# Patient Record
Sex: Female | Born: 1965 | Race: Black or African American | Hispanic: No | Smoking: Never smoker
Health system: Southern US, Community
[De-identification: ages and names within clinical notes are randomized; demographics above are authoritative.]

## PROBLEM LIST (undated history)

## (undated) DIAGNOSIS — R51 Headache: Secondary | ICD-10-CM

## (undated) DIAGNOSIS — I509 Heart failure, unspecified: Secondary | ICD-10-CM

## (undated) DIAGNOSIS — M1990 Unspecified osteoarthritis, unspecified site: Secondary | ICD-10-CM

## (undated) DIAGNOSIS — I10 Essential (primary) hypertension: Secondary | ICD-10-CM

## (undated) DIAGNOSIS — Z8619 Personal history of other infectious and parasitic diseases: Secondary | ICD-10-CM

## (undated) DIAGNOSIS — I82409 Acute embolism and thrombosis of unspecified deep veins of unspecified lower extremity: Secondary | ICD-10-CM

## (undated) HISTORY — DX: Headache: R51

## (undated) HISTORY — DX: Heart failure, unspecified: I50.9

## (undated) HISTORY — DX: Essential (primary) hypertension: I10

## (undated) HISTORY — DX: Acute embolism and thrombosis of unspecified deep veins of unspecified lower extremity: I82.409

## (undated) HISTORY — DX: Personal history of other infectious and parasitic diseases: Z86.19

## (undated) HISTORY — DX: Morbid (severe) obesity due to excess calories: E66.01

## (undated) HISTORY — DX: Unspecified osteoarthritis, unspecified site: M19.90

---
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
## 2007-05-09 ENCOUNTER — Emergency Department (HOSPITAL_COMMUNITY): Admission: EM | Admit: 2007-05-09 | Discharge: 2007-05-09 | Payer: Self-pay | Admitting: Emergency Medicine

## 2008-04-25 ENCOUNTER — Emergency Department (HOSPITAL_COMMUNITY): Admission: EM | Admit: 2008-04-25 | Discharge: 2008-04-26 | Payer: Self-pay | Admitting: Emergency Medicine

## 2008-10-11 ENCOUNTER — Inpatient Hospital Stay (HOSPITAL_COMMUNITY): Admission: EM | Admit: 2008-10-11 | Discharge: 2008-10-14 | Payer: Self-pay | Admitting: Emergency Medicine

## 2008-10-12 ENCOUNTER — Encounter (INDEPENDENT_AMBULATORY_CARE_PROVIDER_SITE_OTHER): Payer: Self-pay | Admitting: Internal Medicine

## 2008-10-12 ENCOUNTER — Ambulatory Visit: Payer: Self-pay | Admitting: *Deleted

## 2008-10-13 ENCOUNTER — Encounter (INDEPENDENT_AMBULATORY_CARE_PROVIDER_SITE_OTHER): Payer: Self-pay | Admitting: Internal Medicine

## 2009-10-29 ENCOUNTER — Encounter: Admission: RE | Admit: 2009-10-29 | Discharge: 2009-10-29 | Payer: Self-pay | Admitting: Internal Medicine

## 2010-09-25 LAB — HM PAP SMEAR

## 2010-10-15 ENCOUNTER — Encounter: Payer: Self-pay | Admitting: Neurosurgery

## 2010-10-16 ENCOUNTER — Encounter: Payer: Self-pay | Admitting: Physical Medicine and Rehabilitation

## 2011-01-09 LAB — LIPID PANEL
LDL Cholesterol: 104 mg/dL — ABNORMAL HIGH (ref 0–99)
Total CHOL/HDL Ratio: 4 RATIO
Triglycerides: 68 mg/dL (ref <150)
VLDL: 14 mg/dL (ref 0–40)

## 2011-01-09 LAB — DIFFERENTIAL
Basophils Absolute: 0.1 10*3/uL (ref 0.0–0.1)
Basophils Relative: 1 % (ref 0–1)
Eosinophils Absolute: 0 10*3/uL (ref 0.0–0.7)
Lymphs Abs: 1.8 10*3/uL (ref 0.7–4.0)
Monocytes Relative: 10 % (ref 3–12)
Monocytes Relative: 13 % — ABNORMAL HIGH (ref 3–12)
Neutro Abs: 2.7 10*3/uL (ref 1.7–7.7)
Neutro Abs: 3.7 10*3/uL (ref 1.7–7.7)
Neutrophils Relative %: 51 % (ref 43–77)
Neutrophils Relative %: 55 % (ref 43–77)

## 2011-01-09 LAB — CK: Total CK: 297 U/L — ABNORMAL HIGH (ref 7–177)

## 2011-01-09 LAB — CBC
MCHC: 33.5 g/dL (ref 30.0–36.0)
MCV: 85.5 fL (ref 78.0–100.0)
Platelets: 336 10*3/uL (ref 150–400)
RBC: 3.88 MIL/uL (ref 3.87–5.11)
RDW: 15 % (ref 11.5–15.5)
WBC: 5.3 10*3/uL (ref 4.0–10.5)

## 2011-01-09 LAB — BASIC METABOLIC PANEL
BUN: 6 mg/dL (ref 6–23)
CO2: 25 mEq/L (ref 19–32)
Chloride: 104 mEq/L (ref 96–112)
Chloride: 105 mEq/L (ref 96–112)
Creatinine, Ser: 0.76 mg/dL (ref 0.4–1.2)
GFR calc Af Amer: >60 mL/min (ref >60)
Glucose, Bld: 96 mg/dL (ref 70–99)
Potassium: 3.6 mEq/L (ref 3.5–5.1)
Sodium: 139 mEq/L (ref 135–145)

## 2011-01-09 LAB — POCT CARDIAC MARKERS
CKMB, poc: 3.4 ng/mL (ref 1.0–8.0)
CKMB, poc: 4.6 ng/mL (ref 1.0–8.0)
Myoglobin, poc: 137 ng/mL (ref 12–200)
Troponin i, poc: <0.05 ng/mL (ref 0.00–0.09)

## 2011-01-09 LAB — CARDIAC PANEL(CRET KIN+CKTOT+MB+TROPI)
CK, MB: 3.9 ng/mL (ref 0.3–4.0)
Total CK: 477 U/L — ABNORMAL HIGH (ref 7–177)

## 2011-01-09 LAB — HOMOCYSTEINE: Homocysteine: 10 umol/L (ref 4.0–15.4)

## 2011-01-09 LAB — CK TOTAL AND CKMB (NOT AT ARMC)
CK, MB: 7.2 ng/mL — ABNORMAL HIGH (ref 0.3–4.0)
Total CK: 461 U/L — ABNORMAL HIGH (ref 7–177)

## 2011-01-09 LAB — POCT I-STAT, CHEM 8
Calcium, Ion: 1.16 mmol/L (ref 1.12–1.32)
Creatinine, Ser: 1 mg/dL (ref 0.4–1.2)
Glucose, Bld: 95 mg/dL (ref 70–99)
Hemoglobin: 12.2 g/dL (ref 12.0–15.0)
Potassium: 3.1 mEq/L — ABNORMAL LOW (ref 3.5–5.1)

## 2011-01-09 LAB — SEDIMENTATION RATE: Sed Rate: 60 mm/hr — ABNORMAL HIGH (ref 0–22)

## 2011-01-09 LAB — PROTIME-INR: Prothrombin Time: 13.7 seconds (ref 11.6–15.2)

## 2011-02-07 NOTE — Discharge Summary (Signed)
[name], [name]                 ACCOUNT NO.:  [id number]   MEDICAL RECORD NO.:  [id number]          PATIENT TYPE:  INP   LOCATION:  3016                         FACILITY:  [facility]   PHYSICIAN:  Beckey Rutter, MD  DATE OF BIRTH:  03-Oct-1965   DATE OF ADMISSION:  10/11/2008  DATE OF DISCHARGE:  10/14/2008                               DISCHARGE SUMMARY   PRIMARY CARE PHYSICIAN:  Unassigned to [facility].   CHIEF COMPLAINT:  Headache and inability to control urine.   HOSPITAL COURSE AND WORKING DIAGNOSIS:  Transient ischemic attack.   HOSPITAL PROCEDURES:  Homocysteine is 10.0.  CK is 297.  The last CMET  was showing sodium 138, potassium 3.6, chloride 104, bicarb 29, glucose  96, BUN is 6, and creatinine is 0.77.  Her TSH as of October 11, 2008 is  1.2.  Lipid profile showing total cholesterol is 157, triglyceride is  68, HDL 39, and LDL is 104.   Carotid duplex is showing no ICA stenosis.  Vertebral artery showing  antegrade activity.  Chest x-ray on October 11, 2008 was showing mild  cardiomegaly, no active lung disease.   CT head on October 11, 2008, was showing no acute intracranial  abnormalities.   A 2-D echo read on October 13, 2008, by Dr. Lucas Mallow, impression is overall  left ventricular systolic function was vigorous.  Left ventricular  ejection fraction was estimated, it ranged between 65%-75%.  Although,  no diagnostic left ventricular regional wall motion abnormality was  identified, this possibility cannot be completely excluded on the basis  of the study.  Left ventricular wall thickness was mildly to moderately  increased.  The left atrium was mildly dilated.   HOSPITAL COURSE STAY:  1. TIA and history of loss of urine was investigated as above by CT      head, the carotid duplex, lipid profile, and the 2-D echo which all      are negative.  The patient would benefit from MRI but secondary to      her morbid obesity, the MRI could not be done while she was in  the      hospital, the MRI would need to be done with open MRI.  I had a      lengthy discussion about the need for the MRI and the need to      follow up with her primary physician.  2. Hypertension.  The patient has blood pressure control with Norvasc      5 mg, hydrochlorothiazide, and clonidine p.r.n.  I will discharge      her with Norvasc and hydrochlorothiazide with the possibility of      adding ACE inhibitor as needed to further control blood pressure if      it continued to be uncontrolled like the way it was prior to her      presentation.  Prescription was given.  3. Morbid obesity.  The patient was counseled.  She was encouraged to      lose weight and she might be referred to a registered dietitian for  further counseling or Obesity Clinic as needed.   DISCHARGE DIAGNOSES:  1. Rule out transient ischemic attack.  2. Hypertension.  3. Morbid obesity.   DISCHARGE MEDICATIONS:  1. Aspirin 325 mg p.o. daily.  2. Norvasc 10 mg p.o. daily.  3. Hydrochlorothiazide 25 mg p.o. daily.  4. Niacin 100 mg p.o. b.i.d.   Prescription was given for the above medication for 4-month supply.   DISCHARGE PLAN:  The patient is discharged to follow up with her primary  physician as discussed with her.  The patient would need an MRI which  she agreed to do it as an outpatient since she is feeling very good  currently, and she is clinically stable.  The patient has a history of  urinary incontinence together with obesity.  I would be concerned about  normal pressure hydrocephalus with her and hence the need for the MRI.  Also, we would need the MRI to complete the workup for TIA.  Again, the  patient preferred to do the MRI as an outpatient, and she is aware and  agreeable to this discharge plan.      Beckey Rutter, MD  Electronically Signed     EME/MEDQ  D:  10/14/2008  T:  10/15/2008  Job:  [id number]

## 2011-02-07 NOTE — H&P (Signed)
NAMEMIKENA, MASONER                 ACCOUNT NO.:  [id number]   MEDICAL RECORD NO.:  [id number]          PATIENT TYPE:  INP   LOCATION:  3016                         FACILITY:  [facility]   PHYSICIAN:  Della Goo, M.D. DATE OF BIRTH:  1966/05/06   DATE OF ADMISSION:  10/11/2008  DATE OF DISCHARGE:                              HISTORY & PHYSICAL   PRIMARY CARE PHYSICIAN:  None.   CHIEF COMPLAINT:  Headache, loss of urine.   HISTORY OF PRESENT ILLNESS:  This is a 45 year old female who presents  to the emergency department with complaints of severe headaches for the  past 3-4 days along with loss of urine which occurred while she was at  work.  The patient reports having no control.  She denies having any  loss of consciousness or tremor associated with this.  She does report  having some lightheadedness along with headache that she has had for the  past few days.  She denies having any fevers, chills, chest pain,  shortness of breath, nausea, vomiting, diarrhea or constipation.   In the emergency department, the patient was evaluated and she was found  to have a blood pressure of 176/102.  A CT scan of the head was  performed which was found to be negative for any acute intracranial  abnormality.   PAST MEDICAL HISTORY:  1. Hypertension.  2. Chronic back pain.  3. Degenerative disk disease.   MEDICATIONS:  Hydrochlorothiazide and amitriptyline.   ALLERGIES:  NO KNOWN DRUG ALLERGIES.   SOCIAL HISTORY:  The patient is a nonsmoker, nondrinker.   FAMILY HISTORY:  Positive for hypertensive disease.   REVIEW OF SYSTEMS:  Pertinents are mentioned above.   PHYSICAL EXAMINATION:  GENERAL:  This is a morbidly obese 45 year old  female in mild discomfort but no acute distress.  VITAL SIGNS:  Temperature 98.1, blood pressure 176/102, heart rate 102,  respirations 22.  O2 saturations 100% on 2 liters nasal cannula oxygen.  HEENT:  Normocephalic, atraumatic.  There is no scleral  icterus or  conjunctival erythema.  Pupils are equally round and reactive to light.  Extraocular movements are intact.  Nares are patent bilaterally.  Oropharynx is clear.  No exudates or erythema.  NECK:  Supple.  Full range of motion.  No thyromegaly, adenopathy or  jugulovenous distention.  CARDIOVASCULAR:  Regular rate and rhythm.  No  murmurs, gallops or rubs.  LUNGS:  Clear to auscultation bilaterally.  ABDOMEN:  Positive bowel sounds.  Soft, nontender, nondistended.  EXTREMITIES:  Without cyanosis, clubbing or edema.  NEUROLOGIC:  Alert and oriented x3.  Cranial nerves are intact.  Motor  and sensory function are also intact.  Cerebellar function intact and  deep tendon reflexes 2/4 throughout.   LABORATORY STUDIES:  White blood cell count 6.7, hemoglobin 11.4,  hematocrit 34.0, MCV 85.5, platelets 336, neutrophils 55%, lymphocytes  33%.  Chemistry with a sodium of 143, potassium 3.1, chloride 107,  bicarb 25, BUN 11, creatinine 1.0, glucose 95.  Protime 13.7, INR 1.0  and PTT 27.  Alcohol level less than 5.0.  Cardiac markers with a  myoglobin of 161, CK-MB 4.6 and troponin less than 0.05.  Erythrocyte  sedimentation rate 60.  Chest x-ray:  Mild cardiomegaly, but no active  lung disease changes present.  CT scan results negative for any acute  intracranial abnormality.   ASSESSMENT:  A 45 year old female being admitted with;  1. Transient ischemic attack.  2. Uncontrolled hypertension.  3. Hypokalemia secondary to medications.  4. Morbid obesity.   PLAN:  The patient will be admitted to a telemetry area and cardiac  enzymes will be performed.  A TIA workup will be started.  A urinalysis  will be requested.  The patient's potassium will be repleted and her  blood pressure medication will be further adjusted.  The patient will be  placed on DVT and GI prophylaxis.  Further workup will ensue pending  results of her studies and her clinical course.      Della Goo,  M.D.  Electronically Signed     HJ/MEDQ  D:  10/12/2008  T:  10/12/2008  Job:  [id number]

## 2011-07-10 LAB — URINE MICROSCOPIC-ADD ON

## 2011-07-10 LAB — URINALYSIS, ROUTINE W REFLEX MICROSCOPIC
Bilirubin Urine: NEGATIVE
Nitrite: POSITIVE — AB
Protein, ur: 100 — AB
Specific Gravity, Urine: 1.015
Urobilinogen, UA: 0.2

## 2012-07-22 ENCOUNTER — Ambulatory Visit (INDEPENDENT_AMBULATORY_CARE_PROVIDER_SITE_OTHER): Payer: 59 | Admitting: Internal Medicine

## 2012-07-22 ENCOUNTER — Encounter: Payer: Self-pay | Admitting: Internal Medicine

## 2012-07-22 VITALS — BP 204/112 | HR 92 | Temp 98.2°F | Ht 69.0 in | Wt 322.0 lb

## 2012-07-22 DIAGNOSIS — M25569 Pain in unspecified knee: Secondary | ICD-10-CM

## 2012-07-22 DIAGNOSIS — M545 Other chronic pain: Secondary | ICD-10-CM | POA: Insufficient documentation

## 2012-07-22 DIAGNOSIS — M170 Bilateral primary osteoarthritis of knee: Secondary | ICD-10-CM | POA: Insufficient documentation

## 2012-07-22 DIAGNOSIS — M25561 Pain in right knee: Secondary | ICD-10-CM | POA: Insufficient documentation

## 2012-07-22 DIAGNOSIS — M25579 Pain in unspecified ankle and joints of unspecified foot: Secondary | ICD-10-CM

## 2012-07-22 DIAGNOSIS — M1990 Unspecified osteoarthritis, unspecified site: Secondary | ICD-10-CM

## 2012-07-22 DIAGNOSIS — I10 Essential (primary) hypertension: Secondary | ICD-10-CM

## 2012-07-22 DIAGNOSIS — M25572 Pain in left ankle and joints of left foot: Secondary | ICD-10-CM | POA: Insufficient documentation

## 2012-07-22 DIAGNOSIS — R3589 Other polyuria: Secondary | ICD-10-CM | POA: Insufficient documentation

## 2012-07-22 DIAGNOSIS — R358 Other polyuria: Secondary | ICD-10-CM

## 2012-07-22 DIAGNOSIS — G8929 Other chronic pain: Secondary | ICD-10-CM

## 2012-07-22 LAB — CBC WITH DIFFERENTIAL/PLATELET
Basophils Relative: 0.5 % (ref 0.0–3.0)
Eosinophils Absolute: 0 10*3/uL (ref 0.0–0.7)
Eosinophils Relative: 0.6 % (ref 0.0–5.0)
Hemoglobin: 12.6 g/dL (ref 12.0–15.0)
Lymphocytes Relative: 48.5 % — ABNORMAL HIGH (ref 12.0–46.0)
MCHC: 32.7 g/dL (ref 30.0–36.0)
Monocytes Relative: 11 % (ref 3.0–12.0)
Neutro Abs: 1.9 10*3/uL (ref 1.4–7.7)
RBC: 4.28 Mil/uL (ref 3.87–5.11)

## 2012-07-22 LAB — BASIC METABOLIC PANEL
CO2: 28 mEq/L (ref 19–32)
Calcium: 8.9 mg/dL (ref 8.4–10.5)
Sodium: 140 mEq/L (ref 135–145)

## 2012-07-22 LAB — TSH: TSH: 1.6 u[IU]/mL (ref 0.35–5.50)

## 2012-07-22 LAB — HEPATIC FUNCTION PANEL
ALT: 10 U/L (ref 0–35)
AST: 17 U/L (ref 0–37)
Alkaline Phosphatase: 81 U/L (ref 39–117)
Bilirubin, Direct: 0.1 mg/dL (ref 0.0–0.3)
Total Protein: 7.5 g/dL (ref 6.0–8.3)

## 2012-07-22 LAB — T4, FREE: Free T4: 0.92 ng/dL (ref 0.60–1.60)

## 2012-07-22 LAB — LIPID PANEL: Total CHOL/HDL Ratio: 3

## 2012-07-22 MED ORDER — LABETALOL HCL 300 MG PO TABS: 300.0000 mg | ORAL_TABLET | Freq: Two times a day (BID) | ORAL | Status: DC

## 2012-07-22 MED ORDER — HYDRALAZINE HCL 25 MG PO TABS: 25.0000 mg | ORAL_TABLET | Freq: Three times a day (TID) | ORAL | Status: DC

## 2012-07-22 MED ORDER — AMLODIPINE-OLMESARTAN 5-20 MG PO TABS: 1.0000 | ORAL_TABLET | Freq: Every day | ORAL | Status: DC

## 2012-07-22 MED ORDER — POTASSIUM CHLORIDE CRYS ER 20 MEQ PO TBCR: 20.0000 meq | EXTENDED_RELEASE_TABLET | Freq: Every day | ORAL | Status: DC

## 2012-07-22 MED ORDER — FUROSEMIDE 40 MG PO TABS: 40.0000 mg | ORAL_TABLET | Freq: Every day | ORAL | Status: DC

## 2012-07-22 MED ORDER — MORPHINE SULFATE ER 30 MG PO TBCR: 30.0000 mg | EXTENDED_RELEASE_TABLET | Freq: Two times a day (BID) | ORAL | Status: DC

## 2012-07-22 NOTE — Assessment & Plan Note (Signed)
Rule out diabetes.  Check A1c.

## 2012-07-22 NOTE — Assessment & Plan Note (Signed)
Poor control despite multiple medications. Decrease hydralazine to 25 mg 3 times daily. This is the likely agent causing symptoms of significant fatigue. Increase labetalol to 300 mg twice daily. Continue furosemide. Add Azor 20/5 mg once daily.  Rule out secondary hypertension.  Obtain renal artery doppler.  BP: 204/112 mmHg

## 2012-07-22 NOTE — Assessment & Plan Note (Signed)
Patient has chronic low back pain secondary to degenerative disc disease. This was managed by her primary care physician with oxycodone. Refer to pain management specialist. I would like patient to be weaned off her narcotics.  Switch to oramorph sr 30 mg twice daily for now.

## 2012-07-22 NOTE — Patient Instructions (Signed)
Do not start Azor until our office contacts you re: blood test results.

## 2012-07-22 NOTE — Assessment & Plan Note (Signed)
Patient tried to pursue weight loss surgery in the past surgeries not covered by insurance company. Discussed weight loss strategies. Goal weight loss - 10 % of body weight over next 6 months.

## 2012-07-22 NOTE — Progress Notes (Signed)
Subjective:    Patient ID: [name], female    DOB: August 14, 1966, 46 y.o.   MRN: [id number]  HPI  46 year old white female with history of uncontrolled hypertension, morbid obesity and chronic low back pain to establish. She was previously followed by a primary care physician in [facility]. Patient reports difficulty controlling her blood pressures. She is currently on multiple medications including hydralazine, labetalol and furosemide.  She denies history of diabetes or coronary artery disease. Patient does report she may have had a mild stroke in 2010.  Patient also complains of chronic degenerative joint disease of her right knee and left ankle. She was diagnosed with significant arthritis in her left ankle one year ago at a walk-in clinic. She has chronic history of left foot swelling. She has pain in her medial aspect of left ankle.  She also has history of chronic low back pain. Previous MRI showed degenerative disc disease at L4 and L5. She also is noted to have mild spinal stenosis. Her primary care physician has been managing her pain with oxycodone 30 mg every 4-6 hours as needed. Patient reports she has developed a tolerance to her pain medications.  She has history of morbid obesity. She try to pursue weight loss surgery but was not covered by her current insurance.    Review of Systems  Constitutional: Negative for activity change, appetite change and unexpected weight change.  Eyes: Negative for visual disturbance.  Respiratory: Negative for cough.  Occasional dyspnea with exertion Cardiovascular: Negative for chest pain.  Genitourinary: Negative for difficulty urinating.  Neurological: Negative for headaches.  Gastrointestinal: Negative for abdominal pain, heartburn melena or hematochezia Psych: Negative for depression or anxiety Endo:  Positive for polyuria  Past Medical History  Diagnosis Date  . History of chicken pox   . Headache   . Hypertension   .  Osteoarthritis   . Morbid obesity     History   Social History  . Marital Status: Divorced    Spouse Name: N/A    Number of Children: N/A  . 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 Smoking status: Never Smoker   . Smokeless tobacco: Never Used  . Alcohol Use: No  . Drug Use: No  . Sexually Active: Not on file   Other Topics Concern  . Not on file   Social History Narrative   Works at Naval architect for [employer]    Past Surgical History  Procedure Date  . Tubal ligation 1985    Family History  Problem Relation Age of Onset  . Arthritis Mother   . Prostate cancer Maternal Grandfather     breast, prostate  . Stroke    . Hypertension Mother     No Known Allergies  Current Outpatient Prescriptions on File Prior to Visit  Medication Sig Dispense Refill  . furosemide (LASIX) 40 MG tablet Take 1 tablet (40 mg total) by mouth daily.  90 tablet  0  . hydrALAZINE (APRESOLINE) 25 MG tablet Take 1 tablet (25 mg total) by mouth 3 (three) times daily.  90 tablet  1  . labetalol (NORMODYNE) 300 MG tablet Take 1 tablet (300 mg total) by mouth 2 (two) times daily.  60 tablet  1  . potassium chloride SA (K-DUR,KLOR-CON) 20 MEQ tablet Take 1 tablet (20 mEq total) by mouth daily.  90 tablet  0  . amLODipine-olmesartan (AZOR) 5-20 MG per  tablet Take 1 tablet by mouth daily.  30 tablet  0    BP 204/112  Pulse 92  Temp 98.2 F (36.8 C) (Oral)  Ht 5\' 9"  (1.753 m)  Wt 322 lb (146.058 kg)  BMI 47.55 kg/m2  LMP 05/28/2012        Objective:   Physical Exam  Constitutional: She is oriented to person, place, and time.       Pleasant, morbidly obese 46 year old African American female  HENT:  Head: Normocephalic and atraumatic.  Right Ear: External ear normal.  Left Ear: External ear normal.  Mouth/Throat: Oropharynx is clear and moist.       Hearing is grossly normal  Eyes: EOM are normal. Pupils  are equal, round, and reactive to light.  Neck: Neck supple. No thyromegaly present.       No carotid bruit  Cardiovascular: Normal rate, regular rhythm, normal heart sounds and intact distal pulses.   No murmur heard. Pulmonary/Chest: Effort normal and breath sounds normal. She has no wheezes.  Abdominal: Soft. Bowel sounds are normal. She exhibits no mass.       Abdominal obesity  Musculoskeletal:       Discomfort with flexion and extension of right knee, left ankle swelling, pain with plantar flexion and dorsiflexion of the left ankle. Tenderness of left ankle near medial malleolus  Lymphadenopathy:    She has no cervical adenopathy.  Neurological: She is alert and oriented to person, place, and time. No cranial nerve deficit.  Skin: Skin is warm and dry.  Psychiatric: She has a normal mood and affect. Her behavior is normal.          Assessment & Plan:

## 2012-07-22 NOTE — Assessment & Plan Note (Signed)
Patient has history of degenerative joint disease of her right knee and left ankle. Her symptoms are progressively worsening. She has chronic swelling of her left ankle. Refer to orthopedics for further evaluation and treatment. I discussed the importance of weight loss.

## 2012-07-29 ENCOUNTER — Encounter: Payer: Self-pay | Admitting: Physical Medicine and Rehabilitation

## 2012-08-06 ENCOUNTER — Encounter: Payer: Self-pay | Admitting: Internal Medicine

## 2012-08-06 ENCOUNTER — Ambulatory Visit (INDEPENDENT_AMBULATORY_CARE_PROVIDER_SITE_OTHER): Payer: 59 | Admitting: Internal Medicine

## 2012-08-06 VITALS — BP 162/90 | HR 84 | Temp 99.7°F | Wt 317.0 lb

## 2012-08-06 DIAGNOSIS — M545 Low back pain, unspecified: Secondary | ICD-10-CM

## 2012-08-06 DIAGNOSIS — G8929 Other chronic pain: Secondary | ICD-10-CM

## 2012-08-06 DIAGNOSIS — I10 Essential (primary) hypertension: Secondary | ICD-10-CM

## 2012-08-06 LAB — BASIC METABOLIC PANEL
Calcium: 9.1 mg/dL (ref 8.4–10.5)
Creatinine, Ser: 0.8 mg/dL (ref 0.4–1.2)
GFR: 97.4 mL/min (ref >60.00)
Sodium: 137 mEq/L (ref 135–145)

## 2012-08-06 MED ORDER — OXYCODONE HCL 30 MG PO TABS: 30.0000 mg | ORAL_TABLET | Freq: Three times a day (TID) | ORAL | Status: DC | PRN

## 2012-08-06 MED ORDER — AMLODIPINE-OLMESARTAN 10-40 MG PO TABS: 1.0000 | ORAL_TABLET | Freq: Every day | ORAL | Status: DC

## 2012-08-06 NOTE — Progress Notes (Signed)
Subjective:    Patient ID: [name], female    DOB: 1966/05/04, 46 y.o.   MRN: [id number]  HPI  46 year old African American female with severe hypertension and chronic low back pain for follow up. Patient reports since starting morphine sulfate she has had significant nausea. She is also complains of abdominal cramps and diarrhea.  She is tolerating Azor without difficulty. Her blood pressure is somewhat lower. Her previous blood work reviewed.  Lab Results  Component Value Date   NA 140 07/22/2012   K 3.5 07/22/2012   CL 106 07/22/2012   CO2 28 07/22/2012   Lab Results  Component Value Date   CREATININE 0.8 07/22/2012     Review of Systems Nausea, abdominal cramps, and back pain  Past Medical History  Diagnosis Date  . History of chicken pox   . Headache   . Hypertension   . Osteoarthritis   . Morbid obesity     History   Social History  . Marital Status: Divorced    Spouse Name: N/A    Number of Children: N/A  . 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 Smoking status: Never Smoker   . Smokeless tobacco: Never Used  . Alcohol Use: No  . Drug Use: No  . Sexually Active: Not on file   Other Topics Concern  . Not on file   Social History Narrative   Works at Naval architect for [employer]    Past Surgical History  Procedure Date  . Tubal ligation 1985    Family History  Problem Relation Age of Onset  . Arthritis Mother   . Prostate cancer Maternal Grandfather     breast, prostate  . Stroke    . Hypertension Mother     No Known Allergies  Current Outpatient Prescriptions on File Prior to Visit  Medication Sig Dispense Refill  . ALPRAZolam (XANAX XR) 0.5 MG 24 hr tablet Take 0.5 mg by mouth 2 (two) times daily as needed.      . furosemide (LASIX) 40 MG tablet Take 1 tablet (40 mg total) by mouth daily.  90 tablet  0  . hydrALAZINE (APRESOLINE) 25 MG tablet Take 1  tablet (25 mg total) by mouth 3 (three) times daily.  90 tablet  1  . labetalol (NORMODYNE) 300 MG tablet Take 1 tablet (300 mg total) by mouth 2 (two) times daily.  60 tablet  1  . nystatin cream (MYCOSTATIN) Apply 1 application topically as needed.      . potassium chloride SA (K-DUR,KLOR-CON) 20 MEQ tablet Take 1 tablet (20 mEq total) by mouth daily.  90 tablet  0  . triamcinolone (KENALOG) 0.025 % cream Apply 1 application topically 2 (two) times daily.        BP 162/90  Pulse 84  Temp 99.7 F (37.6 C) (Oral)  Wt 317 lb (143.79 kg)  LMP 05/28/2012       Objective:   Physical Exam  Constitutional: She is oriented to person, place, and time. She appears well-developed and well-nourished. No distress.  Cardiovascular: Normal rate, regular rhythm and normal heart sounds.   Pulmonary/Chest: Effort normal and breath sounds normal. She has no wheezes.  Musculoskeletal: She exhibits no edema.  Neurological: She is alert and oriented to person, place, and time. No cranial nerve deficit.  Skin: Skin is warm and dry.  Psychiatric: She has a normal mood and affect.  Her behavior is normal.          Assessment & Plan:

## 2012-08-06 NOTE — Assessment & Plan Note (Signed)
Patient having nausea and abdominal cramps from morphine.  DC morphine.  Resume oxycodone 30 mg tid prn until she is seen by pain mgt specialist.

## 2012-08-06 NOTE — Assessment & Plan Note (Signed)
Improved. Monitor renal function. Awaiting renal artery Doppler. Increased Azor dose to 10/40.  Reassess in 2 weeks. BP: 162/90 mmHg

## 2012-08-14 ENCOUNTER — Encounter: Payer: 59 | Admitting: Physical Medicine and Rehabilitation

## 2012-08-20 ENCOUNTER — Ambulatory Visit: Payer: 59 | Admitting: Internal Medicine

## 2012-08-26 ENCOUNTER — Telehealth: Payer: Self-pay | Admitting: Family Medicine

## 2012-08-26 NOTE — Telephone Encounter (Signed)
Azor is an excluded product under this patient's insurance plan - they will not cover. Please advise.

## 2012-08-26 NOTE — Telephone Encounter (Signed)
Change to benicar 40 mg and amlodipine 10 mg.

## 2012-08-27 MED ORDER — AMLODIPINE BESYLATE 10 MG PO TABS: 10.0000 mg | ORAL_TABLET | Freq: Every day | ORAL | Status: DC

## 2012-08-27 MED ORDER — OLMESARTAN MEDOXOMIL 40 MG PO TABS: 40.0000 mg | ORAL_TABLET | Freq: Every day | ORAL | Status: DC

## 2012-08-27 NOTE — Telephone Encounter (Signed)
rx sent in electronically 

## 2012-08-29 ENCOUNTER — Ambulatory Visit (INDEPENDENT_AMBULATORY_CARE_PROVIDER_SITE_OTHER): Payer: 59 | Admitting: Internal Medicine

## 2012-08-29 ENCOUNTER — Encounter: Payer: Self-pay | Admitting: Internal Medicine

## 2012-08-29 VITALS — BP 160/98 | HR 96 | Temp 98.3°F | Wt 315.0 lb

## 2012-08-29 DIAGNOSIS — I10 Essential (primary) hypertension: Secondary | ICD-10-CM

## 2012-08-29 LAB — BASIC METABOLIC PANEL
Chloride: 104 mEq/L (ref 96–112)
Creatinine, Ser: 0.8 mg/dL (ref 0.4–1.2)
GFR: 93.38 mL/min (ref >60.00)
Potassium: 3.8 mEq/L (ref 3.5–5.1)

## 2012-08-29 MED ORDER — NEBIVOLOL HCL 10 MG PO TABS: 10.0000 mg | ORAL_TABLET | Freq: Every day | ORAL | Status: DC

## 2012-08-29 NOTE — Progress Notes (Signed)
Subjective:    Patient ID: [name], female    DOB: [birth year], 46 y.o.   MRN: [id number]  HPI  46 year old African American female with severe hypertension and chronic low back pain for followup. At previous visit patient was started on Benicar 40 mg in addition to her other antihypertensives. Patient denies any adverse effect. Her blood pressure is improved but still suboptimal.  Patient often forgets to take her second dose of labetalol.  Her nausea or resolved after stopping morphine. She has followup appointment with pain management specialist next week.  Review of Systems Negative for chest pain or shortness of breath  Past Medical History  Diagnosis Date  . History of chicken pox   . Headache   . Hypertension   . Osteoarthritis   . Morbid obesity     History   Social History  . Marital Status: Divorced    Spouse Name: N/A    Number of Children: N/A  . 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 Smoking status: Never Smoker   . Smokeless tobacco: Never Used  . Alcohol Use: No  . Drug Use: No  . Sexually Active: Not on file   Other Topics Concern  . Not on file   Social History Narrative   Works at Naval architect for [employer]    Past Surgical History  Procedure Date  . Tubal ligation 1985    Family History  Problem Relation Age of Onset  . Arthritis Mother   . Prostate cancer Maternal Grandfather     breast, prostate  . Stroke    . Hypertension Mother     No Known Allergies  Current Outpatient Prescriptions on File Prior to Visit  Medication Sig Dispense Refill  . ALPRAZolam (XANAX XR) 0.5 MG 24 hr tablet Take 0.5 mg by mouth 2 (two) times daily as needed.      Marland Kitchen amLODipine (NORVASC) 10 MG tablet Take 1 tablet (10 mg total) by mouth daily.  90 tablet  1  . furosemide (LASIX) 40 MG tablet Take 1 tablet (40 mg total) by mouth daily.  90 tablet  0  . hydrALAZINE  (APRESOLINE) 25 MG tablet Take 1 tablet (25 mg total) by mouth 3 (three) times daily.  90 tablet  1  . nystatin cream (MYCOSTATIN) Apply 1 application topically as needed.      Marland Kitchen olmesartan (BENICAR) 40 MG tablet Take 1 tablet (40 mg total) by mouth daily.  90 tablet  1  . oxycodone (ROXICODONE) 30 MG immediate release tablet Take 1 tablet (30 mg total) by mouth every 8 (eight) hours as needed for pain.  90 tablet  0  . potassium chloride SA (K-DUR,KLOR-CON) 20 MEQ tablet Take 1 tablet (20 mEq total) by mouth daily.  90 tablet  0  . triamcinolone (KENALOG) 0.025 % cream Apply 1 application topically 2 (two) times daily.      . nebivolol (BYSTOLIC) 10 MG tablet Take 1 tablet (10 mg total) by mouth daily.  30 tablet  2    BP 160/98  Pulse 96  Temp 98.3 F (36.8 C) (Oral)  Wt 315 lb (142.883 kg)       Objective:   Physical Exam  Constitutional: She appears well-developed and well-nourished.  Cardiovascular: Normal rate, regular rhythm and normal heart sounds.   Pulmonary/Chest: Effort normal and breath sounds normal. She has no wheezes.  Assessment & Plan:

## 2012-08-29 NOTE — Assessment & Plan Note (Signed)
Blood pressure control is still suboptimal. She often forgets to take her second dose of labetalol. Discontinue labetalol. Switch to Bystolic 10 mg once daily. BP: 160/98 mmHg  Monitor electrolytes and kidney function.  I stressed importance of adherence to low salt diet.

## 2012-09-02 ENCOUNTER — Encounter: Payer: Self-pay | Admitting: *Deleted

## 2012-09-04 ENCOUNTER — Encounter: Payer: 59 | Attending: Physical Medicine and Rehabilitation | Admitting: Physical Medicine and Rehabilitation

## 2012-09-04 ENCOUNTER — Telehealth: Payer: Self-pay | Admitting: Internal Medicine

## 2012-09-04 ENCOUNTER — Encounter: Payer: Self-pay | Admitting: Physical Medicine and Rehabilitation

## 2012-09-04 VITALS — BP 194/98 | HR 107 | Resp 16 | Ht 69.0 in | Wt 315.0 lb

## 2012-09-04 DIAGNOSIS — M1710 Unilateral primary osteoarthritis, unspecified knee: Secondary | ICD-10-CM

## 2012-09-04 DIAGNOSIS — M549 Dorsalgia, unspecified: Secondary | ICD-10-CM

## 2012-09-04 DIAGNOSIS — G8929 Other chronic pain: Secondary | ICD-10-CM | POA: Insufficient documentation

## 2012-09-04 DIAGNOSIS — M51379 Other intervertebral disc degeneration, lumbosacral region without mention of lumbar back pain or lower extremity pain: Secondary | ICD-10-CM | POA: Insufficient documentation

## 2012-09-04 DIAGNOSIS — Z5181 Encounter for therapeutic drug level monitoring: Secondary | ICD-10-CM

## 2012-09-04 DIAGNOSIS — M1711 Unilateral primary osteoarthritis, right knee: Secondary | ICD-10-CM

## 2012-09-04 DIAGNOSIS — M5137 Other intervertebral disc degeneration, lumbosacral region: Secondary | ICD-10-CM | POA: Insufficient documentation

## 2012-09-04 DIAGNOSIS — M25561 Pain in right knee: Secondary | ICD-10-CM

## 2012-09-04 DIAGNOSIS — M545 Low back pain, unspecified: Secondary | ICD-10-CM

## 2012-09-04 DIAGNOSIS — M25569 Pain in unspecified knee: Secondary | ICD-10-CM | POA: Insufficient documentation

## 2012-09-04 MED ORDER — OXYCODONE HCL 30 MG PO TABS: 30.0000 mg | ORAL_TABLET | Freq: Three times a day (TID) | ORAL | Status: DC | PRN

## 2012-09-04 NOTE — Telephone Encounter (Signed)
Done and pt called

## 2012-09-04 NOTE — Telephone Encounter (Signed)
Please print rx for signature.  Pt will need to pick up rx

## 2012-09-04 NOTE — Patient Instructions (Addendum)
We have discussed your back pain today. I understand you'll be seeing orthopedics for your right knee.  I am ordering x-rays of your back.  I am ordering physical therapy for you. I would like the emphasis to be on education on proper body mechanics and posture as well as pacing. I would like them to help you get an exercise program to assist you in strengthening your core muscles.   We also have discussed some non-narcotic treatment options to help you with your back pain.  I have given you a brochure on low back pain and on medial branch neurotomy  Back Pain, Adult Low back pain is very common. About 1 in 5 people have back pain.The cause of low back pain is rarely dangerous. The pain often gets better over time.About half of people with a sudden onset of back pain feel better in just 2 weeks. About 8 in 10 people feel better by 6 weeks.  CAUSES Some common causes of back pain include:  Strain of the muscles or ligaments supporting the spine.  Wear and tear (degeneration) of the spinal discs.  Arthritis.  Direct injury to the back. DIAGNOSIS Most of the time, the direct cause of low back pain is not known.However, back pain can be treated effectively even when the exact cause of the pain is unknown.Answering your caregiver's questions about your overall health and symptoms is one of the most accurate ways to make sure the cause of your pain is not dangerous. If your caregiver needs more information, he or she may order lab work or imaging tests (X-rays or MRIs).However, even if imaging tests show changes in your back, this usually does not require surgery. HOME CARE INSTRUCTIONS For many people, back pain returns.Since low back pain is rarely dangerous, it is often a condition that people can learn to [facility] their own.   Remain active. It is stressful on the back to sit or stand in one place. Do not sit, drive, or stand in one place for more than 30 minutes at a time. Take short  walks on level surfaces as soon as pain allows.Try to increase the length of time you walk each day.  Do not stay in bed.Resting more than 1 or 2 days can delay your recovery.  Do not avoid exercise or work.Your body is made to move.It is not dangerous to be active, even though your back may hurt.Your back will likely heal faster if you return to being active before your pain is gone.  Pay attention to your body when you bend and lift. Many people have less discomfortwhen lifting if they bend their knees, keep the load close to their bodies,and avoid twisting. Often, the most comfortable positions are those that put less stress on your recovering back.  Find a comfortable position to sleep. Use a firm mattress and lie on your side with your knees slightly bent. If you lie on your back, put a pillow under your knees.  Only take over-the-counter or prescription medicines as directed by your caregiver. Over-the-counter medicines to reduce pain and inflammation are often the most helpful.Your caregiver may prescribe muscle relaxant drugs.These medicines help dull your pain so you can more quickly return to your normal activities and healthy exercise.  Put ice on the injured area.  Put ice in a plastic bag.  Place a towel between your skin and the bag.  Leave the ice on for 15 to 20 minutes, 3 to 4 times a day for the  first 2 to 3 days. After that, ice and heat may be alternated to reduce pain and spasms.  Ask your caregiver about trying back exercises and gentle massage. This may be of some benefit.  Avoid feeling anxious or stressed.Stress increases muscle tension and can worsen back pain.It is important to recognize when you are anxious or stressed and learn ways to manage it.Exercise is a great option. SEEK MEDICAL CARE IF:  You have pain that is not relieved with rest or medicine.  You have pain that does not improve in 1 week.  You have new symptoms.  You are generally  not feeling well. SEEK IMMEDIATE MEDICAL CARE IF:   You have pain that radiates from your back into your legs.  You develop new bowel or bladder control problems.  You have unusual weakness or numbness in your arms or legs.  You develop nausea or vomiting.  You develop abdominal pain.  You feel faint. Document Released: 09/11/2005 Document Revised: 03/12/2012 Document Reviewed: 01/30/2011 [facility] Patient Information 2013 [place], [place].       Back Injury Prevention Back injuries can be extremely painful and difficult to heal. After having one back injury, you are much more likely to experience another later on. It is important to learn how to avoid injuring or re-injuring your back. The following tips can help you to prevent a back injury. PHYSICAL FITNESS  Exercise regularly and try to develop good tone in your abdominal muscles. Your abdominal muscles provide a lot of the support needed by your back.  Do aerobic exercises (walking, jogging, biking, swimming) regularly.  Do exercises that increase balance and strength (tai chi, yoga) regularly. This can decrease your risk of falling and injuring your back.  Stretch before and after exercising.  Maintain a healthy weight. The more you weigh, the more stress is placed on your back. For every pound of weight, 10 times that amount of pressure is placed on the back. DIET  Talk to your caregiver about how much calcium and vitamin D you need per day. These nutrients help to prevent weakening of the bones (osteoporosis). Osteoporosis can cause broken (fractured) bones that lead to back pain.  Include good sources of calcium in your diet, such as dairy products, green, leafy vegetables, and products with calcium added (fortified).  Include good sources of vitamin D in your diet, such as milk and foods that are fortified with vitamin D.  Consider taking a nutritional supplement or a multivitamin if needed.  Stop smoking if you  smoke. POSTURE  Sit and stand up straight. Avoid leaning forward when you sit or hunching over when you stand.  Choose chairs with good low back (lumbar) support.  If you work at a desk, sit close to your work so you do not need to lean over. Keep your chin tucked in. Keep your neck drawn back and elbows bent at a right angle. Your arms should look like the letter "L."  Sit high and close to the steering wheel when you drive. Add a lumbar support to your car seat if needed.  Avoid sitting or standing in one position for too long. Take breaks to get up, stretch, and walk around at least once every hour. Take breaks if you are driving for long periods of time.  Sleep on your side with your knees slightly bent, or sleep on your back with a pillow under your knees. Do not sleep on your stomach. LIFTING, TWISTING, AND REACHING  Avoid heavy lifting,  especially repetitive lifting. If you must do heavy lifting:  Stretch before lifting.  Work slowly.  Rest between lifts.  Use carts and dollies to move objects when possible.  Make several small trips instead of carrying 1 heavy load.  Ask for help when you need it.  Ask for help when moving big, awkward objects.  Follow these steps when lifting:  Stand with your feet shoulder-width apart.  Get as close to the object as you can. Do not try to pick up heavy objects that are far from your body.  Use handles or lifting straps if they are available.  Bend at your knees. Squat down, but keep your heels off the floor.  Keep your shoulders pulled back, your chin tucked in, and your back straight.  Lift the object slowly, tightening the muscles in your legs, abdomen, and buttocks. Keep the object as close to the center of your body as possible.  When you put a load down, use these same guidelines in reverse.  Do not:  Lift the object above your waist.  Twist at the waist while lifting or carrying a load. Move your feet if you need to  turn, not your waist.  Bend over without bending at your knees.  Avoid reaching over your head, across a table, or for an object on a high surface. OTHER TIPS  Avoid wet floors and keep sidewalks clear of ice to prevent falls.  Do not sleep on a mattress that is too soft or too hard.  Keep items that are used frequently within easy reach.  Put heavier objects on shelves at waist level and lighter objects on lower or higher shelves.  Find ways to decrease your stress, such as exercise, massage, or relaxation techniques. Stress can build up in your muscles. Tense muscles are more vulnerable to injury.  Seek treatment for depression or anxiety if needed. These conditions can increase your risk of developing back pain. SEEK MEDICAL CARE IF:  You injure your back.  You have questions about diet, exercise, or other ways to prevent back injuries. MAKE SURE YOU:  Understand these instructions.  Will watch your condition.  Will get help right away if you are not doing well or get worse. Document Released: 10/19/2004 Document Revised: 12/04/2011 Document Reviewed: 10/23/2011 [facility] Patient Information 2013 [place], [place].    Follow up in 3-4 weeks

## 2012-09-04 NOTE — Telephone Encounter (Signed)
Patient called stating that she need a refill of her oxycodone 30mg  1po every 8hrs. Please assist.

## 2012-09-04 NOTE — Telephone Encounter (Signed)
Patient called back to check status

## 2012-09-04 NOTE — Progress Notes (Signed)
Subjective:    Patient ID: [name], female    DOB: 06/05/1966, 46 y.o.   MRN: [id number]  HPI  The patient is a 46 year old woman who is kindly referred by Dr. Cleatis Polka. The patient has 2 main pain complaints which include bilateral knee pain right greater than left which had a gradual onset 3-4 years ago, she tells me her recent x-rays at [facility] orthopedics showed bone on bone arthritis and she tells me she has been referred to Dr. Charlann Boxer.   She would like me to focus on her low back pain today. Her back pain came on gradually in 2007. It is described as being located in the center of the low back. It is described as sharp burning dull stabbing and aching in nature. Pain is worse in the a.m. and toward the end of the day and is worse with standing and walking improves with rest and medication.  She does not have leg numbness or tingling. She denies problems with bowel or bladder.  She tells me she has been seen at [facility] where she had an MRI years ago (2009). She saw Dr. Newell Coral who apparently has reviewed the old MRI and wanted to order a new MRI but this Colson felt it was too expensive and did not want to have it done. She was not interested in a surgical option at that time. She did undergo lumbar injections several times which she states did not help much.  She has been using oxycodone at least since 2012. She has been on oxycodone 30 mg-180 pills per month and was recently reduced to oxycodone 30 mg 3 times a day. She had some withdrawal but states that she would like to get off of narcotics.  She has not had any physical therapy, education on body mechanics.  She states that tramadol and Naprosyn and Mobic do not work.  She is working as a Lobbyist.  She admits to recent marijuana use.( in last 2 weeks)   Pain Inventory Average Pain 10 Pain Right Now 8 My pain is sharp, burning, dull, stabbing and aching  In the last 24 hours, has pain interfered with the  following? General activity 10 Relation with others 10 Enjoyment of life 10 What TIME of day is your pain at its worst? morning and night Sleep (in general) Fair  Pain is worse with: walking, inactivity and standing Pain improves with: rest and medication Relief from Meds: 7  Mobility walk without assistance ability to climb steps?  yes do you drive?  yes transfers alone Do you have any goals in this area?  yes  Function employed # of hrs/week 40 Do you have any goals in this area?  yes  Neuro/Psych tingling anxiety  Prior Studies x-rays CT/MRI  Physicians involved in your care Any changes since last visit?  no   Family History  Problem Relation Age of Onset  . Arthritis Mother   . Prostate cancer Maternal Grandfather     breast, prostate  . Stroke    . Hypertension Mother    History   Social History  . Marital Status: Divorced    Spouse Name: N/A    Number of Children: N/A  . 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 Smoking status: Never Smoker   . Smokeless tobacco: Never Used  . Alcohol Use: No  . Drug Use: No  . Sexually  Active: None   Other Topics Concern  . None   Social History Narrative   Works at Naval architect for [employer]   Past Surgical History  Procedure Date  . Tubal ligation 1985   Past Medical History  Diagnosis Date  . History of chicken pox   . Headache   . Hypertension   . Osteoarthritis   . Morbid obesity    BP 194/98  Pulse 107  Resp 16  Ht 5\' 9"  (1.753 m)  Wt 315 lb (142.883 kg)  BMI 46.52 kg/m2  SpO2 99%  LMP 08/23/2012    Review of Systems  Musculoskeletal: Positive for back pain.  Neurological:       Tingling  Psychiatric/Behavioral: The patient is nervous/anxious.   All other systems reviewed and are negative.       Objective:   Physical Exam The patient is an obese [place] American female who does not appear in any  distress  She is oriented x3 her speech is clear her affect is bright she's alert cooperative and pleasant she follows commands without difficulty and answers my questions appropriately  Cranial nerves and coordination are grossly intact  Focused exam:  Lower extremity reflexes are 1+ at patellar and Achilles tendons  Sensation is intact to light touch and pinprick in both lower extremities  Motor strength is good 5/5 at hip flexors knee extensors dorsiflexors plantar flexors EHL and everters knee flexors  She transitions from sitting to standing without difficulty. Gait is notable for significant sidebending in an attempt to unweight her right knee.  Balance is good Romberg's test performed adequately  Limitations are noted in lumbar motion with forward flexion as well as extension. Complaints of pain especially with extension.  Mild tenderness in lumbar paraspinal musculature noted  Right knee briefly evaluated excess tissue makes it difficult to assess effusion. No obvious deformity noted  Tenderness along the medial joint line on the right knee is noted relatively well preserved range of motion noted.       Assessment & Plan:  Chronic low back pain may be combination of degenerative disc as well as facet mediated component. We'll consider some non-narcotic options to include physical therapy with an emphasis on pain management, education on proper body mechanics and posture, core strengthening. Right knee pain is altering her gait significantly in may be affecting her low back pain as well. She will be following up with Dr. Charlann Boxer for this. Recommend weight loss. May consider TENS unit.  I also discussed consideration of medial branch blocks to target facet mediated pain. She seems to be open to this. Would like to obtain x-rays of her low back prior.  UDS pending but Pt admits to use of illegal substance, non narcotic management per [facility].  Lumbar xrays ordered. Physical  therapy ordered.  Patient info on back pain and back care included.  Brochure on back pain and medial branch blocks given to patient I also spent time going over spine model and answered her questions.  Will f/u in 3-4 weeks.

## 2012-10-03 ENCOUNTER — Other Ambulatory Visit: Payer: Self-pay | Admitting: Internal Medicine

## 2012-10-03 MED ORDER — OXYCODONE HCL 30 MG PO TABS: 30.0000 mg | ORAL_TABLET | Freq: Three times a day (TID) | ORAL | Status: DC | PRN

## 2012-10-03 NOTE — Telephone Encounter (Signed)
Ok to refill x 1 month

## 2012-10-03 NOTE — Telephone Encounter (Signed)
Pt does not go back to see her till the end of the month.  She's going to be out on Sunday.  Pt has an appt with you next Friday

## 2012-10-03 NOTE — Telephone Encounter (Signed)
Is Dr. Pamelia Hoit going to take over prescribing pain meds?

## 2012-10-03 NOTE — Telephone Encounter (Signed)
rx up front ready for p/u, Dr Pamelia Hoit will need to write rx from here on out.  Left a note in envelope for pt stating that

## 2012-10-03 NOTE — Telephone Encounter (Signed)
Pt needs new rx oxycodone 30 mg. Pt is due on sunday

## 2012-10-04 ENCOUNTER — Other Ambulatory Visit (HOSPITAL_COMMUNITY): Payer: 59

## 2012-10-11 ENCOUNTER — Ambulatory Visit: Payer: 59 | Admitting: Internal Medicine

## 2012-10-16 ENCOUNTER — Encounter: Payer: 59 | Attending: Physical Medicine and Rehabilitation | Admitting: Physical Medicine and Rehabilitation

## 2012-10-16 DIAGNOSIS — M51379 Other intervertebral disc degeneration, lumbosacral region without mention of lumbar back pain or lower extremity pain: Secondary | ICD-10-CM | POA: Insufficient documentation

## 2012-10-16 DIAGNOSIS — M545 Low back pain, unspecified: Secondary | ICD-10-CM | POA: Insufficient documentation

## 2012-10-16 DIAGNOSIS — M5137 Other intervertebral disc degeneration, lumbosacral region: Secondary | ICD-10-CM | POA: Insufficient documentation

## 2012-10-16 DIAGNOSIS — M25569 Pain in unspecified knee: Secondary | ICD-10-CM | POA: Insufficient documentation

## 2012-10-16 DIAGNOSIS — G8929 Other chronic pain: Secondary | ICD-10-CM | POA: Insufficient documentation

## 2012-10-25 ENCOUNTER — Ambulatory Visit (INDEPENDENT_AMBULATORY_CARE_PROVIDER_SITE_OTHER): Payer: 59 | Admitting: Internal Medicine

## 2012-10-25 ENCOUNTER — Encounter: Payer: Self-pay | Admitting: Internal Medicine

## 2012-10-25 VITALS — BP 160/90 | HR 88 | Temp 98.8°F | Wt 319.0 lb

## 2012-10-25 DIAGNOSIS — G8929 Other chronic pain: Secondary | ICD-10-CM

## 2012-10-25 DIAGNOSIS — M545 Low back pain, unspecified: Secondary | ICD-10-CM

## 2012-10-25 DIAGNOSIS — M25561 Pain in right knee: Secondary | ICD-10-CM

## 2012-10-25 DIAGNOSIS — I10 Essential (primary) hypertension: Secondary | ICD-10-CM

## 2012-10-25 DIAGNOSIS — M25569 Pain in unspecified knee: Secondary | ICD-10-CM

## 2012-10-25 MED ORDER — OXYCODONE HCL 30 MG PO TABS: 30.0000 mg | ORAL_TABLET | Freq: Three times a day (TID) | ORAL | Status: DC | PRN

## 2012-10-25 MED ORDER — NEBIVOLOL HCL 20 MG PO TABS: 20.0000 mg | ORAL_TABLET | Freq: Every day | ORAL | Status: DC

## 2012-10-25 NOTE — Assessment & Plan Note (Signed)
Blood pressure still suboptimally controlled. Increase Bystolic 20 mg once daily.  Continue other anti hypertensive at same dose. BP: 160/90 mmHg

## 2012-10-25 NOTE — Assessment & Plan Note (Signed)
Patient has had difficulty scheduling followup appointment with pain management specialist. Prescription for oxycodone 30 mg to take tid provided.

## 2012-10-25 NOTE — Progress Notes (Signed)
Subjective:    Patient ID: [name], female    DOB: May 25, 1966, 47 y.o.   MRN: [id number]  HPI  47 year old African American female for followup regarding hypertension and chronic low back pain. Patient seen by pain management specialist. Unfortunately, she has not been able to schedule followup appointment yet. Pain management specialist has agreed to treat her chronic low back pain. She also has severe osteoarthritis of right knee and is planning on having right knee replacement.  Her low back pain is currently maintained on oxycodone 30 mg 3 times a day.  Hypertension-patient reports good medication compliance. She took all her medications today. Her home blood pressure readings are usually in the 160s systolic and 90s diastolic  Review of Systems Negative for chest pain or shortness of breath  Past Medical History  Diagnosis Date  . History of chicken pox   . Headache   . Hypertension   . Osteoarthritis   . Morbid obesity     History   Social History  . Marital Status: Divorced    Spouse Name: N/A    Number of Children: N/A  . 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 Smoking status: Never Smoker   . Smokeless tobacco: Never Used  . Alcohol Use: No  . Drug Use: No  . Sexually Active: Not on file   Other Topics Concern  . Not on file   Social History Narrative   Works at Naval architect for [employer]    Past Surgical History  Procedure Date  . Tubal ligation 1985    Family History  Problem Relation Age of Onset  . Arthritis Mother   . Prostate cancer Maternal Grandfather     breast, prostate  . Stroke    . Hypertension Mother     No Known Allergies  Current Outpatient Prescriptions on File Prior to Visit  Medication Sig Dispense Refill  . ALPRAZolam (XANAX XR) 0.5 MG 24 hr tablet Take 0.5 mg by mouth 2 (two) times daily as needed.      Marland Kitchen amLODipine (NORVASC) 10 MG tablet  Take 1 tablet (10 mg total) by mouth daily.  90 tablet  1  . furosemide (LASIX) 40 MG tablet Take 1 tablet (40 mg total) by mouth daily.  90 tablet  0  . hydrALAZINE (APRESOLINE) 25 MG tablet Take 1 tablet (25 mg total) by mouth 3 (three) times daily.  90 tablet  1  . Nebivolol HCl (BYSTOLIC) 20 MG TABS Take 1 tablet (20 mg total) by mouth daily.  90 tablet  1  . nystatin cream (MYCOSTATIN) Apply 1 application topically as needed.      Marland Kitchen olmesartan (BENICAR) 40 MG tablet Take 1 tablet (40 mg total) by mouth daily.  90 tablet  1  . potassium chloride SA (K-DUR,KLOR-CON) 20 MEQ tablet Take 1 tablet (20 mEq total) by mouth daily.  90 tablet  0  . triamcinolone (KENALOG) 0.025 % cream Apply 1 application topically 2 (two) times daily.        BP 160/90  Pulse 88  Temp 98.8 F (37.1 C) (Oral)  Wt 319 lb (144.697 kg)       Objective:   Physical Exam  Constitutional: She is oriented to person, place, and time. She appears well-developed and well-nourished.  HENT:  Head: Normocephalic and atraumatic.  Cardiovascular: Normal rate, regular rhythm and normal heart sounds.   Pulmonary/Chest:  Effort normal and breath sounds normal. She has no wheezes.  Musculoskeletal: She exhibits no edema.  Neurological: She is alert and oriented to person, place, and time.  Psychiatric: She has a normal mood and affect. Her behavior is normal.          Assessment & Plan:

## 2012-10-25 NOTE — Patient Instructions (Addendum)
Please complete the following lab tests before your next follow up appointment: BMET - 401.9 

## 2012-10-25 NOTE — Assessment & Plan Note (Signed)
Patient reports seeing orthopedic specialist. Patient reportedly has end-stage degenerative joint disease of right knee. She plans to followup with orthopedic specialist to consider total knee replacement.

## 2012-11-08 ENCOUNTER — Encounter: Payer: 59 | Admitting: Physical Medicine and Rehabilitation

## 2012-11-28 ENCOUNTER — Telehealth: Payer: Self-pay | Admitting: Internal Medicine

## 2012-11-28 NOTE — Telephone Encounter (Signed)
Patient called stating that she need a refill of her oxycodone 30mg  1po q 8  Hrs prn for pain. Please assist.

## 2012-12-02 MED ORDER — OXYCODONE HCL 30 MG PO TABS: 30.0000 mg | ORAL_TABLET | Freq: Three times a day (TID) | ORAL | Status: DC | PRN

## 2012-12-02 NOTE — Telephone Encounter (Signed)
rx up front ready for p/u, pt aware she needs to make an OV

## 2012-12-02 NOTE — Telephone Encounter (Signed)
Ok to refill x 1.  Needs OV for additional refills

## 2012-12-02 NOTE — Telephone Encounter (Signed)
Patient called stating that she is now completely out of her meds please assist with previous request.

## 2012-12-06 ENCOUNTER — Ambulatory Visit (INDEPENDENT_AMBULATORY_CARE_PROVIDER_SITE_OTHER): Payer: 59 | Admitting: Internal Medicine

## 2012-12-06 ENCOUNTER — Encounter: Payer: Self-pay | Admitting: Internal Medicine

## 2012-12-06 VITALS — BP 178/104 | HR 68 | Temp 98.0°F | Wt 326.0 lb

## 2012-12-06 DIAGNOSIS — G8929 Other chronic pain: Secondary | ICD-10-CM

## 2012-12-06 DIAGNOSIS — M545 Low back pain, unspecified: Secondary | ICD-10-CM

## 2012-12-06 DIAGNOSIS — I10 Essential (primary) hypertension: Secondary | ICD-10-CM

## 2012-12-06 MED ORDER — OXYCODONE HCL 30 MG PO TABS: 30.0000 mg | ORAL_TABLET | Freq: Three times a day (TID) | ORAL | Status: DC | PRN

## 2012-12-06 MED ORDER — LABETALOL HCL 200 MG PO TABS: 200.0000 mg | ORAL_TABLET | Freq: Two times a day (BID) | ORAL | Status: DC

## 2012-12-06 MED ORDER — OLMESARTAN-AMLODIPINE-HCTZ 40-10-25 MG PO TABS: 1.0000 | ORAL_TABLET | Freq: Every day | ORAL | Status: DC

## 2012-12-06 NOTE — Progress Notes (Signed)
Subjective:    Patient ID: [name], female    DOB: 07-21-1966, 47 y.o.   MRN: [id number]  HPI  47 year old African American female for followup regarding severe hypertension and chronic low back pain. Patient reports taking all her medications on a regular basis. However blood pressure is still poorly controlled.   Patient not sure she'll be able to go back to see pain management specialist due to financial reasons. Patient reports her current pain level is the same on oxycodone 30 mg 3 times a day. He medication also his helps her chronic right knee pain. She is unable to have right knee replacement due to financial reasons.  Review of Systems Weight gain, intermittent headaches  Past Medical History  Diagnosis Date  . History of chicken pox   . Headache   . Hypertension   . Osteoarthritis   . Morbid obesity     History   Social History  . Marital Status: Divorced    Spouse Name: N/A    Number of Children: N/A  . 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 Smoking status: Never Smoker   . Smokeless tobacco: Never Used  . Alcohol Use: No  . Drug Use: No  . Sexually Active: Not on file   Other Topics Concern  . Not on file   Social History Narrative   Works at Naval architect for [employer]    Past Surgical History  Procedure Laterality Date  . Tubal ligation  1985    Family History  Problem Relation Age of Onset  . Arthritis Mother   . Prostate cancer Maternal Grandfather     breast, prostate  . Stroke    . Hypertension Mother     No Known Allergies  Current Outpatient Prescriptions on File Prior to Visit  Medication Sig Dispense Refill  . ALPRAZolam (XANAX XR) 0.5 MG 24 hr tablet Take 0.5 mg by mouth 2 (two) times daily as needed.      Marland Kitchen amLODipine (NORVASC) 10 MG tablet Take 1 tablet (10 mg total) by mouth daily.  90 tablet  1  . furosemide (LASIX) 40 MG tablet Take 1 tablet  (40 mg total) by mouth daily.  90 tablet  0  . hydrALAZINE (APRESOLINE) 25 MG tablet Take 1 tablet (25 mg total) by mouth 3 (three) times daily.  90 tablet  1  . Nebivolol HCl (BYSTOLIC) 20 MG TABS Take 1 tablet (20 mg total) by mouth daily.  90 tablet  1  . nystatin cream (MYCOSTATIN) Apply 1 application topically as needed.      Marland Kitchen olmesartan (BENICAR) 40 MG tablet Take 1 tablet (40 mg total) by mouth daily.  90 tablet  1  . potassium chloride SA (K-DUR,KLOR-CON) 20 MEQ tablet Take 1 tablet (20 mEq total) by mouth daily.  90 tablet  0  . triamcinolone (KENALOG) 0.025 % cream Apply 1 application topically 2 (two) times daily.       No current facility-administered medications on file prior to visit.    BP 178/104  Pulse 68  Temp(Src) 98 F (36.7 C)  Wt 326 lb (147.873 kg)  BMI 48.12 kg/m2       Objective:   Physical Exam  Constitutional: She is oriented to person, place, and time. She appears well-developed and well-nourished.  HENT:  Head: Normocephalic and atraumatic.  Cardiovascular: Normal rate, regular rhythm and normal heart sounds.  Pulmonary/Chest: Effort normal and breath sounds normal. She has no wheezes.  Musculoskeletal: She exhibits no edema.  Neurological: She is alert and oriented to person, place, and time. No cranial nerve deficit.  Skin: Skin is warm and dry.  Psychiatric: She has a normal mood and affect. Her behavior is normal.          Assessment & Plan:

## 2012-12-06 NOTE — Assessment & Plan Note (Signed)
Patient unable to followup with pain management specialist due to financial reasons. Continue oxycodone 30 mg 3 times a day. Refer to physical therapy for TENS unit. We discussed possibly decreasing oxycodone dose to 30 mg twice a day depending on her response to TENs unit.

## 2012-12-06 NOTE — Assessment & Plan Note (Signed)
Patient's blood pressure still poorly controlled despite high doses of multiple antihypertensives. Simplify blood pressure medication regimen.  Discontinue Benicar, amlodipine, and Lasix. Switch to try Tribenzor 40/10/25 mg once daily. Discontinue bystolic. Switch labetalol 200 mg twice daily.  If BP still uncontrolled, proceed with work up for secondary hypertension. BMET before next office visit. BP: 178/104 mmHg (large cuff)

## 2012-12-17 ENCOUNTER — Telehealth: Payer: Self-pay | Admitting: Internal Medicine

## 2012-12-17 NOTE — Telephone Encounter (Signed)
Tried to call patient but unable to leave a message because voice mail is full.  Will try again at a later time

## 2012-12-17 NOTE — Telephone Encounter (Signed)
Please see if her insurance will cover Exforge HCT

## 2012-12-17 NOTE — Telephone Encounter (Signed)
PT insurance will not cover Tribenzor. This medication is excluded under her plan.

## 2012-12-20 NOTE — Telephone Encounter (Signed)
Mailbox full

## 2012-12-23 NOTE — Telephone Encounter (Signed)
Mailbox full

## 2012-12-24 MED ORDER — AMLODIPINE-VALSARTAN-HCTZ 10-320-25 MG PO TABS: 1.0000 | ORAL_TABLET | Freq: Every day | ORAL | Status: DC

## 2012-12-24 NOTE — Telephone Encounter (Signed)
rx sent in electronically 

## 2012-12-24 NOTE — Addendum Note (Signed)
Addended by: Alfred Levins D on: 12/24/2012 01:45 PM   Modules accepted: Orders, Medications

## 2012-12-24 NOTE — Telephone Encounter (Signed)
Call in Exforge HCT 320/10/25 mg #90 one po qd. RF x 1

## 2012-12-25 ENCOUNTER — Encounter: Payer: 59 | Attending: Physical Medicine and Rehabilitation | Admitting: Physical Medicine and Rehabilitation

## 2012-12-25 DIAGNOSIS — M51379 Other intervertebral disc degeneration, lumbosacral region without mention of lumbar back pain or lower extremity pain: Secondary | ICD-10-CM | POA: Insufficient documentation

## 2012-12-25 DIAGNOSIS — M545 Low back pain, unspecified: Secondary | ICD-10-CM | POA: Insufficient documentation

## 2012-12-25 DIAGNOSIS — M25569 Pain in unspecified knee: Secondary | ICD-10-CM | POA: Insufficient documentation

## 2012-12-25 DIAGNOSIS — G8929 Other chronic pain: Secondary | ICD-10-CM | POA: Insufficient documentation

## 2012-12-25 DIAGNOSIS — M5137 Other intervertebral disc degeneration, lumbosacral region: Secondary | ICD-10-CM | POA: Insufficient documentation

## 2012-12-26 ENCOUNTER — Encounter: Payer: Self-pay | Admitting: Internal Medicine

## 2012-12-27 ENCOUNTER — Other Ambulatory Visit (INDEPENDENT_AMBULATORY_CARE_PROVIDER_SITE_OTHER): Payer: 59

## 2012-12-27 DIAGNOSIS — I10 Essential (primary) hypertension: Secondary | ICD-10-CM

## 2012-12-27 LAB — BASIC METABOLIC PANEL
BUN: 9 mg/dL (ref 6–23)
Creatinine, Ser: 0.8 mg/dL (ref 0.4–1.2)
GFR: 104.66 mL/min (ref >60.00)

## 2013-01-03 ENCOUNTER — Ambulatory Visit (INDEPENDENT_AMBULATORY_CARE_PROVIDER_SITE_OTHER): Payer: 59 | Admitting: Internal Medicine

## 2013-01-03 ENCOUNTER — Ambulatory Visit (INDEPENDENT_AMBULATORY_CARE_PROVIDER_SITE_OTHER)
Admission: RE | Admit: 2013-01-03 | Discharge: 2013-01-03 | Disposition: A | Discharge status: Home / Self Care | Payer: 59 | Source: Ambulatory Visit | Attending: Internal Medicine | Admitting: Internal Medicine

## 2013-01-03 ENCOUNTER — Encounter: Payer: Self-pay | Admitting: Internal Medicine

## 2013-01-03 ENCOUNTER — Telehealth: Payer: Self-pay | Admitting: Internal Medicine

## 2013-01-03 VITALS — BP 142/94 | HR 92 | Temp 98.7°F | Wt 316.0 lb

## 2013-01-03 DIAGNOSIS — R0602 Shortness of breath: Secondary | ICD-10-CM

## 2013-01-03 DIAGNOSIS — Z1231 Encounter for screening mammogram for malignant neoplasm of breast: Secondary | ICD-10-CM

## 2013-01-03 DIAGNOSIS — I10 Essential (primary) hypertension: Secondary | ICD-10-CM

## 2013-01-03 DIAGNOSIS — G8929 Other chronic pain: Secondary | ICD-10-CM

## 2013-01-03 DIAGNOSIS — M545 Low back pain, unspecified: Secondary | ICD-10-CM

## 2013-01-03 DIAGNOSIS — Z0000 Encounter for general adult medical examination without abnormal findings: Secondary | ICD-10-CM

## 2013-01-03 DIAGNOSIS — R0789 Other chest pain: Secondary | ICD-10-CM | POA: Insufficient documentation

## 2013-01-03 DIAGNOSIS — R079 Chest pain, unspecified: Secondary | ICD-10-CM

## 2013-01-03 LAB — CBC WITH DIFFERENTIAL/PLATELET
Eosinophils Relative: 0.4 % (ref 0.0–5.0)
Lymphocytes Relative: 46.5 % — ABNORMAL HIGH (ref 12.0–46.0)
Monocytes Relative: 7.7 % (ref 3.0–12.0)
Neutrophils Relative %: 45 % (ref 43.0–77.0)
Platelets: 253 10*3/uL (ref 150.0–400.0)
WBC: 6.7 10*3/uL (ref 4.5–10.5)

## 2013-01-03 LAB — D-DIMER, QUANTITATIVE: D-Dimer, Quant: 0.48 ug/mL-FEU (ref 0.00–0.48)

## 2013-01-03 MED ORDER — OXYCODONE HCL 30 MG PO TABS: 30.0000 mg | ORAL_TABLET | Freq: Three times a day (TID) | ORAL | Status: DC | PRN

## 2013-01-03 NOTE — Telephone Encounter (Signed)
While pt was checking out, she stated that she generally always comes in every 6 weeks for a fu. Her records do reflect that, but Dr. Artist Pais didn't put those instructions on her note today. If I need to call and bring her back in 6 weeks, please let me know.

## 2013-01-03 NOTE — Progress Notes (Signed)
Subjective:    Patient ID: [name], female    DOB: 01/07/1966, 47 y.o.   MRN: [id number]  HPI  47 year old African American female with history of difficult to control hypertension and chronic low back pain complains of intermittent right-sided chest pain over last 1 week. Her symptoms started last Monday. She denies any specific injury but has been doing more housework than usual. Sometimes her pain in her right-sided chest radiates to her back. It seems to be worse with movement. Her symptoms are nonexertional.  Patient also complains of intermittent shortness of breath.   Review of Systems Negative for reflux symptoms, she complains of lower ext edema Left > Right  Past Medical History  Diagnosis Date  . History of chicken pox   . Headache   . Hypertension   . Osteoarthritis   . Morbid obesity     History   Social History  . Marital Status: Divorced    Spouse Name: N/A    Number of Children: N/A  . 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 Smoking status: Never Smoker   . Smokeless tobacco: Never Used  . Alcohol Use: No  . Drug Use: No  . Sexually Active: Not on file   Other Topics Concern  . Not on file   Social History Narrative   Works at Naval architect for [employer]    Past Surgical History  Procedure Laterality Date  . Tubal ligation  1985    Family History  Problem Relation Age of Onset  . Arthritis Mother   . Prostate cancer Maternal Grandfather     breast, prostate  . Stroke    . Hypertension Mother     No Known Allergies  Current Outpatient Prescriptions on File Prior to Visit  Medication Sig Dispense Refill  . ALPRAZolam (XANAX XR) 0.5 MG 24 hr tablet Take 0.5 mg by mouth 2 (two) times daily as needed.      . Amlodipine-Valsartan-HCTZ 10-320-25 MG TABS Take 1 tablet by mouth daily.  90 tablet  1  . hydrALAZINE (APRESOLINE) 25 MG tablet Take 1 tablet (25 mg  total) by mouth 3 (three) times daily.  90 tablet  1  . labetalol (NORMODYNE) 200 MG tablet Take 1 tablet (200 mg total) by mouth 2 (two) times daily.  180 tablet  1  . nystatin cream (MYCOSTATIN) Apply 1 application topically as needed.      . potassium chloride SA (K-DUR,KLOR-CON) 20 MEQ tablet Take 1 tablet (20 mEq total) by mouth daily.  90 tablet  0  . triamcinolone (KENALOG) 0.025 % cream Apply 1 application topically 2 (two) times daily.       No current facility-administered medications on file prior to visit.    BP 142/94  Pulse 92  Temp(Src) 98.7 F (37.1 C) (Oral)  Wt 316 lb (143.337 kg)  BMI 46.64 kg/m2  LMP 12/17/2012  EKG shows normal sinus rhythm at 94 BPM. No ST changes     Objective:   Physical Exam  Constitutional: She is oriented to person, place, and time. She appears well-developed and well-nourished.  HENT:  Head: Normocephalic and atraumatic.  Neck: Neck supple.  Cardiovascular: Normal rate, regular rhythm and normal heart sounds.   Pulmonary/Chest: Effort normal and breath sounds normal. She has no wheezes.  Mid / right chest wall tenderness  Abdominal: Soft. Bowel sounds are normal. She exhibits no mass. There  is no tenderness.  Musculoskeletal: She exhibits edema.  Lymphadenopathy:    She has no cervical adenopathy.  Neurological: She is alert and oriented to person, place, and time. No cranial nerve deficit.  Psychiatric: She has a normal mood and affect. Her behavior is normal.          Assessment & Plan:

## 2013-01-03 NOTE — Assessment & Plan Note (Signed)
47 year old African American female complains of nonexertional right-sided chest pain that radiates to her back. She has intermittent shortness of breath. Her EKG is unremarkable. I suspect musculoskeletal etiology. Check CBCD, D-dimer, and chest x ray.  Patient understands to seek emergency medical care should her symptoms worsen. Reassess in 2 weeks.

## 2013-01-03 NOTE — Patient Instructions (Addendum)
Please contact our office if your symptoms do not improve or gets worse. If your chest pain or shortness of breath gets worse, proceed to emergency room for further evaluation

## 2013-01-03 NOTE — Assessment & Plan Note (Signed)
Good response to ExForge Hct.  BP: 142/94 mmHg

## 2013-01-03 NOTE — Telephone Encounter (Signed)
Yes for pain management and pt needs to sign a pain management contract.

## 2013-01-04 LAB — DRUG SCREEN, URINE
Amphetamine Screen, Ur: NEGATIVE
Barbiturate Quant, Ur: NEGATIVE
Cocaine Metabolites: NEGATIVE
Opiates: POSITIVE — AB

## 2013-01-06 LAB — BRAIN NATRIURETIC PEPTIDE: Pro B Natriuretic peptide (BNP): 27 pg/mL (ref 0.0–100.0)

## 2013-01-07 ENCOUNTER — Encounter: Payer: Self-pay | Admitting: *Deleted

## 2013-01-07 NOTE — Telephone Encounter (Signed)
Letter mailed

## 2013-01-07 NOTE — Telephone Encounter (Signed)
[name] - Exforge denied - her insurance does not cover that medication. The patient needs to call her insurance: [employer], and find our what drugs in the same class they DO cover. I called her to let her know, but her mailbox is full. She will, however, received a copy of the denial, so she may call in.

## 2013-01-07 NOTE — Telephone Encounter (Signed)
LMOVM 4/15

## 2013-01-17 ENCOUNTER — Ambulatory Visit
Admission: RE | Admit: 2013-01-17 | Discharge: 2013-01-17 | Disposition: A | Discharge status: Home / Self Care | Payer: 59 | Source: Ambulatory Visit | Attending: Internal Medicine | Admitting: Internal Medicine

## 2013-01-17 ENCOUNTER — Ambulatory Visit: Payer: 59

## 2013-01-17 DIAGNOSIS — Z1231 Encounter for screening mammogram for malignant neoplasm of breast: Secondary | ICD-10-CM

## 2013-03-18 ENCOUNTER — Telehealth: Payer: Self-pay | Admitting: Internal Medicine

## 2013-03-18 MED ORDER — OXYCODONE HCL 30 MG PO TABS: 30.0000 mg | ORAL_TABLET | Freq: Three times a day (TID) | ORAL | Status: DC | PRN

## 2013-03-18 NOTE — Telephone Encounter (Signed)
Dr Clent Ridges signed the rx,

## 2013-03-18 NOTE — Telephone Encounter (Signed)
Pt needs refill of oxycodone (ROXICODONE) 30 MG immediate release tablet.

## 2013-03-18 NOTE — Telephone Encounter (Signed)
Mailbox full

## 2013-03-18 NOTE — Telephone Encounter (Signed)
Refill x 1 only.  Please see if one of the other providers willing to sign rx.  She well need visit before additional refills.  Don't inform pt but she will need to complete UDS at next OV

## 2013-03-19 NOTE — Telephone Encounter (Signed)
Pt aware. Appt set 7/18

## 2013-04-11 ENCOUNTER — Ambulatory Visit: Payer: 59 | Admitting: Internal Medicine

## 2013-04-11 ENCOUNTER — Ambulatory Visit (INDEPENDENT_AMBULATORY_CARE_PROVIDER_SITE_OTHER): Payer: 59 | Admitting: Internal Medicine

## 2013-04-11 ENCOUNTER — Encounter: Payer: Self-pay | Admitting: Internal Medicine

## 2013-04-11 VITALS — BP 154/90 | Temp 99.6°F | Wt 322.0 lb

## 2013-04-11 DIAGNOSIS — M1990 Unspecified osteoarthritis, unspecified site: Secondary | ICD-10-CM

## 2013-04-11 DIAGNOSIS — I10 Essential (primary) hypertension: Secondary | ICD-10-CM

## 2013-04-11 MED ORDER — HYDROCHLOROTHIAZIDE 25 MG PO TABS: 25.0000 mg | ORAL_TABLET | Freq: Every day | ORAL | Status: DC

## 2013-04-11 MED ORDER — OXYCODONE HCL 30 MG PO TABS: 30.0000 mg | ORAL_TABLET | Freq: Three times a day (TID) | ORAL | Status: DC | PRN

## 2013-04-11 MED ORDER — HYDRALAZINE HCL 25 MG PO TABS: 25.0000 mg | ORAL_TABLET | Freq: Three times a day (TID) | ORAL | Status: DC

## 2013-04-11 MED ORDER — LABETALOL HCL 200 MG PO TABS: 200.0000 mg | ORAL_TABLET | Freq: Two times a day (BID) | ORAL | Status: DC

## 2013-04-11 MED ORDER — ALPRAZOLAM ER 0.5 MG PO TB24: 0.5000 mg | ORAL_TABLET | Freq: Two times a day (BID) | ORAL | Status: DC | PRN

## 2013-04-11 MED ORDER — POTASSIUM CHLORIDE CRYS ER 20 MEQ PO TBCR: 20.0000 meq | EXTENDED_RELEASE_TABLET | Freq: Every day | ORAL | Status: DC

## 2013-04-11 MED ORDER — AMLODIPINE-VALSARTAN-HCTZ 10-320-25 MG PO TABS: 1.0000 | ORAL_TABLET | Freq: Every day | ORAL | Status: DC

## 2013-04-11 NOTE — Progress Notes (Signed)
Subjective:    Patient ID: [name], female    DOB: 28-Jul-1966, 47 y.o.   MRN: [id number]  HPI  47 year old patient who is seen today for followup. She has a history of multi-drug-resistant hypertension morbid obesity osteoarthritis with chronic knee and back pain. She has been compliant with her medications. No new concerns or complaints.  Past Medical History  Diagnosis Date  . History of chicken pox   . Headache(784.0)   . Hypertension   . Osteoarthritis   . Morbid obesity     History   Social History  . Marital Status: Divorced    Spouse Name: N/A    Number of Children: N/A  . 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 Smoking status: Never Smoker   . Smokeless tobacco: Never Used  . Alcohol Use: No  . Drug Use: No  . Sexually Active: Not on file   Other Topics Concern  . Not on file   Social History Narrative   Works at Naval architect for [employer]    Past Surgical History  Procedure Laterality Date  . Tubal ligation  1985    Family History  Problem Relation Age of Onset  . Arthritis Mother   . Prostate cancer Maternal Grandfather     breast, prostate  . Stroke    . Hypertension Mother     No Known Allergies  Current Outpatient Prescriptions on File Prior to Visit  Medication Sig Dispense Refill  . ALPRAZolam (XANAX XR) 0.5 MG 24 hr tablet Take 0.5 mg by mouth 2 (two) times daily as needed.      . Amlodipine-Valsartan-HCTZ 10-320-25 MG TABS Take 1 tablet by mouth daily.  90 tablet  1  . hydrALAZINE (APRESOLINE) 25 MG tablet Take 1 tablet (25 mg total) by mouth 3 (three) times daily.  90 tablet  1  . labetalol (NORMODYNE) 200 MG tablet Take 1 tablet (200 mg total) by mouth 2 (two) times daily.  180 tablet  1  . oxycodone (ROXICODONE) 30 MG immediate release tablet Take 1 tablet (30 mg total) by mouth every 8 (eight) hours as needed for pain.  90 tablet  0  . potassium chloride  SA (K-DUR,KLOR-CON) 20 MEQ tablet Take 1 tablet (20 mEq total) by mouth daily.  90 tablet  0   No current facility-administered medications on file prior to visit.    BP 154/90  Temp(Src) 99.6 F (37.6 C) (Oral)  Wt 322 lb (146.058 kg)  BMI 47.53 kg/m2       Review of Systems  Constitutional: Negative.   HENT: Negative for hearing loss, congestion, sore throat, rhinorrhea, dental problem, sinus pressure and tinnitus.   Eyes: Negative for pain, discharge and visual disturbance.  Respiratory: Negative for cough and shortness of breath.   Cardiovascular: Negative for chest pain, palpitations and leg swelling.  Gastrointestinal: Negative for nausea, vomiting, abdominal pain, diarrhea, constipation, blood in stool and abdominal distention.  Genitourinary: Negative for dysuria, urgency, frequency, hematuria, flank pain, vaginal bleeding, vaginal discharge, difficulty urinating, vaginal pain and pelvic pain.  Musculoskeletal: Positive for back pain and arthralgias. Negative for joint swelling and gait problem.  Skin: Negative for rash.  Neurological: Negative for dizziness, syncope, speech difficulty, weakness, numbness and headaches.  Hematological: Negative for adenopathy.  Psychiatric/Behavioral: Negative for behavioral problems, dysphoric mood and agitation. The patient is not nervous/anxious.        Objective:  Physical Exam  Constitutional: She is oriented to person, place, and time. She appears well-developed and well-nourished.  Blood pressure 150/80 Weight 322  HENT:  Head: Normocephalic.  Right Ear: External ear normal.  Left Ear: External ear normal.  Mouth/Throat: Oropharynx is clear and moist.  Eyes: Conjunctivae and EOM are normal. Pupils are equal, round, and reactive to light.  Neck: Normal range of motion. Neck supple. No thyromegaly present.  Cardiovascular: Normal rate, regular rhythm, normal heart sounds and intact distal pulses.   Pulmonary/Chest: Effort  normal and breath sounds normal.  Abdominal: Soft. Bowel sounds are normal. She exhibits no mass. There is no tenderness.  Musculoskeletal: Normal range of motion.  Lymphadenopathy:    She has no cervical adenopathy.  Neurological: She is alert and oriented to person, place, and time.  Skin: Skin is warm and dry. No rash noted.  Psychiatric: She has a normal mood and affect. Her behavior is normal.          Assessment & Plan:   Hypertension. Reasonable control. We'll continue present multidrug regimen. Low-salt diet weight loss exercise all encouraged Morbid obesity Osteoarthritis  All medications refilled

## 2013-04-11 NOTE — Patient Instructions (Signed)
Limit your sodium (Salt) intake  Please check your blood pressure on a regular basis.  If it is consistently greater than 150/90, please make an office appointment.  You need to lose weight.  Consider a lower calorie diet and regular exercise.    It is important that you exercise regularly, at least 20 minutes 3 to 4 times per week.  If you develop chest pain or shortness of breath seek  medical attention. 

## 2013-05-06 ENCOUNTER — Telehealth: Payer: Self-pay | Admitting: Internal Medicine

## 2013-05-06 NOTE — Telephone Encounter (Signed)
PT is calling to request a refill of her oxycodone (ROXICODONE) 30 MG immediate release tablet. Please assist.

## 2013-05-07 ENCOUNTER — Telehealth: Payer: Self-pay | Admitting: Family Medicine

## 2013-05-07 ENCOUNTER — Other Ambulatory Visit: Payer: Self-pay | Admitting: Family Medicine

## 2013-05-07 DIAGNOSIS — R3589 Other polyuria: Secondary | ICD-10-CM

## 2013-05-07 DIAGNOSIS — R358 Other polyuria: Secondary | ICD-10-CM

## 2013-05-07 MED ORDER — OXYCODONE HCL 30 MG PO TABS: 30.0000 mg | ORAL_TABLET | Freq: Three times a day (TID) | ORAL | Status: DC | PRN

## 2013-05-07 NOTE — Telephone Encounter (Signed)
Ok for refill x 1.  See if another physician will sign if she wants to pick up today.  When she comes in, have her provide urine sample for UDS.

## 2013-05-07 NOTE — Telephone Encounter (Signed)
I tried to call pt and no answer, mail box was full and cannot leave a message.

## 2013-05-07 NOTE — Telephone Encounter (Signed)
OK 

## 2013-05-09 NOTE — Telephone Encounter (Signed)
[name] will let me know when we can close this chart, it is being reviewed.

## 2013-05-30 ENCOUNTER — Encounter: Payer: Self-pay | Admitting: Internal Medicine

## 2013-05-30 ENCOUNTER — Ambulatory Visit (INDEPENDENT_AMBULATORY_CARE_PROVIDER_SITE_OTHER): Payer: 59 | Admitting: Internal Medicine

## 2013-05-30 VITALS — BP 160/104 | HR 96 | Temp 98.9°F | Wt 316.0 lb

## 2013-05-30 DIAGNOSIS — M545 Low back pain, unspecified: Secondary | ICD-10-CM

## 2013-05-30 DIAGNOSIS — I10 Essential (primary) hypertension: Secondary | ICD-10-CM

## 2013-05-30 DIAGNOSIS — G8929 Other chronic pain: Secondary | ICD-10-CM

## 2013-05-30 LAB — BASIC METABOLIC PANEL
CO2: 28 mEq/L (ref 19–32)
Calcium: 9 mg/dL (ref 8.4–10.5)
Creatinine, Ser: 0.8 mg/dL (ref 0.4–1.2)
GFR: 101.39 mL/min (ref >60.00)

## 2013-05-30 MED ORDER — OXYCODONE HCL 30 MG PO TABS: 30.0000 mg | ORAL_TABLET | Freq: Three times a day (TID) | ORAL | Status: DC | PRN

## 2013-05-30 MED ORDER — LABETALOL HCL 300 MG PO TABS: 300.0000 mg | ORAL_TABLET | Freq: Two times a day (BID) | ORAL | Status: DC

## 2013-05-30 MED ORDER — PHENTERMINE-TOPIRAMATE ER 3.75-23 MG PO CP24: 1.0000 | ORAL_CAPSULE | Freq: Every day | ORAL | Status: DC

## 2013-05-30 NOTE — Patient Instructions (Signed)
Monitor your blood pressure at home.  Contact our office if your blood pressure rises or you experience chest pain.

## 2013-05-30 NOTE — Progress Notes (Signed)
  Subjective:    Patient ID: [name], female    DOB: 1965/12/21, 47 y.o.   MRN: [id number]  HPI  47 year old African American female with uncontrolled hypertension and chronic low back pain for followup. Patient reports good medication compliance however her blood pressure still elevated.  No improvement  in chronic low back pain, knee pain and upper back pain. She takes oxycodone 30 mg 3 times a day as needed.  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  Obesity-patient previously seen at bariatric clinic. She took phentermine short-term with limited results. Patient reports her insurance company will not cover bariatric surgery.  Review of Systems Occasional headaches, headaches worse during menstrual cycle, mild weight loss  Past Medical History  Diagnosis Date  . History of chicken pox   . Headache(784.0)   . Hypertension   . Osteoarthritis   . Morbid obesity     History   Social History  . Marital Status: Divorced    Spouse Name: N/A    Number of Children: N/A  . 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 Smoking status: Never Smoker   . Smokeless tobacco: Never Used  . Alcohol Use: No  . Drug Use: No  . Sexual Activity: Not on file   Other Topics Concern  . Not on file   Social History Narrative   Works at Naval architect for [employer]    Past Surgical History  Procedure Laterality Date  . Tubal ligation  1985    Family History  Problem Relation Age of Onset  . Arthritis Mother   . Prostate cancer Maternal Grandfather     breast, prostate  . Stroke    . Hypertension Mother     No Known Allergies  Current Outpatient Prescriptions on File Prior to Visit  Medication Sig Dispense Refill  . ALPRAZolam (XANAX XR) 0.5 MG 24 hr tablet Take 1 tablet (0.5 mg total) by mouth 2 (two) times daily as needed.  90 tablet  2  . Amlodipine-Valsartan-HCTZ 10-320-25 MG TABS Take  1 tablet by mouth daily.  90 tablet  1  . hydrALAZINE (APRESOLINE) 25 MG tablet Take 1 tablet (25 mg total) by mouth 3 (three) times daily.  90 tablet  1  . potassium chloride SA (K-DUR,KLOR-CON) 20 MEQ tablet Take 1 tablet (20 mEq total) by mouth daily.  90 tablet  4   No current facility-administered medications on file prior to visit.    BP 160/104  Pulse 96  Temp(Src) 98.9 F (37.2 C) (Oral)  Wt 316 lb (143.337 kg)  BMI 46.64 kg/m2       Objective:   Physical Exam  Constitutional: She is oriented to person, place, and time. She appears well-developed and well-nourished.  HENT:  Head: Normocephalic and atraumatic.  Neck: Neck supple.  No carotid bruit  Cardiovascular: Normal rate, regular rhythm and normal heart sounds.   No murmur heard. Pulmonary/Chest: Effort normal and breath sounds normal. She has no wheezes.  Musculoskeletal: She exhibits no edema.  Neurological: She is alert and oriented to person, place, and time.  Skin: Skin is warm and dry.  Psychiatric: She has a normal mood and affect. Her behavior is normal.          Assessment & Plan:

## 2013-05-30 NOTE — Assessment & Plan Note (Signed)
No change in low back pain. Patient not surgical candidate. Continue same dose of oxycodone 30 mg 3 times a day as needed. We discussed tapering dose to 15 mg. Check urine drug screen.

## 2013-05-30 NOTE — Assessment & Plan Note (Signed)
Blood pressure still suboptimally controlled. Continue ExForge Hct. Continue hydralazine 25 mg 3 times a day. Discontinue hydrochlorothiazide 25 mg once daily. Increase labetalol to 300 mg twice a day. BP: 160/104 mmHg  Monitor electrolytes and kidney function

## 2013-05-30 NOTE — Assessment & Plan Note (Signed)
Trial of Qsymia.  Monitor BP closely.  Reassess in 1 month.

## 2013-05-31 LAB — DRUG SCREEN, URINE
Creatinine,U: 409.14 mg/dL
Opiates: POSITIVE — AB
Phencyclidine (PCP): NEGATIVE
Propoxyphene: NEGATIVE

## 2013-06-05 ENCOUNTER — Ambulatory Visit: Payer: 59 | Admitting: Internal Medicine

## 2013-06-20 ENCOUNTER — Ambulatory Visit (INDEPENDENT_AMBULATORY_CARE_PROVIDER_SITE_OTHER): Payer: 59 | Admitting: Internal Medicine

## 2013-06-20 ENCOUNTER — Encounter: Payer: Self-pay | Admitting: Internal Medicine

## 2013-06-20 VITALS — BP 188/110 | Temp 98.6°F | Ht 69.0 in | Wt 318.0 lb

## 2013-06-20 DIAGNOSIS — M25579 Pain in unspecified ankle and joints of unspecified foot: Secondary | ICD-10-CM

## 2013-06-20 DIAGNOSIS — M25572 Pain in left ankle and joints of left foot: Secondary | ICD-10-CM

## 2013-06-20 LAB — CBC WITH DIFFERENTIAL/PLATELET
Basophils Absolute: 0 10*3/uL (ref 0.0–0.1)
Basophils Relative: 1 % (ref 0–1)
Eosinophils Absolute: 0 10*3/uL (ref 0.0–0.7)
Eosinophils Relative: 1 % (ref 0–5)
HCT: 35.9 % — ABNORMAL LOW (ref 36.0–46.0)
MCH: 30 pg (ref 26.0–34.0)
MCHC: 34.3 g/dL (ref 30.0–36.0)
Neutro Abs: 3.1 10*3/uL (ref 1.7–7.7)
Neutrophils Relative %: 46 % (ref 43–77)
RDW: 14.2 % (ref 11.5–15.5)

## 2013-06-20 LAB — BASIC METABOLIC PANEL
CO2: 27 mEq/L (ref 19–32)
Calcium: 9.4 mg/dL (ref 8.4–10.5)
Creat: 0.84 mg/dL (ref 0.50–1.10)
Glucose, Bld: 94 mg/dL (ref 70–99)

## 2013-06-20 LAB — SEDIMENTATION RATE: Sed Rate: 28 mm/hr — ABNORMAL HIGH (ref 0–22)

## 2013-06-20 LAB — URIC ACID: Uric Acid, Serum: 5.2 mg/dL (ref 2.4–7.0)

## 2013-06-20 MED ORDER — COLCHICINE 0.6 MG PO TABS: 0.6000 mg | ORAL_TABLET | Freq: Every day | ORAL | Status: DC

## 2013-06-20 NOTE — Progress Notes (Signed)
Subjective:    Patient ID: [name], female    DOB: 1966-04-04, 47 y.o.   MRN: [id number]  HPI  47 year old African American female with history of morbid obesity, difficult to control hypertension and chronic low back pain complains of acute left ankle swelling and pain for 1-1/2 weeks. She denies any specific injury or trauma. Patient reports she had similar issues in the past and x-ray of left ankle was obtained. It showed degenerative joint disease.  She tried soaking her ankle in Epsom salt which did not improve her symptoms. She reports pain with weightbearing.  She denies fever or chills.  She has had issues with dental caries left upper molar. She has been unable to have tooth extraction.  Review of Systems Negative for fever or chills.  No previous gout flares    Past Medical History  Diagnosis Date  . History of chicken pox   . Headache(784.0)   . Hypertension   . Osteoarthritis   . Morbid obesity     History   Social History  . Marital Status: Divorced    Spouse Name: N/A    Number of Children: N/A  . 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 Smoking status: Never Smoker   . Smokeless tobacco: Never Used  . Alcohol Use: No  . Drug Use: No  . Sexual Activity: Not on file   Other Topics Concern  . Not on file   Social History Narrative   Works at Naval architect for [employer]    Past Surgical History  Procedure Laterality Date  . Tubal ligation  1985    Family History  Problem Relation Age of Onset  . Arthritis Mother   . Prostate cancer Maternal Grandfather     breast, prostate  . Stroke    . Hypertension Mother     No Known Allergies  Current Outpatient Prescriptions on File Prior to Visit  Medication Sig Dispense Refill  . ALPRAZolam (XANAX XR) 0.5 MG 24 hr tablet Take 1 tablet (0.5 mg total) by mouth 2 (two) times daily as needed.  90 tablet  2  .  Amlodipine-Valsartan-HCTZ 10-320-25 MG TABS Take 1 tablet by mouth daily.  90 tablet  1  . hydrALAZINE (APRESOLINE) 25 MG tablet Take 1 tablet (25 mg total) by mouth 3 (three) times daily.  90 tablet  1  . labetalol (NORMODYNE) 300 MG tablet Take 1 tablet (300 mg total) by mouth 2 (two) times daily.  180 tablet  1  . oxycodone (ROXICODONE) 30 MG immediate release tablet Take 1 tablet (30 mg total) by mouth every 8 (eight) hours as needed for pain.  90 tablet  0  . Phentermine-Topiramate (QSYMIA) 3.75-23 MG CP24 Take 1 capsule by mouth daily.  30 capsule  0  . potassium chloride SA (K-DUR,KLOR-CON) 20 MEQ tablet Take 1 tablet (20 mEq total) by mouth daily.  90 tablet  4   No current facility-administered medications on file prior to visit.    BP 188/110  Temp(Src) 98.6 F (37 C) (Oral)  Ht 5\' 9"  (1.753 m)  Wt 318 lb (144.244 kg)  BMI 46.94 kg/m2    Objective:   Physical Exam  Constitutional: She is oriented to person, place, and time. She appears well-developed and well-nourished.  HENT:  Head: Normocephalic and atraumatic.  Right Ear: External ear normal.  Left Ear: External ear normal.  Poor dentition.  Dental caries / fractured tooth left upper molar  Eyes: Conjunctivae are normal. Pupils are equal, round, and reactive to light.  Cardiovascular: Normal rate and regular rhythm.   Pulmonary/Chest: Effort normal and breath sounds normal. She has no wheezes.  Musculoskeletal: She exhibits no edema.  Left ankle swelling, tenderness, no redness.  Pain with ROM  Neurological: She is alert and oriented to person, place, and time. No cranial nerve deficit.  Skin: Skin is warm and dry.          Assessment & Plan:

## 2013-06-20 NOTE — Assessment & Plan Note (Signed)
47 year old [place] American female with acute left ankle pain of unclear etiology. We discussed possibility of gout. However due to issues with dental caries/possible dental abscess, consider septic arthritis. Obtain blood cultures x2. Obtain CBCD, left ankle x ray and sedimentation rate. Also obtain uric acid level. Use colchicine for now.

## 2013-06-20 NOTE — Patient Instructions (Signed)
Please contact our office if your symptoms do not improve or gets worse. Take your labetalol twice daily.

## 2013-06-21 ENCOUNTER — Ambulatory Visit (HOSPITAL_COMMUNITY)
Admission: RE | Admit: 2013-06-21 | Discharge: 2013-06-21 | Disposition: A | Discharge status: Home / Self Care | Payer: 59 | Source: Ambulatory Visit | Attending: Internal Medicine | Admitting: Internal Medicine

## 2013-06-21 DIAGNOSIS — M25572 Pain in left ankle and joints of left foot: Secondary | ICD-10-CM

## 2013-06-21 DIAGNOSIS — M25473 Effusion, unspecified ankle: Secondary | ICD-10-CM | POA: Insufficient documentation

## 2013-06-21 DIAGNOSIS — M109 Gout, unspecified: Secondary | ICD-10-CM | POA: Insufficient documentation

## 2013-06-21 DIAGNOSIS — M25579 Pain in unspecified ankle and joints of unspecified foot: Secondary | ICD-10-CM | POA: Insufficient documentation

## 2013-06-21 DIAGNOSIS — M129 Arthropathy, unspecified: Secondary | ICD-10-CM | POA: Insufficient documentation

## 2013-06-21 DIAGNOSIS — M25476 Effusion, unspecified foot: Secondary | ICD-10-CM | POA: Insufficient documentation

## 2013-06-26 LAB — CULTURE, BLOOD (SINGLE): Organism ID, Bacteria: NO GROWTH

## 2013-06-27 ENCOUNTER — Ambulatory Visit (INDEPENDENT_AMBULATORY_CARE_PROVIDER_SITE_OTHER): Payer: 59 | Admitting: Internal Medicine

## 2013-06-27 ENCOUNTER — Encounter: Payer: Self-pay | Admitting: Internal Medicine

## 2013-06-27 VITALS — BP 172/102 | HR 76 | Temp 98.0°F | Ht 69.0 in | Wt 320.0 lb

## 2013-06-27 DIAGNOSIS — I10 Essential (primary) hypertension: Secondary | ICD-10-CM

## 2013-06-27 DIAGNOSIS — M545 Low back pain, unspecified: Secondary | ICD-10-CM

## 2013-06-27 DIAGNOSIS — M25579 Pain in unspecified ankle and joints of unspecified foot: Secondary | ICD-10-CM

## 2013-06-27 DIAGNOSIS — G8929 Other chronic pain: Secondary | ICD-10-CM

## 2013-06-27 DIAGNOSIS — M25572 Pain in left ankle and joints of left foot: Secondary | ICD-10-CM

## 2013-06-27 MED ORDER — OXYCODONE HCL 30 MG PO TABS: 30.0000 mg | ORAL_TABLET | Freq: Three times a day (TID) | ORAL | Status: DC | PRN

## 2013-06-27 MED ORDER — SPIRONOLACTONE 25 MG PO TABS: 25.0000 mg | ORAL_TABLET | Freq: Every day | ORAL | Status: DC

## 2013-06-27 NOTE — Assessment & Plan Note (Signed)
UDS indicates use of prescribed medication.  Her low back unchanged.  Work towards weight loss and slowly tapering oxycodone use.

## 2013-06-27 NOTE — Patient Instructions (Addendum)
Please complete the following lab tests within one week of starting aldactone: BMET - 401.9 Avoid dehydration.  Do not take any over the counter NSAIDs.  Avoid high potassium foods.

## 2013-06-27 NOTE — Assessment & Plan Note (Signed)
Patient's blood pressure is still suboptimally controlled. Discontinue potassium supplementation. Add Aldactone 25 mg once daily. Patient understands risk of hyperkalemia. Patient advised to avoid NSAIDs. Reassess in one month. Monitor electrolytes and kidney function in one week. BP: 172/102 mmHg

## 2013-06-27 NOTE — Assessment & Plan Note (Signed)
Improved.  Follow up with ortho (Dr. Victorino Dike) for possible ankle brace.

## 2013-06-27 NOTE — Progress Notes (Signed)
Subjective:    Patient ID: [name], female    DOB: Jul 04, 1966, 47 y.o.   MRN: [id number]  HPI  47 year old African American female for followup regarding ankle pain and hypertension. Patient reports ankle swelling has significantly improved. Her symptoms always worse with activity. 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 She plans to followup with orthopedic specialist Dr. Victorino Dike. I suggested patient consider getting ankle brace. X-ray of ankle positive for soft tissue edema, otherwise unremarkable. Her uric acid level was normal.  Hypertension-patient has been taking labetalol twice daily as directed. Her blood pressure is still suboptimally controlled.  Chronic low back pain - unchanged.  She is working towards decreasing use of oxycodone.  Review of Systems Negative for chest pain    Past Medical History  Diagnosis Date  . History of chicken pox   . Headache(784.0)   . Hypertension   . Osteoarthritis   . Morbid obesity     History   Social History  . Marital Status: Divorced    Spouse Name: N/A    Number of Children: N/A  . 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 Smoking status: Never Smoker   . Smokeless tobacco: Never Used  . Alcohol Use: No  . Drug Use: No  . Sexual Activity: Not on file   Other Topics Concern  . Not on file   Social History Narrative   Works at Naval architect for [employer]    Past Surgical History  Procedure Laterality Date  . Tubal ligation  1985    Family History  Problem Relation Age of Onset  . Arthritis Mother   . Prostate cancer Maternal Grandfather     breast, prostate  . Stroke    . Hypertension Mother     No Known Allergies  Current Outpatient Prescriptions on File Prior to Visit  Medication Sig Dispense Refill  . ALPRAZolam (XANAX XR) 0.5 MG 24 hr tablet Take 1 tablet (0.5 mg total) by mouth 2 (two) times daily as needed.   90 tablet  2  . Amlodipine-Valsartan-HCTZ 10-320-25 MG TABS Take 1 tablet by mouth daily.  90 tablet  1  . colchicine 0.6 MG tablet Take 1 tablet (0.6 mg total) by mouth daily.  30 tablet  1  . hydrALAZINE (APRESOLINE) 25 MG tablet Take 1 tablet (25 mg total) by mouth 3 (three) times daily.  90 tablet  1  . labetalol (NORMODYNE) 300 MG tablet Take 1 tablet (300 mg total) by mouth 2 (two) times daily.  180 tablet  1  . oxycodone (ROXICODONE) 30 MG immediate release tablet Take 1 tablet (30 mg total) by mouth every 8 (eight) hours as needed for pain.  90 tablet  0  . Phentermine-Topiramate (QSYMIA) 3.75-23 MG CP24 Take 1 capsule by mouth daily.  30 capsule  0  . potassium chloride SA (K-DUR,KLOR-CON) 20 MEQ tablet Take 1 tablet (20 mEq total) by mouth daily.  90 tablet  4   No current facility-administered medications on file prior to visit.    BP 172/102  Pulse 76  Temp(Src) 98 F (36.7 C) (Oral)  Ht 5\' 9"  (1.753 m)  Wt 320 lb (145.151 kg)  BMI 47.23 kg/m2  LMP 06/21/2013    Objective:   Physical Exam  Constitutional: She is oriented to person, place, and time. She appears well-developed and well-nourished.  Cardiovascular: Normal rate, regular rhythm  and normal heart sounds.   Pulmonary/Chest: Effort normal and breath sounds normal. She has no wheezes.  Musculoskeletal:  Trace lower ext edema bilaterally  Neurological: She is alert and oriented to person, place, and time. No cranial nerve deficit.  Psychiatric: She has a normal mood and affect. Her behavior is normal.          Assessment & Plan:

## 2013-07-04 ENCOUNTER — Other Ambulatory Visit (INDEPENDENT_AMBULATORY_CARE_PROVIDER_SITE_OTHER): Payer: 59

## 2013-07-04 DIAGNOSIS — I10 Essential (primary) hypertension: Secondary | ICD-10-CM

## 2013-07-04 LAB — BASIC METABOLIC PANEL
CO2: 28 mEq/L (ref 19–32)
GFR: 101.35 mL/min (ref >60.00)
Glucose, Bld: 101 mg/dL — ABNORMAL HIGH (ref 70–99)
Potassium: 4.4 mEq/L (ref 3.5–5.1)
Sodium: 141 mEq/L (ref 135–145)

## 2013-07-04 LAB — POCT URINALYSIS DIPSTICK
Bilirubin, UA: NEGATIVE
Blood, UA: NEGATIVE
Glucose, UA: NEGATIVE
Nitrite, UA: NEGATIVE

## 2013-07-25 ENCOUNTER — Encounter: Payer: Self-pay | Admitting: Internal Medicine

## 2013-07-25 ENCOUNTER — Ambulatory Visit (INDEPENDENT_AMBULATORY_CARE_PROVIDER_SITE_OTHER): Payer: 59 | Admitting: Internal Medicine

## 2013-07-25 VITALS — BP 194/96 | HR 72 | Temp 98.3°F | Ht 69.0 in | Wt 320.0 lb

## 2013-07-25 DIAGNOSIS — G8929 Other chronic pain: Secondary | ICD-10-CM

## 2013-07-25 DIAGNOSIS — M545 Low back pain, unspecified: Secondary | ICD-10-CM

## 2013-07-25 DIAGNOSIS — I10 Essential (primary) hypertension: Secondary | ICD-10-CM

## 2013-07-25 MED ORDER — NEBIVOLOL HCL 20 MG PO TABS: 20.0000 mg | ORAL_TABLET | Freq: Every day | ORAL | Status: DC

## 2013-07-25 MED ORDER — OXYCODONE HCL 30 MG PO TABS: 30.0000 mg | ORAL_TABLET | Freq: Three times a day (TID) | ORAL | Status: DC | PRN

## 2013-07-25 NOTE — Assessment & Plan Note (Signed)
Potassium no wall with addition of Aldactone 25 mg. Discontinue labetalol. Switch to Bystolic 20 mg once daily. BP: 194/96 mmHg  Lab Results  Component Value Date   NA 141 07/04/2013   K 4.4 07/04/2013   CL 105 07/04/2013   CO2 28 07/04/2013   Lab Results  Component Value Date   CREATININE 0.8 07/04/2013

## 2013-07-25 NOTE — Assessment & Plan Note (Signed)
Stable.  Continue same dose of oxycodone. Patient plans to schedule knee replacement as well as laser low back surgery in the future.

## 2013-07-25 NOTE — Assessment & Plan Note (Signed)
Qsymia discontinued.  Cost prohibitive.  Also BP uncontrolled.

## 2013-07-25 NOTE — Progress Notes (Signed)
  Subjective:    Patient ID: [name], female    DOB: [birth year], 47 y.o.   MRN: [id number]  HPI  47 year old African American female with uncontrolled hypertension and chronic low back pain for followup. Patient's blood pressure still uncontrolled despite adding aldactone 25 mg once daily. Her followup basic metabolic panel showed normal potassium level.  Patient having ongoing issues with chronic low back pain and bilateral knee pain. She is taking her oxycodone as directed.  She plans to schedule knee replacement surgery as well as back surgery in the future. Review of Systems Negative for chest pain.    Past Medical History  Diagnosis Date  . History of chicken pox   . Headache(784.0)   . Hypertension   . Osteoarthritis   . Morbid obesity     History   Social History  . Marital Status: Divorced    Spouse Name: N/A    Number of Children: N/A  . 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 Smoking status: Never Smoker   . Smokeless tobacco: Never Used  . Alcohol Use: No  . Drug Use: No  . Sexual Activity: Not on file   Other Topics Concern  . Not on file   Social History Narrative   Works at Naval architect for [employer]    Past Surgical History  Procedure Laterality Date  . Tubal ligation  1985    Family History  Problem Relation Age of Onset  . Arthritis Mother   . Prostate cancer Maternal Grandfather     breast, prostate  . Stroke    . Hypertension Mother     No Known Allergies  Current Outpatient Prescriptions on File Prior to Visit  Medication Sig Dispense Refill  . ALPRAZolam (XANAX XR) 0.5 MG 24 hr tablet Take 1 tablet (0.5 mg total) by mouth 2 (two) times daily as needed.  90 tablet  2  . Amlodipine-Valsartan-HCTZ 10-320-25 MG TABS Take 1 tablet by mouth daily.  90 tablet  1  . hydrALAZINE (APRESOLINE) 25 MG tablet Take 1 tablet (25 mg total) by mouth 3 (three) times  daily.  90 tablet  1  . spironolactone (ALDACTONE) 25 MG tablet Take 1 tablet (25 mg total) by mouth daily.  30 tablet  1   No current facility-administered medications on file prior to visit.    BP 194/96  Pulse 72  Temp(Src) 98.3 F (36.8 C) (Oral)  Ht 5\' 9"  (1.753 m)  Wt 320 lb (145.151 kg)  BMI 47.23 kg/m2    Objective:   Physical Exam  Constitutional: She is oriented to person, place, and time. She appears well-developed and well-nourished.  Cardiovascular: Normal rate, regular rhythm and normal heart sounds.   Pulmonary/Chest: Effort normal and breath sounds normal. She has no wheezes.  Musculoskeletal: She exhibits no edema.  Neurological: She is alert and oriented to person, place, and time. No cranial nerve deficit.          Assessment & Plan:

## 2013-08-19 ENCOUNTER — Encounter: Payer: Self-pay | Admitting: Internal Medicine

## 2013-08-20 ENCOUNTER — Ambulatory Visit (INDEPENDENT_AMBULATORY_CARE_PROVIDER_SITE_OTHER): Payer: 59 | Admitting: Internal Medicine

## 2013-08-20 ENCOUNTER — Encounter: Payer: Self-pay | Admitting: Internal Medicine

## 2013-08-20 VITALS — BP 152/100 | HR 80 | Temp 98.3°F | Ht 69.0 in | Wt 324.0 lb

## 2013-08-20 DIAGNOSIS — M545 Low back pain, unspecified: Secondary | ICD-10-CM

## 2013-08-20 DIAGNOSIS — R0600 Dyspnea, unspecified: Secondary | ICD-10-CM | POA: Insufficient documentation

## 2013-08-20 DIAGNOSIS — G8929 Other chronic pain: Secondary | ICD-10-CM

## 2013-08-20 DIAGNOSIS — R0989 Other specified symptoms and signs involving the circulatory and respiratory systems: Secondary | ICD-10-CM

## 2013-08-20 DIAGNOSIS — I10 Essential (primary) hypertension: Secondary | ICD-10-CM

## 2013-08-20 DIAGNOSIS — R0609 Other forms of dyspnea: Secondary | ICD-10-CM

## 2013-08-20 LAB — CBC WITH DIFFERENTIAL/PLATELET
Basophils Absolute: 0 10*3/uL (ref 0.0–0.1)
HCT: 36.1 % (ref 36.0–46.0)
Hemoglobin: 12.3 g/dL (ref 12.0–15.0)
Lymphocytes Relative: 45 % (ref 12–46)
Monocytes Absolute: 0.7 10*3/uL (ref 0.1–1.0)
Monocytes Relative: 11 % (ref 3–12)
Neutro Abs: 2.6 10*3/uL (ref 1.7–7.7)
Neutrophils Relative %: 43 % (ref 43–77)
RDW: 14 % (ref 11.5–15.5)
WBC: 6.3 10*3/uL (ref 4.0–10.5)

## 2013-08-20 LAB — BASIC METABOLIC PANEL
BUN: 12 mg/dL (ref 6–23)
CO2: 30 mEq/L (ref 19–32)
Calcium: 9.3 mg/dL (ref 8.4–10.5)
Glucose, Bld: 87 mg/dL (ref 70–99)
Sodium: 141 mEq/L (ref 135–145)

## 2013-08-20 LAB — D-DIMER, QUANTITATIVE: D-Dimer, Quant: 0.48 ug/mL-FEU (ref 0.00–0.48)

## 2013-08-20 MED ORDER — OXYCODONE HCL 30 MG PO TABS: 30.0000 mg | ORAL_TABLET | Freq: Three times a day (TID) | ORAL | Status: DC | PRN

## 2013-08-20 MED ORDER — NEBIVOLOL HCL 20 MG PO TABS: 40.0000 mg | ORAL_TABLET | Freq: Every day | ORAL | Status: DC

## 2013-08-20 NOTE — Progress Notes (Signed)
Pre visit review using our clinic review tool, if applicable. No additional management support is needed unless otherwise documented below in the visit note. 

## 2013-08-20 NOTE — Assessment & Plan Note (Signed)
No change.  Continue same dose of oxycodone. Check urine drug screen.

## 2013-08-20 NOTE — Progress Notes (Signed)
Subjective:    Patient ID: [name], female    DOB: Apr 03, 1966, 47 y.o.   MRN: [id number]  HPI  47 year old African American female with severe hypertension, morbid obesity and chronic low back pain for followup. Patient reports good compliance with her blood pressure medication. She had episode of unexplained dyspnea with exertion last week. It occurred at work. Her symptoms improved with rest. She denies orthopnea. No associated chest pain or diaphoresis.  Her back pain is unchanged. She's currently taking oxycodone 30 mg 3 times a day. No side effects noted.  Review of Systems    Negative for chest pain, Negative for calf swelling  Past Medical History  Diagnosis Date  . History of chicken pox   . Headache(784.0)   . Hypertension   . Osteoarthritis   . Morbid obesity     History   Social History  . Marital Status: Divorced    Spouse Name: N/A    Number of Children: N/A  . 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 Smoking status: Never Smoker   . Smokeless tobacco: Never Used  . Alcohol Use: No  . Drug Use: No  . Sexual Activity: Not on file   Other Topics Concern  . Not on file   Social History Narrative   Works at Naval architect for [employer]    Past Surgical History  Procedure Laterality Date  . Tubal ligation  1985    Family History  Problem Relation Age of Onset  . Arthritis Mother   . Prostate cancer Maternal Grandfather     breast, prostate  . Stroke    . Hypertension Mother     No Known Allergies  Current Outpatient Prescriptions on File Prior to Visit  Medication Sig Dispense Refill  . ALPRAZolam (XANAX XR) 0.5 MG 24 hr tablet Take 1 tablet (0.5 mg total) by mouth 2 (two) times daily as needed.  90 tablet  2  . Amlodipine-Valsartan-HCTZ 10-320-25 MG TABS Take 1 tablet by mouth daily.  90 tablet  1  . COLCRYS 0.6 MG tablet Take 1 tablet by mouth daily as needed.       . hydrALAZINE (APRESOLINE) 25 MG tablet Take 1 tablet (25 mg total) by mouth 3 (three) times daily.  90 tablet  1  . Nebivolol HCl (BYSTOLIC) 20 MG TABS Take 1 tablet (20 mg total) by mouth daily.  30 tablet  3  . oxycodone (ROXICODONE) 30 MG immediate release tablet Take 1 tablet (30 mg total) by mouth every 8 (eight) hours as needed for pain.  90 tablet  0  . spironolactone (ALDACTONE) 25 MG tablet Take 1 tablet (25 mg total) by mouth daily.  30 tablet  1   No current facility-administered medications on file prior to visit.    BP 152/100  Pulse 80  Temp(Src) 98.3 F (36.8 C) (Oral)  Ht 5\' 9"  (1.753 m)  Wt 324 lb (146.965 kg)  BMI 47.82 kg/m2    Objective:   Physical Exam  Constitutional: She appears well-developed and well-nourished.  HENT:  Head: Normocephalic and atraumatic.  Mouth/Throat: Oropharynx is clear and moist.  Eyes: Conjunctivae and EOM are normal. Pupils are equal, round, and reactive to light.  Neck: Neck supple.  Cardiovascular: Normal rate, regular rhythm, normal heart sounds and intact distal pulses.   No murmur heard. Pulmonary/Chest: Effort normal and breath sounds normal. She has no  wheezes.  Abdominal: Soft. Bowel sounds are normal. There is no tenderness.  Musculoskeletal: She exhibits no edema.  Puffy ankles. No calf tenderness  Lymphadenopathy:    She has no cervical adenopathy.  Skin: Skin is warm and dry.  Psychiatric: She has a normal mood and affect.          Assessment & Plan:

## 2013-08-20 NOTE — Assessment & Plan Note (Signed)
Blood pressure still suboptimally controlled. Increase Bystolic to 40 mg once daily. Continue same dose of other agents. BP: 152/100 mmHg

## 2013-08-20 NOTE — Patient Instructions (Signed)
Our office will contact you re: referral to cardiologist I suggest you seek emergency medical care in the event that your unexplained shortness of breath recurs.[name]

## 2013-08-20 NOTE — Assessment & Plan Note (Signed)
47 year old [place] American female with multiple risk factors presents with unexplained dyspnea. Refer to cardiology for further evaluation. Obtain CBC differential and d-dimer.

## 2013-08-26 ENCOUNTER — Encounter: Payer: Self-pay | Admitting: *Deleted

## 2013-09-04 ENCOUNTER — Ambulatory Visit: Payer: 59 | Admitting: Cardiology

## 2013-09-04 ENCOUNTER — Encounter: Payer: Self-pay | Admitting: *Deleted

## 2013-09-11 ENCOUNTER — Ambulatory Visit: Payer: 59 | Admitting: Cardiology

## 2013-09-12 ENCOUNTER — Encounter: Payer: Self-pay | Admitting: Internal Medicine

## 2013-09-23 ENCOUNTER — Encounter: Payer: Self-pay | Admitting: Cardiology

## 2013-10-01 ENCOUNTER — Encounter: Payer: Self-pay | Admitting: Cardiology

## 2013-10-17 ENCOUNTER — Ambulatory Visit (INDEPENDENT_AMBULATORY_CARE_PROVIDER_SITE_OTHER): Payer: 59 | Admitting: Internal Medicine

## 2013-10-17 ENCOUNTER — Ambulatory Visit (INDEPENDENT_AMBULATORY_CARE_PROVIDER_SITE_OTHER): Payer: 59 | Admitting: Cardiology

## 2013-10-17 ENCOUNTER — Encounter: Payer: Self-pay | Admitting: Internal Medicine

## 2013-10-17 ENCOUNTER — Encounter: Payer: Self-pay | Admitting: Cardiology

## 2013-10-17 VITALS — BP 178/110 | HR 80 | Temp 98.2°F | Ht 69.0 in | Wt 325.0 lb

## 2013-10-17 VITALS — BP 140/100 | HR 80 | Ht 69.0 in | Wt 324.0 lb

## 2013-10-17 DIAGNOSIS — R0609 Other forms of dyspnea: Secondary | ICD-10-CM

## 2013-10-17 DIAGNOSIS — R0600 Dyspnea, unspecified: Secondary | ICD-10-CM

## 2013-10-17 DIAGNOSIS — R0989 Other specified symptoms and signs involving the circulatory and respiratory systems: Secondary | ICD-10-CM

## 2013-10-17 DIAGNOSIS — G8929 Other chronic pain: Secondary | ICD-10-CM

## 2013-10-17 DIAGNOSIS — M545 Low back pain, unspecified: Secondary | ICD-10-CM

## 2013-10-17 DIAGNOSIS — I5032 Chronic diastolic (congestive) heart failure: Secondary | ICD-10-CM

## 2013-10-17 DIAGNOSIS — I10 Essential (primary) hypertension: Secondary | ICD-10-CM

## 2013-10-17 MED ORDER — SPIRONOLACTONE 25 MG PO TABS: 25.0000 mg | ORAL_TABLET | Freq: Every day | ORAL | Status: DC

## 2013-10-17 MED ORDER — OXYCODONE HCL 30 MG PO TABS: 30.0000 mg | ORAL_TABLET | Freq: Three times a day (TID) | ORAL | Status: DC | PRN

## 2013-10-17 MED ORDER — NEBIVOLOL HCL 20 MG PO TABS: 40.0000 mg | ORAL_TABLET | Freq: Every day | ORAL | Status: DC

## 2013-10-17 MED ORDER — AMLODIPINE-VALSARTAN-HCTZ 10-320-25 MG PO TABS
1.0000 | ORAL_TABLET | Freq: Every day | ORAL | Status: DC
Start: 2013-10-17 — End: 2014-03-11

## 2013-10-17 MED ORDER — ATORVASTATIN CALCIUM 10 MG PO TABS: 10.0000 mg | ORAL_TABLET | Freq: Every day | ORAL | Status: DC

## 2013-10-17 MED ORDER — ISOSORBIDE MONONITRATE ER 30 MG PO TB24: 30.0000 mg | ORAL_TABLET | Freq: Every day | ORAL | Status: DC

## 2013-10-17 MED ORDER — AMLODIPINE-VALSARTAN-HCTZ 10-320-25 MG PO TABS: 1.0000 | ORAL_TABLET | Freq: Every day | ORAL | Status: DC

## 2013-10-17 MED ORDER — FUROSEMIDE 20 MG PO TABS: 20.0000 mg | ORAL_TABLET | Freq: Every day | ORAL | Status: DC

## 2013-10-17 NOTE — Assessment & Plan Note (Addendum)
Patient has difficult to control hypertension. I doubt she has secondary hypertension. Check serum metanephrines. Discontinue hydralazine. Add Lasix 20 mg once daily. Patient advised to reduce her sodium intake.  BP: 178/110 mmHg   Lab Results  Component Value Date   CREATININE 0.93 08/20/2013

## 2013-10-17 NOTE — Progress Notes (Signed)
Patient ID: [name], female   DOB: 11/14/1965, 48 y.o.   MRN: [id number]    Patient Name: [name] Date of Encounter: 10/17/2013  Primary Care Provider:  Thomos Lemons, DO Primary Cardiologist:  Tobias Alexander, H  Problem List   Past Medical History  Diagnosis Date  . History of chicken pox   . Headache(784.0)   . Hypertension   . Osteoarthritis   . Morbid obesity    Past Surgical History  Procedure Laterality Date  . Tubal ligation  1985   Allergies  No Known Allergies  HPI  48 year old [place] American female with severe hypertension, morbid obesity is referred to [place] for management of hypertension. Patient reports good compliance with her blood pressure medication. She had episode of unexplained dyspnea with exertion last week. She states that she gets short of breath with minimal exertion like walking from her house to the car. The patient states that overall she has been getting more short of breath in the last year. She denies palpitations or chest pain associated with dyspnea. Those episodes resolve after about 5 minutes of being at rest. The patient also describes sharp left-sided chest pains appearing at rest that worsen with different position usually resolve within 2-3 minutes. It occurred at work. Her symptoms improved with rest. She denies orthopnea. No associated chest pain or diaphoresis.  Home Medications  Prior to Admission medications   Medication Sig Start Date End Date Taking? Authorizing Provider  ALPRAZolam (XANAX XR) 0.5 MG 24 hr tablet Take 1 tablet (0.5 mg total) by mouth 2 (two) times daily as needed. 04/11/13  Yes Gordy Savers, MD  Amlodipine-Valsartan-HCTZ [phone] MG TABS Take 1 tablet by mouth daily. 04/11/13  Yes Gordy Savers, MD  COLCRYS 0.6 MG tablet Take 1 tablet by mouth daily as needed. 06/20/13  Yes Historical Provider, MD  hydrALAZINE (APRESOLINE) 25 MG tablet Take 1 tablet (25 mg total) by mouth 3 (three) times daily.  04/11/13  Yes Gordy Savers, MD  Nebivolol HCl 20 MG TABS Take 2 tablets (40 mg total) by mouth daily. 08/20/13  Yes Doe-Hyun R Artist Pais, DO  oxycodone (ROXICODONE) 30 MG immediate release tablet Take 1 tablet (30 mg total) by mouth every 8 (eight) hours as needed for pain. 08/20/13  Yes Doe-Hyun R Artist Pais, DO  spironolactone (ALDACTONE) 25 MG tablet Take 1 tablet (25 mg total) by mouth daily. 06/27/13  Yes Doe-Hyun Sherran Needs, DO    Family History  Family History  Problem Relation Age of Onset  . Arthritis Mother   . Prostate cancer Maternal Grandfather   . Stroke    . Hypertension Mother   . Breast cancer Maternal Grandfather     Social History  History   Social History  . Marital Status: Divorced    Spouse Name: N/A    Number of Children: N/A  . 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 Smoking status: Never Smoker   . Smokeless tobacco: Never Used  . Alcohol Use: No  . Drug Use: No  . Sexual Activity: Not on file   Other Topics Concern  . Not on file   Social History Narrative   Works at Naval architect for [employer], as per HPI, otherwise negative General:  No chills, fever, night sweats or weight changes.  Cardiovascular:  No chest pain, dyspnea on exertion, edema,  orthopnea, palpitations, paroxysmal nocturnal dyspnea. Dermatological: No rash, lesions/masses Respiratory: No cough, dyspnea Urologic: No hematuria, dysuria Abdominal:   No nausea, vomiting, diarrhea, bright red blood per rectum, melena, or hematemesis Neurologic:  No visual changes, wkns, changes in mental status. All other systems reviewed and are otherwise negative except as noted above.  Physical Exam  Height 5\' 9"  (1.753 m), weight 324 lb (146.965 kg).  General: Pleasant, NAD Psych: Normal affect. Neuro: Alert and oriented X 3. Moves all extremities spontaneously. HEENT: Normal  Neck: Supple without  bruits or JVD. Lungs:  Resp regular and unlabored, CTA. Heart: RRR no s3, s4, or murmurs. Abdomen: Soft, non-tender, non-distended, BS + x 4.  Extremities: No clubbing, cyanosis or edema. DP/PT/Radials 2+ and equal bilaterally.  Labs:  No results found for this basename: CKTOTAL, CKMB, TROPONINI,  in the last 72 hours Lab Results  Component Value Date   WBC 6.3 08/20/2013   HGB 12.3 08/20/2013   HCT 36.1 08/20/2013   MCV 88.0 08/20/2013   PLT 314 08/20/2013   No results found for this basename: NA, K, CL, CO2, BUN, CREATININE, CALCIUM, LABALBU, PROT, BILITOT, ALKPHOS, ALT, AST, GLUCOSE,  in the last 168 hours Lab Results  Component Value Date   CHOL 160 07/22/2012   HDL 50.60 07/22/2012   LDLCALC 99 07/22/2012   TRIG 50.0 07/22/2012   Lab Results  Component Value Date   DDIMER 0.48 08/20/2013   No components found with this basename: POCBNP,   Accessory Clinical Findings  Echocardiogram 10/13/2008  LEFT VENTRICLE: - Left ventricular size was normal. - Overall left ventricular systolic function was vigorous. - Left ventricular ejection fraction was estimated , range being 65 % to 75 %. - Although no diagnostic left ventricular regional wall motion abnormality was identified, this possibility cannot be completely excluded on the basis of this study. - Left ventricular wall thickness was mildly to moderately increased.  AORTIC VALVE: - The aortic valve was grossly normal. - The aortic valve was not well visualized.  Doppler interpretation(s): - There was no significant aortic valvular regurgitation.  AORTA: - The aortic root was normal in size.  MITRAL VALVE: - The mitral valve was not well visualized.  Doppler interpretation(s): - There was no significant mitral valvular regurgitation.  LEFT ATRIUM: - The left atrium was mildly dilated.  PULMONARY VEINS: Doppler interpretation(s): - The Doppler was not recordable.  RIGHT VENTRICLE: - The right  ventricle was not well visualized. - Right ventricular size was normal. - Right ventricular systolic function was normal. - The estimated peak right ventricular systolic pressure was within normal range.  PULMONIC VALVE: - The pulmonic valve was not well visualized.  TRICUSPID VALVE: - The tricuspid valve was not well visualized.  Doppler interpretation(s): - There was mild tricuspid valvular regurgitation.  PULMONARY ARTERY: - Pulmonary artery size could not be determined.  RIGHT ATRIUM: - Right atrial size was normal.  PERICARDIUM: - There was no pericardial effusion.  ---------------------------------------------------------------  SUMMARY - Overall left ventricular systolic function was vigorous. Left ventricular ejection fraction was estimated , range being 65 % to 75 %. Although no diagnostic left ventricular regional wall motion abnormality was identified, this possibility cannot be completely excluded on the basis of this study. Left ventricular wall thickness was mildly to moderately increased. - The left atrium was mildly dilated.  IMPRESSIONS - Except for the abnormalities described, no likely, discrete, intracardiac source of emboli was apparent. The study would have quite limited sensitivity for such a source.  TEE should be more sensitive if clinically warranted.     ECG - sinus rhythm, normal EKG   Assessment & Plan  1.  Hypertension - poorly controlled, Echocardiogram in 2010 - good LVEF, mild to moderate lVH, ? diastolic function, ideally we would add BiDil for management of diastolic heart failure, however too expensive. She is already on hydralazine, we will add Imdur 30 mg po daily.  2. DOE - concerning considering her risk factors, we will order a lexiscan nuclear stress test, she will also need a preop evaluation prior to her knee replacement  3. Chronic diastolic heart failure - currently euvolemic, starting nitrates in addition to  hydralazine.   4. Hyperlipidemia - followed by PCP, the last time in 06/2012 at goal, not checked since then.   Follow up in 3 weeks  Lars MassonNELSON, Loella Hickle, H, MD, [facility] 10/17/2013, 10:35 AM

## 2013-10-17 NOTE — Assessment & Plan Note (Signed)
Mild worsening but not signs of radiculopathy.  Continue same dose of oxycodone.

## 2013-10-17 NOTE — Progress Notes (Signed)
Pre visit review using our clinic review tool, if applicable. No additional management support is needed unless otherwise documented below in the visit note. 

## 2013-10-17 NOTE — Patient Instructions (Signed)
Please follow low salt diet (3 grams per day) Please complete the following lab tests before your next follow up appointment: BMET - 401.9 Serum free metanephrines - 401.9

## 2013-10-17 NOTE — Patient Instructions (Signed)
**Note De-Identified Jamira Barfuss Obfuscation** Your physician has requested that you have a lexiscan myoview. For further information please visit [email or web address] Please follow instruction sheet, as given.  Your physician has recommended you make the following change in your medication: start taking Imdur 30 mg daily  Your physician recommends that you schedule a follow-up appointment [facility]

## 2013-10-17 NOTE — Progress Notes (Signed)
   Subjective:    Patient ID: Joan PereyraMary A [name], female    DOB: [birth year], 48 y.o.   MRN: [id number]  HPI  48 year old African American female with difficult to control hypertension, chronic low back pain morbid obesity for routine followup. At previous visit of Bystolic dose is increased to 40 mg. Patient reports her home blood pressure readings have improved where her systolic blood pressures in the 140s. We reviewed her diet. She is currently consuming a high sodium diet.  (Canned soups, TV dinners)  Patient reports her low back pain somewhat worse.  Interval medical history-patient seen by cardiology for chest pain. Nuclear stress test is planned.   Review of Systems Slightly worsening low back pain,  No radiation of low back pain    Past Medical History  Diagnosis Date  . History of chicken pox   . Headache(784.0)   . Hypertension   . Osteoarthritis   . Morbid obesity     History   Social History  . Marital Status: Divorced    Spouse Name: N/A    Number of Children: N/A  . 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 Smoking status: Never Smoker   . Smokeless tobacco: Never Used  . Alcohol Use: No  . Drug Use: No  . Sexual Activity: Not on file   Other Topics Concern  . Not on file   Social History Narrative   Works at [employer] for [employer]    Past Surgical History  Procedure Laterality Date  . Tubal ligation  1985    Family History  Problem Relation Age of Onset  . Arthritis Mother   . Prostate cancer Maternal Grandfather   . Stroke    . Hypertension Mother   . Breast cancer Maternal Grandfather     No Known Allergies  Current Outpatient Prescriptions on File Prior to Visit  Medication Sig Dispense Refill  . ALPRAZolam (XANAX XR) 0.5 MG 24 hr tablet Take 1 tablet (0.5 mg total) by mouth 2 (two) times daily as needed.  90 tablet  2  . COLCRYS 0.6 MG tablet Take 1 tablet by  mouth daily as needed.       No current facility-administered medications on file prior to visit.    BP 178/110  Pulse 80  Temp(Src) 98.2 F (36.8 C) (Oral)  Ht 5\' 9"  (1.753 m)  Wt 325 lb (147.419 kg)  BMI 47.97 kg/m2    Objective:   Physical Exam  Constitutional: She is oriented to person, place, and time. She appears well-developed and well-nourished.  HENT:  Head: Normocephalic and atraumatic.  Eyes: Pupils are equal, round, and reactive to light.  Cardiovascular: Normal rate, regular rhythm and normal heart sounds.   No murmur heard. Pulmonary/Chest: Effort normal and breath sounds normal. She has no wheezes.  Neurological: She is alert and oriented to person, place, and time. No cranial nerve deficit.  Skin: Skin is warm and dry.  Psychiatric: She has a normal mood and affect. Her behavior is normal.          Assessment & Plan:

## 2013-11-06 ENCOUNTER — Ambulatory Visit (HOSPITAL_COMMUNITY): Payer: 59 | Attending: Cardiology | Admitting: Radiology

## 2013-11-06 VITALS — BP 163/94 | Ht 69.0 in | Wt 317.0 lb

## 2013-11-06 DIAGNOSIS — Z01810 Encounter for preprocedural cardiovascular examination: Secondary | ICD-10-CM | POA: Insufficient documentation

## 2013-11-06 DIAGNOSIS — R079 Chest pain, unspecified: Secondary | ICD-10-CM

## 2013-11-06 DIAGNOSIS — R0602 Shortness of breath: Secondary | ICD-10-CM

## 2013-11-06 DIAGNOSIS — I10 Essential (primary) hypertension: Secondary | ICD-10-CM | POA: Insufficient documentation

## 2013-11-06 DIAGNOSIS — E785 Hyperlipidemia, unspecified: Secondary | ICD-10-CM | POA: Insufficient documentation

## 2013-11-06 DIAGNOSIS — R42 Dizziness and giddiness: Secondary | ICD-10-CM | POA: Insufficient documentation

## 2013-11-06 DIAGNOSIS — Z8249 Family history of ischemic heart disease and other diseases of the circulatory system: Secondary | ICD-10-CM | POA: Insufficient documentation

## 2013-11-06 DIAGNOSIS — R0989 Other specified symptoms and signs involving the circulatory and respiratory systems: Secondary | ICD-10-CM | POA: Insufficient documentation

## 2013-11-06 DIAGNOSIS — R0600 Dyspnea, unspecified: Secondary | ICD-10-CM

## 2013-11-06 DIAGNOSIS — R0609 Other forms of dyspnea: Secondary | ICD-10-CM | POA: Insufficient documentation

## 2013-11-06 MED ORDER — TECHNETIUM TC 99M SESTAMIBI GENERIC - CARDIOLITE
33.0000 | Freq: Once | INTRAVENOUS | Status: AC | PRN
  Administered 2013-11-06: 33 via INTRAVENOUS

## 2013-11-06 MED ORDER — REGADENOSON 0.4 MG/5ML IV SOLN
0.4000 mg | Freq: Once | INTRAVENOUS | Status: AC
  Administered 2013-11-06: 0.4 mg via INTRAVENOUS

## 2013-11-06 NOTE — Progress Notes (Signed)
  [facility] SITE 3 NUCLEAR MED [address] [place], [place] [address] [phone]    Cardiology Nuclear Med Study  Joan PereyraMary A [name] is a 48 y.o. female     MRN : [id number]     DOB: 1965-11-21  Procedure Date: 11/06/2013  Nuclear Med Background Indication for Stress Test:  Evaluation for Ischemia and Surgical Clearance:TKR with Dr. Charlann Boxerlin History:  2010 Echo EF 65-75% Cardiac Risk Factors: Family History - CAD, Hypertension and Lipids  Symptoms:  Chest Pain, Dizziness and DOE   Nuclear Pre-Procedure Caffeine/Decaff Intake:  None NPO After: 7:00pm   Lungs:  clear O2 Sat: 96% on room air. IV 0.9% NS with Angio Cath:  20g  IV Site: R Hand  IV Started by:  Cathlyn Parsonsynthia Hasspacher, RN  Chest Size (in):  44 Cup Size: D  Height: 5\' 9"  (1.753 m)  Weight:  317 lb (143.79 kg)  BMI:  Body mass index is 46.79 kg/(m^2). Tech Comments:  n/a    Nuclear Med Study 1 or 2 day study: 2 day  Stress Test Type:  Lexiscan  Reading MD: n/a  Order Authorizing Provider:  Wyvonna PlumKatarina Nelson,MD  Resting Radionuclide: Technetium 5321m Sestamibi  Resting Radionuclide Dose: 33.0 mCi ON 11/12/13   Stress Radionuclide:  Technetium 5321m Sestamibi  Stress Radionuclide Dose: 33.0 mCi ON 11/06/13           Stress Protocol Rest HR: 71 Stress HR: 94  Rest BP: 163/94 Stress BP: 168/101  Exercise Time (min): n/a METS: n/a   Predicted Max HR: 172 bpm % Max HR: 54.65 bpm Rate Pressure Product: [id number]   Dose of Adenosine (mg):  n/a Dose of Lexiscan: 0.4 mg  Dose of Atropine (mg): n/a Dose of Dobutamine: n/a mcg/kg/min (at max HR)  Stress Test Technologist: Bonnita LevanJackie Smith, RN  Nuclear Technologist:  Domenic PoliteStephen Carbone, CNMT     Rest Procedure:  Myocardial perfusion imaging was performed at rest 45 minutes following the intravenous administration of Technetium 3421m Sestamibi. Rest ECG: NSR - Normal EKG  Stress Procedure:  The patient received IV Lexiscan 0.4 mg over 15-seconds.  Technetium 1521m Sestamibi injected at  30-seconds.  Quantitative spect images were obtained after a 45 minute delay. Stress ECG: No significant change from baseline ECG  QPS Raw Data Images:  Mild breast attenuation.  Normal left ventricular size. Stress Images:  Normal homogeneous uptake in all areas of the myocardium. Rest Images:  Normal homogeneous uptake in all areas of the myocardium. Subtraction (SDS):  No evidence of ischemia. Transient Ischemic Dilatation (Normal <1.22):  1.09 Lung/Heart Ratio (Normal <0.45):  0.24  Quantitative Gated Spect Images QGS EDV:  151 ml QGS ESV:  75 ml  Impression Exercise Capacity:  Lexiscan with no exercise. BP Response:  Normal blood pressure response. Clinical Symptoms:  No significant symptoms noted. ECG Impression:  No significant ST segment change suggestive of ischemia. Comparison with Prior Nuclear Study: No previous nuclear study performed  Overall Impression:  Low risk stress nuclear study with no ischemia..  LV Ejection Fraction: 51%.  LV Wall Motion:  NL LV Function; NL Wall Motion  Donato SchultzSKAINS, MARK, MD

## 2013-11-12 ENCOUNTER — Ambulatory Visit (HOSPITAL_COMMUNITY): Payer: 59 | Attending: Cardiology

## 2013-11-12 ENCOUNTER — Encounter (HOSPITAL_COMMUNITY): Payer: 59

## 2013-11-12 DIAGNOSIS — R0989 Other specified symptoms and signs involving the circulatory and respiratory systems: Secondary | ICD-10-CM

## 2013-11-12 MED ORDER — TECHNETIUM TC 99M SESTAMIBI GENERIC - CARDIOLITE
33.0000 | Freq: Once | INTRAVENOUS | Status: AC | PRN
  Administered 2013-11-12: 33 via INTRAVENOUS

## 2013-11-13 ENCOUNTER — Other Ambulatory Visit: Payer: Self-pay | Admitting: Internal Medicine

## 2013-11-13 ENCOUNTER — Other Ambulatory Visit: Payer: 59

## 2013-11-14 ENCOUNTER — Ambulatory Visit: Payer: 59 | Admitting: Cardiology

## 2013-11-14 ENCOUNTER — Other Ambulatory Visit (INDEPENDENT_AMBULATORY_CARE_PROVIDER_SITE_OTHER): Payer: 59

## 2013-11-14 ENCOUNTER — Telehealth: Payer: Self-pay | Admitting: Internal Medicine

## 2013-11-14 DIAGNOSIS — I10 Essential (primary) hypertension: Secondary | ICD-10-CM

## 2013-11-14 LAB — BASIC METABOLIC PANEL
BUN: 12 mg/dL (ref 6–23)
CHLORIDE: 105 meq/L (ref 96–112)
CO2: 25 meq/L (ref 19–32)
Calcium: 9.2 mg/dL (ref 8.4–10.5)
Creatinine, Ser: 0.8 mg/dL (ref 0.4–1.2)
GFR: 102.71 mL/min (ref >60.00)
Glucose, Bld: 88 mg/dL (ref 70–99)
POTASSIUM: 4.4 meq/L (ref 3.5–5.1)
SODIUM: 136 meq/L (ref 135–145)

## 2013-11-14 NOTE — Telephone Encounter (Signed)
As per pain contract, we can not replace any lost prescriptions.

## 2013-11-14 NOTE — Telephone Encounter (Signed)
Pt lost rx for oxycodone

## 2013-11-17 ENCOUNTER — Telehealth: Payer: Self-pay | Admitting: Internal Medicine

## 2013-11-17 NOTE — Telephone Encounter (Signed)
Relevant patient education assigned to patient using Emmi. ° °

## 2013-11-17 NOTE — Telephone Encounter (Signed)
Attempted to call patient but "there is a voice mailbox that hasn't been set up yet".

## 2013-11-18 NOTE — Telephone Encounter (Signed)
Tried to contact pt, voice mailbox has not been set up yet unable to leave message.

## 2013-11-19 LAB — METANEPHRINES, PLASMA
Metanephrine, Free: 27 pg/mL (ref <=57)
NORMETANEPHRINE FREE: 45 pg/mL (ref <=148)
Total Metanephrines-Plasma: 72 pg/mL (ref <=205)

## 2013-11-21 ENCOUNTER — Ambulatory Visit: Payer: 59 | Admitting: Internal Medicine

## 2013-12-02 ENCOUNTER — Encounter: Payer: Self-pay | Admitting: *Deleted

## 2013-12-04 ENCOUNTER — Ambulatory Visit: Payer: 59 | Admitting: Cardiology

## 2013-12-05 ENCOUNTER — Ambulatory Visit (INDEPENDENT_AMBULATORY_CARE_PROVIDER_SITE_OTHER): Payer: 59 | Admitting: Internal Medicine

## 2013-12-05 ENCOUNTER — Encounter: Payer: Self-pay | Admitting: Internal Medicine

## 2013-12-05 VITALS — BP 170/110 | HR 92 | Temp 98.2°F | Ht 69.0 in | Wt 332.0 lb

## 2013-12-05 DIAGNOSIS — M545 Low back pain, unspecified: Secondary | ICD-10-CM

## 2013-12-05 DIAGNOSIS — G8929 Other chronic pain: Secondary | ICD-10-CM

## 2013-12-05 DIAGNOSIS — M25569 Pain in unspecified knee: Secondary | ICD-10-CM

## 2013-12-05 DIAGNOSIS — I10 Essential (primary) hypertension: Secondary | ICD-10-CM

## 2013-12-05 DIAGNOSIS — M25561 Pain in right knee: Secondary | ICD-10-CM

## 2013-12-05 MED ORDER — OXYCODONE HCL 30 MG PO TABS: 30.0000 mg | ORAL_TABLET | Freq: Three times a day (TID) | ORAL | Status: DC | PRN

## 2013-12-05 MED ORDER — GABAPENTIN 300 MG PO CAPS: 300.0000 mg | ORAL_CAPSULE | Freq: Every day | ORAL | Status: DC

## 2013-12-05 MED ORDER — FUROSEMIDE 40 MG PO TABS: 40.0000 mg | ORAL_TABLET | Freq: Every day | ORAL | Status: DC

## 2013-12-05 NOTE — Assessment & Plan Note (Signed)
Patient deferring right knee replacement.  Patient unable to take time off work.

## 2013-12-05 NOTE — Assessment & Plan Note (Signed)
Patient reports low back pain intermittently radiating to left leg. Consider repeating MRI of lumbar spine. Add gabapentin 300 mg at bedtime. Continue oxycodone 30 mg 3 times a day as needed.  Retry physical therapy.

## 2013-12-05 NOTE — Assessment & Plan Note (Signed)
BP poorly controlled.  She has pitting edema.  Increase lasix to 40 mg.  Monitor electrolytes and kidney function. BP: 170/110 mmHg  Lab Results  Component Value Date   CREATININE 0.8 11/14/2013

## 2013-12-05 NOTE — Progress Notes (Signed)
Pre visit review using our clinic review tool, if applicable. No additional management support is needed unless otherwise documented below in the visit note. 

## 2013-12-05 NOTE — Patient Instructions (Signed)
Please complete the following lab tests before your next follow up appointment: BMET - 401.9 

## 2013-12-05 NOTE — Progress Notes (Signed)
Subjective:    Patient ID: Joan PereyraMary A Lamora, female    DOB: September 10, 1966, 48 y.o.   MRN: [id number]  HPI 48 year old African American female with difficult to control hypertension, morbid obesity and chronic low back pain for followup.    Patient reports good medication compliance. However her blood pressure is worse.  She has gained weight since previous visit.  She is trying reduce her salt intake.  Patient pressure reached higher blood pressure readings due to ongoing low back pain.  Chronic low back pain-her symptoms worse with prolonged sitting.  Patient has tried physical therapy and TENS unit in the past.  Patient complains low back pain radiating to left leg.  She denies weakness.    Review of Systems No leg weakness,  No urinary or bladder incontinence.    Past Medical History  Diagnosis Date  . History of chicken pox   . Headache(784.0)   . Hypertension   . Osteoarthritis   . Morbid obesity     History   Social History  . Marital Status: Divorced    Spouse Name: N/A    Number of Children: N/A  . 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 Smoking status: Never Smoker   . Smokeless tobacco: Never Used  . Alcohol Use: No  . Drug Use: No  . Sexual Activity: Not on file   Other Topics Concern  . Not on file   Social History Narrative   Works at [employer] for [employer]    Past Surgical History  Procedure Laterality Date  . Tubal ligation  1985    Family History  Problem Relation Age of Onset  . Arthritis Mother   . Prostate cancer Maternal Grandfather   . Stroke    . Hypertension Mother   . Breast cancer Maternal Grandfather     No Known Allergies  Current Outpatient Prescriptions on File Prior to Visit  Medication Sig Dispense Refill  . ALPRAZolam (XANAX XR) 0.5 MG 24 hr tablet Take 1 tablet (0.5 mg total) by mouth 2 (two) times daily as needed.  90 tablet  2  .  Amlodipine-Valsartan-HCTZ 10-320-25 MG TABS Take 1 tablet by mouth daily.  90 tablet  1  . atorvastatin (LIPITOR) 10 MG tablet Take 1 tablet (10 mg total) by mouth daily.  90 tablet  1  . COLCRYS 0.6 MG tablet Take 1 tablet by mouth daily as needed.      . isosorbide mononitrate (IMDUR) 30 MG 24 hr tablet Take 1 tablet (30 mg total) by mouth daily.  30 tablet  3  . Nebivolol HCl 20 MG TABS Take 2 tablets (40 mg total) by mouth daily.  180 tablet  1  . spironolactone (ALDACTONE) 25 MG tablet Take 1 tablet (25 mg total) by mouth daily.  90 tablet  1   No current facility-administered medications on file prior to visit.    BP 170/110  Pulse 92  Temp(Src) 98.2 F (36.8 C) (Oral)  Ht 5\' 9"  (1.753 m)  Wt 332 lb (150.594 kg)  BMI 49.01 kg/m2    Objective:   Physical Exam  Constitutional:  Pleasant, morbidly obese 48 year old female  HENT:  Head: Normocephalic and atraumatic.  Cardiovascular: Normal rate, regular rhythm and normal heart sounds.   No murmur heard. Pulmonary/Chest: Effort normal and breath sounds normal. She has no wheezes.  Musculoskeletal: She exhibits edema.  Bilateral pitting edema  Discomfort with lumbar flexion, mild relief with lumbar extension Limited lumbar side bending  Neurological: She displays normal reflexes. No cranial nerve deficit. She exhibits normal muscle tone.  Skin: Skin is warm and dry.  Psychiatric: She has a normal mood and affect. Her behavior is normal.       Assessment & Plan:

## 2014-01-09 ENCOUNTER — Other Ambulatory Visit: Payer: 59

## 2014-01-09 ENCOUNTER — Encounter: Payer: Self-pay | Admitting: *Deleted

## 2014-01-14 ENCOUNTER — Encounter: Payer: Self-pay | Admitting: Internal Medicine

## 2014-01-14 ENCOUNTER — Ambulatory Visit (INDEPENDENT_AMBULATORY_CARE_PROVIDER_SITE_OTHER): Payer: 59 | Admitting: Internal Medicine

## 2014-01-14 VITALS — BP 146/80 | HR 99 | Temp 97.9°F | Ht 69.0 in | Wt 323.0 lb

## 2014-01-14 DIAGNOSIS — M545 Low back pain, unspecified: Secondary | ICD-10-CM

## 2014-01-14 DIAGNOSIS — F411 Generalized anxiety disorder: Secondary | ICD-10-CM

## 2014-01-14 DIAGNOSIS — I10 Essential (primary) hypertension: Secondary | ICD-10-CM

## 2014-01-14 DIAGNOSIS — G8929 Other chronic pain: Secondary | ICD-10-CM

## 2014-01-14 MED ORDER — OXYCODONE HCL 30 MG PO TABS: 30.0000 mg | ORAL_TABLET | Freq: Three times a day (TID) | ORAL | Status: DC | PRN

## 2014-01-14 MED ORDER — ALPRAZOLAM ER 0.5 MG PO TB24: 0.5000 mg | ORAL_TABLET | Freq: Two times a day (BID) | ORAL | Status: DC | PRN

## 2014-01-14 NOTE — Progress Notes (Signed)
Pre visit review using our clinic review tool, if applicable. No additional management support is needed unless otherwise documented below in the visit note. 

## 2014-01-14 NOTE — Assessment & Plan Note (Signed)
Patient advised to use alprazolam XR 0.5 mg sparingly. Patient understands not to take alprazolam within 2 hours of oxycodone or gabapentin.

## 2014-01-14 NOTE — Assessment & Plan Note (Addendum)
Chronic low back pain improved with adding gabapentin.  Patient understands to try to reduce oxycodone use over time. Patient given 2 prescriptions to last until 03/16/14.

## 2014-01-14 NOTE — Assessment & Plan Note (Signed)
BP control is improved. Continue same dose of furosemide.  Monitor BMET.  Low-salt diet encouraged. Low-salt handout provided. BP: 146/80 mmHg

## 2014-01-14 NOTE — Progress Notes (Signed)
Subjective:    Patient ID: [name], female    DOB: [birth year], 48 y.o.   MRN: [id number]  HPI  48 year old African American female with history of morbid obesity and difficult to control hypertension for followup. At previous visit Lasix 40 mg added. Patient reports increased urination and mild weight loss. Her blood pressure control also improved.  Chronic low back pain-improved with adding gabapentin 300 mg at bedtime.  Morbid obesity - her ins company will not cover bariatric surgery.  Anxiety d/o - she occasionally uses alprazolam during times of increased stress (usually work related).  Review of Systems Negative for lower extremity weakness, negative for chest pain    Past Medical History  Diagnosis Date  . History of chicken pox   . Headache(784.0)   . Hypertension   . Osteoarthritis   . Morbid obesity     History   Social History  . Marital Status: Divorced    Spouse Name: N/A    Number of Children: N/A  . 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 Smoking status: Never Smoker   . Smokeless tobacco: Never Used  . Alcohol Use: No  . Drug Use: No  . Sexual Activity: Not on file   Other Topics Concern  . Not on file   Social History Narrative   Works at [employer] for [employer]    Past Surgical History  Procedure Laterality Date  . Tubal ligation  1985    Family History  Problem Relation Age of Onset  . Arthritis Mother   . Prostate cancer Maternal Grandfather   . Stroke    . Hypertension Mother   . Breast cancer Maternal Grandfather     No Known Allergies  Current Outpatient Prescriptions on File Prior to Visit  Medication Sig Dispense Refill  . Amlodipine-Valsartan-HCTZ 10-320-25 MG TABS Take 1 tablet by mouth daily.  90 tablet  1  . atorvastatin (LIPITOR) 10 MG tablet Take 1 tablet (10 mg total) by mouth daily.  90 tablet  1  . COLCRYS 0.6 MG tablet Take 1  tablet by mouth daily as needed.      . furosemide (LASIX) 40 MG tablet Take 1 tablet (40 mg total) by mouth daily.  90 tablet  1  . gabapentin (NEURONTIN) 300 MG capsule Take 1 capsule (300 mg total) by mouth at bedtime.  90 capsule  1  . isosorbide mononitrate (IMDUR) 30 MG 24 hr tablet Take 1 tablet (30 mg total) by mouth daily.  30 tablet  3  . Nebivolol HCl 20 MG TABS Take 2 tablets (40 mg total) by mouth daily.  180 tablet  1  . spironolactone (ALDACTONE) 25 MG tablet Take 1 tablet (25 mg total) by mouth daily.  90 tablet  1   No current facility-administered medications on file prior to visit.    BP 146/80  Pulse 99  Temp(Src) 97.9 F (36.6 C) (Oral)  Ht 5\' 9"  (1.753 m)  Wt 323 lb (146.512 kg)  BMI 47.68 kg/m2    Objective:   Physical Exam  Constitutional: She is oriented to person, place, and time. She appears well-developed and well-nourished. No distress.  HENT:  Head: Normocephalic and atraumatic.  Neck: Neck supple.  Cardiovascular: Normal rate, regular rhythm and normal heart sounds.   No murmur heard. Pulmonary/Chest: Effort normal and breath sounds normal. She has no wheezes.  Musculoskeletal: She exhibits  no edema.  Lymphadenopathy:    She has no cervical adenopathy.  Neurological: She is alert and oriented to person, place, and time.  Skin: Skin is warm and dry.  Psychiatric: She has a normal mood and affect. Her behavior is normal.       Assessment & Plan:

## 2014-01-15 LAB — BASIC METABOLIC PANEL
BUN: 10 mg/dL (ref 6–23)
CO2: 27 mEq/L (ref 19–32)
Calcium: 8.9 mg/dL (ref 8.4–10.5)
Chloride: 107 mEq/L (ref 96–112)
Creatinine, Ser: 0.7 mg/dL (ref 0.4–1.2)
GFR: 110.9 mL/min (ref >60.00)
Glucose, Bld: 100 mg/dL — ABNORMAL HIGH (ref 70–99)
POTASSIUM: 3.9 meq/L (ref 3.5–5.1)
SODIUM: 139 meq/L (ref 135–145)

## 2014-01-16 ENCOUNTER — Ambulatory Visit: Payer: 59 | Admitting: Internal Medicine

## 2014-03-11 ENCOUNTER — Ambulatory Visit (INDEPENDENT_AMBULATORY_CARE_PROVIDER_SITE_OTHER): Payer: 59 | Admitting: Internal Medicine

## 2014-03-11 ENCOUNTER — Encounter: Payer: Self-pay | Admitting: Internal Medicine

## 2014-03-11 VITALS — BP 170/90 | HR 88 | Temp 98.2°F | Ht 69.0 in | Wt 332.0 lb

## 2014-03-11 DIAGNOSIS — G8929 Other chronic pain: Secondary | ICD-10-CM

## 2014-03-11 DIAGNOSIS — M545 Low back pain, unspecified: Secondary | ICD-10-CM

## 2014-03-11 DIAGNOSIS — I10 Essential (primary) hypertension: Secondary | ICD-10-CM

## 2014-03-11 DIAGNOSIS — E785 Hyperlipidemia, unspecified: Secondary | ICD-10-CM

## 2014-03-11 MED ORDER — FUROSEMIDE 40 MG PO TABS
60.0000 mg | ORAL_TABLET | Freq: Every day | ORAL | Status: DC
Start: 2014-03-11 — End: 2014-12-21

## 2014-03-11 MED ORDER — NEBIVOLOL HCL 20 MG PO TABS
40.0000 mg | ORAL_TABLET | Freq: Every day | ORAL | Status: DC
Start: 2014-03-11 — End: 2014-12-21

## 2014-03-11 MED ORDER — AMLODIPINE-VALSARTAN-HCTZ 10-320-25 MG PO TABS: 1.0000 | ORAL_TABLET | Freq: Every day | ORAL | Status: DC

## 2014-03-11 MED ORDER — SPIRONOLACTONE 25 MG PO TABS: 25.0000 mg | ORAL_TABLET | Freq: Every day | ORAL | Status: DC

## 2014-03-11 MED ORDER — ISOSORBIDE MONONITRATE ER 30 MG PO TB24
30.0000 mg | ORAL_TABLET | Freq: Every day | ORAL | Status: DC
Start: 2014-03-11 — End: 2014-07-24

## 2014-03-11 NOTE — Assessment & Plan Note (Signed)
No significant change with adding gabapentin.  She complains of diffuse achiness.  It is unclear whether her symptoms may be secondary to side effect from atorvastatin. Patient advised to hold Lipitor statin. Check CPK.  Continue same dose of oxycodone.  2 hand written prescriptions provided.

## 2014-03-11 NOTE — Progress Notes (Signed)
Subjective:    Patient ID: Joan PereyraMary A Bouska, female    DOB: December 16, 1965, 48 y.o.   MRN: [id number]  HPI  48 year old African American female with history of chronic low back pain and difficult to control hypertension for routine followup. Patient reports her home blood pressure readings elevated. Her systolic blood pressure ranges from 160-170. We reviewed her medication list in detail. She is not currently taking spironolactone and is only taking 20 mg of Bystolic.  Patient denies any associated headache or chest pain.  Chronic low back pain-no significant change. Patient is stable on current dose of oxycodone 30 mg 3 times a day.  Morbid obesity-she has gained approximately 9 pounds since previous visit.  She is also under more stress than usual.  There is a chance that she may lose her job due to downsizing.   Review of Systems Weight gain. Lower extremity swelling    Past Medical History  Diagnosis Date  . History of chicken pox   . Headache(784.0)   . Hypertension   . Osteoarthritis   . Morbid obesity     History   Social History  . Marital Status: Divorced    Spouse Name: N/A    Number of Children: N/A  . 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 Smoking status: Never Smoker   . Smokeless tobacco: Never Used  . Alcohol Use: No  . Drug Use: No  . Sexual Activity: Not on file   Other Topics Concern  . Not on file   Social History Narrative   Works at [employer] for [employer]    Past Surgical History  Procedure Laterality Date  . Tubal ligation  1985    Family History  Problem Relation Age of Onset  . Arthritis Mother   . Prostate cancer Maternal Grandfather   . Stroke    . Hypertension Mother   . Breast cancer Maternal Grandfather     No Known Allergies  Current Outpatient Prescriptions on File Prior to Visit  Medication Sig Dispense Refill  . ALPRAZolam (XANAX XR) 0.5  MG 24 hr tablet Take 1 tablet (0.5 mg total) by mouth 2 (two) times daily as needed.  60 tablet  2  . COLCRYS 0.6 MG tablet Take 1 tablet by mouth daily as needed.      . gabapentin (NEURONTIN) 300 MG capsule Take 1 capsule (300 mg total) by mouth at bedtime.  90 capsule  1  . oxycodone (ROXICODONE) 30 MG immediate release tablet Take 1 tablet (30 mg total) by mouth every 8 (eight) hours as needed for pain.  90 tablet  0   No current facility-administered medications on file prior to visit.    BP 192/100  Pulse 88  Temp(Src) 98.2 F (36.8 C) (Oral)  Ht 5\' 9"  (1.753 m)  Wt 332 lb (150.594 kg)  BMI 49.01 kg/m2     Objective:   Physical Exam  Constitutional: She is oriented to person, place, and time. She appears well-developed and well-nourished. No distress.  Cardiovascular: Normal rate, regular rhythm and normal heart sounds.   No murmur heard. Pulmonary/Chest: Effort normal and breath sounds normal.  Musculoskeletal:  + 1 pedal edema bilaterally  Neurological: She is alert and oriented to person, place, and time.  Skin: Skin is warm and dry.  Psychiatric: She has a normal mood and affect. Her behavior is normal.  Assessment & Plan:

## 2014-03-11 NOTE — Assessment & Plan Note (Signed)
Patient experiencing diffuse achiness especially at night. Unclear whether her symptoms may be side effect from atorvastatin. Discontinued atorvastatin for now. Check CPK. Reassess in 6 weeks.

## 2014-03-11 NOTE — Assessment & Plan Note (Signed)
Blood pressure is poorly controlled. Unclear whether patient is consistently taking her antihypertensives. Patient encouraged to use spironolactone 25 mg once daily. Increase furosemide to 60 mg. Patient has been taking Bystolic 20 mg instead of 40 mg. We'll perform full medication reconciliation before next office visit. Monitor electrolytes and kidney function in 2 weeks.

## 2014-03-20 ENCOUNTER — Ambulatory Visit: Payer: 59 | Admitting: Internal Medicine

## 2014-05-08 ENCOUNTER — Encounter: Payer: Self-pay | Admitting: Physician Assistant

## 2014-05-08 ENCOUNTER — Ambulatory Visit (INDEPENDENT_AMBULATORY_CARE_PROVIDER_SITE_OTHER): Payer: 59 | Admitting: Physician Assistant

## 2014-05-08 ENCOUNTER — Ambulatory Visit: Payer: 59 | Admitting: Internal Medicine

## 2014-05-08 VITALS — BP 160/90 | HR 72 | Temp 98.2°F | Resp 18 | Wt 329.0 lb

## 2014-05-08 DIAGNOSIS — G8929 Other chronic pain: Secondary | ICD-10-CM

## 2014-05-08 DIAGNOSIS — M545 Low back pain, unspecified: Secondary | ICD-10-CM

## 2014-05-08 DIAGNOSIS — I10 Essential (primary) hypertension: Secondary | ICD-10-CM

## 2014-05-08 LAB — SEDIMENTATION RATE: SED RATE: 38 mm/h — AB (ref 0–22)

## 2014-05-08 LAB — BASIC METABOLIC PANEL
BUN: 13 mg/dL (ref 6–23)
CO2: 26 meq/L (ref 19–32)
Calcium: 9.5 mg/dL (ref 8.4–10.5)
Chloride: 103 mEq/L (ref 96–112)
Creatinine, Ser: 0.9 mg/dL (ref 0.4–1.2)
GFR: 90.23 mL/min (ref >60.00)
GLUCOSE: 74 mg/dL (ref 70–99)
POTASSIUM: 4.1 meq/L (ref 3.5–5.1)
Sodium: 134 mEq/L — ABNORMAL LOW (ref 135–145)

## 2014-05-08 LAB — HEPATIC FUNCTION PANEL
ALT: 8 U/L (ref 0–35)
AST: 12 U/L (ref 0–37)
Albumin: 3.8 g/dL (ref 3.5–5.2)
Alkaline Phosphatase: 103 U/L (ref 39–117)
Bilirubin, Direct: 0 mg/dL (ref 0.0–0.3)
TOTAL PROTEIN: 7.8 g/dL (ref 6.0–8.3)
Total Bilirubin: 1.2 mg/dL (ref 0.2–1.2)

## 2014-05-08 LAB — CK: Total CK: 116 U/L (ref 7–177)

## 2014-05-08 MED ORDER — OXYCODONE HCL 30 MG PO TABS: 30.0000 mg | ORAL_TABLET | Freq: Three times a day (TID) | ORAL | Status: DC | PRN

## 2014-05-08 NOTE — Progress Notes (Signed)
Pre visit review using our clinic review tool, if applicable. No additional management support is needed unless otherwise documented below in the visit note. 

## 2014-05-08 NOTE — Assessment & Plan Note (Signed)
Controlled with Oxycodone, pt limiting dosage when possible. Continue. Needs to lose weight.

## 2014-05-08 NOTE — Assessment & Plan Note (Signed)
Uncontrolled with medications. Obesity is likely a factor, ?secondary causes. Obtain lab work. Follow up in 2 weeks with PCP.

## 2014-05-08 NOTE — Patient Instructions (Addendum)
Continue current medication regimen.  We will obtain lab work today, and call you with the results when available.  Try and get into a regular exercise regimen, as well as a healthy diet, his weight loss can have a great impact on your high blood pressure.  If emergency symptoms discussed during visit developed, seek medical attention immediately.  Followup in about 2 weeks with your PCP to reassess, or for worsening or persistent symptoms despite treatment.     Hypertension Hypertension is another name for high blood pressure. High blood pressure forces your heart to work harder to pump blood. A blood pressure reading has two numbers, which includes a higher number over a lower number (example: 110/72). HOME CARE   Have your blood pressure rechecked by your doctor.  Only take medicine as told by your doctor. Follow the directions carefully. The medicine does not work as well if you skip doses. Skipping doses also puts you at risk for problems.  Do not smoke.  Monitor your blood pressure at home as told by your doctor. GET HELP IF:  You think you are having a reaction to the medicine you are taking.  You have repeat headaches or feel dizzy.  You have puffiness (swelling) in your ankles.  You have trouble with your vision. GET HELP RIGHT AWAY IF:   You get a very bad headache and are confused.  You feel weak, numb, or faint.  You get chest or belly (abdominal) pain.  You throw up (vomit).  You cannot breathe very well. MAKE SURE YOU:   Understand these instructions.  Will watch your condition.  Will get help right away if you are not doing well or get worse. Document Released: 02/28/2008 Document Revised: 09/16/2013 Document Reviewed: 07/04/2013 [facility] Patient Information 2015 [place], [place]. This information is not intended to replace advice given to you by your health care provider. Make sure you discuss any questions you have with your health care  provider. Low-Sodium Eating Plan Sodium raises blood pressure and causes water to be held in the body. Getting less sodium from food will help lower your blood pressure, reduce any swelling, and protect your heart, liver, and kidneys. We get sodium by adding salt (sodium chloride) to food. Most of our sodium comes from canned, boxed, and frozen foods. Restaurant foods, fast foods, and pizza are also very high in sodium. Even if you take medicine to lower your blood pressure or to reduce fluid in your body, getting less sodium from your food is important. WHAT IS MY PLAN? Most people should limit their sodium intake to 2,300 mg a day. Your health care provider recommends that you limit your sodium intake to __________ a day.  WHAT DO I NEED TO KNOW ABOUT THIS EATING PLAN? For the low-sodium eating plan, you will follow these general guidelines:  Choose foods with a % Daily Value for sodium of less than 5% (as listed on the food label).   Use salt-free seasonings or herbs instead of table salt or sea salt.   Check with your health care provider or pharmacist before using salt substitutes.   Eat fresh foods.  Eat more vegetables and fruits.  Limit canned vegetables. If you do use them, rinse them well to decrease the sodium.   Limit cheese to 1 oz (28 g) per day.   Eat lower-sodium products, often labeled as "lower sodium" or "no salt added."  Avoid foods that contain monosodium glutamate (MSG). MSG is sometimes added to [place] food  and some canned foods.  Check food labels (Nutrition Facts labels) on foods to learn how much sodium is in one serving.  Eat more home-cooked food and less restaurant, buffet, and fast food.  When eating at a restaurant, ask that your food be prepared with less salt or none, if possible.  HOW DO I READ FOOD LABELS FOR SODIUM INFORMATION? The Nutrition Facts label lists the amount of sodium in one serving of the food. If you eat more than one  serving, you must multiply the listed amount of sodium by the number of servings. Food labels may also identify foods as:  Sodium free--Less than 5 mg in a serving.  Very low sodium--35 mg or less in a serving.  Low sodium--140 mg or less in a serving.  Light in sodium--50% less sodium in a serving. For example, if a food that usually has 300 mg of sodium is changed to become light in sodium, it will have 150 mg of sodium.  Reduced sodium--25% less sodium in a serving. For example, if a food that usually has 400 mg of sodium is changed to reduced sodium, it will have 300 mg of sodium. WHAT FOODS CAN I EAT? Grains Low-sodium cereals, including oats, puffed wheat and rice, and shredded wheat cereals. Low-sodium crackers. Unsalted rice and pasta. Lower-sodium bread.  Vegetables Frozen or fresh vegetables. Low-sodium or reduced-sodium canned vegetables. Low-sodium or reduced-sodium tomato sauce and paste. Low-sodium or reduced-sodium tomato and vegetable juices.  Fruits Fresh, frozen, and canned fruit. Fruit juice.  Meat and Other Protein Products Low-sodium canned tuna and salmon. Fresh or frozen meat, poultry, seafood, and fish. Lamb. Unsalted nuts. Dried beans, peas, and lentils without added salt. Unsalted canned beans. Homemade soups without salt. Eggs.  Dairy Milk. Soy milk. Ricotta cheese. Low-sodium or reduced-sodium cheeses. Yogurt.  Condiments Fresh and dried herbs and spices. Salt-free seasonings. Onion and garlic powders. Low-sodium varieties of mustard and ketchup. Lemon juice.  Fats and Oils Reduced-sodium salad dressings. Unsalted butter.  Other Unsalted popcorn and pretzels.  The items listed above may not be a complete list of recommended foods or beverages. Contact your dietitian for more options. WHAT FOODS ARE NOT RECOMMENDED? Grains Instant hot cereals. Bread stuffing, pancake, and biscuit mixes. Croutons. Seasoned rice or pasta mixes. Noodle soup cups.  Boxed or frozen macaroni and cheese. Self-rising flour. Regular salted crackers. Vegetables Regular canned vegetables. Regular canned tomato sauce and paste. Regular tomato and vegetable juices. Frozen vegetables in sauces. Salted french fries. Olives. Rosita FirePickles. Relishes. Sauerkraut. Salsa. Meat and Other Protein Products Salted, canned, smoked, spiced, or pickled meats, seafood, or fish. Bacon, ham, sausage, hot dogs, corned beef, chipped beef, and packaged luncheon meats. Salt pork. Jerky. Pickled herring. Anchovies, regular canned tuna, and sardines. Salted nuts. Dairy Processed cheese and cheese spreads. Cheese curds. Blue cheese and cottage cheese. Buttermilk.  Condiments Onion and garlic salt, seasoned salt, table salt, and sea salt. Canned and packaged gravies. Worcestershire sauce. Tartar sauce. Barbecue sauce. Teriyaki sauce. Soy sauce, including reduced sodium. Steak sauce. Fish sauce. Oyster sauce. Cocktail sauce. Horseradish. Regular ketchup and mustard. Meat flavorings and tenderizers. Bouillon cubes. Hot sauce. Tabasco sauce. Marinades. Taco seasonings. Relishes. Fats and Oils Regular salad dressings. Salted butter. Margarine. Ghee. Bacon fat.  Other Potato and tortilla chips. Corn chips and puffs. Salted popcorn and pretzels. Canned or dried soups. Pizza. Frozen entrees and pot pies.  The items listed above may not be a complete list of foods and beverages to avoid. Contact your  dietitian for more information. Document Released: 03/03/2002 Document Revised: 09/16/2013 Document Reviewed: 07/16/2013 [facility] Patient Information 2015 [place], [place]. This information is not intended to replace advice given to you by your health care provider. Make sure you discuss any questions you have with your health care provider.

## 2014-05-08 NOTE — Progress Notes (Signed)
Subjective:    Patient ID: [name], female    DOB: [birth year], 48 y.o.   MRN: [id number]  HPI Patient is a 48 y.o. female presenting for follow up. HTN- pt has very poorly controlled hypertension. She is currently taking amlodipine-valsartan-hydrochlorothiazide combination pill daily, Lasix daily, Bystolic daily, and spironolactone daily. She saw her primary care provider approximately 2 months ago and was found to have a blood pressure of 170/90 at that visit. At that time she denied taking her Spironolactone and had only been taking taking half of her prescribed dose of Bystolic. She was encouraged to take all of her medication as directed and to followup in 2 months. She states that she is taking all of her medications as prescribed and that she is tolerating them well and denies adverse effects to medication. She is still experiencing some lower extremity swelling, however this is mostly at the end of a long day of standing or sitting, and is not present in the mornings when she wakes up. Patient denies fevers, chills, nausea, vomiting, diarrhea, shortness of breath, chest pain, headache, syncope.  She also has chronic back pain, and takes Oxycodone for her back pain and knee pain. She states that she needs a knee replacement, however she has to get her BP under control prior to being able to have any surgery. Pt tolerates this medication well and denies adverse effects to treatment.   Review of Systems As per HPI and are otherwise negative.   Past Medical History  Diagnosis Date  . History of chicken pox   . Headache(784.0)   . Hypertension   . Osteoarthritis   . Morbid obesity     History   Social History  . Marital Status: Divorced    Spouse Name: N/A    Number of Children: N/A  . 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 Smoking status: Never Smoker   . Smokeless tobacco: Never Used  . Alcohol Use:  No  . Drug Use: No  . Sexual Activity: Not on file   Other Topics Concern  . Not on file   Social History Narrative   Works at [employer] for [employer]    Past Surgical History  Procedure Laterality Date  . Tubal ligation  1985    Family History  Problem Relation Age of Onset  . Arthritis Mother   . Prostate cancer Maternal Grandfather   . Stroke    . Hypertension Mother   . Breast cancer Maternal Grandfather     No Known Allergies  Current Outpatient Prescriptions on File Prior to Visit  Medication Sig Dispense Refill  . ALPRAZolam (XANAX XR) 0.5 MG 24 hr tablet Take 1 tablet (0.5 mg total) by mouth 2 (two) times daily as needed.  60 tablet  2  . Amlodipine-Valsartan-HCTZ 10-320-25 MG TABS Take 1 tablet by mouth daily.  90 tablet  1  . COLCRYS 0.6 MG tablet Take 1 tablet by mouth daily as needed.      . furosemide (LASIX) 40 MG tablet Take 1.5 tablets (60 mg total) by mouth daily.  135 tablet  1  . gabapentin (NEURONTIN) 300 MG capsule Take 1 capsule (300 mg total) by mouth at bedtime.  90 capsule  1  . isosorbide mononitrate (IMDUR) 30 MG 24 hr tablet Take 1 tablet (30 mg total) by mouth daily.  90 tablet  1  . Nebivolol  HCl 20 MG TABS Take 2 tablets (40 mg total) by mouth daily.  180 tablet  1  . spironolactone (ALDACTONE) 25 MG tablet Take 1 tablet (25 mg total) by mouth daily.  90 tablet  1   No current facility-administered medications on file prior to visit.    EXAM: BP 160/90  Pulse 72  Temp(Src) 98.2 F (36.8 C) (Oral)  Resp 18  Wt 329 lb (149.233 kg)     Objective:   Physical Exam  Nursing note and vitals reviewed. Constitutional: She is oriented to person, place, and time. She appears well-developed and well-nourished. No distress.  HENT:  Head: Normocephalic and atraumatic.  Eyes: Conjunctivae and EOM are normal. Pupils are equal, round, and reactive to light.  Cardiovascular: Normal rate, regular rhythm and intact distal pulses.     Pulmonary/Chest: Effort normal and breath sounds normal. No respiratory distress.  Musculoskeletal: She exhibits edema (1+ bilat edema).  Neurological: She is alert and oriented to person, place, and time.  Skin: Skin is warm and dry. She is not diaphoretic.  Psychiatric: She has a normal mood and affect. Her behavior is normal. Judgment and thought content normal.     Lab Results  Component Value Date   WBC 6.3 08/20/2013   HGB 12.3 08/20/2013   HCT 36.1 08/20/2013   PLT 314 08/20/2013   GLUCOSE 100* 01/14/2014   CHOL 160 07/22/2012   TRIG 50.0 07/22/2012   HDL 50.60 07/22/2012   LDLCALC 99 07/22/2012   ALT 10 07/22/2012   AST 17 07/22/2012   NA 139 01/14/2014   K 3.9 01/14/2014   CL 107 01/14/2014   CREATININE 0.7 01/14/2014   BUN 10 01/14/2014   CO2 27 01/14/2014   TSH 1.60 07/22/2012   INR 1.0 10/11/2008   HGBA1C 5.7 07/22/2012        Assessment & Plan:  [name] was seen today for 2 month follow up.  Diagnoses and associated orders for this visit:  Essential hypertension - Basic Metabolic Panel - Hepatic Function Panel - Sedimentation Rate - CK  Chronic low back pain - oxycodone (ROXICODONE) 30 MG immediate release tablet; Take 1 tablet (30 mg total) by mouth every 8 (eight) hours as needed for pain. - Basic Metabolic Panel - Hepatic Function Panel - Sedimentation Rate - CK    Encourage regular exercise and healthy, varied diet. Patient with knee osteoarthritis has difficulty with standard exercises, however has been able to tolerate water aerobics in the past, strongly encourage her doing this again.  She also had lab tests that were not done previously, but had been ordered by her PCP, we will obtain these today.  Return precautions provided, and patient handout on HTN, sodium restriction.  Plan to follow up in about 2 weeks with PCP to reassess, or for worsening or persistent symptoms despite treatment.  Patient Instructions  Continue current medication  regimen.  We will obtain lab work today, and call you with the results when available.  Try and get into a regular exercise regimen, as well as a healthy diet, his weight loss can have a great impact on your high blood pressure.  If emergency symptoms discussed during visit developed, seek medical attention immediately.  Followup in about 2 weeks with your PCP to reassess, or for worsening or persistent symptoms despite treatment.

## 2014-05-22 ENCOUNTER — Ambulatory Visit: Payer: 59 | Admitting: Internal Medicine

## 2014-06-05 ENCOUNTER — Encounter: Payer: Self-pay | Admitting: Physician Assistant

## 2014-06-05 ENCOUNTER — Ambulatory Visit: Payer: 59 | Admitting: Internal Medicine

## 2014-06-05 ENCOUNTER — Ambulatory Visit (INDEPENDENT_AMBULATORY_CARE_PROVIDER_SITE_OTHER): Payer: 59 | Admitting: Physician Assistant

## 2014-06-05 VITALS — BP 142/86 | HR 72 | Temp 98.3°F | Resp 18 | Wt 329.5 lb

## 2014-06-05 DIAGNOSIS — G8929 Other chronic pain: Secondary | ICD-10-CM

## 2014-06-05 DIAGNOSIS — M545 Low back pain, unspecified: Secondary | ICD-10-CM

## 2014-06-05 DIAGNOSIS — I10 Essential (primary) hypertension: Secondary | ICD-10-CM

## 2014-06-05 MED ORDER — OXYCODONE HCL 30 MG PO TABS: 30.0000 mg | ORAL_TABLET | Freq: Three times a day (TID) | ORAL | Status: DC | PRN

## 2014-06-05 NOTE — Patient Instructions (Addendum)
Continue your current medication regimen.  You have to try and make your oxycodone last you for an entire month, so please take as directed Max 3 times per day.   If emergency symptoms discussed during visit developed, seek medical attention immediately.  Followup 3 to 4 weeks with PCP to reassess, or for worsening or persistent symptoms despite treatment.    Hypertension Hypertension is another name for high blood pressure. High blood pressure forces your heart to work harder to pump blood. A blood pressure reading has two numbers, which includes a higher number over a lower number (example: 110/72). HOME CARE   Have your blood pressure rechecked by your doctor.  Only take medicine as told by your doctor. Follow the directions carefully. The medicine does not work as well if you skip doses. Skipping doses also puts you at risk for problems.  Do not smoke.  Monitor your blood pressure at home as told by your doctor. GET HELP IF:  You think you are having a reaction to the medicine you are taking.  You have repeat headaches or feel dizzy.  You have puffiness (swelling) in your ankles.  You have trouble with your vision. GET HELP RIGHT AWAY IF:   You get a very bad headache and are confused.  You feel weak, numb, or faint.  You get chest or belly (abdominal) pain.  You throw up (vomit).  You cannot breathe very well. MAKE SURE YOU:   Understand these instructions.  Will watch your condition.  Will get help right away if you are not doing well or get worse. Document Released: 02/28/2008 Document Revised: 09/16/2013 Document Reviewed: 07/04/2013 [facility] Patient Information 2015 [place], [place]. This information is not intended to replace advice given to you by your health care provider. Make sure you discuss any questions you have with your health care provider. Chronic Back Pain  When back pain lasts longer than 3 months, it is called chronic back pain.People with  chronic back pain often go through certain periods that are more intense (flare-ups).  CAUSES Chronic back pain can be caused by wear and tear (degeneration) on different structures in your back. These structures include:  The bones of your spine (vertebrae) and the joints surrounding your spinal cord and nerve roots (facets).  The strong, fibrous tissues that connect your vertebrae (ligaments). Degeneration of these structures may result in pressure on your nerves. This can lead to constant pain. HOME CARE INSTRUCTIONS  Avoid bending, heavy lifting, prolonged sitting, and activities which make the problem worse.  Take brief periods of rest throughout the day to reduce your pain. Lying down or standing usually is better than sitting while you are resting.  Take over-the-counter or prescription medicines only as directed by your caregiver. SEEK IMMEDIATE MEDICAL CARE IF:   You have weakness or numbness in one of your legs or feet.  You have trouble controlling your bladder or bowels.  You have nausea, vomiting, abdominal pain, shortness of breath, or fainting. Document Released: 10/19/2004 Document Revised: 12/04/2011 Document Reviewed: 08/26/2011 [facility] Patient Information 2015 [place], [place]. This information is not intended to replace advice given to you by your health care provider. Make sure you discuss any questions you have with your health care provider.

## 2014-06-05 NOTE — Progress Notes (Signed)
Subjective:    Patient ID: [name], female    DOB: Sep 12, 1966, 48 y.o.   MRN: [id number]  HPI Patient is a 48 y.o. female presenting for follow up of HTN and Chronic pain.  HTN- controlled with amlodipine-valsartan-hctz, lasix, nebivolol, and aldactone. Tolerating the medications well, denies adverse effects to treatment. Patient denies fevers, chills, nausea, vomiting, diarrhea, shortness of breath, chest pain, headache, syncope.  Chronic pain- low back and OA of knee. She currently takes oxycodone up to 4 times per day while she is at work, as this is when her pain is aggravated the most. She limits this to less often when she is resting at home. She states that she tolerates this well and denies adverse effects to treatment.  Review of Systems As per HPI and are otherwise negative.  Past Medical History  Diagnosis Date  . History of chicken pox   . Headache(784.0)   . Hypertension   . Osteoarthritis   . Morbid obesity     History   Social History  . Marital Status: Divorced    Spouse Name: N/A    Number of Children: N/A  . 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 Smoking status: Never Smoker   . Smokeless tobacco: Never Used  . Alcohol Use: No  . Drug Use: No  . Sexual Activity: Not on file   Other Topics Concern  . Not on file   Social History Narrative   Works at [employer] for [employer]    Past Surgical History  Procedure Laterality Date  . Tubal ligation  1985    Family History  Problem Relation Age of Onset  . Arthritis Mother   . Prostate cancer Maternal Grandfather   . Stroke    . Hypertension Mother   . Breast cancer Maternal Grandfather     No Known Allergies  Current Outpatient Prescriptions on File Prior to Visit  Medication Sig Dispense Refill  . ALPRAZolam (XANAX XR) 0.5 MG 24 hr tablet Take 1 tablet (0.5 mg total) by mouth 2 (two) times daily as  needed.  60 tablet  2  . Amlodipine-Valsartan-HCTZ 10-320-25 MG TABS Take 1 tablet by mouth daily.  90 tablet  1  . COLCRYS 0.6 MG tablet Take 1 tablet by mouth daily as needed.      . furosemide (LASIX) 40 MG tablet Take 1.5 tablets (60 mg total) by mouth daily.  135 tablet  1  . gabapentin (NEURONTIN) 300 MG capsule Take 1 capsule (300 mg total) by mouth at bedtime.  90 capsule  1  . isosorbide mononitrate (IMDUR) 30 MG 24 hr tablet Take 1 tablet (30 mg total) by mouth daily.  90 tablet  1  . Nebivolol HCl 20 MG TABS Take 2 tablets (40 mg total) by mouth daily.  180 tablet  1  . oxycodone (ROXICODONE) 30 MG immediate release tablet Take 1 tablet (30 mg total) by mouth every 8 (eight) hours as needed for pain.  90 tablet  0  . spironolactone (ALDACTONE) 25 MG tablet Take 1 tablet (25 mg total) by mouth daily.  90 tablet  1   No current facility-administered medications on file prior to visit.    EXAM: BP 142/86  Pulse 72  Temp(Src) 98.3 F (36.8 C) (Oral)  Resp 18  Wt 329 lb 8 oz (149.46 kg)  LMP 05/05/2014  Objective:   Physical Exam  Nursing note and vitals reviewed. Constitutional: She is oriented to person, place, and time. She appears well-developed and well-nourished. No distress.  HENT:  Head: Normocephalic and atraumatic.  Eyes: Conjunctivae and EOM are normal.  Cardiovascular: Normal rate, regular rhythm and intact distal pulses.   Pulmonary/Chest: Effort normal and breath sounds normal. No respiratory distress. She exhibits no tenderness.  Musculoskeletal: She exhibits edema (1+ bilat pitting edema).  Neurological: She is alert and oriented to person, place, and time.  Skin: Skin is warm and dry. She is not diaphoretic. No pallor.  Psychiatric: She has a normal mood and affect. Her behavior is normal. Judgment and thought content normal.     Lab Results  Component Value Date   WBC 6.3 08/20/2013   HGB 12.3 08/20/2013   HCT 36.1 08/20/2013   PLT 314  08/20/2013   GLUCOSE 74 05/08/2014   CHOL 160 07/22/2012   TRIG 50.0 07/22/2012   HDL 50.60 07/22/2012   LDLCALC 99 07/22/2012   ALT 8 05/08/2014   AST 12 05/08/2014   NA 134* 05/08/2014   K 4.1 05/08/2014   CL 103 05/08/2014   CREATININE 0.9 05/08/2014   BUN 13 05/08/2014   CO2 26 05/08/2014   TSH 1.60 07/22/2012   INR 1.0 10/11/2008   HGBA1C 5.7 07/22/2012        Assessment & Plan:  [name] was seen today for blood pressure check and medication management.  Diagnoses and associated orders for this visit:  Essential hypertension Comments: High-Normal BP today, will have pt continue current therapy, leg raises for edema.   Chronic low back pain Comments: Continue Oxycodone for now, stress that pt needs to take as directed.  - oxycodone (ROXICODONE) 30 MG immediate release tablet; Take 1 tablet (30 mg total) by mouth every 8 (eight) hours as needed for pain.    Return precautions provided, and patient handout on HTN and Chronic back pain.  Plan to follow up in 3 to 4 weeks with PCP to reassess, or for worsening or persistent symptoms despite treatment.  Patient Instructions  Continue your current medication regimen.  You have to try and make your oxycodone last you for an entire month, so please take as directed Max 3 times per day.   If emergency symptoms discussed during visit developed, seek medical attention immediately.  Followup 3 to 4 weeks with PCP to reassess, or for worsening or persistent symptoms despite treatment.

## 2014-06-05 NOTE — Progress Notes (Signed)
Pre visit review using our clinic review tool, if applicable. No additional management support is needed unless otherwise documented below in the visit note. 

## 2014-06-26 ENCOUNTER — Ambulatory Visit: Payer: 59 | Admitting: Internal Medicine

## 2014-07-03 ENCOUNTER — Encounter: Payer: Self-pay | Admitting: Physician Assistant

## 2014-07-03 ENCOUNTER — Ambulatory Visit: Payer: 59 | Admitting: Physician Assistant

## 2014-07-03 ENCOUNTER — Ambulatory Visit (INDEPENDENT_AMBULATORY_CARE_PROVIDER_SITE_OTHER): Payer: 59 | Admitting: Physician Assistant

## 2014-07-03 VITALS — BP 146/90 | HR 92 | Temp 98.4°F | Resp 18 | Wt 336.3 lb

## 2014-07-03 DIAGNOSIS — I10 Essential (primary) hypertension: Secondary | ICD-10-CM

## 2014-07-03 DIAGNOSIS — M545 Low back pain: Secondary | ICD-10-CM

## 2014-07-03 DIAGNOSIS — G8929 Other chronic pain: Secondary | ICD-10-CM

## 2014-07-03 MED ORDER — OXYCODONE HCL 30 MG PO TABS: 30.0000 mg | ORAL_TABLET | Freq: Three times a day (TID) | ORAL | Status: DC | PRN

## 2014-07-03 NOTE — Patient Instructions (Addendum)
Continue taking your current medication regimen.  Do not drive while taking Oxycodone as it will inhibit your ability to operate a motor vehicle.  Make sure to keep your legs elevated when possible to reduce swelling.  If emergency symptoms discussed during visit developed, seek medical attention immediately.  Followup with PCP in 3 to 4 weeks to reassess, or for worsening or persistent symptoms despite treatment.    Hypertension Hypertension is another name for high blood pressure. High blood pressure forces your heart to work harder to pump blood. A blood pressure reading has two numbers, which includes a higher number over a lower number (example: 110/72). HOME CARE   Have your blood pressure rechecked by your doctor.  Only take medicine as told by your doctor. Follow the directions carefully. The medicine does not work as well if you skip doses. Skipping doses also puts you at risk for problems.  Do not smoke.  Monitor your blood pressure at home as told by your doctor. GET HELP IF:  You think you are having a reaction to the medicine you are taking.  You have repeat headaches or feel dizzy.  You have puffiness (swelling) in your ankles.  You have trouble with your vision. GET HELP RIGHT AWAY IF:   You get a very bad headache and are confused.  You feel weak, numb, or faint.  You get chest or belly (abdominal) pain.  You throw up (vomit).  You cannot breathe very well. MAKE SURE YOU:   Understand these instructions.  Will watch your condition.  Will get help right away if you are not doing well or get worse. Document Released: 02/28/2008 Document Revised: 09/16/2013 Document Reviewed: 07/04/2013 [facility] Patient Information 2015 [place]. This information is not intended to replace advice given to you by your health care provider. Make sure you discuss any questions you have with your health care provider.

## 2014-07-03 NOTE — Progress Notes (Signed)
Pre visit review using our clinic review tool, if applicable. No additional management support is needed unless otherwise documented below in the visit note. 

## 2014-07-03 NOTE — Progress Notes (Signed)
Subjective:    Patient ID: [name], female    DOB: Mar 01, 1966, 48 y.o.   MRN: [id number]  Hypertension   Patient is a 48 y.o. female presenting for HTN and Chronic back/knee pain.  HTN- controlled with amlodipine-valsartan-hctz, lasix, nebivolol, and aldactone. Tolerating the medications well, denies adverse effects to treatment. The pts only complaint is that she doesn't take her lasix when going to work because it makes her use the bathroom too often. Patient denies fevers, chills, nausea, vomiting, diarrhea, shortness of breath, chest pain, headache, syncope.  Chronic pain- low back and OA of knee. She states that her Oxycodone is taken usually 3 times per day, however she sometimes takes it less if she isn't working, and on days where she works for an extended period of time, she takes up to 4 times per day. She limits this to less often when she is resting at home. She states that she tolerates this well and denies adverse effects to treatment.   Review of Systems As per HPI and are otherwise negative.    Past Medical History  Diagnosis Date  . History of chicken pox   . Headache(784.0)   . Hypertension   . Osteoarthritis   . Morbid obesity     History   Social History  . Marital Status: Divorced    Spouse Name: N/A    Number of Children: N/A  . 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 Smoking status: Never Smoker   . Smokeless tobacco: Never Used  . Alcohol Use: No  . Drug Use: No  . Sexual Activity: Not on file   Other Topics Concern  . Not on file   Social History Narrative   Works at [employer] for [employer]    Past Surgical History  Procedure Laterality Date  . Tubal ligation  1985    Family History  Problem Relation Age of Onset  . Arthritis Mother   . Prostate cancer Maternal Grandfather   . Stroke    . Hypertension Mother   . Breast cancer Maternal  Grandfather     No Known Allergies  Current Outpatient Prescriptions on File Prior to Visit  Medication Sig Dispense Refill  . ALPRAZolam (XANAX XR) 0.5 MG 24 hr tablet Take 1 tablet (0.5 mg total) by mouth 2 (two) times daily as needed.  60 tablet  2  . Amlodipine-Valsartan-HCTZ 10-320-25 MG TABS Take 1 tablet by mouth daily.  90 tablet  1  . COLCRYS 0.6 MG tablet Take 1 tablet by mouth daily as needed.      . furosemide (LASIX) 40 MG tablet Take 1.5 tablets (60 mg total) by mouth daily.  135 tablet  1  . Nebivolol HCl 20 MG TABS Take 2 tablets (40 mg total) by mouth daily.  180 tablet  1  . oxycodone (ROXICODONE) 30 MG immediate release tablet Take 1 tablet (30 mg total) by mouth every 8 (eight) hours as needed for pain.  90 tablet  0  . spironolactone (ALDACTONE) 25 MG tablet Take 1 tablet (25 mg total) by mouth daily.  90 tablet  1  . gabapentin (NEURONTIN) 300 MG capsule Take 1 capsule (300 mg total) by mouth at bedtime.  90 capsule  1  . isosorbide mononitrate (IMDUR) 30 MG 24 hr tablet Take 1 tablet (30 mg total) by mouth daily.  90 tablet  1  No current facility-administered medications on file prior to visit.    EXAM: BP 146/90  Pulse 92  Temp(Src) 98.4 F (36.9 C) (Oral)  Resp 18  Wt 336 lb 4.8 oz (152.545 kg)  SpO2 98%  LMP 06/07/2014     Objective:   Physical Exam  Nursing note and vitals reviewed. Constitutional: She is oriented to person, place, and time. She appears well-developed and well-nourished. No distress.  HENT:  Head: Normocephalic and atraumatic.  Eyes: Conjunctivae and EOM are normal.  Cardiovascular: Normal rate, regular rhythm and intact distal pulses.   Pulmonary/Chest: Effort normal and breath sounds normal. No respiratory distress. She exhibits no tenderness.  Musculoskeletal: She exhibits edema (1+ bilat LE edema).  Neurological: She is alert and oriented to person, place, and time.  Skin: Skin is warm and dry. She is not diaphoretic. No  pallor.  Psychiatric: She has a normal mood and affect. Her behavior is normal. Judgment and thought content normal.     Lab Results  Component Value Date   WBC 6.3 08/20/2013   HGB 12.3 08/20/2013   HCT 36.1 08/20/2013   PLT 314 08/20/2013   GLUCOSE 74 05/08/2014   CHOL 160 07/22/2012   TRIG 50.0 07/22/2012   HDL 50.60 07/22/2012   LDLCALC 99 07/22/2012   ALT 8 05/08/2014   AST 12 05/08/2014   NA 134* 05/08/2014   K 4.1 05/08/2014   CL 103 05/08/2014   CREATININE 0.9 05/08/2014   BUN 13 05/08/2014   CO2 26 05/08/2014   TSH 1.60 07/22/2012   INR 1.0 10/11/2008   HGBA1C 5.7 07/22/2012        Assessment & Plan:  [name] was seen today for hypertension.  Diagnoses and associated orders for this visit:  Essential hypertension Comments: Continue present regimen. Continue leg raises. F/u in 3 to 4 weeks.  Chronic low back pain Comments: Continue current regimen as directed. Follow up in 3-4 weeks. - oxycodone (ROXICODONE) 30 MG immediate release tablet; Take 1 tablet (30 mg total) by mouth every 8 (eight) hours as needed for pain.    Spoke with Dr. Amador CunasKwiatkowski regarding the pts persistently elevated bp and potential non-compliance with Lasix due to increased need to urinate. I agree with his recommendation to have pt try taking her lasix at a different time of day in order to make this more tolerable, and discussed this with the pt. Two possibilities would be to take half dose before work and half dose after work, or to switch to taking dose in the evenings if pt can tolerate potential increase of need to urinate during the night. The pt is amenable to this plan.  Return precautions provided, and patient handout on HTN.  Plan to follow up in 3 to 4 weeks to reassess, or for worsening or persistent symptoms despite treatment.  Patient Instructions  Continue taking your current medication regimen.  Do not drive while taking Oxycodone as it will inhibit your ability to operate a motor  vehicle.  Make sure to keep your legs elevated when possible to reduce swelling.  If emergency symptoms discussed during visit developed, seek medical attention immediately.  Followup with PCP in 3 to 4 weeks to reassess, or for worsening or persistent symptoms despite treatment.

## 2014-07-06 ENCOUNTER — Telehealth: Payer: Self-pay | Admitting: Internal Medicine

## 2014-07-06 NOTE — Telephone Encounter (Signed)
emmi emailed °

## 2014-07-24 ENCOUNTER — Encounter: Payer: Self-pay | Admitting: Internal Medicine

## 2014-07-24 ENCOUNTER — Ambulatory Visit (INDEPENDENT_AMBULATORY_CARE_PROVIDER_SITE_OTHER): Payer: 59 | Admitting: Internal Medicine

## 2014-07-24 VITALS — BP 182/104 | HR 100 | Temp 98.4°F | Ht 69.0 in | Wt 323.0 lb

## 2014-07-24 DIAGNOSIS — G4710 Hypersomnia, unspecified: Secondary | ICD-10-CM

## 2014-07-24 DIAGNOSIS — M791 Myalgia, unspecified site: Secondary | ICD-10-CM

## 2014-07-24 DIAGNOSIS — M545 Low back pain: Secondary | ICD-10-CM

## 2014-07-24 DIAGNOSIS — G8929 Other chronic pain: Secondary | ICD-10-CM

## 2014-07-24 DIAGNOSIS — R400 Somnolence: Secondary | ICD-10-CM

## 2014-07-24 DIAGNOSIS — I10 Essential (primary) hypertension: Secondary | ICD-10-CM

## 2014-07-24 LAB — CK: CK TOTAL: 141 U/L (ref 7–177)

## 2014-07-24 LAB — SEDIMENTATION RATE: SED RATE: 35 mm/h — AB (ref 0–22)

## 2014-07-24 MED ORDER — OXYCODONE HCL 30 MG PO TABS: 30.0000 mg | ORAL_TABLET | Freq: Three times a day (TID) | ORAL | Status: DC | PRN

## 2014-07-24 MED ORDER — HYDRALAZINE HCL 25 MG PO TABS: 25.0000 mg | ORAL_TABLET | Freq: Two times a day (BID) | ORAL | Status: DC

## 2014-07-24 NOTE — Progress Notes (Signed)
Pre visit review using our clinic review tool, if applicable. No additional management support is needed unless otherwise documented below in the visit note. 

## 2014-07-24 NOTE — Assessment & Plan Note (Signed)
Check CPK and sed rate.

## 2014-07-24 NOTE — Assessment & Plan Note (Addendum)
Patient has difficult to control hypertension despite multiple antihypertensives. Patient complains of daytime somnolence and fatigue. Patient high risk for obstructive sleep apnea. Arrange in-home sleep study.  Her serum free metanephrines were unremarkable in February 2015.  Patient previously sent for renal artery Doppler but never completed.  Reorder renal artery doppler.  Serum aldosterone to renin ratio may not be helpful at this time because she is on spironolactone.  Add hydralazine 25 mg twice daily  BP: 182/104 mmHg

## 2014-07-24 NOTE — Progress Notes (Signed)
Subjective:    Patient ID: Joan PereyraMary A Cuttino, female    DOB: Jan 09, 1966, 48 y.o.   MRN: [id number]  HPI  48 year old African American female with history of chronic low back pain, knee pain and difficult to control hypertension for follow-up.  Difficult to control hypertension-patient reports taking all of her medications today. Patient reports she was in a rush because she takes care of her 2 grandchildren.  Chronic low back pain and knee pain-patient taking same dose of oxycodone. She uses less when she is not at work. Patient considering knee replacement. She has follow-up visit with her orthopedic specialist.  She complains of chronic fatigue and daytime somnolence.  "I can fall asleep at any time" Review of Systems Negative for chest pain, negative headache She complains of diffuse achiness   Past Medical History  Diagnosis Date  . History of chicken pox   . Headache(784.0)   . Hypertension   . Osteoarthritis   . Morbid obesity     History   Social History  . Marital Status: Divorced    Spouse Name: N/A    Number of Children: N/A  . 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 Smoking status: Never Smoker   . Smokeless tobacco: Never Used  . Alcohol Use: No  . Drug Use: No  . Sexual Activity: Not on file   Other Topics Concern  . Not on file   Social History Narrative   Works at [employer] for [employer]    Past Surgical History  Procedure Laterality Date  . Tubal ligation  1985    Family History  Problem Relation Age of Onset  . Arthritis Mother   . Prostate cancer Maternal Grandfather   . Stroke    . Hypertension Mother   . Breast cancer Maternal Grandfather     No Known Allergies  Current Outpatient Prescriptions on File Prior to Visit  Medication Sig Dispense Refill  . ALPRAZolam (XANAX XR) 0.5 MG 24 hr tablet Take 1 tablet (0.5 mg total) by mouth 2 (two) times daily as  needed.  60 tablet  2  . Amlodipine-Valsartan-HCTZ 10-320-25 MG TABS Take 1 tablet by mouth daily.  90 tablet  1  . COLCRYS 0.6 MG tablet Take 1 tablet by mouth daily as needed.      . furosemide (LASIX) 40 MG tablet Take 1.5 tablets (60 mg total) by mouth daily.  135 tablet  1  . Nebivolol HCl 20 MG TABS Take 2 tablets (40 mg total) by mouth daily.  180 tablet  1  . spironolactone (ALDACTONE) 25 MG tablet Take 1 tablet (25 mg total) by mouth daily.  90 tablet  1   No current facility-administered medications on file prior to visit.    BP 182/104  Pulse 100  Temp(Src) 98.4 F (36.9 C) (Oral)  Ht 5\' 9"  (1.753 m)  Wt 323 lb (146.512 kg)  BMI 47.68 kg/m2  LMP 06/07/2014     Objective:   Physical Exam  Constitutional: She is oriented to person, place, and time. She appears well-developed and well-nourished.  HENT:  Head: Normocephalic and atraumatic.  Neck: Normal range of motion. Neck supple.  Cardiovascular: Normal rate, regular rhythm and normal heart sounds.   No murmur heard. Pulmonary/Chest: Effort normal. She has no wheezes.  Neurological: She is alert and oriented to person, place, and time. No cranial nerve deficit.  Skin: Skin  is warm and dry.  Psychiatric: She has a normal mood and affect. Her behavior is normal.          Assessment & Plan:

## 2014-07-24 NOTE — Assessment & Plan Note (Signed)
No significant change. Continue oxycodone for now. Obtain urine drug screen.

## 2014-07-30 ENCOUNTER — Ambulatory Visit: Payer: 59 | Admitting: Internal Medicine

## 2014-07-30 ENCOUNTER — Encounter (HOSPITAL_COMMUNITY): Payer: 59

## 2014-07-30 DIAGNOSIS — R0989 Other specified symptoms and signs involving the circulatory and respiratory systems: Secondary | ICD-10-CM

## 2014-07-31 ENCOUNTER — Encounter: Payer: Self-pay | Admitting: *Deleted

## 2014-07-31 ENCOUNTER — Ambulatory Visit (HOSPITAL_COMMUNITY): Payer: 59 | Attending: Internal Medicine | Admitting: *Deleted

## 2014-07-31 DIAGNOSIS — E785 Hyperlipidemia, unspecified: Secondary | ICD-10-CM | POA: Diagnosis not present

## 2014-07-31 DIAGNOSIS — I10 Essential (primary) hypertension: Secondary | ICD-10-CM | POA: Diagnosis present

## 2014-07-31 NOTE — Progress Notes (Signed)
Renal Artery Duplex Performed 

## 2014-08-18 ENCOUNTER — Encounter: Payer: Self-pay | Admitting: Internal Medicine

## 2014-09-22 ENCOUNTER — Telehealth: Payer: Self-pay | Admitting: Internal Medicine

## 2014-09-22 DIAGNOSIS — G8929 Other chronic pain: Secondary | ICD-10-CM

## 2014-09-22 DIAGNOSIS — M545 Low back pain, unspecified: Secondary | ICD-10-CM

## 2014-09-22 MED ORDER — ALPRAZOLAM ER 0.5 MG PO TB24: 0.5000 mg | ORAL_TABLET | Freq: Two times a day (BID) | ORAL | Status: DC | PRN

## 2014-09-22 MED ORDER — OXYCODONE HCL 30 MG PO TABS: 30.0000 mg | ORAL_TABLET | Freq: Three times a day (TID) | ORAL | Status: DC | PRN

## 2014-09-22 NOTE — Telephone Encounter (Signed)
Pt request refill  oxycodone (ROXICODONE) 30 MG immediate release ALPRAZolam (XANAX XR) 0.5 MG 24 hr tablet  Pt has appt 10/05/14 but will be out of meds prior to that date.  Will he refill prior to that?  [facility]/ [address]

## 2014-09-22 NOTE — Telephone Encounter (Signed)
Ok for RF x 1 

## 2014-09-23 ENCOUNTER — Ambulatory Visit: Payer: 59 | Admitting: Internal Medicine

## 2014-09-23 NOTE — Telephone Encounter (Signed)
Xanax called into pharmacy.

## 2014-09-23 NOTE — Telephone Encounter (Signed)
Dr Kirtland BouchardK signed rx.  Its up front for p/u, pt aware

## 2014-10-05 ENCOUNTER — Ambulatory Visit: Payer: 59 | Admitting: Internal Medicine

## 2014-10-23 ENCOUNTER — Telehealth: Payer: Self-pay | Admitting: Internal Medicine

## 2014-10-23 DIAGNOSIS — G8929 Other chronic pain: Secondary | ICD-10-CM

## 2014-10-23 DIAGNOSIS — M545 Low back pain: Principal | ICD-10-CM

## 2014-10-23 NOTE — Telephone Encounter (Signed)
Pt needs new rx oxycodone °

## 2014-10-25 NOTE — Telephone Encounter (Signed)
Ok to RF x 1

## 2014-10-26 MED ORDER — OXYCODONE HCL 30 MG PO TABS: 30.0000 mg | ORAL_TABLET | Freq: Three times a day (TID) | ORAL | Status: DC | PRN

## 2014-10-27 NOTE — Telephone Encounter (Signed)
Pt aware.

## 2014-11-17 ENCOUNTER — Other Ambulatory Visit: Payer: Self-pay | Admitting: Orthopedic Surgery

## 2014-11-17 DIAGNOSIS — M5442 Lumbago with sciatica, left side: Secondary | ICD-10-CM

## 2014-11-17 DIAGNOSIS — M5136 Other intervertebral disc degeneration, lumbar region: Secondary | ICD-10-CM

## 2014-11-20 ENCOUNTER — Telehealth: Payer: Self-pay | Admitting: Internal Medicine

## 2014-11-20 DIAGNOSIS — M545 Low back pain: Principal | ICD-10-CM

## 2014-11-20 DIAGNOSIS — G8929 Other chronic pain: Secondary | ICD-10-CM

## 2014-11-20 MED ORDER — OXYCODONE HCL 30 MG PO TABS: 30.0000 mg | ORAL_TABLET | Freq: Three times a day (TID) | ORAL | Status: DC | PRN

## 2014-11-20 NOTE — Telephone Encounter (Signed)
RF x 1 only.  Needs OV for more refills

## 2014-11-20 NOTE — Telephone Encounter (Signed)
° ° ° °  Pt request refill of the following: ° ° °oxycodone (ROXICODONE) 30 MG immediate release tablet ° ° °Phamacy: °

## 2014-11-20 NOTE — Telephone Encounter (Signed)
Tried to leave a message- mailbox full

## 2014-11-20 NOTE — Telephone Encounter (Signed)
[name] spoke with pt

## 2014-12-03 ENCOUNTER — Ambulatory Visit
Admission: RE | Admit: 2014-12-03 | Discharge: 2014-12-03 | Disposition: A | Discharge status: Home / Self Care | Payer: 59 | Source: Ambulatory Visit | Attending: Orthopedic Surgery | Admitting: Orthopedic Surgery

## 2014-12-03 DIAGNOSIS — M5136 Other intervertebral disc degeneration, lumbar region: Secondary | ICD-10-CM

## 2014-12-03 DIAGNOSIS — M5442 Lumbago with sciatica, left side: Secondary | ICD-10-CM

## 2014-12-17 ENCOUNTER — Other Ambulatory Visit: Payer: Self-pay | Admitting: Orthopedic Surgery

## 2014-12-17 DIAGNOSIS — M5416 Radiculopathy, lumbar region: Secondary | ICD-10-CM

## 2014-12-17 DIAGNOSIS — M79605 Pain in left leg: Secondary | ICD-10-CM

## 2014-12-21 ENCOUNTER — Ambulatory Visit (INDEPENDENT_AMBULATORY_CARE_PROVIDER_SITE_OTHER): Payer: 59 | Admitting: Internal Medicine

## 2014-12-21 ENCOUNTER — Encounter: Payer: Self-pay | Admitting: Internal Medicine

## 2014-12-21 VITALS — BP 176/90 | HR 104 | Temp 99.5°F | Ht 69.0 in | Wt 330.0 lb

## 2014-12-21 DIAGNOSIS — I10 Essential (primary) hypertension: Secondary | ICD-10-CM | POA: Diagnosis not present

## 2014-12-21 DIAGNOSIS — M545 Low back pain, unspecified: Secondary | ICD-10-CM

## 2014-12-21 DIAGNOSIS — E785 Hyperlipidemia, unspecified: Secondary | ICD-10-CM | POA: Diagnosis not present

## 2014-12-21 DIAGNOSIS — G8929 Other chronic pain: Secondary | ICD-10-CM

## 2014-12-21 LAB — HEPATIC FUNCTION PANEL
ALT: 11 U/L (ref 0–35)
AST: 15 U/L (ref 0–37)
Albumin: 3.7 g/dL (ref 3.5–5.2)
Alkaline Phosphatase: 96 U/L (ref 39–117)
Bilirubin, Direct: 0.1 mg/dL (ref 0.0–0.3)
Total Bilirubin: 0.4 mg/dL (ref 0.2–1.2)
Total Protein: 7.5 g/dL (ref 6.0–8.3)

## 2014-12-21 LAB — BASIC METABOLIC PANEL
BUN: 10 mg/dL (ref 6–23)
CHLORIDE: 105 meq/L (ref 96–112)
CO2: 30 mEq/L (ref 19–32)
Calcium: 9.3 mg/dL (ref 8.4–10.5)
Creatinine, Ser: 0.92 mg/dL (ref 0.40–1.20)
GFR: 83.26 mL/min (ref >60.00)
GLUCOSE: 95 mg/dL (ref 70–99)
POTASSIUM: 4.1 meq/L (ref 3.5–5.1)
Sodium: 138 mEq/L (ref 135–145)

## 2014-12-21 LAB — LIPID PANEL
Cholesterol: 162 mg/dL (ref 0–200)
HDL: 54.5 mg/dL (ref >39.00)
LDL Cholesterol: 94 mg/dL (ref 0–99)
NonHDL: 107.5
Total CHOL/HDL Ratio: 3
Triglycerides: 67 mg/dL (ref 0.0–149.0)
VLDL: 13.4 mg/dL (ref 0.0–40.0)

## 2014-12-21 LAB — HIGH SENSITIVITY CRP: CRP, High Sensitivity: 1.86 mg/L (ref 0.000–5.000)

## 2014-12-21 LAB — TSH: TSH: 0.95 u[IU]/mL (ref 0.35–4.50)

## 2014-12-21 MED ORDER — OXYCODONE HCL 30 MG PO TABS: 30.0000 mg | ORAL_TABLET | Freq: Three times a day (TID) | ORAL | Status: DC | PRN

## 2014-12-21 MED ORDER — SPIRONOLACTONE 25 MG PO TABS: 25.0000 mg | ORAL_TABLET | Freq: Every day | ORAL | Status: DC

## 2014-12-21 MED ORDER — AMLODIPINE-VALSARTAN-HCTZ 10-320-25 MG PO TABS: 1.0000 | ORAL_TABLET | Freq: Every day | ORAL | Status: DC

## 2014-12-21 MED ORDER — HYDRALAZINE HCL 25 MG PO TABS: 25.0000 mg | ORAL_TABLET | Freq: Three times a day (TID) | ORAL | Status: DC

## 2014-12-21 MED ORDER — FUROSEMIDE 40 MG PO TABS: 60.0000 mg | ORAL_TABLET | Freq: Every day | ORAL | Status: DC

## 2014-12-21 MED ORDER — NEBIVOLOL HCL 20 MG PO TABS: 40.0000 mg | ORAL_TABLET | Freq: Every day | ORAL | Status: DC

## 2014-12-21 NOTE — Assessment & Plan Note (Signed)
BP poorly controlled.  ?? Related to medication compliance.  Increase hydralazine to 25 mg tid.  BP: (!) 176/90 mmHg

## 2014-12-21 NOTE — Progress Notes (Signed)
Pre visit review using our clinic review tool, if applicable. No additional management support is needed unless otherwise documented below in the visit note. 

## 2014-12-21 NOTE — Progress Notes (Signed)
   Subjective:    Patient ID: [name], female    DOB: [birth year], 49 y.o.   MRN: [id number]  HPI  49 year old estimated female with history of difficult to control hypertension, chronic back pain and knee pain for follow-up. Interval medical history-patient seen by her orthopedic specialist. Patient now on disability due to severe osteoarthritis of her knees. She is also planning on repeat MRI of lumbar spine.  She is on usual dose of oxycodone.  Hypertension-she reports good medication compliance. Her blood pressure is elevated.  She is also taking mobic twice daily.  Review of Systems Negative for chest pain, negative for headache    Past Medical History  Diagnosis Date  . History of chicken pox   . Headache(784.0)   . Hypertension   . Osteoarthritis   . Morbid obesity     History   Social History  . Marital Status: Divorced    Spouse Name: N/A  . Number of Children: N/A  . 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 Smoking status: Never Smoker   . Smokeless tobacco: Never Used  . Alcohol Use: No  . Drug Use: No  . Sexual Activity: Not on file   Other Topics Concern  . Not on file   Social History Narrative   Works at [employer] for [employer]    Past Surgical History  Procedure Laterality Date  . Tubal ligation  1985    Family History  Problem Relation Age of Onset  . Arthritis Mother   . Prostate cancer Maternal Grandfather   . Stroke    . Hypertension Mother   . Breast cancer Maternal Grandfather     No Known Allergies  Current Outpatient Prescriptions on File Prior to Visit  Medication Sig Dispense Refill  . ALPRAZolam (XANAX XR) 0.5 MG 24 hr tablet Take 1 tablet (0.5 mg total) by mouth 2 (two) times daily as needed. 60 tablet 0  . COLCRYS 0.6 MG tablet Take 1 tablet by mouth daily as needed.     No current facility-administered medications on file prior to visit.      BP 176/90 mmHg  Pulse 104  Temp(Src) 99.5 F (37.5 C) (Oral)  Ht 5\' 9"  (1.753 m)  Wt 330 lb (149.687 kg)  BMI 48.71 kg/m2    Objective:   Physical Exam  Constitutional: She is oriented to person, place, and time. She appears well-developed and well-nourished. No distress.  HENT:  Head: Normocephalic and atraumatic.  Neck: Neck supple.  Cardiovascular: Normal rate, regular rhythm and normal heart sounds.   No murmur heard. Pulmonary/Chest: Effort normal and breath sounds normal. She has no wheezes.  Musculoskeletal: She exhibits no edema.  Lymphadenopathy:    She has no cervical adenopathy.  Neurological: She is alert and oriented to person, place, and time. No cranial nerve deficit.  Psychiatric: She has a normal mood and affect. Her behavior is normal.          Assessment & Plan:

## 2014-12-21 NOTE — Assessment & Plan Note (Addendum)
Patient followed by orthopedics. Repeat MRI of lumbar spine plan. Continue same dose of oxycodone.  Monitor renal function with patient taking mobic.

## 2014-12-24 ENCOUNTER — Other Ambulatory Visit: Payer: Self-pay | Admitting: *Deleted

## 2014-12-24 MED ORDER — ATORVASTATIN CALCIUM 20 MG PO TABS: 20.0000 mg | ORAL_TABLET | Freq: Every day | ORAL | Status: DC

## 2014-12-28 ENCOUNTER — Ambulatory Visit
Admission: RE | Admit: 2014-12-28 | Discharge: 2014-12-28 | Disposition: A | Discharge status: Home / Self Care | Payer: 59 | Source: Ambulatory Visit | Attending: Orthopedic Surgery | Admitting: Orthopedic Surgery

## 2014-12-28 DIAGNOSIS — M79605 Pain in left leg: Secondary | ICD-10-CM

## 2014-12-28 DIAGNOSIS — M545 Low back pain, unspecified: Secondary | ICD-10-CM

## 2014-12-28 DIAGNOSIS — G8929 Other chronic pain: Secondary | ICD-10-CM

## 2014-12-28 DIAGNOSIS — M5416 Radiculopathy, lumbar region: Secondary | ICD-10-CM

## 2014-12-28 MED ORDER — DIAZEPAM 5 MG PO TABS
10.0000 mg | ORAL_TABLET | Freq: Once | ORAL | Status: AC
  Administered 2014-12-28: 10 mg via ORAL

## 2014-12-28 MED ORDER — METHYLPREDNISOLONE ACETATE 40 MG/ML INJ SUSP (RADIOLOG
120.0000 mg | Freq: Once | INTRAMUSCULAR | Status: AC
  Administered 2014-12-28: 120 mg via EPIDURAL

## 2014-12-28 MED ORDER — IOHEXOL 180 MG/ML  SOLN
1.0000 mL | Freq: Once | INTRAMUSCULAR | Status: AC | PRN
  Administered 2014-12-28: 1 mL via EPIDURAL

## 2014-12-28 NOTE — Discharge Instructions (Signed)

## 2015-01-08 ENCOUNTER — Encounter: Payer: Self-pay | Admitting: Internal Medicine

## 2015-01-18 ENCOUNTER — Other Ambulatory Visit: Payer: Self-pay | Admitting: Orthopedic Surgery

## 2015-01-18 DIAGNOSIS — M5416 Radiculopathy, lumbar region: Secondary | ICD-10-CM

## 2015-01-20 ENCOUNTER — Encounter: Payer: Self-pay | Admitting: Internal Medicine

## 2015-01-21 ENCOUNTER — Emergency Department (HOSPITAL_COMMUNITY)
Admission: EM | Admit: 2015-01-21 | Discharge: 2015-01-21 | Disposition: A | Discharge status: Home / Self Care | Payer: 59 | Attending: Emergency Medicine | Admitting: Emergency Medicine

## 2015-01-21 ENCOUNTER — Emergency Department (HOSPITAL_COMMUNITY): Payer: 59

## 2015-01-21 ENCOUNTER — Telehealth: Payer: Self-pay

## 2015-01-21 ENCOUNTER — Encounter (HOSPITAL_COMMUNITY): Payer: Self-pay

## 2015-01-21 DIAGNOSIS — R0989 Other specified symptoms and signs involving the circulatory and respiratory systems: Secondary | ICD-10-CM

## 2015-01-21 DIAGNOSIS — R0602 Shortness of breath: Secondary | ICD-10-CM

## 2015-01-21 DIAGNOSIS — M1990 Unspecified osteoarthritis, unspecified site: Secondary | ICD-10-CM | POA: Diagnosis not present

## 2015-01-21 DIAGNOSIS — R000 Tachycardia, unspecified: Secondary | ICD-10-CM | POA: Insufficient documentation

## 2015-01-21 DIAGNOSIS — J811 Chronic pulmonary edema: Secondary | ICD-10-CM | POA: Insufficient documentation

## 2015-01-21 DIAGNOSIS — Z8619 Personal history of other infectious and parasitic diseases: Secondary | ICD-10-CM | POA: Diagnosis not present

## 2015-01-21 DIAGNOSIS — Z79899 Other long term (current) drug therapy: Secondary | ICD-10-CM | POA: Insufficient documentation

## 2015-01-21 DIAGNOSIS — Z791 Long term (current) use of non-steroidal anti-inflammatories (NSAID): Secondary | ICD-10-CM | POA: Insufficient documentation

## 2015-01-21 DIAGNOSIS — I10 Essential (primary) hypertension: Secondary | ICD-10-CM | POA: Diagnosis not present

## 2015-01-21 LAB — CBC WITH DIFFERENTIAL/PLATELET
Basophils Absolute: 0 10*3/uL (ref 0.0–0.1)
Basophils Relative: 0 % (ref 0–1)
EOS ABS: 0.3 10*3/uL (ref 0.0–0.7)
EOS PCT: 3 % (ref 0–5)
HCT: 34.4 % — ABNORMAL LOW (ref 36.0–46.0)
HEMOGLOBIN: 11.2 g/dL — AB (ref 12.0–15.0)
LYMPHS ABS: 2.2 10*3/uL (ref 0.7–4.0)
Lymphocytes Relative: 24 % (ref 12–46)
MCH: 29.7 pg (ref 26.0–34.0)
MCHC: 32.6 g/dL (ref 30.0–36.0)
MCV: 91.2 fL (ref 78.0–100.0)
MONOS PCT: 7 % (ref 3–12)
Monocytes Absolute: 0.6 10*3/uL (ref 0.1–1.0)
Neutro Abs: 5.9 10*3/uL (ref 1.7–7.7)
Neutrophils Relative %: 66 % (ref 43–77)
Platelets: 261 10*3/uL (ref 150–400)
RBC: 3.77 MIL/uL — ABNORMAL LOW (ref 3.87–5.11)
RDW: 13.9 % (ref 11.5–15.5)
WBC: 9 10*3/uL (ref 4.0–10.5)

## 2015-01-21 LAB — BASIC METABOLIC PANEL
Anion gap: 7 (ref 5–15)
BUN: 10 mg/dL (ref 6–23)
CO2: 26 mmol/L (ref 19–32)
Calcium: 8.6 mg/dL (ref 8.4–10.5)
Chloride: 105 mmol/L (ref 96–112)
Creatinine, Ser: 0.85 mg/dL (ref 0.50–1.10)
GFR, EST NON AFRICAN AMERICAN: 79 mL/min — AB (ref >90)
GLUCOSE: 94 mg/dL (ref 70–99)
POTASSIUM: 3.7 mmol/L (ref 3.5–5.1)
SODIUM: 138 mmol/L (ref 135–145)

## 2015-01-21 LAB — TROPONIN I: Troponin I: <0.03 ng/mL (ref <0.031)

## 2015-01-21 LAB — BRAIN NATRIURETIC PEPTIDE: B Natriuretic Peptide: 54.8 pg/mL (ref 0.0–100.0)

## 2015-01-21 MED ORDER — FUROSEMIDE 40 MG PO TABS: 60.0000 mg | ORAL_TABLET | Freq: Every day | ORAL | Status: DC

## 2015-01-21 MED ORDER — ALBUTEROL SULFATE HFA 108 (90 BASE) MCG/ACT IN AERS
2.0000 | INHALATION_SPRAY | Freq: Once | RESPIRATORY_TRACT | Status: AC
  Administered 2015-01-21: 2 via RESPIRATORY_TRACT
  Filled 2015-01-21: qty 6.7

## 2015-01-21 MED ORDER — FUROSEMIDE 40 MG PO TABS: 40.0000 mg | ORAL_TABLET | Freq: Two times a day (BID) | ORAL | Status: DC

## 2015-01-21 MED ORDER — FUROSEMIDE 10 MG/ML IJ SOLN
40.0000 mg | Freq: Once | INTRAMUSCULAR | Status: AC
  Administered 2015-01-21: 40 mg via INTRAVENOUS
  Filled 2015-01-21: qty 4

## 2015-01-21 NOTE — Discharge Instructions (Signed)
You were seen today for shortness of breath. You likely have some fluid on her lungs. Your Lasix will be increased for the next 3 days to 40 mg twice daily. You need to follow-up with her primary physician on Monday. If he has any new or worsening symptoms over the weekend, you should return for further evaluation.  Pulmonary Edema Pulmonary edema (PE) is a condition in which fluid collects in the lungs. This makes it hard to breathe. PE may be a result of the heart not pumping very well or a result of injury.  CAUSES   Coronary artery disease causes blockages in the arteries of the heart. This deprives the heart muscle of oxygen and weakens the muscle. A heart attack is a form of coronary artery disease.  High blood pressure causes the heart muscle to work harder than usual. Over time, the heart muscle may get stiff, and it starts to work less efficiently. It may also fatigue and weaken.  Viral infection of the heart (myocarditis) may weaken the heart muscle.  Metabolic conditions such as thyroid disease, excessive alcohol use, certain vitamin deficiencies, or diabetes may also weaken the heart muscle.  Leaky or stiff heart valves may impair normal heart function.  Lung disease may strain the heart muscle.  Excessive demands on the heart such as too much salt or fluid intake.  Failure to take prescribed medicines.  Lung injury from heat or toxins, such as poisonous gas.  Infection in the lungs or other parts of the body.  Fluid overload caused by kidney failure or medicines. SYMPTOMS   Shortness of breath at rest or with exertion.  Grunting, wheezing, or gurgling while breathing.  Feeling like you cannot get enough air.  Breaths are shallow and fast.  A lot of coughing with frothy or bloody mucus.  Skin may become cool, damp, and turn a pale or bluish color. DIAGNOSIS  Initial diagnosis may be based on your history, symptoms, and a physical examination. Additional tests for  PE may include:  Electrocardiography.  Chest X-ray.  Blood tests.  Stress test.  Ultrasound evaluation of the heart (echocardiography).  Evaluation by a heart doctor (cardiologist).  Test of the heart arteries to look for blockages (angiography).  Check of blood oxygen. TREATMENT  Treatment of PE will depend on the underlying cause and will focus first on relieving the symptoms.   Extra oxygen to make breathing easier and assist with removing mucus. This may include breathing treatments or a tube into the lungs and a breathing machine.  Medicine to help the body get rid of extra water, usually through an IV tube.  Medicine to help the heart pump better.  If poor heart function is the cause, treatment may include:  Procedures to open blocked arteries, repair damaged heart valves, or remove some of the damaged heart muscle.  A pacemaker to help the heart pump with less effort. HOME CARE INSTRUCTIONS   Your health care provider will help you determine what type of exercise program may be helpful. It is important to maintain strength and increase it if possible. Pace your activities to avoid shortness of breath or chest pain. Rest for at least 1 hour before and after meals. Cardiac rehabilitation programs are available in some locations.  Eat a heart-healthy diet low in salt, saturated fat, and cholesterol. Ask for help with choices.  Make a list of every medicine, vitamin, or herbal supplement you are taking. Keep the list with you at all times. Show  it to your health care provider at every visit and before starting a new medicine. Keep the list up to date.  Ask your health care provider or pharmacist to help you write a plan or schedule so that you know things about each medicine such as:  Why you are taking it.  The possible side effects.  The best time of day to take it.  Foods to take with it or avoid.  When to stop taking it.  Record your hospital or clinic  weight. When you get home, compare it to your scale and record your weight. Then, weigh yourself first thing in the morning daily, and record the weights. You should weigh yourself every morning after you urinate and before you eat breakfast. Wear the same amount of clothing each time you weigh yourself. Provide your health care provider with your weight record. Daily weights are important in the early recognition of excess fluid. Tell your health care provider right away if you have gained 03 lb/1.4 kg in 1 day, 05 lb/2.3 kg in a week, or as directed by your health care provider. Your medicines may need to be adjusted.  Blood pressure monitoring should be done as often as directed. You can get a home blood pressure cuff at your drugstore. Record these values and bring them with you for your clinic visits. Notify your health care provider if you become dizzy or light-headed when standing up.  If you are currently a smoker, it is time to quit. Nicotine makes your heart work harder and is one of the leading causes of cardiac deaths. Do not use nicotine gum or patches before talking to your doctor.  Make a follow-up appointment with your health care provider as directed.  Ask your health care provider for a copy of your latest heart tracing (ECG) and keep a copy with you at all times. SEEK IMMEDIATE MEDICAL CARE IF:   You have severe chest pain, especially if the pain is crushing or pressure-like and spreads to the arms, back, neck, or jaw. THIS IS AN EMERGENCY. Do not wait to see if the pain will go away. Call for local emergency medical help. Do not drive yourself to the hospital.  You have sweating, feel sick to your stomach (nauseous), or are experiencing shortness of breath.  Your weight increases by 03 lb/1.4 kg in 1 day or 05 lb/2.3 kg in a week.  You notice increasing shortness of breath that is unusual for you. This may happen during rest, sleep, or with activity.  You develop chest pain  (angina) or pain that is unusual for you.  You notice more swelling in your hands, feet, ankles, or abdomen.  You notice lasting (persistent) dizziness, blurred vision, headache, or unsteadiness.  You begin to cough up bloody mucus (sputum).  You are unable to sleep because it is hard to breathe.  You begin to feel a "jumping" or "fluttering" sensation (palpitations) in the chest that is unusual for you. MAKE SURE YOU:  Understand these instructions.  Will watch your condition.  Will get help right away if you are not doing well or get worse. Document Released: 12/02/2002 Document Revised: 09/16/2013 Document Reviewed: 05/19/2013 [facility] Patient Information 2015 [place], [place]. This information is not intended to replace advice given to you by your health care provider. Make sure you discuss any questions you have with your health care provider.

## 2015-01-21 NOTE — Telephone Encounter (Signed)
Would like a call back about pt to discuss goals and restrictions necessary.

## 2015-01-21 NOTE — ED Notes (Signed)
Pt states cough with shortness of breath x 2 days.  States increases in lying position.  No fever.  Cough is productive.

## 2015-01-21 NOTE — ED Provider Notes (Signed)
CSN: [id number]     Arrival date & time 01/21/15  1635 History   First MD Initiated Contact with Patient 01/21/15 1851     Chief Complaint  Patient presents with  . Shortness of Breath  . Cough     (Consider location/radiation/quality/duration/timing/severity/associated sxs/prior Treatment) HPI  This a 50 year old female with a history of headache, hypertension, morbid obesity who presents with shortness of breath. Patient reports 24 hours of worsening shortness of breath. She describes orthopnea and increased shortness of breath upon lying flat. Also reports dyspnea on exertion. Denies any chest pain. She reports a cough productive of some "phlegm."  She denies any fever. Does report increased lower extremity swelling. Denies any fevers, abdominal pain, nausea, vomiting. No known history of blood clots.  Past Medical History  Diagnosis Date  . History of chicken pox   . Headache(784.0)   . Hypertension   . Osteoarthritis   . Morbid obesity    Past Surgical History  Procedure Laterality Date  . Tubal ligation  1985   Family History  Problem Relation Age of Onset  . Arthritis Mother   . Prostate cancer Maternal Grandfather   . Stroke    . Hypertension Mother   . Breast cancer Maternal Grandfather    History  Substance Use Topics  . Smoking status: Never Smoker   . Smokeless tobacco: Never Used  . Alcohol Use: No   OB History    No data available     Review of Systems  Constitutional: Negative for fever.  Respiratory: Positive for cough and shortness of breath. Negative for chest tightness.   Cardiovascular: Positive for leg swelling. Negative for chest pain.  Gastrointestinal: Negative for nausea, vomiting and abdominal pain.  Genitourinary: Negative for dysuria.  Musculoskeletal: Negative for back pain.  Neurological: Negative for headaches.  Psychiatric/Behavioral: Negative for confusion.  All other systems reviewed and are negative.     Allergies  Review  of patient's allergies indicates no known allergies.  Home Medications   Prior to Admission medications   Medication Sig Start Date End Date Taking? Authorizing Provider  ALPRAZolam (XANAX XR) 0.5 MG 24 hr tablet Take 1 tablet (0.5 mg total) by mouth 2 (two) times daily as needed. 09/22/14  Yes Doe-Hyun Sherran Needs, DO  Amlodipine-Valsartan-HCTZ 40-981-19 MG TABS Take 1 tablet by mouth daily. 12/21/14  Yes Doe-Hyun R Artist Pais, DO  atorvastatin (LIPITOR) 20 MG tablet Take 1 tablet (20 mg total) by mouth daily. 12/24/14  Yes Doe-Hyun R Artist Pais, DO  guaiFENesin (MUCINEX) 600 MG 12 hr tablet Take 600 mg by mouth 2 (two) times daily.   Yes Historical Provider, MD  hydrALAZINE (APRESOLINE) 25 MG tablet Take 1 tablet (25 mg total) by mouth 3 (three) times daily. 12/21/14  Yes Doe-Hyun R Artist Pais, DO  Nebivolol HCl 20 MG TABS Take 2 tablets (40 mg total) by mouth daily. 12/21/14  Yes Doe-Hyun R Artist Pais, DO  OVER THE COUNTER MEDICATION One off medication for sinus congestion   Yes Historical Provider, MD  oxycodone (ROXICODONE) 30 MG immediate release tablet Take 1 tablet (30 mg total) by mouth every 8 (eight) hours as needed for pain. 12/21/14  Yes Doe-Hyun R Artist Pais, DO  spironolactone (ALDACTONE) 25 MG tablet Take 1 tablet (25 mg total) by mouth daily. 12/21/14  Yes Doe-Hyun R Artist Pais, DO  COLCRYS 0.6 MG tablet Take 1 tablet by mouth daily as needed. 06/20/13   Historical Provider, MD  furosemide (LASIX) 40 MG tablet Take 1 tablet (40 mg  total) by mouth 2 (two) times daily. 01/21/15   Shon Baton, MD  furosemide (LASIX) 40 MG tablet Take 1.5 tablets (60 mg total) by mouth daily. 01/21/15   Shon Baton, MD  meloxicam (MOBIC) 15 MG tablet Take 1 tablet by mouth daily. 11/03/14   Historical Provider, MD   BP 148/93 mmHg  Pulse 100  Temp(Src) 99.4 F (37.4 C) (Oral)  Resp 21  SpO2 96%  LMP 01/21/2015 Physical Exam  Constitutional: She is oriented to person, place, and time. She appears well-developed and well-nourished.   Morbidly obese, no acute distress  HENT:  Head: Normocephalic and atraumatic.  Cardiovascular: Regular rhythm and normal heart sounds.   Tachycardia  Pulmonary/Chest: Effort normal. No respiratory distress. She has wheezes.  Fair movement, diffuse wheezing  Abdominal: Soft. Bowel sounds are normal. There is no tenderness. There is no rebound.  Musculoskeletal: She exhibits edema.  Symmetric 2+ bilateral lower extremity edema  Neurological: She is alert and oriented to person, place, and time.  Skin: Skin is warm and dry.  Psychiatric: She has a normal mood and affect.  Nursing note and vitals reviewed.   ED Course  Procedures (including critical care time) Labs Review Labs Reviewed  CBC WITH DIFFERENTIAL/PLATELET - Abnormal; Notable for the following:    RBC 3.77 (*)    Hemoglobin 11.2 (*)    HCT 34.4 (*)    All other components within normal limits  BASIC METABOLIC PANEL - Abnormal; Notable for the following:    GFR calc non Af Amer 79 (*)    All other components within normal limits  BRAIN NATRIURETIC PEPTIDE  TROPONIN I    Imaging Review Dg Chest 2 View  01/21/2015   CLINICAL DATA:  Productive cough with shortness of breath and chest tightness for 2 days. History of hypertension. Initial encounter.  EXAM: CHEST  2 VIEW  COMPARISON:  01/03/2013.  FINDINGS: The heart size is stable at the upper limits of normal. The mediastinal contours are stable. There is vascular congestion without overt pulmonary edema. There is no confluent airspace opacity or pleural effusion. Paraspinal osteophytes are noted throughout the thoracic spine.  IMPRESSION: Borderline heart size with pulmonary venous congestion. No evidence of pneumonia.   Electronically Signed   By: Carey Bullocks M.D.   On: 01/21/2015 19:01     EKG Interpretation   Date/Time:  Thursday January 21 2015 20:02:48 EDT Ventricular Rate:  104 PR Interval:  143 QRS Duration: 89 QT Interval:  340 QTC Calculation: 447 R  Axis:   15 Text Interpretation:  Sinus tachycardia Consider right atrial enlargement  Minimal ST depression, inferior leads Confirmed by Nyal Schachter  MD, Taniela Feltus  ([id number]) on 01/21/2015 9:00:16 PM      MDM   Final diagnoses:  SOB (shortness of breath)  Pulmonary vascular congestion    Patient presents with shortness of breath. Wheezing on exam with lower extremity edema. History most suggestive of pulmonary edema. No history of CHF. EKG reassuring. Patient is noted to be tachycardic. Chest x-ray shows evidence of pulmonary vascular congestion. BNP is normal and troponin is unremarkable. While PE is a consideration, given orthopnea, edema, and wheezing on exam would favor pulmonary venous congestion as the cause of the patient's shortness of breath. Patient given Lasix. She ambulated and was able to maintain her pulse ox. Patient reports that she only takes 40 mg of Lasix once daily. Will increase to 40 mg twice a day for the next 3 days. She is to  follow-up with her primary physician. If she has any new or worsening symptoms she is to be reevaluated immediately.  After history, exam, and medical workup I feel the patient has been appropriately medically screened and is safe for discharge home. Pertinent diagnoses were discussed with the patient. Patient was given return precautions.     Shon Batonourtney F Loriana Samad, MD 01/21/15 [phone]

## 2015-01-22 NOTE — Telephone Encounter (Signed)
Please clarify message.  What is patient requesting?

## 2015-01-22 NOTE — Telephone Encounter (Signed)
Left message for pt to call back  °

## 2015-01-22 NOTE — Telephone Encounter (Signed)
Please advise 

## 2015-01-22 NOTE — Telephone Encounter (Signed)
Left message for Theodosia BlenderKotcharian to call me back

## 2015-02-01 NOTE — Telephone Encounter (Signed)
Called pt to find out the dates she was out of work.  She stated that she stopped working on 10/26/14 and has not returned yet.  I faxed forms back to [employer] with date range 11/10/14-undetermined

## 2015-02-13 ENCOUNTER — Encounter (HOSPITAL_COMMUNITY): Payer: Self-pay

## 2015-02-13 ENCOUNTER — Emergency Department (HOSPITAL_COMMUNITY)
Admission: EM | Admit: 2015-02-13 | Discharge: 2015-02-13 | Disposition: A | Discharge status: Home / Self Care | Payer: 59 | Attending: Emergency Medicine | Admitting: Emergency Medicine

## 2015-02-13 ENCOUNTER — Emergency Department (HOSPITAL_COMMUNITY): Payer: 59

## 2015-02-13 DIAGNOSIS — I509 Heart failure, unspecified: Secondary | ICD-10-CM | POA: Insufficient documentation

## 2015-02-13 DIAGNOSIS — Z8739 Personal history of other diseases of the musculoskeletal system and connective tissue: Secondary | ICD-10-CM | POA: Insufficient documentation

## 2015-02-13 DIAGNOSIS — J209 Acute bronchitis, unspecified: Secondary | ICD-10-CM | POA: Diagnosis not present

## 2015-02-13 DIAGNOSIS — Z3202 Encounter for pregnancy test, result negative: Secondary | ICD-10-CM | POA: Insufficient documentation

## 2015-02-13 DIAGNOSIS — I10 Essential (primary) hypertension: Secondary | ICD-10-CM | POA: Diagnosis not present

## 2015-02-13 DIAGNOSIS — R0602 Shortness of breath: Secondary | ICD-10-CM | POA: Diagnosis present

## 2015-02-13 DIAGNOSIS — Z8619 Personal history of other infectious and parasitic diseases: Secondary | ICD-10-CM | POA: Insufficient documentation

## 2015-02-13 DIAGNOSIS — Z79899 Other long term (current) drug therapy: Secondary | ICD-10-CM | POA: Insufficient documentation

## 2015-02-13 LAB — COMPREHENSIVE METABOLIC PANEL
ALT: 13 U/L — ABNORMAL LOW (ref 14–54)
AST: 20 U/L (ref 15–41)
Albumin: 3.2 g/dL — ABNORMAL LOW (ref 3.5–5.0)
Alkaline Phosphatase: 94 U/L (ref 38–126)
Anion gap: 8 (ref 5–15)
BUN: 14 mg/dL (ref 6–20)
CO2: 27 mmol/L (ref 22–32)
Calcium: 9 mg/dL (ref 8.9–10.3)
Chloride: 100 mmol/L — ABNORMAL LOW (ref 101–111)
Creatinine, Ser: 0.98 mg/dL (ref 0.44–1.00)
GFR calc Af Amer: >60 mL/min (ref >60)
GFR calc non Af Amer: >60 mL/min (ref >60)
Glucose, Bld: 102 mg/dL — ABNORMAL HIGH (ref 65–99)
Potassium: 3.4 mmol/L — ABNORMAL LOW (ref 3.5–5.1)
Sodium: 135 mmol/L (ref 135–145)
Total Bilirubin: 0.3 mg/dL (ref 0.3–1.2)
Total Protein: 7.1 g/dL (ref 6.5–8.1)

## 2015-02-13 LAB — CBC WITH DIFFERENTIAL/PLATELET
Basophils Absolute: 0 10*3/uL (ref 0.0–0.1)
Basophils Relative: 1 % (ref 0–1)
EOS ABS: 0 10*3/uL (ref 0.0–0.7)
EOS PCT: 1 % (ref 0–5)
HCT: 35.6 % — ABNORMAL LOW (ref 36.0–46.0)
Hemoglobin: 11.6 g/dL — ABNORMAL LOW (ref 12.0–15.0)
LYMPHS PCT: 33 % (ref 12–46)
Lymphs Abs: 2.1 10*3/uL (ref 0.7–4.0)
MCH: 29.4 pg (ref 26.0–34.0)
MCHC: 32.6 g/dL (ref 30.0–36.0)
MCV: 90.4 fL (ref 78.0–100.0)
MONOS PCT: 9 % (ref 3–12)
Monocytes Absolute: 0.6 10*3/uL (ref 0.1–1.0)
NEUTROS ABS: 3.6 10*3/uL (ref 1.7–7.7)
Neutrophils Relative %: 56 % (ref 43–77)
Platelets: 242 10*3/uL (ref 150–400)
RBC: 3.94 MIL/uL (ref 3.87–5.11)
RDW: 13.8 % (ref 11.5–15.5)
WBC: 6.4 10*3/uL (ref 4.0–10.5)

## 2015-02-13 LAB — D-DIMER, QUANTITATIVE: D-Dimer, Quant: 0.6 ug/mL-FEU — ABNORMAL HIGH (ref 0.00–0.48)

## 2015-02-13 LAB — TROPONIN I: Troponin I: <0.03 ng/mL (ref <0.031)

## 2015-02-13 LAB — I-STAT BETA HCG BLOOD, ED (MC, WL, AP ONLY): I-stat hCG, quantitative: <5 m[IU]/mL (ref <5)

## 2015-02-13 LAB — BRAIN NATRIURETIC PEPTIDE: B Natriuretic Peptide: 20.3 pg/mL (ref 0.0–100.0)

## 2015-02-13 MED ORDER — IPRATROPIUM-ALBUTEROL 0.5-2.5 (3) MG/3ML IN SOLN
3.0000 mL | Freq: Once | RESPIRATORY_TRACT | Status: AC
  Administered 2015-02-13: 3 mL via RESPIRATORY_TRACT
  Filled 2015-02-13: qty 3

## 2015-02-13 MED ORDER — AZITHROMYCIN 250 MG PO TABS
500.0000 mg | ORAL_TABLET | Freq: Once | ORAL | Status: AC
  Administered 2015-02-13: 500 mg via ORAL
  Filled 2015-02-13: qty 2

## 2015-02-13 MED ORDER — IOHEXOL 350 MG/ML SOLN
80.0000 mL | Freq: Once | INTRAVENOUS | Status: AC | PRN
  Administered 2015-02-13: 80 mL via INTRAVENOUS

## 2015-02-13 MED ORDER — GUAIFENESIN ER 600 MG PO TB12: 600.0000 mg | ORAL_TABLET | Freq: Two times a day (BID) | ORAL | Status: DC

## 2015-02-13 MED ORDER — DEXAMETHASONE 4 MG PO TABS
10.0000 mg | ORAL_TABLET | Freq: Once | ORAL | Status: AC
  Administered 2015-02-13: 10 mg via ORAL
  Filled 2015-02-13: qty 3

## 2015-02-13 MED ORDER — AZITHROMYCIN 250 MG PO TABS: 250.0000 mg | ORAL_TABLET | Freq: Every day | ORAL | Status: DC

## 2015-02-13 NOTE — ED Notes (Signed)
Pt complaining of shortness of breath at rest. Cannot lay flat. Pt has hx of CHF, currently takes lasix. Some anxiety, NAD. Vitals stable.

## 2015-02-13 NOTE — ED Provider Notes (Signed)
CSN: [id number]     Arrival date & time 02/13/15  0215 History   None    This chart was scribed for Marisa Severinlga Joanell Cressler, MD by Arlan OrganAshley Leger, ED Scribe. This patient was seen in room A02C/A02C and the patient's care was started 2:54 AM.   Chief Complaint  Patient presents with  . Shortness of Breath   The history is provided by the patient. No language interpreter was used.    HPI Comments: [name] is a 49 y.o. female with a PMHx HTN and CHF who presents to the Emergency Department complaining of intermittent, ongoing, unchanged shortness of breath this evening while at rest. Pt [name] symptoms are exacerbated when lying flat and alleviated when sitting up. She also reports intermittent, ongoing chest pain that has been ongoing for some time now. No recent leg swelling or abdominal distention. No fever or chills at this time. Pt was recently seen for same on 5/28. Discharge plan included increasing Lasix 40 mg medication to twice daily x 3 days. Prior to ED visit, pt [name] she was advised to take 60 mg of Lasix daily since last visit with PCP. Pt was advised to follow up with PCP following emergency department visit, appointment scheduled for this coming week. [name] she has been evaluated by cardiology in the past with stress test study performed in 2015. No known allergies to medications.  Past Medical History  Diagnosis Date  . History of chicken pox   . Headache(784.0)   . Hypertension   . Osteoarthritis   . Morbid obesity    Past Surgical History  Procedure Laterality Date  . Tubal ligation  1985   Family History  Problem Relation Age of Onset  . Arthritis Mother   . Prostate cancer Maternal Grandfather   . Stroke    . Hypertension Mother   . Breast cancer Maternal Grandfather    History  Substance Use Topics  . Smoking status: Never Smoker   . Smokeless tobacco: Never Used  . Alcohol Use: No   OB History    No data available     Review of Systems   Constitutional: Negative for fever and chills.  Respiratory: Positive for shortness of breath. Negative for cough.   Cardiovascular: Positive for chest pain. Negative for leg swelling.  Gastrointestinal: Negative for nausea, vomiting and diarrhea.  Skin: Negative for rash.  Psychiatric/Behavioral: Negative for confusion.      Allergies  Review of patient's allergies indicates no known allergies.  Home Medications   Prior to Admission medications   Medication Sig Start Date End Date Taking? Authorizing Provider  ALPRAZolam (XANAX XR) 0.5 MG 24 hr tablet Take 1 tablet (0.5 mg total) by mouth 2 (two) times daily as needed. Patient taking differently: Take 0.5 mg by mouth 2 (two) times daily as needed for anxiety.  09/22/14   Doe-Hyun Sherran Needs Yoo, DO  Amlodipine-Valsartan-HCTZ [id number] MG TABS Take 1 tablet by mouth daily. 12/21/14   Doe-Hyun R Artist PaisYoo, DO  atorvastatin (LIPITOR) 20 MG tablet Take 1 tablet (20 mg total) by mouth daily. 12/24/14   Doe-Hyun R Artist PaisYoo, DO  furosemide (LASIX) 40 MG tablet Take 1 tablet (40 mg total) by mouth 2 (two) times daily. 01/21/15   Shon Batonourtney F Horton, MD  furosemide (LASIX) 40 MG tablet Take 1.5 tablets (60 mg total) by mouth daily. 01/21/15   Shon Batonourtney F Horton, MD  guaiFENesin (MUCINEX) 600 MG 12 hr tablet Take 600 mg by mouth 2 (two) times daily.  Historical Provider, MD  hydrALAZINE (APRESOLINE) 25 MG tablet Take 1 tablet (25 mg total) by mouth 3 (three) times daily. 12/21/14   Doe-Hyun R Artist Pais, DO  Nebivolol HCl 20 MG TABS Take 2 tablets (40 mg total) by mouth daily. 12/21/14   Doe-Hyun R Artist Pais, DO  OVER THE COUNTER MEDICATION One off medication for sinus congestion    Historical Provider, MD  oxycodone (ROXICODONE) 30 MG immediate release tablet Take 1 tablet (30 mg total) by mouth every 8 (eight) hours as needed for pain. 12/21/14   Doe-Hyun R Artist Pais, DO  spironolactone (ALDACTONE) 25 MG tablet Take 1 tablet (25 mg total) by mouth daily. 12/21/14   Doe-Hyun Sherran Needs, DO    Triage Vitals: BP 146/72 mmHg  Resp 30  LMP 01/21/2015   Physical Exam  Constitutional: She is oriented to person, place, and time. She appears well-developed and well-nourished.  HENT:  Head: Normocephalic and atraumatic.  Nose: Nose normal.  Mouth/Throat: Oropharynx is clear and moist.  Eyes: Conjunctivae and EOM are normal. Pupils are equal, round, and reactive to light.  Neck: Normal range of motion. Neck supple. No JVD present. No tracheal deviation present. No thyromegaly present.  Cardiovascular: Normal rate, regular rhythm, normal heart sounds and intact distal pulses.  Exam reveals no gallop and no friction rub.   No murmur heard. Pulmonary/Chest: Effort normal and breath sounds normal. No stridor. No respiratory distress. She has no wheezes. She has no rales. She exhibits no tenderness.  Abdominal: Soft. Bowel sounds are normal. She exhibits no distension and no mass. There is no tenderness. There is no rebound and no guarding.  Musculoskeletal: Normal range of motion. She exhibits no edema or tenderness.  Lymphadenopathy:    She has no cervical adenopathy.  Neurological: She is alert and oriented to person, place, and time. She displays normal reflexes. She exhibits normal muscle tone. Coordination normal.  Skin: Skin is warm and dry. No rash noted. No erythema. No pallor.  Psychiatric: She has a normal mood and affect. Her behavior is normal. Judgment and thought content normal.  Nursing note and vitals reviewed.   ED Course  Procedures (including critical care time)  DIAGNOSTIC STUDIES: Oxygen Saturation is 94% on room air, normal by my interpretation.    COORDINATION OF CARE: 3:04 AM- Will order CXR, CMP, Troponin I, BNP, and CBC. Discussed treatment plan with pt at bedside and pt agreed to plan.     Labs Review Labs Reviewed  COMPREHENSIVE METABOLIC PANEL - Abnormal; Notable for the following:    Potassium 3.4 (*)    Chloride 100 (*)    Glucose, Bld 102 (*)     Albumin 3.2 (*)    ALT 13 (*)    All other components within normal limits  CBC WITH DIFFERENTIAL/PLATELET - Abnormal; Notable for the following:    Hemoglobin 11.6 (*)    HCT 35.6 (*)    All other components within normal limits  TROPONIN I  BRAIN NATRIURETIC PEPTIDE    Imaging Review Dg Chest 2 View  02/13/2015   CLINICAL DATA:  Shortness of breath at rest, history CHF.  EXAM: CHEST  2 VIEW  COMPARISON:  Chest radiograph January 21, 2015  FINDINGS: Cardiac silhouette is upper limits of normal in size, mediastinal silhouette is unremarkable. Diffuse mild interstitial prominence. The lungs are otherwise clear without pleural effusions or focal consolidations. Trachea projects midline and there is no pneumothorax. Soft tissue planes and included osseous structures are non-suspicious. Moderate degenerative change  of the thoracic spine.  IMPRESSION: Diffuse mild interstitial prominence suggests atypical infection, less likely interstitial edema without focal consolidation.  Borderline cardiomegaly.   Electronically Signed   By: Awilda Metro   On: 02/13/2015 03:36     EKG Interpretation   Date/Time:  Saturday Feb 13 2015 02:24:12 EDT Ventricular Rate:  102 PR Interval:  141 QRS Duration: 89 QT Interval:  356 QTC Calculation: 464 R Axis:   20 Text Interpretation:  Sinus tachycardia Atrial premature complexes No  significant change since last tracing Confirmed by Lisa-Marie Rueger  MD, Lyndol Vanderheiden ([id number])  on 02/13/2015 2:48:52 AM      MDM   Final diagnoses:  Bronchitis, subacute    48 year old female with worsening dyspnea on exertion, orthopnea.  Patient seen for same on the 28th, increase Lasix and resolved.  Patient reports symptoms have worsened over the last week.  She has follow-up with her primary care doctor on Wednesday.  I personally performed the services described in this documentation, which was scribed in my presence. The recorded information has been reviewed and is  accurate.    Marisa Severin, MD 02/13/15 [phone]

## 2015-02-13 NOTE — Discharge Instructions (Signed)
Take medications as prescribed.  Follow up as scheduled on Wednesday.  NO signs of blood clot or CHF were noted today on your workup.   Cool Mist Vaporizers Vaporizers may help relieve the symptoms of a cough and cold. They add moisture to the air, which helps mucus to become thinner and less sticky. This makes it easier to breathe and cough up secretions. Cool mist vaporizers do not cause serious burns like hot mist vaporizers, which may also be called steamers or humidifiers. Vaporizers have not been proven to help with colds. You should not use a vaporizer if you are allergic to mold. HOME CARE INSTRUCTIONS  Follow the package instructions for the vaporizer.  Do not use anything other than distilled water in the vaporizer.  Do not run the vaporizer all of the time. This can cause mold or bacteria to grow in the vaporizer.  Clean the vaporizer after each time it is used.  Clean and dry the vaporizer well before storing it.  Stop using the vaporizer if worsening respiratory symptoms develop. Document Released: 06/08/2004 Document Revised: 09/16/2013 Document Reviewed: 01/29/2013 [facility] Patient Information 2015 [place], [place]. This information is not intended to replace advice given to you by your health care provider. Make sure you discuss any questions you have with your health care provider.  Acute Bronchitis Bronchitis is inflammation of the airways that extend from the windpipe into the lungs (bronchi). The inflammation often causes mucus to develop. This leads to a cough, which is the most common symptom of bronchitis.  In acute bronchitis, the condition usually develops suddenly and goes away over time, usually in a couple weeks. Smoking, allergies, and asthma can make bronchitis worse. Repeated episodes of bronchitis may cause further lung problems.  CAUSES Acute bronchitis is most often caused by the same virus that causes a cold. The virus can spread from person to person  (contagious) through coughing, sneezing, and touching contaminated objects. SIGNS AND SYMPTOMS   Cough.   Fever.   Coughing up mucus.   Body aches.   Chest congestion.   Chills.   Shortness of breath.   Sore throat.  DIAGNOSIS  Acute bronchitis is usually diagnosed through a physical exam. Your health care provider will also ask you questions about your medical history. Tests, such as chest X-rays, are sometimes done to rule out other conditions.  TREATMENT  Acute bronchitis usually goes away in a couple weeks. Oftentimes, no medical treatment is necessary. Medicines are sometimes given for relief of fever or cough. Antibiotic medicines are usually not needed but may be prescribed in certain situations. In some cases, an inhaler may be recommended to help reduce shortness of breath and control the cough. A cool mist vaporizer may also be used to help thin bronchial secretions and make it easier to clear the chest.  HOME CARE INSTRUCTIONS  Get plenty of rest.   Drink enough fluids to keep your urine clear or pale yellow (unless you have a medical condition that requires fluid restriction). Increasing fluids may help thin your respiratory secretions (sputum) and reduce chest congestion, and it will prevent dehydration.   Take medicines only as directed by your health care provider.  If you were prescribed an antibiotic medicine, finish it all even if you start to feel better.  Avoid smoking and secondhand smoke. Exposure to cigarette smoke or irritating chemicals will make bronchitis worse. If you are a smoker, consider using nicotine gum or skin patches to help control withdrawal symptoms. Quitting smoking  will help your lungs heal faster.   Reduce the chances of another bout of acute bronchitis by washing your hands frequently, avoiding people with cold symptoms, and trying not to touch your hands to your mouth, nose, or eyes.   Keep all follow-up visits as directed by  your health care provider.  SEEK MEDICAL CARE IF: Your symptoms do not improve after 1 week of treatment.  SEEK IMMEDIATE MEDICAL CARE IF:  You develop an increased fever or chills.   You have chest pain.   You have severe shortness of breath.  You have bloody sputum.   You develop dehydration.  You faint or repeatedly feel like you are going to pass out.  You develop repeated vomiting.  You develop a severe headache. MAKE SURE YOU:   Understand these instructions.  Will watch your condition.  Will get help right away if you are not doing well or get worse. Document Released: 10/19/2004 Document Revised: 01/26/2014 Document Reviewed: 03/04/2013 [facility] Patient Information 2015 [place]. This information is not intended to replace advice given to you by your health care provider. Make sure you discuss any questions you have with your health care provider.

## 2015-02-17 ENCOUNTER — Ambulatory Visit (INDEPENDENT_AMBULATORY_CARE_PROVIDER_SITE_OTHER): Payer: 59 | Admitting: Family Medicine

## 2015-02-17 ENCOUNTER — Encounter: Payer: Self-pay | Admitting: Family Medicine

## 2015-02-17 VITALS — BP 172/104 | Temp 98.3°F | Ht 69.0 in | Wt 334.0 lb

## 2015-02-17 DIAGNOSIS — I5032 Chronic diastolic (congestive) heart failure: Secondary | ICD-10-CM

## 2015-02-17 DIAGNOSIS — I10 Essential (primary) hypertension: Secondary | ICD-10-CM

## 2015-02-17 DIAGNOSIS — R0602 Shortness of breath: Secondary | ICD-10-CM

## 2015-02-17 DIAGNOSIS — M545 Low back pain, unspecified: Secondary | ICD-10-CM

## 2015-02-17 DIAGNOSIS — G8929 Other chronic pain: Secondary | ICD-10-CM

## 2015-02-17 MED ORDER — OXYCODONE HCL 30 MG PO TABS
30.0000 mg | ORAL_TABLET | Freq: Three times a day (TID) | ORAL | Status: DC | PRN
Start: 2015-02-17 — End: 2015-03-16

## 2015-02-17 MED ORDER — CARVEDILOL 25 MG PO TABS: 25.0000 mg | ORAL_TABLET | Freq: Two times a day (BID) | ORAL | Status: DC

## 2015-02-17 NOTE — Progress Notes (Signed)
   Subjective:    Patient ID: [name], female    DOB: October 05, 1965, 49 y.o.   MRN: [id number]  HPI 49 yr old female here to follow up on HTN and on chronic SOB. She has been to the ED twice recently for severe SOB, once on 01-21-15 and again on 02-13-15. She has had a CXR which originally suggested some pulmonary edema, however a recent CT angiogram showed normal lungs with no edema. This scan also ruled out any PE's. Her labs have been normal including her renal function. Her BNP has been normal. She has been treated with multiple kinds of diuretics, with mixed results. She is on Bystolic, Amlodipine-Valsartan-HCT, Lasix, Spironolactone, and Hydralazine. She checks her BP at home and she averages around 160s over 90s.    Review of Systems  Constitutional: Negative.   Respiratory: Positive for shortness of breath. Negative for cough, choking, chest tightness and wheezing.   Cardiovascular: Negative.   Gastrointestinal: Negative.   Neurological: Negative.        Objective:   Physical Exam  Constitutional:  Morbidly obese   Neck: No thyromegaly present.  Cardiovascular: Normal rate, regular rhythm, normal heart sounds and intact distal pulses.   Pulmonary/Chest: Effort normal and breath sounds normal.  Lymphadenopathy:    She has no cervical adenopathy.          Assessment & Plan:  Her SOB seems to be due to a combination of morbid obesity and out of control HTN. We will stop the Nebivolol and switch to Carvedilol 25 mg bid. She is trying to lose some weight. Recheck in one month

## 2015-02-17 NOTE — Progress Notes (Signed)
Pre visit review using our clinic review tool, if applicable. No additional management support is needed unless otherwise documented below in the visit note. 

## 2015-02-19 ENCOUNTER — Ambulatory Visit: Payer: 59 | Admitting: Internal Medicine

## 2015-03-15 ENCOUNTER — Telehealth: Payer: Self-pay | Admitting: Internal Medicine

## 2015-03-15 DIAGNOSIS — G8929 Other chronic pain: Secondary | ICD-10-CM

## 2015-03-15 DIAGNOSIS — M545 Low back pain: Principal | ICD-10-CM

## 2015-03-15 NOTE — Telephone Encounter (Signed)
° ° ° °  Pt request refill of the following: ° ° °oxycodone (ROXICODONE) 30 MG immediate release tablet ° ° °Phamacy: °

## 2015-03-15 NOTE — Telephone Encounter (Signed)
Will you sign for this?

## 2015-03-15 NOTE — Telephone Encounter (Signed)
ok 

## 2015-03-16 MED ORDER — OXYCODONE HCL 30 MG PO TABS: 30.0000 mg | ORAL_TABLET | Freq: Three times a day (TID) | ORAL | Status: DC | PRN

## 2015-03-16 NOTE — Telephone Encounter (Signed)
rx up front for p/u pt aware

## 2015-04-14 ENCOUNTER — Telehealth: Payer: Self-pay | Admitting: Internal Medicine

## 2015-04-14 DIAGNOSIS — M545 Low back pain, unspecified: Secondary | ICD-10-CM

## 2015-04-14 DIAGNOSIS — G8929 Other chronic pain: Secondary | ICD-10-CM

## 2015-04-14 MED ORDER — OXYCODONE HCL 30 MG PO TABS: 30.0000 mg | ORAL_TABLET | Freq: Three times a day (TID) | ORAL | Status: DC | PRN

## 2015-04-14 NOTE — Telephone Encounter (Signed)
Spoke to pt, told her Rx ready for pickup and Dr. Artist PaisYoo said need office visit for further refills. Pt verbalized understanding. Rx printed and signed.

## 2015-04-14 NOTE — Telephone Encounter (Signed)
RF x 1.  Needs OV for additional refills

## 2015-04-14 NOTE — Telephone Encounter (Signed)
° ° ° °  Pt request refill of the following: ° ° °oxycodone (ROXICODONE) 30 MG immediate release tablet ° ° °Phamacy: °

## 2015-05-13 ENCOUNTER — Telehealth: Payer: Self-pay | Admitting: Internal Medicine

## 2015-05-13 DIAGNOSIS — M545 Low back pain, unspecified: Secondary | ICD-10-CM

## 2015-05-13 DIAGNOSIS — G8929 Other chronic pain: Secondary | ICD-10-CM

## 2015-05-13 NOTE — Telephone Encounter (Signed)
Called patient to reshedule appt that she has tomorrow with Dr. Artist Pais due to him being out.  Pt is ok with resceduling appt but states she is concerned about running out of her oxycodone (ROXICODONE) 30 MG immediate release tablet and she needs a refill immediately.  This was the reason she was coming in to see PCP.  Pt requesting refill of this medication.

## 2015-05-14 ENCOUNTER — Ambulatory Visit (INDEPENDENT_AMBULATORY_CARE_PROVIDER_SITE_OTHER): Payer: Self-pay | Admitting: Family Medicine

## 2015-05-14 ENCOUNTER — Ambulatory Visit: Payer: 59 | Admitting: Internal Medicine

## 2015-05-14 ENCOUNTER — Encounter: Payer: Self-pay | Admitting: Family Medicine

## 2015-05-14 VITALS — BP 138/88 | HR 57 | Temp 98.1°F | Ht 69.0 in | Wt 344.4 lb

## 2015-05-14 DIAGNOSIS — M170 Bilateral primary osteoarthritis of knee: Secondary | ICD-10-CM

## 2015-05-14 DIAGNOSIS — M545 Low back pain, unspecified: Secondary | ICD-10-CM

## 2015-05-14 DIAGNOSIS — R300 Dysuria: Secondary | ICD-10-CM

## 2015-05-14 DIAGNOSIS — G8929 Other chronic pain: Secondary | ICD-10-CM

## 2015-05-14 DIAGNOSIS — I10 Essential (primary) hypertension: Secondary | ICD-10-CM

## 2015-05-14 LAB — POCT URINALYSIS DIPSTICK
Bilirubin, UA: NEGATIVE
Glucose, UA: NEGATIVE
Ketones, UA: NEGATIVE
Nitrite, UA: POSITIVE
PH UA: 6
PROTEIN UA: 15
UROBILINOGEN UA: 0.2

## 2015-05-14 MED ORDER — OXYCODONE HCL 30 MG PO TABS: 30.0000 mg | ORAL_TABLET | Freq: Three times a day (TID) | ORAL | Status: DC | PRN

## 2015-05-14 MED ORDER — NITROFURANTOIN MONOHYD MACRO 100 MG PO CAPS: 100.0000 mg | ORAL_CAPSULE | Freq: Two times a day (BID) | ORAL | Status: DC

## 2015-05-14 NOTE — Progress Notes (Signed)
Pre visit review using our clinic review tool, if applicable. No additional management support is needed unless otherwise documented below in the visit note. 

## 2015-05-14 NOTE — Patient Instructions (Addendum)
Please schedule follow up with Dr. Artist Pais in 2-3 months  We gave you the refill on your pain medication today  Please take you blood pressure medications every day as prescribed  Take the antibiotic (nitrofurantoin) for the urinary tract infection.

## 2015-05-14 NOTE — Telephone Encounter (Signed)
rx ready for pick up and Left message on machine for patient 

## 2015-05-14 NOTE — Telephone Encounter (Signed)
Ok to RF x 1.  Can you ask Dr. Kirtland Bouchard to sign her rx

## 2015-05-14 NOTE — Addendum Note (Signed)
Addended by: Terressa Koyanagi on: 05/14/2015 02:00 PM   Modules accepted: Orders

## 2015-05-14 NOTE — Addendum Note (Signed)
Addended by: Johnella Moloney on: 05/14/2015 01:59 PM   Modules accepted: Orders

## 2015-05-14 NOTE — Progress Notes (Addendum)
HPI:  [name] is a 49 yo pt of Dr. Artist Pais with a PMH listed below here for a follow up visit:  Long standing resistant Hypertension in the setting of morbid obesity and ? poor compliance per review PCP notes: -meds: amlodipine-valsartan-hctz 10-320-25, coreg 25 bid, lasix 60mg  daily, hydralazine 25mg  tid, spironolactone 25mg  daily -reports takes medications most days per week but only takes once per day -reports her BP is good today for her -diet and exercise: trying to eat healthy; not exercising -denies: HA, SOB, DOE  HLD: -meds: atorvastatin 20mg  daily -stable  Chronic low back and knee pain: -per PCP notes on disability and followed by orthopedics -PCP has been providing oxycodone for pain -pt is very concerned she is about to run out of her oxycodone rx and PCP had to reschedule her follow up appt: appear rx already don by PCP -reports pain is helped significantly by the oxycodone 30mg  tid -denies: constipation, falls, cog issues  Dysuria: -mild for 1-2 weeks, better now but has hx of UTI and wants to check -denies: fevers, chills, malaise, flank pain, vag dicharge, pelvic pain, hematuria   ROS: See pertinent positives and negatives per HPI.  Past Medical History  Diagnosis Date  . History of chicken pox   . Headache(784.0)   . Hypertension   . Osteoarthritis   . Morbid obesity     Past Surgical History  Procedure Laterality Date  . Tubal ligation  1985    Family History  Problem Relation Age of Onset  . Arthritis Mother   . Prostate cancer Maternal Grandfather   . Stroke    . Hypertension Mother   . Breast cancer Maternal Grandfather     Social History   Social History  . Marital Status: Divorced    Spouse Name: N/A  . Number of Children: N/A  . 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 Smoking status: Never Smoker   . Smokeless tobacco: Never Used  . Alcohol Use: No  .  Drug Use: No  . Sexual Activity: Not Asked   Other Topics Concern  . 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  Amlodipine-Valsartan-HCTZ 10-320-25 MG TABS, Take 1 tablet by mouth daily., Disp: 90 tablet, Rfl: 1 .  atorvastatin (LIPITOR) 20 MG tablet, Take 1 tablet (20 mg total) by mouth daily., Disp: 30 tablet, Rfl: 2 .  carvedilol (COREG) 25 MG tablet, Take 1 tablet (25 mg total) by mouth 2 (two) times daily with a meal., Disp: 60 tablet, Rfl: 3 .  furosemide (LASIX) 40 MG tablet, Take 1.5 tablets (60 mg total) by mouth daily., Disp: 135 tablet, Rfl: 1 .  hydrALAZINE (APRESOLINE) 25 MG tablet, Take 1 tablet (25 mg total) by mouth 3 (three) times daily., Disp: 270 tablet, Rfl: 1 .  oxycodone (ROXICODONE) 30 MG immediate release tablet, Take 1 tablet (30 mg total) by mouth every 8 (eight) hours as needed for pain., Disp: 90 tablet, Rfl: 0 .  spironolactone (ALDACTONE) 25 MG tablet, Take 1 tablet (25 mg total) by mouth daily., Disp: 90 tablet, Rfl: 1  EXAM:  Filed Vitals:   05/14/15 1314  BP: 138/88  Pulse: 57  Temp: 98.1 F (36.7 C)    Body mass index is 50.84 kg/(m^2).  GENERAL: vitals reviewed and listed above, alert, oriented, appears  well hydrated and in no acute distress  HEENT: atraumatic, conjunttiva clear, no obvious abnormalities on inspection of external nose and ears  NECK: no obvious masses on inspection  LUNGS: clear to auscultation bilaterally, no wheezes, rales or rhonchi, good air movement  CV: HRRR, no peripheral edema  MS: moves all extremities without noticeable abnormality, knee braces on both knees  PSYCH: pleasant and cooperative, no obvious depression or anxiety  ASSESSMENT AND PLAN:  Discussed the following assessment and plan:  Essential hypertension  Osteoarthritis of both knees, unspecified osteoarthritis type  Morbid obesity  Chronic low back pain  -some  compliance issues with medications but BP actually better today then in the past -rx from PCP given to patient -udip c/w UTI, abx provided with return precuations -follow up with PCP in 3 months -Patient advised to return or notify a doctor immediately if symptoms worsen or persist or new concerns arise.  Patient Instructions  Please schedule follow up with Dr. Artist Pais in 2-3 months  We gave you the refill on your pain medication today  Please take you blood pressure medications every day as prescribed            KIM, HANNAH R.

## 2015-05-17 LAB — URINE CULTURE: Colony Count: >=100000

## 2015-06-09 ENCOUNTER — Telehealth: Payer: Self-pay | Admitting: Internal Medicine

## 2015-06-09 DIAGNOSIS — M545 Low back pain, unspecified: Secondary | ICD-10-CM

## 2015-06-09 DIAGNOSIS — G8929 Other chronic pain: Secondary | ICD-10-CM

## 2015-06-09 NOTE — Telephone Encounter (Signed)
Ok to RF x 2 

## 2015-06-09 NOTE — Telephone Encounter (Signed)
Pt needs new rx oxycodone. Pt needs rx before Monday. Pt is going out of town for her birthday

## 2015-06-10 MED ORDER — OXYCODONE HCL 30 MG PO TABS: 30.0000 mg | ORAL_TABLET | Freq: Three times a day (TID) | ORAL | Status: DC | PRN

## 2015-06-10 NOTE — Telephone Encounter (Signed)
Rx ready for pick up and patient is aware 

## 2015-07-07 ENCOUNTER — Telehealth: Payer: Self-pay | Admitting: Internal Medicine

## 2015-07-07 DIAGNOSIS — M545 Low back pain, unspecified: Secondary | ICD-10-CM

## 2015-07-07 DIAGNOSIS — G8929 Other chronic pain: Secondary | ICD-10-CM

## 2015-07-07 MED ORDER — OXYCODONE HCL 30 MG PO TABS: 30.0000 mg | ORAL_TABLET | Freq: Three times a day (TID) | ORAL | Status: DC | PRN

## 2015-07-07 NOTE — Telephone Encounter (Signed)
We will give her a one-month prescription of medication. She will need to go to the pain clinic for complete evaluation and further refills. Please send a note to Gavin PoundDeborah a referral coordinator to get her referred to the pain clinic

## 2015-07-07 NOTE — Telephone Encounter (Signed)
Referral placed for pain management.  Per Dr Tawanna Coolerodd patient needs to follow up with pain management for further refills.

## 2015-07-07 NOTE — Telephone Encounter (Signed)
Last filled 06/10/2015.  Please advise.

## 2015-07-07 NOTE — Telephone Encounter (Signed)
Pt request refill oxycodone (ROXICODONE) 30 MG immediate release tablet  Pt had appointment 10/17, but it had to be rescheduled due to Dr Artist PaisYoo schedule. Pt made first available 08/27/15 at 1:15.

## 2015-07-09 NOTE — Telephone Encounter (Signed)
Pt came in to pick up her Rx for pain medication she also wanted to know the status of her pain referral the referral went to [facility]  [address], [place], [place] [id number] [phone]. I called the pain clinic office while pt was in the office spoke with marie she informed me that it will take about 4-6 for referral to be reviewed ,once reviewed and approve they will contact the patient to schedule directly . I informed pt of the process of the referral and wait time .  she wanted to know if Dr Artist PaisYoo will continue to refill her pain medication  Until she can get into the pain clinic what is she suppose to do about getting refills please advise

## 2015-07-12 ENCOUNTER — Ambulatory Visit: Payer: Self-pay | Admitting: Internal Medicine

## 2015-07-12 NOTE — Telephone Encounter (Signed)
Please see if another provider can see her until she can be seen by pain mgt.  If no one willing, RF her pain meds x 1 until she can be seen

## 2015-07-12 NOTE — Telephone Encounter (Signed)
Patient should see Dr Kirtland BouchardK before any more refills.  Patient is aware.

## 2015-08-09 ENCOUNTER — Ambulatory Visit (INDEPENDENT_AMBULATORY_CARE_PROVIDER_SITE_OTHER): Payer: Self-pay | Admitting: Family Medicine

## 2015-08-09 ENCOUNTER — Encounter: Payer: Self-pay | Admitting: Family Medicine

## 2015-08-09 VITALS — BP 182/101 | HR 99 | Temp 98.7°F | Ht 69.0 in | Wt 350.0 lb

## 2015-08-09 DIAGNOSIS — M545 Low back pain, unspecified: Secondary | ICD-10-CM

## 2015-08-09 DIAGNOSIS — M170 Bilateral primary osteoarthritis of knee: Secondary | ICD-10-CM

## 2015-08-09 DIAGNOSIS — I10 Essential (primary) hypertension: Secondary | ICD-10-CM

## 2015-08-09 DIAGNOSIS — G8929 Other chronic pain: Secondary | ICD-10-CM

## 2015-08-09 MED ORDER — OXYCODONE HCL 30 MG PO TABS: 30.0000 mg | ORAL_TABLET | Freq: Three times a day (TID) | ORAL | Status: DC | PRN

## 2015-08-09 MED ORDER — HYDRALAZINE HCL 50 MG PO TABS: 50.0000 mg | ORAL_TABLET | Freq: Two times a day (BID) | ORAL | Status: DC

## 2015-08-09 NOTE — Progress Notes (Signed)
   Subjective:    Patient ID: [name], female    DOB: 05-Nov-1965, 49 y.o.   MRN: [id number]  HPI Here to follow up. She feels good except for her chronic joint pains. She describes stiffness every time she sits for a few minutes and then gets up to move around. She is taking Mobic daily in addition to Oxycodone, and she supplements these with Tylenol sometimes. Her BP remains a little high at home, often in the 150s over 90s. She has gained another 6 lbs since her last visit.    Review of Systems  Constitutional: Negative.   Respiratory: Negative.   Cardiovascular: Negative.   Musculoskeletal: Positive for arthralgias and gait problem.  Neurological: Negative.        Objective:   Physical Exam  Constitutional: She is oriented to person, place, and time.  Morbidly obese   Neck: No thyromegaly present.  Cardiovascular: Normal rate, regular rhythm, normal heart sounds and intact distal pulses.   Pulmonary/Chest: Effort normal and breath sounds normal.  Lymphadenopathy:    She has no cervical adenopathy.  Neurological: She is alert and oriented to person, place, and time.          Assessment & Plan:  Her arthritis is stable, and she is on maximum medication. Once again I encouraged her to lose weight. We will change her Hydralazine to 50 mg bid, since she usually forgets to take the midday dose. Recheck in 3 months

## 2015-08-09 NOTE — Progress Notes (Signed)
Pre visit review using our clinic review tool, if applicable. No additional management support is needed unless otherwise documented below in the visit note. 

## 2015-08-27 ENCOUNTER — Ambulatory Visit: Payer: Self-pay | Admitting: Internal Medicine

## 2015-09-09 ENCOUNTER — Telehealth: Payer: Self-pay | Admitting: Internal Medicine

## 2015-09-09 NOTE — Telephone Encounter (Signed)
Okay to fill? 

## 2015-09-09 NOTE — Telephone Encounter (Signed)
I suggest she make appt with another provider to discuss trying gabapentin and determine the appropriate dose.  I also read in her last OV note that she is taking Mobic.  I strongly suggest that we monitor her BMET.   Please set up blood test before her OV

## 2015-09-09 NOTE — Telephone Encounter (Signed)
Pt would like new rx gabapentin ?mg for sciatric pain.  [facility] [address]

## 2015-09-09 NOTE — Telephone Encounter (Signed)
Spoke with patient and she wants to go to Urgent Care.  Advised patient to [facility] to [facility].

## 2015-09-10 ENCOUNTER — Emergency Department (HOSPITAL_COMMUNITY)
Admission: EM | Admit: 2015-09-10 | Discharge: 2015-09-10 | Disposition: A | Discharge status: Home / Self Care | Payer: Self-pay | Attending: Emergency Medicine | Admitting: Emergency Medicine

## 2015-09-10 ENCOUNTER — Encounter (HOSPITAL_COMMUNITY): Payer: Self-pay

## 2015-09-10 DIAGNOSIS — Z79899 Other long term (current) drug therapy: Secondary | ICD-10-CM | POA: Insufficient documentation

## 2015-09-10 DIAGNOSIS — I10 Essential (primary) hypertension: Secondary | ICD-10-CM | POA: Insufficient documentation

## 2015-09-10 DIAGNOSIS — Z791 Long term (current) use of non-steroidal anti-inflammatories (NSAID): Secondary | ICD-10-CM | POA: Insufficient documentation

## 2015-09-10 DIAGNOSIS — M5431 Sciatica, right side: Secondary | ICD-10-CM | POA: Insufficient documentation

## 2015-09-10 DIAGNOSIS — M1990 Unspecified osteoarthritis, unspecified site: Secondary | ICD-10-CM | POA: Insufficient documentation

## 2015-09-10 DIAGNOSIS — Z8619 Personal history of other infectious and parasitic diseases: Secondary | ICD-10-CM | POA: Insufficient documentation

## 2015-09-10 MED ORDER — GABAPENTIN 300 MG PO CAPS: 300.0000 mg | ORAL_CAPSULE | Freq: Two times a day (BID) | ORAL | Status: DC

## 2015-09-10 NOTE — Telephone Encounter (Signed)
I would advise she see one of the [facility] docs (Dr. Kirtland BouchardK, [place], or [place]) but it is up to her whether she wants to go to urgent care

## 2015-09-10 NOTE — ED Provider Notes (Signed)
CSN: [id number]     Arrival date & time 09/10/15  0815 History   First MD Initiated Contact with Patient 09/10/15 [phone]     Chief Complaint  Patient presents with  . Leg Pain     (Consider location/radiation/quality/duration/timing/severity/associated sxs/prior Treatment) HPI Comments: Pt comes in with c/o pain that radiates from the lower back down the leg. Denies numbness or weakness. States that she has tried her prescription medications and otc without relief. She has a history of sciatica in the other leg and she remembers gabapentin working. She has problems with cartilage in the knee.  The history is provided by the patient. No language interpreter was used.    Past Medical History  Diagnosis Date  . History of chicken pox   . Headache(784.0)   . Hypertension   . Osteoarthritis   . Morbid obesity [facility])    Past Surgical History  Procedure Laterality Date  . Tubal ligation  1985   Family History  Problem Relation Age of Onset  . Arthritis Mother   . Prostate cancer Maternal Grandfather   . Stroke    . Hypertension Mother   . Breast cancer Maternal Grandfather    Social History  Substance Use Topics  . Smoking status: Never Smoker   . Smokeless tobacco: Never Used  . Alcohol Use: No   OB History    No data available     Review of Systems  All other systems reviewed and are negative.     Allergies  Review of patient's allergies indicates no known allergies.  Home Medications   Prior to Admission medications   Medication Sig Start Date End Date Taking? Authorizing Provider  Amlodipine-Valsartan-HCTZ 10-320-25 MG TABS Take 1 tablet by mouth daily. 12/21/14   Doe-Hyun R Artist Pais, DO  atorvastatin (LIPITOR) 20 MG tablet Take 1 tablet (20 mg total) by mouth daily. 12/24/14   Doe-Hyun R Artist Pais, DO  carvedilol (COREG) 25 MG tablet Take 1 tablet (25 mg total) by mouth 2 (two) times daily with a meal. 02/17/15   Nelwyn Salisbury, MD  furosemide (LASIX) 40 MG tablet Take 1.5  tablets (60 mg total) by mouth daily. 01/21/15   Shon Baton, MD  gabapentin (NEURONTIN) 300 MG capsule Take 1 capsule (300 mg total) by mouth 2 (two) times daily. 09/10/15   Teressa Lower, NP  hydrALAZINE (APRESOLINE) 50 MG tablet Take 1 tablet (50 mg total) by mouth 2 (two) times daily. 08/09/15   Nelwyn Salisbury, MD  meloxicam (MOBIC) 15 MG tablet Take 15 mg by mouth daily.    Historical Provider, MD  nitrofurantoin, macrocrystal-monohydrate, (MACROBID) 100 MG capsule Take 1 capsule (100 mg total) by mouth 2 (two) times daily. Patient not taking: Reported on 08/09/2015 05/14/15   Terressa Koyanagi, DO  oxycodone (ROXICODONE) 30 MG immediate release tablet Take 1 tablet (30 mg total) by mouth every 8 (eight) hours as needed for pain. 08/09/15   Nelwyn Salisbury, MD  spironolactone (ALDACTONE) 25 MG tablet Take 1 tablet (25 mg total) by mouth daily. 12/21/14   Doe-Hyun R Artist Pais, DO   BP 210/107 mmHg  Pulse 106  Temp(Src) 98.3 F (36.8 C) (Oral)  Resp 18  SpO2 96% Physical Exam  Constitutional: She is oriented to person, place, and time. She appears well-developed and well-nourished.  Cardiovascular: Normal rate and regular rhythm.   Pulmonary/Chest: Effort normal and breath sounds normal.  Musculoskeletal:  Left lumbar spine tenderness. Equal strength and sensation to bilateral lower extremities  Neurological: She is alert and oriented to person, place, and time. She exhibits normal muscle tone. Coordination normal.  Skin: Skin is warm and dry.  Nursing note and vitals reviewed.   ED Course  Procedures (including critical care time) Labs Review Labs Reviewed - No data to display  Imaging Review No results found. I have personally reviewed and evaluated these images and lab results as part of my medical decision-making.   EKG Interpretation None      MDM   Final diagnoses:  Sciatica of right side    Pt given gabapentin as has worked in the past. No red flag symptoms. Discussed  bp with pt    Teressa LowerVrinda Kavari Parrillo, NP 09/10/15 [id number]  Loren Raceravid Yelverton, MD 09/10/15 [phone]

## 2015-09-10 NOTE — ED Notes (Signed)
See PA assessment 

## 2015-09-10 NOTE — Discharge Instructions (Signed)

## 2015-09-10 NOTE — ED Notes (Signed)
Per PT, Pt hs HX of Sciatica. Pt reports having no cartilage in the right knee with need to wear brace. Pt reports pain starting in her right foot to the right hip increasing with standing and ambulation, but relieved with sitting.

## 2015-11-03 ENCOUNTER — Ambulatory Visit (INDEPENDENT_AMBULATORY_CARE_PROVIDER_SITE_OTHER): Payer: Self-pay | Admitting: Family Medicine

## 2015-11-03 ENCOUNTER — Encounter: Payer: Self-pay | Admitting: Family Medicine

## 2015-11-03 ENCOUNTER — Ambulatory Visit: Payer: Self-pay | Admitting: Internal Medicine

## 2015-11-03 VITALS — BP 152/94 | HR 86 | Temp 98.7°F | Ht 69.0 in | Wt 355.0 lb

## 2015-11-03 DIAGNOSIS — G8929 Other chronic pain: Secondary | ICD-10-CM

## 2015-11-03 DIAGNOSIS — E785 Hyperlipidemia, unspecified: Secondary | ICD-10-CM

## 2015-11-03 DIAGNOSIS — M545 Low back pain: Secondary | ICD-10-CM

## 2015-11-03 DIAGNOSIS — I10 Essential (primary) hypertension: Secondary | ICD-10-CM

## 2015-11-03 DIAGNOSIS — I5032 Chronic diastolic (congestive) heart failure: Secondary | ICD-10-CM

## 2015-11-03 MED ORDER — SPIRONOLACTONE 25 MG PO TABS: 25.0000 mg | ORAL_TABLET | Freq: Every day | ORAL | Status: DC

## 2015-11-03 MED ORDER — OXYCODONE HCL 30 MG PO TABS
30.0000 mg | ORAL_TABLET | Freq: Three times a day (TID) | ORAL | Status: DC | PRN
Start: 2015-11-03 — End: 2016-01-26

## 2015-11-03 MED ORDER — FUROSEMIDE 40 MG PO TABS: 60.0000 mg | ORAL_TABLET | Freq: Every day | ORAL | Status: DC

## 2015-11-03 MED ORDER — OXYCODONE HCL 30 MG PO TABS: 30.0000 mg | ORAL_TABLET | Freq: Three times a day (TID) | ORAL | Status: DC | PRN

## 2015-11-03 MED ORDER — ATORVASTATIN CALCIUM 20 MG PO TABS: 20.0000 mg | ORAL_TABLET | Freq: Every day | ORAL | Status: DC

## 2015-11-03 MED ORDER — AMLODIPINE-VALSARTAN-HCTZ 10-320-25 MG PO TABS: 1.0000 | ORAL_TABLET | Freq: Every day | ORAL | Status: DC

## 2015-11-03 MED ORDER — CARVEDILOL 25 MG PO TABS: 25.0000 mg | ORAL_TABLET | Freq: Two times a day (BID) | ORAL | Status: DC

## 2015-11-03 NOTE — Progress Notes (Signed)
Pre visit review using our clinic review tool, if applicable. No additional management support is needed unless otherwise documented below in the visit note. 

## 2015-11-03 NOTE — Progress Notes (Signed)
   Subjective:    Patient ID: [name], female    DOB: 02-27-1966, 50 y.o.   MRN: [id number]  HPI Here to follow up. She is doing well except for chronic back pain. She remains active. Her BP is stable at home.    Review of Systems  Constitutional: Negative.   Respiratory: Negative.   Cardiovascular: Negative.   Musculoskeletal: Positive for back pain.  Neurological: Negative.        Objective:   Physical Exam  Constitutional: She appears well-developed and well-nourished.  Neck: No thyromegaly present.  Cardiovascular: Normal rate, regular rhythm, normal heart sounds and intact distal pulses.   Pulmonary/Chest: Effort normal and breath sounds normal. She has no wheezes.  Lymphadenopathy:    She has no cervical adenopathy.          Assessment & Plan:  Her HTN is stable. The CHF is stable. The back pain is stable.

## 2016-01-20 ENCOUNTER — Ambulatory Visit: Payer: Self-pay | Admitting: Adult Health

## 2016-01-26 ENCOUNTER — Encounter: Payer: Self-pay | Admitting: Adult Health

## 2016-01-26 ENCOUNTER — Ambulatory Visit (INDEPENDENT_AMBULATORY_CARE_PROVIDER_SITE_OTHER): Payer: Self-pay | Admitting: Adult Health

## 2016-01-26 VITALS — BP 153/98 | Temp 98.4°F | Ht 69.0 in | Wt 364.4 lb

## 2016-01-26 DIAGNOSIS — M545 Low back pain, unspecified: Secondary | ICD-10-CM

## 2016-01-26 DIAGNOSIS — I10 Essential (primary) hypertension: Secondary | ICD-10-CM

## 2016-01-26 DIAGNOSIS — G8929 Other chronic pain: Secondary | ICD-10-CM

## 2016-01-26 MED ORDER — OXYCODONE HCL 30 MG PO TABS: 30.0000 mg | ORAL_TABLET | Freq: Three times a day (TID) | ORAL | Status: DC | PRN

## 2016-01-26 NOTE — Progress Notes (Signed)
   Subjective:    Patient ID: [name], female    DOB: May 17, 1966, 50 y.o.   MRN: [id number]  HPI  50 year old female, patient of Dr. Artist PaisYoo, who presents to the office today for follow up regarding hypertension and chronic back pain. She reports that her blood pressure at home have been in the 160-170's systolic. She denies any headaches or blurred vision. She reports that she is taking her medication every day. Is eating healthier but is unable to exercise due to chronic pain and her daughter being treated for cancer in [place] at [facility].   Today in the office her BP is 153/98.   She is on disability and followed by orthopedics. Dr. Artist PaisYoo was prescribing her oxycodone for pain.   See pertinent positives and negatives per HPI.    Review of Systems  All other systems reviewed and are negative.      Objective:   Physical Exam  Constitutional: She is oriented to person, place, and time. She appears well-developed and well-nourished. No distress.  obese  Cardiovascular: Normal rate, regular rhythm, normal heart sounds and intact distal pulses.   Pulmonary/Chest: Effort normal and breath sounds normal. No respiratory distress. She has no wheezes. She has no rales. She exhibits no tenderness.  Neurological: She is alert and oriented to person, place, and time. She has normal reflexes.  Skin: Skin is warm and dry. No rash noted. She is not diaphoretic. No erythema. No pallor.  Psychiatric: She has a normal mood and affect. Her behavior is normal. Judgment and thought content normal.  Nursing note and vitals reviewed.     Assessment & Plan:  1. Essential hypertension - BP stable for patient. I am not going to change any of the medications at this point.  - Continue to monitor at home.  - Follow up as needed 2. Chronic low back pain  - oxycodone (ROXICODONE) 30 MG immediate release tablet; Take 1 tablet (30 mg total) by mouth every 8 (eight) hours as needed for pain.  Dispense: 90  tablet; Refill: 0  3. Morbid obesity, unspecified obesity type (HCC) - Reinforced diet and exercise.   Shirline Freesory Nettye Flegal, NP

## 2016-01-26 NOTE — Patient Instructions (Addendum)
It was great meeting you today.   Follow up with me to establish care   I am not going to change any of your blood pressure medications at this time.   Continue to eat healthy.

## 2016-02-28 ENCOUNTER — Telehealth: Payer: Self-pay | Admitting: Internal Medicine

## 2016-02-28 NOTE — Telephone Encounter (Signed)
Using Medication for chronic lowe back pain Last OV 01/26/2016 Last refill 01/26/2016 #90 Pending appt 04/04/2016 Please advise Thanks

## 2016-02-28 NOTE — Telephone Encounter (Signed)
Pt need new Rx for Oxycodone °

## 2016-02-29 ENCOUNTER — Other Ambulatory Visit: Payer: Self-pay

## 2016-02-29 DIAGNOSIS — G8929 Other chronic pain: Secondary | ICD-10-CM

## 2016-02-29 DIAGNOSIS — M545 Low back pain: Principal | ICD-10-CM

## 2016-02-29 MED ORDER — OXYCODONE HCL 30 MG PO TABS: 30.0000 mg | ORAL_TABLET | Freq: Three times a day (TID) | ORAL | Status: DC | PRN

## 2016-02-29 NOTE — Telephone Encounter (Signed)
Ok to refill for one month. I would like her to follow up with Orthopedics for future refills

## 2016-02-29 NOTE — Telephone Encounter (Signed)
Patient notified Rx is ready for pick-up. 

## 2016-03-27 ENCOUNTER — Telehealth: Payer: Self-pay | Admitting: Internal Medicine

## 2016-03-27 NOTE — Telephone Encounter (Signed)
Pt needs new rx oxycodone. Pt has an appt to est with cory on 04-04-16

## 2016-03-27 NOTE — Telephone Encounter (Signed)
Patient last seen: 01/26/16 Last refill: 02/29/16 #90 - 0 refills. Prescribed to take q8 by Kandee Keenory  Established appointment with cory: 04/04/16  Please advise on refill?

## 2016-03-28 NOTE — Telephone Encounter (Signed)
Ok to refill for one month  

## 2016-03-29 ENCOUNTER — Other Ambulatory Visit: Payer: Self-pay | Admitting: *Deleted

## 2016-03-29 DIAGNOSIS — M545 Low back pain: Principal | ICD-10-CM

## 2016-03-29 DIAGNOSIS — G8929 Other chronic pain: Secondary | ICD-10-CM

## 2016-03-29 MED ORDER — OXYCODONE HCL 30 MG PO TABS: 30.0000 mg | ORAL_TABLET | Freq: Three times a day (TID) | ORAL | Status: DC | PRN

## 2016-03-29 NOTE — Telephone Encounter (Signed)
Rx upfront and ready. Patient is aware.

## 2016-03-29 NOTE — Telephone Encounter (Signed)
Patient aware Rx is ready & up front.  

## 2016-04-04 ENCOUNTER — Encounter: Payer: Self-pay | Admitting: Adult Health

## 2016-04-04 ENCOUNTER — Ambulatory Visit (INDEPENDENT_AMBULATORY_CARE_PROVIDER_SITE_OTHER): Payer: Self-pay | Admitting: Adult Health

## 2016-04-04 VITALS — BP 158/100 | Temp 101.9°F | Wt 347.0 lb

## 2016-04-04 DIAGNOSIS — Z7189 Other specified counseling: Secondary | ICD-10-CM

## 2016-04-04 DIAGNOSIS — G8929 Other chronic pain: Secondary | ICD-10-CM

## 2016-04-04 DIAGNOSIS — I10 Essential (primary) hypertension: Secondary | ICD-10-CM

## 2016-04-04 DIAGNOSIS — M545 Low back pain: Secondary | ICD-10-CM

## 2016-04-04 DIAGNOSIS — Z7689 Persons encountering health services in other specified circumstances: Secondary | ICD-10-CM

## 2016-04-04 NOTE — Patient Instructions (Signed)
It was great seeing you again!  Please follow up for your physical this fall. If you need anything in the meantime, please let me know  Continue to work on weight loss... You are doing a great job!

## 2016-04-04 NOTE — Progress Notes (Signed)
Patient presents to clinic today to establish care. She is a pleasant 50 year old AA female who  has a past medical history of History of chicken pox; Headache(784.0); Hypertension; Osteoarthritis; and Morbid obesity (HCC).  Her last physical was 12/20/2013  Acute Concerns: Establish Care   Chronic Issues: Hypertension  - She feels as though her blood pressure is getting better controlled. She knows that it is not at goal but she is working on it.    Chronic Low Back Pain  - Take  oxycodone. This helps with the pain but does not completely relieve it. Most of her pain comes the lower back and right knee. She does not have insurance and cannot afford to see orthopedics.   Morbid Obesity  - She is working on weight loss. She is eating healthier but is still unable to be exercise or be as active as she would like to be due to chronic pain.   Wt Readings from Last 3 Encounters:  04/04/16 347 lb (157.398 kg)  01/26/16 364 lb 6.4 oz (165.291 kg)  11/03/15 355 lb (161.027 kg)    Health Maintenance: Dental -- Last year  Vision -- Does not see  Immunizations --UTD  Colonoscopy -- Is due but has no insurance  Mammogram -- 2014 -  PAP --  Does not know  Bone Density -- Has not had   Diet: She is trying to lose weight by eating healthy. She is unable to exercise due to chronic back and right knee pain  Exercise: Does not exercise    Past Medical History  Diagnosis Date  . History of chicken pox   . Headache(784.0)   . Hypertension   . Osteoarthritis   . Morbid obesity [facility])     Past Surgical History  Procedure Laterality Date  . Tubal ligation  1985    Current Outpatient Prescriptions on File Prior to Visit  Medication Sig Dispense Refill  . Amlodipine-Valsartan-HCTZ 10-320-25 MG TABS Take 1 tablet by mouth daily. 90 tablet 3  . atorvastatin (LIPITOR) 20 MG tablet Take 1 tablet (20 mg total) by mouth daily. 90 tablet 3  . carvedilol (COREG) 25 MG tablet Take 1  tablet (25 mg total) by mouth 2 (two) times daily with a meal. 180 tablet 3  . furosemide (LASIX) 40 MG tablet Take 1.5 tablets (60 mg total) by mouth daily. 135 tablet 3  . oxycodone (ROXICODONE) 30 MG immediate release tablet Take 1 tablet (30 mg total) by mouth every 8 (eight) hours as needed for pain. 90 tablet 0  . spironolactone (ALDACTONE) 25 MG tablet Take 1 tablet (25 mg total) by mouth daily. 90 tablet 3   No current facility-administered medications on file prior to visit.    No Known Allergies  Family History  Problem Relation Age of Onset  . Arthritis Mother   . Prostate cancer Maternal Grandfather   . Stroke    . Hypertension Mother   . Breast cancer Maternal Grandfather     Social History   Social History  . Marital Status: Divorced    Spouse Name: N/A  . Number of Children: N/A  . 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 Smoking status: Never Smoker   . Smokeless tobacco: Never Used  . Alcohol Use: No  . Drug Use: No  . Sexual Activity: Not on file   Other Topics Concern  . Not  on file   Social History Narrative   Works at Naval architectwarehouse for [employer]    Review of Systems  Constitutional: Negative.   Respiratory: Negative.   Cardiovascular: Negative.   Gastrointestinal: Negative.   Musculoskeletal: Positive for back pain and joint pain. Negative for falls.  Neurological: Negative.   Psychiatric/Behavioral: Negative.   All other systems reviewed and are negative.   BP 158/100 mmHg  Temp(Src) 101.9 F (38.8 C) (Oral)  Wt 347 lb (157.398 kg)  Physical Exam  Constitutional: She is well-developed, well-nourished, and in no distress. No distress.  HENT:  Head: Normocephalic and atraumatic.  Right Ear: External ear normal.  Left Ear: External ear normal.  Nose: Nose normal.  Mouth/Throat: Oropharynx is clear and moist. No oropharyngeal exudate.  Cardiovascular: Normal  rate, regular rhythm, normal heart sounds and intact distal pulses.  Exam reveals no gallop and no friction rub.   No murmur heard. Pulmonary/Chest: Effort normal and breath sounds normal. No respiratory distress. She has no wheezes. She has no rales. She exhibits no tenderness.  Abdominal: Soft. Bowel sounds are normal.  Musculoskeletal: She exhibits tenderness. She exhibits no edema.  Is using a cane  Neurological: She is alert. Gait normal. GCS score is 15.  Skin: Skin is warm and dry. No rash noted. She is not diaphoretic. No erythema. No pallor.  Psychiatric: Mood, memory, affect and judgment normal.  Nursing note and vitals reviewed.  Assessment/Plan: 1. Encounter to establish care - Follow up for CPE - She is hoping to get medical insurance this month. She will get her mammogram and colonoscopy done at that time as well as see orthopedics.   2. Essential hypertension - Not at goal  - she cannot afford another medication at this time  3. Chronic low back pain - Continue with current therpay  - Once she is able to obtain medical insurance I will send her to orthopedics   4. Morbid obesity, unspecified obesity type (HCC) - She is doing well with losing weight - Continue to eat healthy and exercise.   Shirline Freesory Santonio Speakman, NP

## 2016-04-16 ENCOUNTER — Encounter (HOSPITAL_COMMUNITY): Payer: Self-pay | Admitting: Emergency Medicine

## 2016-04-16 ENCOUNTER — Emergency Department (HOSPITAL_BASED_OUTPATIENT_CLINIC_OR_DEPARTMENT_OTHER)
Admit: 2016-04-16 | Discharge: 2016-04-16 | Disposition: A | Discharge status: Home / Self Care | Payer: Self-pay | Attending: Emergency Medicine | Admitting: Emergency Medicine

## 2016-04-16 ENCOUNTER — Emergency Department (HOSPITAL_COMMUNITY)
Admission: EM | Admit: 2016-04-16 | Discharge: 2016-04-16 | Disposition: A | Discharge status: Home / Self Care | Payer: Self-pay | Attending: Emergency Medicine | Admitting: Emergency Medicine

## 2016-04-16 DIAGNOSIS — M79609 Pain in unspecified limb: Secondary | ICD-10-CM

## 2016-04-16 DIAGNOSIS — I82432 Acute embolism and thrombosis of left popliteal vein: Secondary | ICD-10-CM | POA: Insufficient documentation

## 2016-04-16 DIAGNOSIS — I509 Heart failure, unspecified: Secondary | ICD-10-CM | POA: Insufficient documentation

## 2016-04-16 DIAGNOSIS — I110 Hypertensive heart disease with heart failure: Secondary | ICD-10-CM | POA: Insufficient documentation

## 2016-04-16 DIAGNOSIS — M7989 Other specified soft tissue disorders: Secondary | ICD-10-CM

## 2016-04-16 DIAGNOSIS — Z79899 Other long term (current) drug therapy: Secondary | ICD-10-CM | POA: Insufficient documentation

## 2016-04-16 DIAGNOSIS — Z7901 Long term (current) use of anticoagulants: Secondary | ICD-10-CM | POA: Insufficient documentation

## 2016-04-16 LAB — BASIC METABOLIC PANEL
ANION GAP: 6 (ref 5–15)
BUN: 9 mg/dL (ref 6–20)
CHLORIDE: 102 mmol/L (ref 101–111)
CO2: 27 mmol/L (ref 22–32)
Calcium: 9.2 mg/dL (ref 8.9–10.3)
Creatinine, Ser: 0.79 mg/dL (ref 0.44–1.00)
GFR calc non Af Amer: >60 mL/min (ref >60)
Glucose, Bld: 94 mg/dL (ref 65–99)
POTASSIUM: 3.7 mmol/L (ref 3.5–5.1)
SODIUM: 135 mmol/L (ref 135–145)

## 2016-04-16 LAB — CBC WITH DIFFERENTIAL/PLATELET
Basophils Absolute: 0 10*3/uL (ref 0.0–0.1)
Basophils Relative: 0 %
EOS ABS: 0.1 10*3/uL (ref 0.0–0.7)
Eosinophils Relative: 1 %
HCT: 33.9 % — ABNORMAL LOW (ref 36.0–46.0)
HEMOGLOBIN: 11.3 g/dL — AB (ref 12.0–15.0)
LYMPHS ABS: 2.5 10*3/uL (ref 0.7–4.0)
LYMPHS PCT: 27 %
MCH: 29.1 pg (ref 26.0–34.0)
MCHC: 33.3 g/dL (ref 30.0–36.0)
MCV: 87.4 fL (ref 78.0–100.0)
Monocytes Absolute: 0.8 10*3/uL (ref 0.1–1.0)
Monocytes Relative: 8 %
NEUTROS PCT: 64 %
Neutro Abs: 5.9 10*3/uL (ref 1.7–7.7)
Platelets: 314 10*3/uL (ref 150–400)
RBC: 3.88 MIL/uL (ref 3.87–5.11)
RDW: 12.9 % (ref 11.5–15.5)
WBC: 9.3 10*3/uL (ref 4.0–10.5)

## 2016-04-16 MED ORDER — HYDROMORPHONE HCL 1 MG/ML IJ SOLN
1.0000 mg | Freq: Once | INTRAMUSCULAR | Status: AC
  Administered 2016-04-16: 1 mg via INTRAVENOUS
  Filled 2016-04-16: qty 1

## 2016-04-16 MED ORDER — RIVAROXABAN 15 MG PO TABS
15.0000 mg | ORAL_TABLET | Freq: Once | ORAL | Status: AC
  Administered 2016-04-16: 15 mg via ORAL
  Filled 2016-04-16: qty 1

## 2016-04-16 MED ORDER — RIVAROXABAN (XARELTO) EDUCATION KIT FOR DVT/PE PATIENTS
PACK | Freq: Once | Status: DC
  Filled 2016-04-16: qty 1

## 2016-04-16 MED ORDER — RIVAROXABAN (XARELTO) VTE STARTER PACK (15 & 20 MG): ORAL_TABLET | ORAL | 0 refills | Status: DC

## 2016-04-16 NOTE — ED Notes (Signed)
Spoke with Pharmacist in ED regarding Xarelto and education.

## 2016-04-16 NOTE — ED Notes (Signed)
Spoke to Vascular Tech, they are aware of patient

## 2016-04-16 NOTE — ED Provider Notes (Signed)
[facility] DEPT Provider Note   CSN: [id number] Arrival date & time: 04/16/16  1032  First Provider Contact:  First MD Initiated Contact with Patient 04/16/16 1243        History   Chief Complaint Chief Complaint  Patient presents with  . Leg Pain    calf area    HPI [name] is a 50 y.o. female.  HPI [name] is a 50 y.o. female with PMH significant for CHF, hypertension, morbid obesity, and osteoarthritis who presents with sudden onset, constant, worsening left calf pain that began a couple of days ago. She states it started as a cramp and has now just become extremely sore with swelling. She states she has now had trouble ambulating due to pain. She has used ice and taken her home oxycodone without relief. No modifying factors. Denies chest pain or shortness of breath. She states she had a fever sometime last week but that is resolved. No history of DVT/PE. No recent trauma or injury to the leg.  Past Medical History:  Diagnosis Date  . CHF (congestive heart failure) ([place])   . Headache(784.0)   . History of chicken pox   . Hypertension   . Morbid obesity ([place])   . Osteoarthritis     Patient Active Problem List   Diagnosis Date Noted  . SOB (shortness of breath) 02/17/2015  . Myalgia 07/24/2014  . Hyperlipidemia 03/11/2014  . Generalized anxiety disorder 01/14/2014  . Chronic diastolic heart failure ([place]) 10/17/2013  . Dyspnea 08/20/2013  . Hypertension 07/22/2012  . Osteoarthritis of both knees 07/22/2012  . Morbid obesity ([place]) 07/22/2012  . Left ankle pain 07/22/2012  . Right knee pain 07/22/2012  . Chronic low back pain 07/22/2012  . Polyuria 07/22/2012    Past Surgical History:  Procedure Laterality Date  . TUBAL LIGATION  1985    OB History    No data available       Home Medications    Prior to Admission medications   Medication Sig Start Date End Date Taking? Authorizing Provider  Amlodipine-Valsartan-HCTZ 10-320-25 MG TABS  Take 1 tablet by mouth daily. 11/03/15  Yes Laurey Morale, MD  atorvastatin (LIPITOR) 20 MG tablet Take 1 tablet (20 mg total) by mouth daily. 11/03/15  Yes Laurey Morale, MD  carvedilol (COREG) 25 MG tablet Take 1 tablet (25 mg total) by mouth 2 (two) times daily with a meal. 11/03/15  Yes Laurey Morale, MD  furosemide (LASIX) 40 MG tablet Take 1.5 tablets (60 mg total) by mouth daily. 11/03/15  Yes Laurey Morale, MD  oxycodone (ROXICODONE) 30 MG immediate release tablet Take 1 tablet (30 mg total) by mouth every 8 (eight) hours as needed for pain. 03/29/16  Yes Dorothyann Peng, NP  spironolactone (ALDACTONE) 25 MG tablet Take 1 tablet (25 mg total) by mouth daily. 11/03/15  Yes Laurey Morale, MD  Rivaroxaban 15 & 20 MG TBPK Take as directed on package: Start with one 83m tablet by mouth twice a day with food. On Day 22, switch to one 253mtablet once a day with food. 04/16/16   KaGloriann LoanPA-C    Family History Family History  Problem Relation Age of Onset  . Arthritis Mother   . Hypertension Mother   . Prostate cancer Maternal Grandfather   . Breast cancer Maternal Grandmother     Social History Social History  Substance Use Topics  . Smoking status: Never Smoker  . Smokeless tobacco: Never  Used  . Alcohol use No     Allergies   Review of patient's allergies indicates no known allergies.   Review of Systems Review of Systems All other systems negative unless otherwise stated in HPI   Physical Exam Updated Vital Signs BP (!) 130/116   Pulse 96   Temp 99 F (37.2 C) (Oral)   Resp 16   Ht 5' 10"  (1.778 m)   Wt (!) 157.2 kg   LMP 04/09/2016   SpO2 100%   BMI 49.72 kg/m   Physical Exam  Constitutional: She is oriented to person, place, and time. She appears well-developed and well-nourished.  Non-toxic appearance. She does not have a sickly appearance. She does not appear ill.  HENT:  Head: Normocephalic and atraumatic.  Mouth/Throat: Oropharynx is clear and moist.  Eyes:  Conjunctivae are normal.  Neck: Normal range of motion. Neck supple.  Cardiovascular: Normal rate and regular rhythm.   Pulses:      Dorsalis pedis pulses are 2+ on the right side, and 2+ on the left side.  1-2+ pitting edema of the left lower extremity to the knee. No right lower extremity edema. No phlegmasia cerulea dolens.  Pulmonary/Chest: Effort normal and breath sounds normal. No accessory muscle usage or stridor. No respiratory distress. She has no wheezes. She has no rhonchi. She has no rales.  Abdominal: Soft. Bowel sounds are normal. She exhibits no distension. There is no tenderness.  Musculoskeletal: Normal range of motion.  Left Popliteal fossa exquisitely tender to palpation.  Lymphadenopathy:    She has no cervical adenopathy.  Neurological: She is alert and oriented to person, place, and time.  Speech clear without dysarthria.  Skin: Skin is warm and dry.  No pallor, poikilothermia, erythema.  No palpable cords.   Psychiatric: She has a normal mood and affect. Her behavior is normal.     ED Treatments / Results  Labs (all labs ordered are listed, but only abnormal results are displayed) Labs Reviewed  CBC WITH DIFFERENTIAL/PLATELET - Abnormal; Notable for the following:       Result Value   Hemoglobin 11.3 (*)    HCT 33.9 (*)    All other components within normal limits  BASIC METABOLIC PANEL    EKG  EKG Interpretation None       Radiology No results found.  Procedures Procedures (including critical care time)  Medications Ordered in ED Medications  rivaroxaban (XARELTO) Education Kit for DVT/PE patients (not administered)  Rivaroxaban (XARELTO) tablet 15 mg (not administered)  HYDROmorphone (DILAUDID) injection 1 mg (1 mg Intravenous Given 04/16/16 1330)  HYDROmorphone (DILAUDID) injection 1 mg (1 mg Intravenous Given 04/16/16 1538)     Initial Impression / Assessment and Plan / ED Course  I have reviewed the triage vital signs and the nursing  notes.  Pertinent labs & imaging results that were available during my care of the patient were reviewed by me and considered in my medical decision making (see chart for details).  Clinical Course   Patient presents with unilateral lower extremity edema. VSS, NAD. No chest pain or shortness of breath. She does have 1-2+ pitting edema of the left lower extremity to the knee. Exquisite tenderness in popliteal fossa. Patient is high risk for DVT with well's score 3. We'll obtain basic labs and left lower extremity ultrasound for further evaluation. Low suspicion for arterial thrombus given physical exam findings. Labs without acute abnormalities. Ultrasound positive for DVT. She has no chest pain or shortness of breath  to suggest PE.  Hemodynamically stable.  No evidence of renal insufficiency. Low risk bleeding.  Patient stable for outpatient anticoagulation with Xarelto. Follow up PCP.  Return precautions discussed including CP or SOB.  Patient agrees and acknowledges the above plan for discharge.  Case has been discussed with Dr. Laverta Baltimore who agrees with the above plan for discharge.    Final Clinical Impressions(s) / ED Diagnoses   Final diagnoses:  Deep vein thrombosis (DVT) of popliteal vein of left lower extremity, unspecified chronicity (HCC)    New Prescriptions New Prescriptions   RIVAROXABAN 15 & 20 MG TBPK    Take as directed on package: Start with one 52m tablet by mouth twice a day with food. On Day 22, switch to one 223mtablet once a day with food.     KaGloriann LoanPA-C 04/16/16 16CotterMD 04/16/16 [phone]

## 2016-04-16 NOTE — Progress Notes (Signed)
Provided rivaroxaban teaching and education handout to patient. Unable to document in education tab in Epic. Patient verbalized understanding.  York Cerise, PharmD Pharmacy Resident Pager [phone]

## 2016-04-16 NOTE — Discharge Instructions (Addendum)
Information on my medicine - XARELTO (rivaroxaban)  This medication education was reviewed with me or my healthcare representative as part of my discharge preparation.  The pharmacist that spoke with me during my hospital stay was:  Cherre Huger, [facility]  WHY WAS [name] PRESCRIBED FOR YOU? Xarelto was prescribed to treat blood clots that may have been found in the veins of your legs (deep vein thrombosis) or in your lungs (pulmonary embolism) and to reduce the risk of them occurring again.  What do you need to know about Xarelto? The starting dose is one 15 mg tablet taken TWICE daily with food for the FIRST 21 DAYS then on 8/14 the dose is changed to one 20 mg tablet taken ONCE A DAY with your evening meal.  DO NOT stop taking Xarelto without talking to the health care provider who prescribed the medication.  Refill your prescription for 20 mg tablets before you run out.  After discharge, you should have regular check-up appointments with your healthcare provider that is prescribing your Xarelto.  In the future your dose may need to be changed if your kidney function changes by a significant amount.  What do you do if you miss a dose? If you are taking Xarelto TWICE DAILY and you miss a dose, take it as soon as you remember. You may take two 15 mg tablets (total 30 mg) at the same time then resume your regularly scheduled 15 mg twice daily the next day.  If you are taking Xarelto ONCE DAILY and you miss a dose, take it as soon as you remember on the same day then continue your regularly scheduled once daily regimen the next day. Do not take two doses of Xarelto at the same time.   Important Safety Information Xarelto is a blood thinner medicine that can cause bleeding. You should call your healthcare provider right away if you experience any of the following: ? Bleeding from an injury or your nose that does not stop. ? Unusual colored urine (red or dark brown) or unusual colored  stools (red or black). ? Unusual bruising for unknown reasons. ? A serious fall or if you hit your head (even if there is no bleeding).  Some medicines may interact with Xarelto and might increase your risk of bleeding while on Xarelto. To help avoid this, consult your healthcare provider or pharmacist prior to using any new prescription or non-prescription medications, including herbals, vitamins, non-steroidal anti-inflammatory drugs (NSAIDs) and supplements.  This website has more information on Xarelto: VisitDestination.com.br.

## 2016-04-16 NOTE — ED Triage Notes (Signed)
Pt. Stated, I started having left leg pain at the calf area for 2 days to the point of not being able to walk.

## 2016-04-16 NOTE — Progress Notes (Signed)
VASCULAR LAB PRELIMINARY  PRELIMINARY  PRELIMINARY  PRELIMINARY  Bilateral lower extremity venous duplex completed.    Preliminary report:  There is acute DVT noted in the left popliteal vein.   Called report to Cheri Fowler, PA-C  Laura Radilla, RVT 04/16/2016, 2:45 PM

## 2016-04-24 ENCOUNTER — Telehealth: Payer: Self-pay | Admitting: Adult Health

## 2016-04-24 NOTE — Telephone Encounter (Signed)
Pt request refill  oxycodone (ROXICODONE) 30 MG immediate release tablet   Pt also went to ED 7/24 and dx with blood clot.   Pt wants to know if ok to walk on leg Pt wants to know if ok to travel to [place] with the blood clot. Pt also prescribed Xarelto. Transferred pt to triage for this inforamtion.   Pt has also made ov to follow up from the hospital on Tues, 8/1

## 2016-04-24 NOTE — Telephone Encounter (Signed)
I spoke with pt and she is on the schedule to see [facility] NP in the morning 04/25/2016.

## 2016-04-24 NOTE — Telephone Encounter (Signed)
I agree with the nurse's advice about physical activity. She will need to wait and get the oxycodone refill tomorrow

## 2016-04-24 NOTE — Telephone Encounter (Signed)
Patient Name: SHIMERE LEVERE DOB: 05/29/1966 Initial Comment Caller states she has DVT blood clot in left leg, wants to know how much activity is safe till next appt, and if able to drive Nurse Assessment Nurse: Yetta Barre, RN, [place] Date/Time (Eastern Time): 04/24/2016 10:59:44 AM Confirm and document reason for call. If symptomatic, describe symptoms. You must click the next button to save text entered. ---Caller states she was diagnosed with a DVT last week and needs to know if it is ok for her to be active and to travel in a car for 1.5 hrs. She has follow up appt tomorrow Has the patient traveled out of the country within the last 30 days? ---Not Applicable Does the patient have any new or worsening symptoms? ---No Please document clinical information provided and list any resource used. ---No limitations listed on ED discharge. Told caller normal activity is fine and travel is fine but she needs to stop and walk around every 45 min. http://circ.ahajournals.org/content/129/17/ e477.full Many patients worry that being physically active might cause a DVT to break off and become a PE. The risk of clot breaking off and forming a PE is mostly present in the first few days, up to #4 weeks, while the clot is still fresh, fragile, and not scarred. However, patients who carry out normal daily activities after a clot are no more likely to develop PE than those who dont walk around. Thus, being physically active after a clot is generally fine and is typically encouraged. Guidelines Guideline Title Affirmed Question Affirmed Notes Final Disposition User Clinical Call Yetta Barre, RN, [employer]

## 2016-04-25 ENCOUNTER — Ambulatory Visit (INDEPENDENT_AMBULATORY_CARE_PROVIDER_SITE_OTHER): Payer: Self-pay | Admitting: Adult Health

## 2016-04-25 ENCOUNTER — Encounter: Payer: Self-pay | Admitting: Adult Health

## 2016-04-25 VITALS — BP 158/92 | Temp 98.1°F | Ht 70.0 in | Wt 343.1 lb

## 2016-04-25 DIAGNOSIS — I82402 Acute embolism and thrombosis of unspecified deep veins of left lower extremity: Secondary | ICD-10-CM

## 2016-04-25 DIAGNOSIS — M545 Low back pain: Secondary | ICD-10-CM

## 2016-04-25 DIAGNOSIS — G8929 Other chronic pain: Secondary | ICD-10-CM

## 2016-04-25 MED ORDER — OXYCODONE HCL 30 MG PO TABS: 30.0000 mg | ORAL_TABLET | Freq: Three times a day (TID) | ORAL | 0 refills | Status: DC | PRN

## 2016-04-25 NOTE — Progress Notes (Signed)
Subjective:    Patient ID: [name], female    DOB: [birth year], 50 y.o.   MRN: [id number]  HPI  55 year oldfe female who  has a past medical history of CHF (congestive heart failure) (HCC); Headache(784.0); History of chicken pox; Hypertension; Morbid obesity (HCC); and Osteoarthritis. She presents to the office today after being seen in the ER on 04/16/2016. She presented with sudden onset, constant, worsening left calf pain that began a couple of days ago. She states it started as a cramp and has now just become extremely sore with swelling. She states she has now had trouble ambulating due to pain. She has used ice and taken her home oxycodone without relief. No modifying factors. Denies chest pain or shortness of breath. She has no prior history of DVT  Ultrasound positive for DVT No evidence of renal insufficiency. Low risk bleeding.  Patient stable for outpatient anticoagulation with Xarelto. Follow up PCP.   Today in the office she reports that her leg pain has resolved and she is able to ambulate without much difficulty. She has been taking her medications as directed, has only missed one day.   She also needs a refill of her Oxycodone  Review of Systems  Constitutional: Negative.   Respiratory: Negative.   Cardiovascular: Negative.   Musculoskeletal: Positive for arthralgias, back pain, gait problem and joint swelling.  All other systems reviewed and are negative.  Past Medical History:  Diagnosis Date  . CHF (congestive heart failure) (HCC)   . Headache(784.0)   . History of chicken pox   . Hypertension   . Morbid obesity (HCC)   . Osteoarthritis     Social History   Social History  . Marital status: Divorced    Spouse name: N/A  . Number of children: N/A  . 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 Smoking status: Never Smoker  . Smokeless tobacco: Never Used  . Alcohol use No  . Drug use:  No  . Sexual activity: Not on file   Other Topics Concern  . Not on file   Social History Narrative   Currently unemployed    Has one daughter in [place]     Past Surgical History:  Procedure Laterality Date  . TUBAL LIGATION  1985    Family History  Problem Relation Age of Onset  . Arthritis Mother   . Hypertension Mother   . Prostate cancer Maternal Grandfather   . Breast cancer Maternal Grandmother     No Known Allergies  Current Outpatient Prescriptions on File Prior to Visit  Medication Sig Dispense Refill  . Amlodipine-Valsartan-HCTZ 10-320-25 MG TABS Take 1 tablet by mouth daily. 90 tablet 3  . atorvastatin (LIPITOR) 20 MG tablet Take 1 tablet (20 mg total) by mouth daily. 90 tablet 3  . carvedilol (COREG) 25 MG tablet Take 1 tablet (25 mg total) by mouth 2 (two) times daily with a meal. 180 tablet 3  . furosemide (LASIX) 40 MG tablet Take 1.5 tablets (60 mg total) by mouth daily. 135 tablet 3  . Rivaroxaban 15 & 20 MG TBPK Take as directed on package: Start with one 15mg  tablet by mouth twice a day with food. On Day 22, switch to one 20mg  tablet once a day with food. 51 each 0  . spironolactone (ALDACTONE) 25 MG tablet Take 1 tablet (25 mg total) by mouth daily. 90 tablet 3  No current facility-administered medications on file prior to visit.     BP (!) 158/92   Temp 98.1 F (36.7 C) (Oral)   Ht  (1.778 m)   Wt (!) 343 lb 1.6 oz (155.6 kg)   LMP 04/09/2016   BMI 49.23 kg/m       Objective:   Physical Exam  Constitutional: She is oriented to person, place, and time. She appears well-developed and well-nourished. No distress.  Cardiovascular: Normal rate, regular rhythm, normal heart sounds and intact distal pulses.  Exam reveals no gallop and no friction rub.   No murmur heard. Pulmonary/Chest: Effort normal and breath sounds normal. No respiratory distress. She has no wheezes. She has no rales. She exhibits no tenderness.  Musculoskeletal: Normal  range of motion. She exhibits no edema, tenderness or deformity.  Neurological: She is alert and oriented to person, place, and time.  Skin: Skin is warm and dry. No rash noted. She is not diaphoretic. No erythema. No pallor.  Psychiatric: She has a normal mood and affect. Her behavior is normal. Judgment and thought content normal.  Nursing note and vitals reviewed.     Assessment & Plan:  1. Chronic low back pain - oxycodone (ROXICODONE) 30 MG immediate release tablet; Take 1 tablet (30 mg total) by mouth every 8 (eight) hours as needed for pain.  Dispense: 90 tablet; Refill: 0  2. DVT (deep venous thrombosis), left - Continue with Anticoagulant therapy - We will likely due this for 6 months and then get repeats US - Stay ambulatory - Get out of car and stretch after driving for 4-13 minutes - Will follow up with this at her physical in acouple of weeks.   Shirline Frees, NP

## 2016-04-25 NOTE — Patient Instructions (Addendum)
It was great seeing you again   Continue with the [facility] as directed.   We will likely keep you on this for the next six months.   Take a break while driving after 65-99 minutes and get out of the car.   Please let me know if you need anything.    Deep Vein Thrombosis A deep vein thrombosis (DVT) is a blood clot (thrombus) that usually occurs in a deep, larger vein of the lower leg or the pelvis, or in an upper extremity such as the arm. These are dangerous and can lead to serious and even life-threatening complications if the clot travels to the lungs. A DVT can damage the valves in your leg veins so that instead of flowing upward, the blood pools in the lower leg. This is called post-thrombotic syndrome, and it can result in pain, swelling, discoloration, and sores on the leg. CAUSES A DVT is caused by the formation of a blood clot in your leg, pelvis, or arm. Usually, several things contribute to the formation of blood clots. A clot may develop when:  Your blood flow slows down.  Your vein becomes damaged in some way.  You have a condition that makes your blood clot more easily. RISK FACTORS A DVT is more likely to develop in:  People who are older, especially over 63 years of age.  People who are overweight (obese).  People who sit or lie still for a long time, such as during long-distance travel (over 4 hours), bed rest, hospitalization, or during recovery from certain medical conditions like a stroke.  People who do not engage in much physical activity (sedentary lifestyle).  People who have chronic breathing disorders.  People who have a personal or family history of blood clots or blood clotting disease.  People who have peripheral vascular disease (PVD), diabetes, or some types of cancer.  People who have heart disease, especially if the person had a recent heart attack or has congestive heart failure.  People who have neurological diseases that affect the legs (leg  paresis).  People who have had a traumatic injury, such as breaking a hip or leg.  People who have recently had major or lengthy surgery, especially on the hip, knee, or abdomen.  People who have had a central line placed inside a large vein.  People who take medicines that contain the hormone estrogen. These include birth control pills and hormone replacement therapy.  Pregnancy or during childbirth or the postpartum period.  Long plane flights (over 8 hours). SIGNS AND SYMPTOMS Symptoms of a DVT can include:   Swelling of your leg or arm, especially if one side is much worse.  Warmth and redness of your leg or arm, especially if one side is much worse.  Pain in your arm or leg. If the clot is in your leg, symptoms may be more noticeable or worse when you stand or walk.  A feeling of pins and needles, if the clot is in the arm. The symptoms of a DVT that has traveled to the lungs (pulmonary embolism, PE) usually start suddenly and include:  Shortness of breath while active or at rest.  Coughing or coughing up blood or blood-tinged mucus.  Chest pain that is often worse with deep breaths.  Rapid or irregular heartbeat.  Feeling light-headed or dizzy.  Fainting.  Feeling anxious.  Sweating. There may also be pain and swelling in a leg if that is where the blood clot started. These symptoms may represent  a serious problem that is an emergency. Do not wait to see if the symptoms will go away. Get medical help right away. Call your local emergency services (911 in the [place] Do not drive yourself to the hospital. DIAGNOSIS Your health care provider will take a medical history and perform a physical exam. You may also have other tests, including:  Blood tests to assess the clotting properties of your blood.  Imaging tests, such as CT, ultrasound, MRI, X-ray, and other tests to see if you have clots anywhere in your body. TREATMENT After a DVT is identified, it can be  treated. The type of treatment that you receive depends on many factors, such as the cause of your DVT, your risk for bleeding or developing more clots, and other medical conditions that you have. Sometimes, a combination of treatments is necessary. Treatment options may be combined and include:  Monitoring the blood clot with ultrasound.  Taking medicines by mouth, such as newer blood thinners (anticoagulants), thrombolytics, or warfarin.  Taking anticoagulant medicine by injection or through an IV tube.  Wearing compression stockings or using different types ofdevices.  Surgery (rare) to remove the blood clot or to place a filter in your abdomen to stop the blood clot from traveling to your lungs. Treatments for a DVT are often divided into immediate treatment and long-term treatment (up to 3 months after DVT). You can work with your health care provider to choose the treatment program that is best for you. HOME CARE INSTRUCTIONS If you are taking a newer oral anticoagulant:  Take the medicine every single day at the same time each day.  Understand what foods and drugs interact with this medicine.  Understand that there are no regular blood tests required when using this medicine.  Understand the side effects of this medicine, including excessive bruising or bleeding. Ask your health care provider or pharmacist about other possible side effects. If you are taking warfarin:  Understand how to take warfarin and know which foods can affect how warfarin works in [employer] account [employer].  Understand that it is dangerous to take too much or too little warfarin. Too much warfarin increases the risk of bleeding. Too little warfarin continues to allow the risk for blood clots.  Follow your PT and INR blood testing schedule. The PT and INR results allow your health care provider to adjust your dose of warfarin. It is very important that you have your PT and INR tested as often as told by your health care  provider.  Avoid major changes in your diet, or tell your health care provider before you change your diet. Arrange a visit with a registered dietitian to answer your questions. Many foods, especially foods that are high in vitamin K, can interfere with warfarin and affect the PT and INR results. Eat a consistent amount of foods that are high in vitamin K, such as:  Spinach, kale, broccoli, cabbage, collard greens, turnip greens, Brussels sprouts, peas, cauliflower, seaweed, and parsley.  Beef liver and pork liver.  Green tea.  Soybean oil.  Tell your health care provider about any and all medicines, vitamins, and supplements that you take, including aspirin and other over-the-counter anti-inflammatory medicines. Be especially cautious with aspirin and anti-inflammatory medicines. Do not take those before you ask your health care provider if it is safe to do so. This is important because many medicines can interfere with warfarin and affect the PT and INR results.  Do not start or stop taking any over-the-counter  or prescription medicine unless your health care provider or pharmacist tells you to do so. If you take warfarin, you will also need to do these things:  Hold pressure over cuts for longer than usual.  Tell your dentist and other health care providers that you are taking warfarin before you have any procedures in which bleeding may occur.  Avoid alcohol or drink very small amounts. Tell your health care provider if you change your alcohol intake.  Do not use tobacco products, including cigarettes, chewing tobacco, and e-cigarettes. If you need help quitting, ask your health care provider.  Avoid contact sports. General Instructions  Take over-the-counter and prescription medicines only as told by your health care provider. Anticoagulant medicines can have side effects, including easy bruising and difficulty stopping bleeding. If you are prescribed an anticoagulant, you will also  need to do these things:  Hold pressure over cuts for longer than usual.  Tell your dentist and other health care providers that you are taking anticoagulants before you have any procedures in which bleeding may occur.  Avoid contact sports.  Wear a medical alert bracelet or carry a medical alert card that says you have had a PE.  Ask your health care provider how soon you can go back to your normal activities. Stay active to prevent new blood clots from forming.  Make sure to exercise while traveling or when you have been sitting or standing for a long period of time. It is very important to exercise. Exercise your legs by walking or by tightening and relaxing your leg muscles often. Take frequent walks.  Wear compression stockings as told by your health care provider to help prevent more blood clots from forming.  Do not use tobacco products, including cigarettes, chewing tobacco, and e-cigarettes. If you need help quitting, ask your health care provider.  Keep all follow-up appointments with your health care provider. This is important. PREVENTION Take these actions to decrease your risk of developing another DVT:  Exercise regularly. For at least 30 minutes every day, engage in:  Activity that involves moving your arms and legs.  Activity that encourages good blood flow through your body by increasing your heart rate.  Exercise your arms and legs every hour during long-distance travel (over 4 hours). Drink plenty of water and avoid drinking alcohol while traveling.  Avoid sitting or lying in bed for long periods of time without moving your legs.  Maintain a weight that is appropriate for your height. Ask your health care provider what weight is healthy for you.  If you are a woman who is over 36 years of age, avoid unnecessary use of medicines that contain estrogen. These include birth control pills.  Do not smoke, especially if you take estrogen medicines. If you need help  quitting, ask your health care provider. If you are hospitalized, prevention measures may include:  Early walking after surgery, as soon as your health care provider says that it is safe.  Receiving anticoagulants to prevent blood clots.If you cannot take anticoagulants, other options may be available, such as wearing compression stockings or using different types of devices. SEEK IMMEDIATE MEDICAL CARE IF:  You have new or increased pain, swelling, or redness in an arm or leg.  You have numbness or tingling in an arm or leg.  You have shortness of breath while active or at rest.  You have chest pain.  You have a rapid or irregular heartbeat.  You feel light-headed or dizzy.  You cough  up blood.  You notice blood in your vomit, bowel movement, or urine. These symptoms may represent a serious problem that is an emergency. Do not wait to see if the symptoms will go away. Get medical help right away. Call your local emergency services (911 in the [place] Do not drive yourself to the hospital.   This information is not intended to replace advice given to you by your health care provider. Make sure you discuss any questions you have with your health care provider.   Document Released: 09/11/2005 Document Revised: 06/02/2015 Document Reviewed: 01/06/2015 Elsevier Interactive Patient Education [employer].

## 2016-05-08 ENCOUNTER — Other Ambulatory Visit: Payer: Self-pay

## 2016-05-12 ENCOUNTER — Other Ambulatory Visit (INDEPENDENT_AMBULATORY_CARE_PROVIDER_SITE_OTHER): Payer: Self-pay

## 2016-05-12 DIAGNOSIS — Z0000 Encounter for general adult medical examination without abnormal findings: Secondary | ICD-10-CM

## 2016-05-12 LAB — CBC WITH DIFFERENTIAL/PLATELET
BASOS ABS: 0 10*3/uL (ref 0.0–0.1)
Basophils Relative: 0.6 % (ref 0.0–3.0)
Eosinophils Absolute: 0 10*3/uL (ref 0.0–0.7)
Eosinophils Relative: 0.7 % (ref 0.0–5.0)
HEMATOCRIT: 30.9 % — AB (ref 36.0–46.0)
HEMOGLOBIN: 10.4 g/dL — AB (ref 12.0–15.0)
LYMPHS PCT: 39.5 % (ref 12.0–46.0)
Lymphs Abs: 2.2 10*3/uL (ref 0.7–4.0)
MCHC: 33.7 g/dL (ref 30.0–36.0)
MCV: 86.5 fl (ref 78.0–100.0)
MONOS PCT: 10 % (ref 3.0–12.0)
Monocytes Absolute: 0.6 10*3/uL (ref 0.1–1.0)
Neutro Abs: 2.8 10*3/uL (ref 1.4–7.7)
Neutrophils Relative %: 49.2 % (ref 43.0–77.0)
Platelets: 205 10*3/uL (ref 150.0–400.0)
RBC: 3.57 Mil/uL — AB (ref 3.87–5.11)
RDW: 15.5 % (ref 11.5–15.5)
WBC: 5.6 10*3/uL (ref 4.0–10.5)

## 2016-05-12 LAB — TSH: TSH: 1.74 u[IU]/mL (ref 0.35–4.50)

## 2016-05-12 LAB — LIPID PANEL
CHOLESTEROL: 147 mg/dL (ref 0–200)
HDL: 55.3 mg/dL (ref >39.00)
LDL CALC: 81 mg/dL (ref 0–99)
NonHDL: 91.57
TRIGLYCERIDES: 53 mg/dL (ref 0.0–149.0)
Total CHOL/HDL Ratio: 3
VLDL: 10.6 mg/dL (ref 0.0–40.0)

## 2016-05-12 LAB — BASIC METABOLIC PANEL
BUN: 10 mg/dL (ref 6–23)
CALCIUM: 9.1 mg/dL (ref 8.4–10.5)
CO2: 28 meq/L (ref 19–32)
Chloride: 106 mEq/L (ref 96–112)
Creatinine, Ser: 0.77 mg/dL (ref 0.40–1.20)
GFR: 101.67 mL/min (ref >60.00)
Glucose, Bld: 99 mg/dL (ref 70–99)
Potassium: 4.1 mEq/L (ref 3.5–5.1)
SODIUM: 140 meq/L (ref 135–145)

## 2016-05-12 LAB — HEPATIC FUNCTION PANEL
ALT: 7 U/L (ref 0–35)
AST: 12 U/L (ref 0–37)
Albumin: 3.7 g/dL (ref 3.5–5.2)
Alkaline Phosphatase: 75 U/L (ref 39–117)
BILIRUBIN TOTAL: 0.4 mg/dL (ref 0.2–1.2)
Bilirubin, Direct: 0.1 mg/dL (ref 0.0–0.3)
TOTAL PROTEIN: 7 g/dL (ref 6.0–8.3)

## 2016-05-12 LAB — POC URINALSYSI DIPSTICK (AUTOMATED)
Bilirubin, UA: NEGATIVE
Glucose, UA: NEGATIVE
KETONES UA: NEGATIVE
LEUKOCYTES UA: NEGATIVE
Nitrite, UA: NEGATIVE
PH UA: 7
SPEC GRAV UA: 1.015
UROBILINOGEN UA: 0.2

## 2016-05-17 ENCOUNTER — Encounter: Payer: Self-pay | Admitting: Adult Health

## 2016-05-23 ENCOUNTER — Telehealth: Payer: Self-pay | Admitting: Adult Health

## 2016-05-23 NOTE — Telephone Encounter (Signed)
Ok to refill 

## 2016-05-23 NOTE — Telephone Encounter (Signed)
° ° °  Pt req refill   XARELTON  oxycodone (ROXICODONE) 30 MG immediate release tablet   She want to pick up the RX for both

## 2016-05-23 NOTE — Telephone Encounter (Signed)
Ok to refill Oxycodone for 30 days  [facility] RampXeralto can be refilled 90 + 1

## 2016-05-24 ENCOUNTER — Other Ambulatory Visit: Payer: Self-pay

## 2016-05-24 DIAGNOSIS — M545 Low back pain: Principal | ICD-10-CM

## 2016-05-24 DIAGNOSIS — G8929 Other chronic pain: Secondary | ICD-10-CM

## 2016-05-24 MED ORDER — RIVAROXABAN (XARELTO) VTE STARTER PACK (15 & 20 MG): ORAL_TABLET | ORAL | 1 refills | Status: DC

## 2016-05-24 MED ORDER — RIVAROXABAN 20 MG PO TABS: 20.0000 mg | ORAL_TABLET | Freq: Every day | ORAL | 1 refills | Status: DC

## 2016-05-24 MED ORDER — OXYCODONE HCL 30 MG PO TABS: 30.0000 mg | ORAL_TABLET | Freq: Three times a day (TID) | ORAL | 0 refills | Status: DC | PRN

## 2016-05-24 NOTE — Telephone Encounter (Signed)
Patient notified Rx is ready to be picked up.  

## 2016-06-08 ENCOUNTER — Ambulatory Visit (INDEPENDENT_AMBULATORY_CARE_PROVIDER_SITE_OTHER): Payer: Self-pay | Admitting: Adult Health

## 2016-06-08 ENCOUNTER — Emergency Department (HOSPITAL_COMMUNITY): Payer: Self-pay

## 2016-06-08 ENCOUNTER — Emergency Department (HOSPITAL_COMMUNITY)
Admission: EM | Admit: 2016-06-08 | Discharge: 2016-06-08 | Disposition: A | Discharge status: Home / Self Care | Payer: Self-pay | Attending: Emergency Medicine | Admitting: Emergency Medicine

## 2016-06-08 ENCOUNTER — Encounter: Payer: Self-pay | Admitting: Adult Health

## 2016-06-08 ENCOUNTER — Encounter (HOSPITAL_COMMUNITY): Payer: Self-pay | Admitting: *Deleted

## 2016-06-08 VITALS — BP 162/92 | HR 88 | Temp 98.6°F | Ht 70.0 in | Wt 362.6 lb

## 2016-06-08 DIAGNOSIS — R0602 Shortness of breath: Secondary | ICD-10-CM

## 2016-06-08 DIAGNOSIS — I5032 Chronic diastolic (congestive) heart failure: Secondary | ICD-10-CM | POA: Insufficient documentation

## 2016-06-08 DIAGNOSIS — Z0000 Encounter for general adult medical examination without abnormal findings: Secondary | ICD-10-CM

## 2016-06-08 DIAGNOSIS — I824Z2 Acute embolism and thrombosis of unspecified deep veins of left distal lower extremity: Secondary | ICD-10-CM | POA: Insufficient documentation

## 2016-06-08 DIAGNOSIS — Z79899 Other long term (current) drug therapy: Secondary | ICD-10-CM | POA: Insufficient documentation

## 2016-06-08 DIAGNOSIS — E785 Hyperlipidemia, unspecified: Secondary | ICD-10-CM

## 2016-06-08 DIAGNOSIS — I10 Essential (primary) hypertension: Secondary | ICD-10-CM

## 2016-06-08 DIAGNOSIS — I110 Hypertensive heart disease with heart failure: Secondary | ICD-10-CM | POA: Insufficient documentation

## 2016-06-08 DIAGNOSIS — R0600 Dyspnea, unspecified: Secondary | ICD-10-CM

## 2016-06-08 DIAGNOSIS — I82402 Acute embolism and thrombosis of unspecified deep veins of left lower extremity: Secondary | ICD-10-CM

## 2016-06-08 DIAGNOSIS — R0609 Other forms of dyspnea: Secondary | ICD-10-CM

## 2016-06-08 LAB — CBC
HEMATOCRIT: 34.4 % — AB (ref 36.0–46.0)
HEMOGLOBIN: 11.1 g/dL — AB (ref 12.0–15.0)
MCH: 28.5 pg (ref 26.0–34.0)
MCHC: 32.3 g/dL (ref 30.0–36.0)
MCV: 88.4 fL (ref 78.0–100.0)
Platelets: 260 10*3/uL (ref 150–400)
RBC: 3.89 MIL/uL (ref 3.87–5.11)
RDW: 14.5 % (ref 11.5–15.5)
WBC: 6.8 10*3/uL (ref 4.0–10.5)

## 2016-06-08 LAB — BASIC METABOLIC PANEL
ANION GAP: 6 (ref 5–15)
BUN: 8 mg/dL (ref 6–20)
CHLORIDE: 104 mmol/L (ref 101–111)
CO2: 27 mmol/L (ref 22–32)
Calcium: 9.4 mg/dL (ref 8.9–10.3)
Creatinine, Ser: 0.85 mg/dL (ref 0.44–1.00)
Glucose, Bld: 110 mg/dL — ABNORMAL HIGH (ref 65–99)
POTASSIUM: 4 mmol/L (ref 3.5–5.1)
SODIUM: 137 mmol/L (ref 135–145)

## 2016-06-08 LAB — I-STAT BETA HCG BLOOD, ED (MC, WL, AP ONLY): I-stat hCG, quantitative: <5 m[IU]/mL (ref <5)

## 2016-06-08 MED ORDER — APIXABAN 5 MG PO TABS: ORAL_TABLET | ORAL | 0 refills | Status: DC

## 2016-06-08 MED ORDER — FUROSEMIDE 40 MG PO TABS: 60.0000 mg | ORAL_TABLET | Freq: Every day | ORAL | 3 refills | Status: DC

## 2016-06-08 NOTE — ED Notes (Signed)
Pt stated that she did not want to walk. Pt stood up next to bed with pulse O2 and percentages were 99%-100%. Pt stated that "she felt fine". Informed Dr. Bebe ShaggyWickline.

## 2016-06-08 NOTE — Progress Notes (Signed)
Subjective:    Patient ID: [name], female    DOB: 06-Jan-1966, 50 y.o.   MRN: [id number]  HPI  Patient presents for yearly preventative medicine examination. She is a pleasant 50 year old AA female who  has a past medical history of CHF (congestive heart failure) (HCC); DVT (deep venous thrombosis) (HCC); Headache(784.0); History of chicken pox; Hypertension; Morbid obesity (HCC); and Osteoarthritis.  All immunizations and health maintenance protocols were reviewed with the patient and needed orders were placed.  Medication reconciliation,  past medical history, social history, problem list and allergies were reviewed in detail with the patient  Goals were established with regard to weight loss, exercise, and  diet in compliance with medications. She is staying active and dieting.   DVT - She was seen in the ER in July 2017 for left sided calf pain that had started two days prior to being seen  and diagnosed with a DVT in her left leg and started on Xeralto. She was unable to afford her most recent prescription due to the high price of $700. She has been without Xeralto for the last week. Two days ago she had some mild chest pain that she thought was acid reflux ( has not had any since that time) and then also started having SOB with exertion that has not resolved. She denies any shortness of breath with resting. Additionally this week she started to notice a " burning sensation under my skin" in her left leg along the shin bone, there is also tenderness with palpation to this area. She denies any pain in her calf, bruising or color change. There has been no redness or warmth. She does endorse increased swelling in her lower extremities but has been out of Lasix for close to a week as well.     Review of Systems  Constitutional: Negative.   HENT: Negative.   Eyes: Negative.   Respiratory: Positive for shortness of breath. Negative for apnea, cough and chest tightness.     Cardiovascular: Positive for chest pain and leg swelling. Negative for palpitations.  Gastrointestinal: Negative.   Endocrine: Negative.   Genitourinary: Negative.   Musculoskeletal: Positive for arthralgias, back pain and gait problem. Negative for myalgias.  Skin: Negative.   Allergic/Immunologic: Negative.   Neurological: Negative for dizziness, tremors, syncope, weakness, light-headedness, numbness and headaches.  Hematological: Negative.   Psychiatric/Behavioral: Negative.   All other systems reviewed and are negative.  Past Medical History:  Diagnosis Date  . CHF (congestive heart failure) (HCC)   . DVT (deep venous thrombosis) (HCC)   . Headache(784.0)   . History of chicken pox   . Hypertension   . Morbid obesity (HCC)   . Osteoarthritis     Social History   Social History  . Marital status: Divorced    Spouse name: N/A  . Number of children: N/A  . 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 Smoking status: Never Smoker  . Smokeless tobacco: Never Used  . Alcohol use No  . Drug use: No  . Sexual activity: Not on file   Other Topics Concern  . Not on file   Social History Narrative   Currently unemployed    Has one daughter in [place]     Past Surgical History:  Procedure Laterality Date  . TUBAL LIGATION  1985    Family History  Problem Relation Age of Onset  .  Arthritis Mother   . Hypertension Mother   . Prostate cancer Maternal Grandfather   . Breast cancer Maternal Grandmother     No Known Allergies  Current Outpatient Prescriptions on File Prior to Visit  Medication Sig Dispense Refill  . Amlodipine-Valsartan-HCTZ 10-320-25 MG TABS Take 1 tablet by mouth daily. 90 tablet 3  . atorvastatin (LIPITOR) 20 MG tablet Take 1 tablet (20 mg total) by mouth daily. 90 tablet 3  . carvedilol (COREG) 25 MG tablet Take 1 tablet (25 mg total) by mouth 2 (two) times daily with a meal.  180 tablet 3  . furosemide (LASIX) 40 MG tablet Take 1.5 tablets (60 mg total) by mouth daily. 135 tablet 3  . oxycodone (ROXICODONE) 30 MG immediate release tablet Take 1 tablet (30 mg total) by mouth every 8 (eight) hours as needed for pain. 90 tablet 0  . spironolactone (ALDACTONE) 25 MG tablet Take 1 tablet (25 mg total) by mouth daily. 90 tablet 3   No current facility-administered medications on file prior to visit.     BP (!) 162/92   Temp 98.6 F (37 C) (Oral)   Ht 5\' 10"  (1.778 m)   Wt (!) 362 lb 9.6 oz (164.5 kg)   BMI 52.03 kg/m       Objective:   Physical Exam  Constitutional: She is oriented to person, place, and time. She appears well-developed and well-nourished. No distress.  HENT:  Head: Normocephalic and atraumatic.  Right Ear: External ear normal.  Left Ear: External ear normal.  Nose: Nose normal.  Mouth/Throat: Oropharynx is clear and moist. No oropharyngeal exudate.  Eyes: Conjunctivae and EOM are normal. Pupils are equal, round, and reactive to light. Right eye exhibits no discharge. Left eye exhibits no discharge. No scleral icterus.  Neck: Normal range of motion. Neck supple. No JVD present. No tracheal deviation present. No thyromegaly present.  Cardiovascular: Normal rate, regular rhythm, normal heart sounds and intact distal pulses.  Exam reveals no gallop and no friction rub.   No murmur heard. Pulmonary/Chest: Effort normal and breath sounds normal. No stridor. No respiratory distress. She has no wheezes. She has no rales. She exhibits no tenderness.  Abdominal: Soft. Bowel sounds are normal. She exhibits no distension and no mass. There is no tenderness. There is no rebound.  Genitourinary:  Genitourinary Comments: Refused GYN and breast exam   Musculoskeletal: Normal range of motion. She exhibits edema and tenderness. She exhibits no deformity.  Significant lower extremity non pitting edema. She does have pain with palpation to left ankle and shin.  There is no bruising, redness or warmth.   Lymphadenopathy:    She has no cervical adenopathy.  Neurological: She is alert and oriented to person, place, and time.  Skin: Skin is warm and dry. She is not diaphoretic.  Psychiatric: She has a normal mood and affect. Her behavior is normal. Judgment and thought content normal.  Nursing note and vitals reviewed.     Assessment & Plan:  1. Routine general medical examination at a health care facility - Reviewed labs in detail with pt and all questions answered - Encouraged heart healthy diet, calorie counting and exercise as tolerated - Follow up in one year for CPE - She is due to colonoscopy but is unable to afford it at this time. She will let me know when she has insurance so that we can order this exam   2. Chronic diastolic heart failure (HCC) - furosemide (LASIX) 40 MG tablet; Take  1.5 tablets (60 mg total) by mouth daily.  Dispense: 135 tablet; Refill: 3  3. Hyperlipidemia - Controlled with lipitor  Lab Results  Component Value Date   CHOL 147 05/12/2016   HDL 55.30 05/12/2016   LDLCALC 81 05/12/2016   TRIG 53.0 05/12/2016   CHOLHDL 3 05/12/2016     4. Morbid obesity, unspecified obesity type (HCC) - Encouraged weight loss through heart healthy diet and exercise - May have to consider bariatric surgery in the future Wt Readings from Last 3 Encounters:  06/08/16 (!) 362 lb 9.6 oz (164.5 kg)  04/25/16 (!) 343 lb 1.6 oz (155.6 kg)  04/16/16 (!) 346 lb 8 oz (157.2 kg)   - Some of this weight gain is likely due to fluid retention   5. SOB (shortness of breath) - Unfortunately I cannot r/o PE or  DVT at this time since she has stopped Xeralto and her symptoms started 2-3 days after no longer taking this medication. She is going to need to be started on Coumadin and bridged with Lovenox. She needs imaging and further work-up. She was advised to go to the ER and is agreeable. She does not want to take an ambulance and will have  her daughter take her.  - Consulting civil engineer at [facility] notified of pt arrival.  - Possibly due to fluid overload and being out of Lasix.  - furosemide (LASIX) 40 MG tablet; Take 1.5 tablets (60 mg total) by mouth daily.  Dispense: 135 tablet; Refill: 3 - I would like her to follow up with me after being discharged  6. Essential hypertension BP Readings from Last 3 Encounters:  06/08/16 (!) 162/92  04/25/16 (!) 158/92  04/16/16 163/79   - furosemide (LASIX) 40 MG tablet; Take 1.5 tablets (60 mg total) by mouth daily.  Dispense: 135 tablet; Refill: 3 - Will likely alter medication regimen after she has been diuresed   Shirline Frees, NP

## 2016-06-08 NOTE — Patient Instructions (Signed)
  It was great seeing you today  Your labs are normal   I have refilled your Lasix.   I have to send you to the ER to make sure that you do not have a blood clot in your legs or lungs.   Please follow up with me after you are seen in the ER

## 2016-06-08 NOTE — Care Management (Signed)
Ed CM received consult concerning medication assistance with xarelto. Patient was dx last month with DVT and received starter pack of Xarelto. Patient presents to [facility] c/o CP after running out and not being able to afford the cost of $700. CM met with patient who reports receiving the 30 day trial card last month. Patient is currently uninsured. Discussed with Dr.Wickline Patient is not eligible for another 30 day free trail card.  Discussed possible switching patient to Eliquis she would qualify for 30 day free trial. Discussed this with patient as well.  Patient would be eligible for Patient Medication Assistance, patient instructed to follow up with PCP to obtain assistance with Patient Medication Assistance if patient is to continue on this anticoagulant. Patient verbalized understanding teach back done. Updated Dr. Christy Gentles on care transition plan. No further CM need identified

## 2016-06-08 NOTE — ED Provider Notes (Signed)
[facility]-EMER[facility]ENCY DEPT Provider Note   CSN: [id number] Arrival date & time: 06/08/16  1719     History   Chief Complaint Chief Complaint  Patient presents with  . Shortness of Breath  . Leg Pain    HPI [name] is a 50 y.o. female.  The history is provided by the patient.  Shortness of Breath  This is a new problem. The current episode started 2 days ago. The problem has not changed since onset.Associated symptoms include leg pain and leg swelling. Pertinent negatives include no fever and no chest pain. Precipitated by: walking.  Leg Pain   This is a new problem. The current episode started more than 1 week ago. The problem occurs daily. The pain is present in the left lower leg. The pain is moderate.  Patient with h/o CHF, h/o left LE DVT presents with increasing SOB over past 2-3 days as well as increasing pain in left LE She has known DVT in left leg that was diagnosed in July 2017 and was expected to be on xarelto for 6 months She has not taken xarelto for past month due to cost Over past several days she has dyspnea on exertion No active CP She reports chronic LE edema and reports increased pain and "burning" in left LE No weakness in the leg  Past Medical History:  Diagnosis Date  . CHF (congestive heart failure) (HCC)   . DVT (deep venous thrombosis) (HCC)   . Headache(784.0)   . History of chicken pox   . Hypertension   . Morbid obesity (HCC)   . Osteoarthritis     Patient Active Problem List   Diagnosis Date Noted  . SOB (shortness of breath) 02/17/2015  . Myalgia 07/24/2014  . Hyperlipidemia 03/11/2014  . Generalized anxiety disorder 01/14/2014  . Chronic diastolic heart failure (HCC) 10/17/2013  . Dyspnea 08/20/2013  . Hypertension 07/22/2012  . Osteoarthritis of both knees 07/22/2012  . Morbid obesity (HCC) 07/22/2012  . Left ankle pain 07/22/2012  . Right knee pain 07/22/2012  . Chronic low back pain 07/22/2012  . Polyuria 07/22/2012    Past  Surgical History:  Procedure Laterality Date  . TUBAL LIGATION  1985    OB History    No data available       Home Medications    Prior to Admission medications   Medication Sig Start Date End Date Taking? Authorizing Provider  Amlodipine-Valsartan-HCTZ 10-320-25 MG TABS Take 1 tablet by mouth daily. 11/03/15   Nelwyn Salisbury, MD  atorvastatin (LIPITOR) 20 MG tablet Take 1 tablet (20 mg total) by mouth daily. 11/03/15   Nelwyn Salisbury, MD  carvedilol (COREG) 25 MG tablet Take 1 tablet (25 mg total) by mouth 2 (two) times daily with a meal. 11/03/15   Nelwyn Salisbury, MD  furosemide (LASIX) 40 MG tablet Take 1.5 tablets (60 mg total) by mouth daily. 06/08/16   Shirline Frees, NP  oxycodone (ROXICODONE) 30 MG immediate release tablet Take 1 tablet (30 mg total) by mouth every 8 (eight) hours as needed for pain. 05/24/16   Shirline Frees, NP  spironolactone (ALDACTONE) 25 MG tablet Take 1 tablet (25 mg total) by mouth daily. 11/03/15   Nelwyn Salisbury, MD    Family History Family History  Problem Relation Age of Onset  . Arthritis Mother   . Hypertension Mother   . Prostate cancer Maternal Grandfather   . Breast cancer Maternal Grandmother     Social History Social History  Substance Use Topics  . Smoking status: Never Smoker  . Smokeless tobacco: Never Used  . Alcohol use No     Allergies   Review of patient's allergies indicates no known allergies.   Review of Systems Review of Systems  Constitutional: Negative for fever.  Respiratory: Positive for shortness of breath.   Cardiovascular: Positive for leg swelling. Negative for chest pain.  Gastrointestinal: Negative for blood in stool.  Musculoskeletal: Positive for arthralgias.  All other systems reviewed and are negative.    Physical Exam Updated Vital Signs BP 159/88 (BP Location: Right Arm)   Pulse 84   Temp 98.8 F (37.1 C) (Oral)   Resp 16   Ht 5\' 10"  (1.778 m)   Wt (!) 164.2 kg   LMP 06/04/2016   SpO2 99%   BMI  51.94 kg/m   Physical Exam  CONSTITUTIONAL: Well developed/well nourished HEAD: Normocephalic/atraumatic EYES: EOMI ENMT: Mucous membranes moist NECK: supple no meningeal signs SPINE/BACK:entire spine nontender CV: S1/S2 noted, no murmurs/rubs/gallops noted LUNGS: Lungs are clear to auscultation bilaterally, no apparent distress ABDOMEN: soft, nontender, no rebound or guarding, bowel sounds noted throughout abdomen NEURO: Pt is awake/alert/appropriate, moves all extremitiesx4.  No facial droop.   EXTREMITIES: pulses normal/equal, full ROM, chronic appearing/symmetric edema to bilateral LE.  She has tenderness to left calf.  No erythema.  No crepitus.  Distal pulses intact SKIN: warm, color normal PSYCH: no abnormalities of mood noted, alert and oriented to situation  ED Treatments / Results  Labs (all labs ordered are listed, but only abnormal results are displayed) Labs Reviewed  BASIC METABOLIC PANEL - Abnormal; Notable for the following:       Result Value   Glucose, Bld 110 (*)    All other components within normal limits  CBC - Abnormal; Notable for the following:    Hemoglobin 11.1 (*)    HCT 34.4 (*)    All other components within normal limits  I-STAT BETA HCG BLOOD, ED ([facility], [facility], [facility] ONLY)    EKG  EKG Interpretation  Date/Time:  Thursday June 08 2016 19:35:37 EDT Ventricular Rate:  87 PR Interval:    QRS Duration: 91 QT Interval:  380 QTC Calculation: 458 R Axis:   -8 Text Interpretation:  Sinus rhythm Non-specific ST-t changes artifact noted No significant change since last tracing Confirmed by Bebe ShaggyWICKLINE  MD, Esme Durkin ([id number]) on 06/08/2016 7:41:36 PM       Radiology Dg Chest 2 View  Result Date: 06/08/2016 CLINICAL DATA:  Shortness of breath, history CHF, hypertension, obesity EXAM: CHEST  2 VIEW COMPARISON:  02/13/2015 FINDINGS: Enlargement of cardiac silhouette with pulmonary vascular congestion. Mediastinal contours normal. Lungs clear. No acute  infiltrate, pleural effusion or pneumothorax. Calcification of the anterior longitudinal ligament of the thoracic spine compatible with diffuse idiopathic skeletal hyperostosis. IMPRESSION: Enlargement of cardiac silhouette with pulmonary vascular congestion. No acute abnormalities. Electronically Signed   By: Ulyses SouthwardMark  Boles M.D.   On: 06/08/2016 17:53    Procedures Procedures (including critical care time)  Medications Ordered in ED Medications - No data to display   Initial Impression / Assessment and Plan / ED Course  I have reviewed the triage vital signs and the nursing notes.  Pertinent labs & imaging results that were available during my care of the patient were reviewed by me and considered in my medical decision making (see chart for details).  Clinical Course    Pt has been noncompliant due to cost with xarelto Now having SOB and  increased LE pain She will need to re-start anticoagulants Pt sent by PCP (I reviewed her PCP notes)  8:14 PM Seen by case management Plan to STOP xarelto Start eliquis (10mg  BID x1 week, then 5mg  BID for 3 weeks per pharmacy) and had medication voucher for pharmacy Will need to see PCP who can help assist to get med assistance to afford this drug She feels well ambulating No signs of acute CHF She ambulated at baseline Will d/c home  No signs of acute CHF Low suspicion for acute PE and needs DVT therapy anyways I doubt ACS   Final Clinical Impressions(s) / ED Diagnoses   Final diagnoses:  Dyspnea on exertion  DVT (deep venous thrombosis), left    New Prescriptions New Prescriptions   APIXABAN (ELIQUIS) 5 MG TABS TABLET    2 tablets PO BID for one week Then 1 tablet PO BID for 3 weeks     Zadie Rhine, MD 06/08/16 2016

## 2016-06-08 NOTE — ED Triage Notes (Signed)
Pt sent here by her pcp for sob, per pt.  Pt was taking xarelto for blood clot to L leg.  However, when she went to refill her prescription 2 weeks ago it was too expensive to fill.  Yesterday she began feeling sob with exertion.  Hx of chf also.

## 2016-06-21 ENCOUNTER — Telehealth: Payer: Self-pay | Admitting: Adult Health

## 2016-06-21 ENCOUNTER — Other Ambulatory Visit: Payer: Self-pay | Admitting: Adult Health

## 2016-06-21 DIAGNOSIS — G8929 Other chronic pain: Secondary | ICD-10-CM

## 2016-06-21 DIAGNOSIS — M545 Low back pain: Principal | ICD-10-CM

## 2016-06-21 MED ORDER — OXYCODONE HCL 30 MG PO TABS: 30.0000 mg | ORAL_TABLET | Freq: Three times a day (TID) | ORAL | 0 refills | Status: DC | PRN

## 2016-06-21 NOTE — Telephone Encounter (Signed)
Ok to refill 

## 2016-06-21 NOTE — Telephone Encounter (Signed)
Pt request refill  °oxycodone (ROXICODONE) 30 MG immediate release tablet °

## 2016-07-18 ENCOUNTER — Telehealth: Payer: Self-pay | Admitting: Adult Health

## 2016-07-18 NOTE — Telephone Encounter (Signed)
Ok to refill for one month  

## 2016-07-18 NOTE — Telephone Encounter (Signed)
Pt needs new rx oxycodone °

## 2016-07-18 NOTE — Telephone Encounter (Signed)
OK to refill

## 2016-07-19 ENCOUNTER — Other Ambulatory Visit: Payer: Self-pay

## 2016-07-19 DIAGNOSIS — M545 Low back pain: Principal | ICD-10-CM

## 2016-07-19 DIAGNOSIS — G8929 Other chronic pain: Secondary | ICD-10-CM

## 2016-07-19 MED ORDER — OXYCODONE HCL 30 MG PO TABS: 30.0000 mg | ORAL_TABLET | Freq: Three times a day (TID) | ORAL | 0 refills | Status: DC | PRN

## 2016-07-19 NOTE — Telephone Encounter (Signed)
Rx printed and signed - patient notified Rx is ready for pick up.

## 2016-08-15 ENCOUNTER — Telehealth: Payer: Self-pay | Admitting: Adult Health

## 2016-08-15 ENCOUNTER — Other Ambulatory Visit: Payer: Self-pay

## 2016-08-15 DIAGNOSIS — M545 Low back pain: Principal | ICD-10-CM

## 2016-08-15 DIAGNOSIS — G8929 Other chronic pain: Secondary | ICD-10-CM

## 2016-08-15 MED ORDER — OXYCODONE HCL 30 MG PO TABS: 30.0000 mg | ORAL_TABLET | Freq: Three times a day (TID) | ORAL | 0 refills | Status: DC | PRN

## 2016-08-15 NOTE — Telephone Encounter (Signed)
Ok to refill 

## 2016-08-15 NOTE — Telephone Encounter (Signed)
Rx has been printed and signed. Patient notified that Rx is ready to be picked up.

## 2016-08-15 NOTE — Telephone Encounter (Signed)
Pt request refill  °oxycodone (ROXICODONE) 30 MG immediate release tablet °

## 2016-09-13 ENCOUNTER — Other Ambulatory Visit: Payer: Self-pay

## 2016-09-13 ENCOUNTER — Telehealth: Payer: Self-pay | Admitting: Adult Health

## 2016-09-13 DIAGNOSIS — G8929 Other chronic pain: Secondary | ICD-10-CM

## 2016-09-13 DIAGNOSIS — M545 Low back pain: Principal | ICD-10-CM

## 2016-09-13 MED ORDER — OXYCODONE HCL 30 MG PO TABS: 30.0000 mg | ORAL_TABLET | Freq: Three times a day (TID) | ORAL | 0 refills | Status: DC | PRN

## 2016-09-13 NOTE — Telephone Encounter (Signed)
Patient last seen 06/08/16 - Ok to refill?

## 2016-09-13 NOTE — Telephone Encounter (Signed)
Ok to refill. Please advise patient of new prescribing rules. She is going to have to go to pain management or orthopedics.

## 2016-09-13 NOTE — Telephone Encounter (Signed)
Pt request refill  °oxycodone (ROXICODONE) 30 MG immediate release tablet °

## 2016-09-13 NOTE — Telephone Encounter (Signed)
Rx printed and signed, and placed up front for pick up. Patient notified of new prescribing rules and would like referral for pain management.

## 2016-10-13 ENCOUNTER — Telehealth: Payer: Self-pay | Admitting: Adult Health

## 2016-10-13 NOTE — Telephone Encounter (Signed)
Pt would like to see if she can get a refill of oxycodone she has not heard anything from the pain management office and will be out of the medication on Sunday.

## 2016-10-17 ENCOUNTER — Other Ambulatory Visit: Payer: Self-pay

## 2016-10-17 DIAGNOSIS — M545 Low back pain: Principal | ICD-10-CM

## 2016-10-17 DIAGNOSIS — G8929 Other chronic pain: Secondary | ICD-10-CM

## 2016-10-17 MED ORDER — OXYCODONE HCL 30 MG PO TABS: 30.0000 mg | ORAL_TABLET | Freq: Three times a day (TID) | ORAL | 0 refills | Status: DC | PRN

## 2016-10-17 NOTE — Telephone Encounter (Signed)
Patient notified that rx is ready for pick up.  Appt scheduled in one month 11/17/16 for annual exam/followup. Thanks!

## 2016-10-17 NOTE — Telephone Encounter (Signed)
Please advise 

## 2016-10-17 NOTE — Telephone Encounter (Signed)
Ok to refill for one month   She will need to come in for an appointment monthly until she is seen by pain management.   I am only able to refill her pain medication for the next 90 days. I advise her to start calling pain clinics to see if they can get her in sooner.

## 2016-11-14 ENCOUNTER — Encounter: Payer: Self-pay | Admitting: Adult Health

## 2016-11-14 ENCOUNTER — Ambulatory Visit (INDEPENDENT_AMBULATORY_CARE_PROVIDER_SITE_OTHER): Payer: Self-pay | Admitting: Adult Health

## 2016-11-14 VITALS — BP 152/88 | Temp 98.5°F | Ht 70.0 in | Wt 359.0 lb

## 2016-11-14 DIAGNOSIS — M170 Bilateral primary osteoarthritis of knee: Secondary | ICD-10-CM

## 2016-11-14 DIAGNOSIS — I10 Essential (primary) hypertension: Secondary | ICD-10-CM

## 2016-11-14 DIAGNOSIS — G8929 Other chronic pain: Secondary | ICD-10-CM

## 2016-11-14 DIAGNOSIS — M545 Low back pain: Secondary | ICD-10-CM

## 2016-11-14 MED ORDER — AMLODIPINE-VALSARTAN-HCTZ 10-320-25 MG PO TABS
1.0000 | ORAL_TABLET | Freq: Every day | ORAL | 3 refills | Status: DC
Start: 2016-11-14 — End: 2017-11-13

## 2016-11-14 MED ORDER — OXYCODONE HCL 30 MG PO TABS: 30.0000 mg | ORAL_TABLET | Freq: Three times a day (TID) | ORAL | 0 refills | Status: DC | PRN

## 2016-11-14 MED ORDER — ATORVASTATIN CALCIUM 20 MG PO TABS: 20.0000 mg | ORAL_TABLET | Freq: Every day | ORAL | 3 refills | Status: DC

## 2016-11-14 MED ORDER — METOPROLOL TARTRATE 50 MG PO TABS: 50.0000 mg | ORAL_TABLET | Freq: Two times a day (BID) | ORAL | 3 refills | Status: DC

## 2016-11-14 MED ORDER — FUROSEMIDE 40 MG PO TABS: 60.0000 mg | ORAL_TABLET | Freq: Every day | ORAL | 3 refills | Status: DC

## 2016-11-14 MED ORDER — SPIRONOLACTONE 25 MG PO TABS: 25.0000 mg | ORAL_TABLET | Freq: Every day | ORAL | 3 refills | Status: DC

## 2016-11-14 NOTE — Patient Instructions (Signed)
It was great seeing you today!   I have refilled your medications.   Please follow up with me in one month for further evaluation of narcotics.   Start calling pain management centers

## 2016-11-14 NOTE — Progress Notes (Addendum)
Subjective:    Patient ID: [name], female    DOB: 1966-06-06, 51 y.o.   MRN: [id number]  HPI  51 year old female who presents to the office today for follow up regarding hypertension and long term use of narcotics for pain management.   She would like to do bariatric surgery but does not have the resources to do that now. She is hoping to start that process in June. Ideally she would like to stop taking narcotic pain medications. She is hoping that once she loses weight her bilateral knee pain will improve.   Blood pressure is not controlled on Tribenzor, Coreg, aldactone and lasix.     Review of Systems  Constitutional: Negative.   HENT: Negative.   Eyes: Negative.   Respiratory: Negative.   Cardiovascular: Negative.   Musculoskeletal: Positive for arthralgias, back pain, gait problem and joint swelling.  Skin: Negative.   Hematological: Negative.   Psychiatric/Behavioral: Negative.   All other systems reviewed and are negative.  Past Medical History:  Diagnosis Date  . CHF (congestive heart failure) (HCC)   . DVT (deep venous thrombosis) (HCC)   . Headache(784.0)   . History of chicken pox   . Hypertension   . Morbid obesity (HCC)   . Osteoarthritis     Social History   Social History  . Marital status: Divorced    Spouse name: N/A  . Number of children: N/A  . 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 Smoking status: Never Smoker  . Smokeless tobacco: Never Used  . Alcohol use No  . Drug use: No  . Sexual activity: Not on file   Other Topics Concern  . Not on file   Social History Narrative   Currently unemployed    Has one daughter in [place]     Past Surgical History:  Procedure Laterality Date  . TUBAL LIGATION  1985    Family History  Problem Relation Age of Onset  . Arthritis Mother   . Hypertension Mother   . Prostate cancer Maternal Grandfather   . Breast  cancer Maternal Grandmother     No Known Allergies  Current Outpatient Prescriptions on File Prior to Visit  Medication Sig Dispense Refill  . Amlodipine-Valsartan-HCTZ 10-320-25 MG TABS Take 1 tablet by mouth daily. 90 tablet 3  . apixaban (ELIQUIS) 5 MG TABS tablet 2 tablets PO BID for one week Then 1 tablet PO BID for 3 weeks 70 tablet 0  . atorvastatin (LIPITOR) 20 MG tablet Take 1 tablet (20 mg total) by mouth daily. 90 tablet 3  . carvedilol (COREG) 25 MG tablet Take 1 tablet (25 mg total) by mouth 2 (two) times daily with a meal. 180 tablet 3  . furosemide (LASIX) 40 MG tablet Take 1.5 tablets (60 mg total) by mouth daily. 135 tablet 3  . oxycodone (ROXICODONE) 30 MG immediate release tablet Take 1 tablet (30 mg total) by mouth every 8 (eight) hours as needed for pain. 90 tablet 0  . spironolactone (ALDACTONE) 25 MG tablet Take 1 tablet (25 mg total) by mouth daily. 90 tablet 3   No current facility-administered medications on file prior to visit.     There were no vitals taken for this visit.      Objective:   Physical Exam  Constitutional: She is oriented to person, place, and time. She appears well-developed and well-nourished. No distress.  Obese   HENT:  Head: Normocephalic and atraumatic.  Right Ear: External ear normal.  Left Ear: External ear normal.  Nose: Nose normal.  Mouth/Throat: Oropharynx is clear and moist. No oropharyngeal exudate.  Eyes: Conjunctivae and EOM are normal. Pupils are equal, round, and reactive to light. Right eye exhibits no discharge. Left eye exhibits no discharge. No scleral icterus.  Neck: Normal range of motion. Neck supple. No JVD present. No tracheal deviation present. No thyromegaly present.  Cardiovascular: Normal rate, regular rhythm, normal heart sounds and intact distal pulses.  Exam reveals no gallop and no friction rub.   No murmur heard. Pulmonary/Chest: Effort normal and breath sounds normal. No stridor. No respiratory  distress. She has no wheezes. She has no rales. She exhibits no tenderness.  Abdominal: Soft. Bowel sounds are normal. She exhibits no distension and no mass. There is no tenderness. There is no rebound and no guarding.  Musculoskeletal: Normal range of motion. She exhibits edema (bilateral lower extremities). She exhibits no tenderness or deformity.  Lymphadenopathy:    She has no cervical adenopathy.  Neurological: She is alert and oriented to person, place, and time. She has normal reflexes. She displays normal reflexes. No cranial nerve deficit. She exhibits normal muscle tone. Coordination normal.  Skin: Skin is warm and dry. No rash noted. She is not diaphoretic. No erythema. No pallor.  Psychiatric: She has a normal mood and affect. Her behavior is normal. Judgment and thought content normal.  Nursing note and vitals reviewed.      Assessment & Plan:  1. Essential hypertension - Not controlled BP: (!) 152/88 - D/c Coreg. Start on Metoprolol 50 mg BID  - Follow up in one month   - metoprolol (LOPRESSOR) 50 MG tablet; Take 1 tablet (50 mg total) by mouth 2 (two) times daily.  Dispense: 60 tablet; Refill: 3 - spironolactone (ALDACTONE) 25 MG tablet; Take 1 tablet (25 mg total) by mouth daily.  Dispense: 90 tablet; Refill: 3 - furosemide (LASIX) 40 MG tablet; Take 1.5 tablets (60 mg total) by mouth daily.  Dispense: 135 tablet; Refill: 3 - atorvastatin (LIPITOR) 20 MG tablet; Take 1 tablet (20 mg total) by mouth daily.  Dispense: 90 tablet; Refill: 3 - Amlodipine-Valsartan-HCTZ 10-320-25 MG TABS; Take 1 tablet by mouth daily.  Dispense: 90 tablet; Refill: 3  2. Chronic low back pain, unspecified back pain laterality, with sciatica presence unspecified - oxycodone (ROXICODONE) 30 MG immediate release tablet; Take 1 tablet (30 mg total) by mouth every 8 (eight) hours as needed for pain.  Dispense: 90 tablet; Refill: 0 - Follow up in one month  - List of local pain management specialists  given   3. Morbid obesity (HCC) - Start working on diet and exercise as tolerated. We will refer her to bariatric surgery when she is ready  4. Osteoarthritis of both knees, unspecified osteoarthritis type - oxycodone (ROXICODONE) 30 MG immediate release tablet; Take 1 tablet (30 mg total) by mouth every 8 (eight) hours as needed for pain.  Dispense: 90 tablet; Refill: 0 -follow up in one month   Shirline Frees, NP

## 2016-11-14 NOTE — Addendum Note (Signed)
Addended by: Nancy FetterNAFZIGER, Lyra Alaimo L on: 11/14/2016 07:41 AM   Modules accepted: Level of Service

## 2016-11-17 ENCOUNTER — Ambulatory Visit: Payer: Self-pay | Admitting: Adult Health

## 2016-12-12 ENCOUNTER — Encounter: Payer: Self-pay | Admitting: Adult Health

## 2016-12-12 ENCOUNTER — Ambulatory Visit (INDEPENDENT_AMBULATORY_CARE_PROVIDER_SITE_OTHER): Payer: Self-pay | Admitting: Adult Health

## 2016-12-12 VITALS — BP 144/68 | Temp 98.3°F | Ht 70.0 in | Wt 366.0 lb

## 2016-12-12 DIAGNOSIS — M545 Low back pain: Secondary | ICD-10-CM

## 2016-12-12 DIAGNOSIS — M170 Bilateral primary osteoarthritis of knee: Secondary | ICD-10-CM

## 2016-12-12 DIAGNOSIS — G8929 Other chronic pain: Secondary | ICD-10-CM

## 2016-12-12 DIAGNOSIS — I10 Essential (primary) hypertension: Secondary | ICD-10-CM

## 2016-12-12 MED ORDER — OXYCODONE HCL 30 MG PO TABS: 30.0000 mg | ORAL_TABLET | Freq: Three times a day (TID) | ORAL | 0 refills | Status: DC | PRN

## 2016-12-12 NOTE — Progress Notes (Signed)
Subjective:    Patient ID: [name], female    DOB: 03/26/1966, 51 y.o.   MRN: [id number]  HPI  51 year old female who presents to the office today for follow up regarding hypertension. During her last visit she was switched from Coreg to Metoprolol 50mg .   She reports that she " feels better" since starting Metoprolol. She has not had any episodes of dizziness or lightheadedness  BP Readings from Last 3 Encounters:  12/12/16 (!) 144/68  11/14/16 (!) 152/88  06/08/16 (!) 164/102   She has been searching for pain management clinics and needs a referral to some of them. She is going to fax me the list of clinics that needs a referral    Review of Systems  Constitutional: Negative.   Respiratory: Negative.   Cardiovascular: Negative.   Musculoskeletal: Positive for arthralgias, back pain and gait problem.  All other systems reviewed and are negative.  Past Medical History:  Diagnosis Date  . CHF (congestive heart failure) (HCC)   . DVT (deep venous thrombosis) (HCC)   . Headache(784.0)   . History of chicken pox   . Hypertension   . Morbid obesity (HCC)   . Osteoarthritis     Social History   Social History  . Marital status: Divorced    Spouse name: N/A  . Number of children: N/A  . 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 Smoking status: Never Smoker  . Smokeless tobacco: Never Used  . Alcohol use No  . Drug use: No  . Sexual activity: Not on file   Other Topics Concern  . Not on file   Social History Narrative   Currently unemployed    Has one daughter in [place]     Past Surgical History:  Procedure Laterality Date  . TUBAL LIGATION  1985    Family History  Problem Relation Age of Onset  . Arthritis Mother   . Hypertension Mother   . Prostate cancer Maternal Grandfather   . Breast cancer Maternal Grandmother     No Known Allergies  Current Outpatient Prescriptions  on File Prior to Visit  Medication Sig Dispense Refill  . Amlodipine-Valsartan-HCTZ 10-320-25 MG TABS Take 1 tablet by mouth daily. 90 tablet 3  . apixaban (ELIQUIS) 5 MG TABS tablet 2 tablets PO BID for one week Then 1 tablet PO BID for 3 weeks 70 tablet 0  . atorvastatin (LIPITOR) 20 MG tablet Take 1 tablet (20 mg total) by mouth daily. 90 tablet 3  . furosemide (LASIX) 40 MG tablet Take 1.5 tablets (60 mg total) by mouth daily. 135 tablet 3  . metoprolol (LOPRESSOR) 50 MG tablet Take 1 tablet (50 mg total) by mouth 2 (two) times daily. 60 tablet 3  . spironolactone (ALDACTONE) 25 MG tablet Take 1 tablet (25 mg total) by mouth daily. 90 tablet 3   No current facility-administered medications on file prior to visit.     BP (!) 144/68 (BP Location: Left Arm, Patient Position: Sitting, Cuff Size: Large)   Temp 98.3 F (36.8 C) (Oral)   Ht 5\' 10"  (1.778 m)   Wt (!) 366 lb (166 kg)   BMI 52.52 kg/m       Objective:   Physical Exam  Constitutional: She is oriented to person, place, and time. She appears well-developed and well-nourished. No distress.  Eyes: Conjunctivae and EOM are normal. Pupils are equal,  round, and reactive to light. Right eye exhibits no discharge. Left eye exhibits no discharge. No scleral icterus.  Neck: Normal range of motion. Neck supple. No thyromegaly present.  Cardiovascular: Normal rate, regular rhythm, normal heart sounds and intact distal pulses.  Exam reveals no gallop and no friction rub.   No murmur heard. Pulmonary/Chest: Effort normal and breath sounds normal. No respiratory distress. She has no wheezes. She has no rales. She exhibits no tenderness.  Lymphadenopathy:    She has no cervical adenopathy.  Neurological: She is alert and oriented to person, place, and time.  Skin: Skin is warm and dry. No rash noted. She is not diaphoretic. No erythema. No pallor.  Psychiatric: She has a normal mood and affect. Her behavior is normal. Judgment and thought  content normal.  Nursing note and vitals reviewed.     Assessment & Plan:  1. Essential hypertension - Better controlled.  - I am going to keep her at this dose for the next 2-3 months    2. Chronic low back pain, unspecified back pain laterality, with sciatica presence unspecified - Pain contract signed.  - will get UDS at this visit - oxycodone (ROXICODONE) 30 MG immediate release tablet; Take 1 tablet (30 mg total) by mouth every 8 (eight) hours as needed for pain.  Dispense: 90 tablet; Refill: 0 - Will refer to pain management   3. Osteoarthritis of both knees, unspecified osteoarthritis type  - oxycodone (ROXICODONE) 30 MG immediate release tablet; Take 1 tablet (30 mg total) by mouth every 8 (eight) hours as needed for pain.  Dispense: 90 tablet; Refill: 0   Shirline Freesory Timika Muench, NP

## 2016-12-13 ENCOUNTER — Ambulatory Visit: Payer: Self-pay | Admitting: Adult Health

## 2017-01-09 ENCOUNTER — Encounter: Payer: Self-pay | Admitting: Adult Health

## 2017-01-09 ENCOUNTER — Ambulatory Visit: Payer: Self-pay | Admitting: Adult Health

## 2017-01-09 ENCOUNTER — Ambulatory Visit (INDEPENDENT_AMBULATORY_CARE_PROVIDER_SITE_OTHER): Payer: Self-pay | Admitting: Adult Health

## 2017-01-09 DIAGNOSIS — M170 Bilateral primary osteoarthritis of knee: Secondary | ICD-10-CM

## 2017-01-09 DIAGNOSIS — I10 Essential (primary) hypertension: Secondary | ICD-10-CM

## 2017-01-09 DIAGNOSIS — M545 Low back pain: Secondary | ICD-10-CM

## 2017-01-09 DIAGNOSIS — G8929 Other chronic pain: Secondary | ICD-10-CM

## 2017-01-09 MED ORDER — METOPROLOL TARTRATE 100 MG PO TABS: 100.0000 mg | ORAL_TABLET | Freq: Two times a day (BID) | ORAL | 0 refills | Status: DC

## 2017-01-09 MED ORDER — OXYCODONE HCL 30 MG PO TABS: 30.0000 mg | ORAL_TABLET | Freq: Three times a day (TID) | ORAL | 0 refills | Status: DC | PRN

## 2017-01-09 NOTE — Progress Notes (Signed)
Pre visit review using our clinic review tool, if applicable. No additional management support is needed unless otherwise documented below in the visit note. 

## 2017-01-09 NOTE — Progress Notes (Signed)
Subjective:    Patient ID: [name], female    DOB: 1966-08-10, 51 y.o.   MRN: [id number]  HPI  51 year old female who presents to the office today for one month follow up regarding hypertension and for pain management. She reports that she is taking her medication as directed. Unfortunately, her blood pressure has not gone down and has actually worsened. She has not been staying as active as she would like due to chronic pain.   She also reports that she has spoken to [name] and they need a referral and office notes from me for her to possibly join their pain clinic.   Review of Systems  Past Medical History:  Diagnosis Date  . CHF (congestive heart failure) (HCC)   . DVT (deep venous thrombosis) (HCC)   . Headache(784.0)   . History of chicken pox   . Hypertension   . Morbid obesity (HCC)   . Osteoarthritis     Social History   Social History  . Marital status: Divorced    Spouse name: N/A  . Number of children: N/A  . Years of education: N/A   Occupational History  . [name]   Social History Main Topics  . Smoking status: Never Smoker  . Smokeless tobacco: Never Used  . Alcohol use No  . Drug use: No  . Sexual activity: Not on file   Other Topics Concern  . Not on file   Social History Narrative   Currently unemployed    Has one daughter in [name]     Past Surgical History:  Procedure Laterality Date  . TUBAL LIGATION  1985    Family History  Problem Relation Age of Onset  . Arthritis Mother   . Hypertension Mother   . Prostate cancer Maternal Grandfather   . Breast cancer Maternal Grandmother     No Known Allergies  Current Outpatient Prescriptions on File Prior to Visit  Medication Sig Dispense Refill  . Amlodipine-Valsartan-HCTZ 10-320-25 MG TABS Take 1 tablet by mouth daily. 90 tablet 3  . apixaban (ELIQUIS) 5 MG TABS tablet 2 tablets PO BID for one week Then 1 tablet PO BID for 3 weeks 70  tablet 0  . atorvastatin (LIPITOR) 20 MG tablet Take 1 tablet (20 mg total) by mouth daily. 90 tablet 3  . furosemide (LASIX) 40 MG tablet Take 1.5 tablets (60 mg total) by mouth daily. 135 tablet 3  . metoprolol (LOPRESSOR) 50 MG tablet Take 1 tablet (50 mg total) by mouth 2 (two) times daily. 60 tablet 3  . oxycodone (ROXICODONE) 30 MG immediate release tablet Take 1 tablet (30 mg total) by mouth every 8 (eight) hours as needed for pain. 90 tablet 0  . spironolactone (ALDACTONE) 25 MG tablet Take 1 tablet (25 mg total) by mouth daily. 90 tablet 3   No current facility-administered medications on file prior to visit.     BP (!) 160/110 (BP Location: Left Arm, Patient Position: Sitting, Cuff Size: Large)   Pulse 98   Temp 97.8 F (36.6 C) (Oral)   Ht  (1.778 m)   Wt (!) 364 lb 11.2 oz (165.4 kg)   SpO2 96%   BMI 52.33 kg/m      Objective:   Physical Exam  Constitutional: She is oriented to person, place, and time. She appears well-developed and well-nourished. No distress.  Cardiovascular: Normal rate, regular rhythm, normal heart sounds and intact distal pulses.  Exam reveals no gallop and no friction rub.   No murmur heard. Pulmonary/Chest: Effort normal and breath sounds normal. No respiratory distress. She has no wheezes. She has no rales. She exhibits no tenderness.  Neurological: She is alert and oriented to person, place, and time.  Skin: Skin is warm and dry. No rash noted. She is not diaphoretic. No erythema. No pallor.  Psychiatric: She has a normal mood and affect. Her behavior is normal. Judgment and thought content normal.  Nursing note and vitals reviewed.     Assessment & Plan:  1. Essential hypertension - Not not at goal. Will increase Metoprolol form 50 to  BID  - metoprolol (LOPRESSOR) 100 MG tablet; Take 1 tablet (100 mg total) by mouth 2 (two) times daily.  Dispense: 180 tablet; Refill: 0 - Follow up in one month   2. Chronic low back pain,  unspecified back pain laterality, with sciatica presence unspecified  - oxycodone (ROXICODONE) 30 MG immediate release tablet; Take 1 tablet (30 mg total) by mouth every 8 (eight) hours as needed for pain.  Dispense: 90 tablet; Refill: 0 - Ambulatory referral to Pain Clinic - Follow up in one month   3. Osteoarthritis of both knees, unspecified osteoarthritis type  - oxycodone (ROXICODONE) 30 MG immediate release tablet; Take 1 tablet (30 mg total) by mouth every 8 (eight) hours as needed for pain.  Dispense: 90 tablet; Refill: 0   Shirline Frees, NP

## 2017-01-11 ENCOUNTER — Encounter: Payer: Self-pay | Admitting: Adult Health

## 2017-02-01 ENCOUNTER — Ambulatory Visit (INDEPENDENT_AMBULATORY_CARE_PROVIDER_SITE_OTHER): Payer: Self-pay | Admitting: Adult Health

## 2017-02-01 ENCOUNTER — Encounter: Payer: Self-pay | Admitting: Adult Health

## 2017-02-01 DIAGNOSIS — I10 Essential (primary) hypertension: Secondary | ICD-10-CM

## 2017-02-01 DIAGNOSIS — M545 Low back pain: Secondary | ICD-10-CM

## 2017-02-01 DIAGNOSIS — G8929 Other chronic pain: Secondary | ICD-10-CM

## 2017-02-01 DIAGNOSIS — M170 Bilateral primary osteoarthritis of knee: Secondary | ICD-10-CM

## 2017-02-01 MED ORDER — OXYCODONE HCL 30 MG PO TABS: 30.0000 mg | ORAL_TABLET | Freq: Three times a day (TID) | ORAL | 0 refills | Status: DC | PRN

## 2017-02-01 NOTE — Progress Notes (Signed)
Subjective:    Patient ID: [name], female    DOB: May 04, 1966, 51 y.o.   MRN: [id number]  HPI  51 year old female who presents to the office today for follow up regarding hypertension. During her last visit her blood pressure was not controlled at 160/100. Metoprolol was increased from 50 mg BID  to 100mg  BID.   She is not checking her blood pressure at home.   She does report that she is eating healthier and is no longer eating pork.   BP Readings from Last 3 Encounters:  02/01/17 (!) 150/98  01/09/17 (!) 160/110  12/12/16 (!) 144/68   She needs a refill of Oxycodone 30 mg ER  Review of Systems See HPI   Past Medical History:  Diagnosis Date  . CHF (congestive heart failure) (HCC)   . DVT (deep venous thrombosis) (HCC)   . Headache(784.0)   . History of chicken pox   . Hypertension   . Morbid obesity (HCC)   . Osteoarthritis     Social History   Social History  . Marital status: Divorced    Spouse name: N/A  . Number of children: N/A  . 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 Smoking status: Never Smoker  . Smokeless tobacco: Never Used  . Alcohol use No  . Drug use: No  . Sexual activity: Not on file   Other Topics Concern  . Not on file   Social History Narrative   Currently unemployed    Has one daughter in [place]     Past Surgical History:  Procedure Laterality Date  . TUBAL LIGATION  1985    Family History  Problem Relation Age of Onset  . Arthritis Mother   . Hypertension Mother   . Prostate cancer Maternal Grandfather   . Breast cancer Maternal Grandmother     No Known Allergies  Current Outpatient Prescriptions on File Prior to Visit  Medication Sig Dispense Refill  . Amlodipine-Valsartan-HCTZ 10-320-25 MG TABS Take 1 tablet by mouth daily. 90 tablet 3  . apixaban (ELIQUIS) 5 MG TABS tablet 2 tablets PO BID for one week Then 1 tablet PO BID for 3 weeks 70  tablet 0  . atorvastatin (LIPITOR) 20 MG tablet Take 1 tablet (20 mg total) by mouth daily. 90 tablet 3  . furosemide (LASIX) 40 MG tablet Take 1.5 tablets (60 mg total) by mouth daily. 135 tablet 3  . metoprolol (LOPRESSOR) 100 MG tablet Take 1 tablet (100 mg total) by mouth 2 (two) times daily. 180 tablet 0  . oxycodone (ROXICODONE) 30 MG immediate release tablet Take 1 tablet (30 mg total) by mouth every 8 (eight) hours as needed for pain. 90 tablet 0  . spironolactone (ALDACTONE) 25 MG tablet Take 1 tablet (25 mg total) by mouth daily. 90 tablet 3   No current facility-administered medications on file prior to visit.     BP (!) 150/98 (BP Location: Left Arm, Patient Position: Sitting, Cuff Size: Normal)   Temp 98.1 F (36.7 C) (Oral)   Ht 5\' 10"  (1.778 m)   Wt (!) 368 lb 6.4 oz (167.1 kg)   BMI 52.86 kg/m       Objective:   Physical Exam  Constitutional: She is oriented to person, place, and time.  Cardiovascular: Normal rate, regular rhythm, normal heart sounds and intact distal pulses.  Exam reveals no gallop and no  friction rub.   No murmur heard. Pulmonary/Chest: Effort normal and breath sounds normal. No respiratory distress. She has no wheezes. She has no rales. She exhibits no tenderness.  Musculoskeletal: She exhibits edema and tenderness (bilateral knees, lower back, and right shoulder ).  Neurological: She is alert and oriented to person, place, and time.  Skin: Skin is warm and dry. No rash noted. No erythema. No pallor.  Psychiatric: She has a normal mood and affect. Her behavior is normal. Judgment and thought content normal.  Nursing note and vitals reviewed.     Assessment & Plan:  1. Essential hypertension - I am going to have her increasing Metoprolol from 100mg  to 150 mg BID.  - Follow up in one month or sooner if needed  2. Chronic low back pain, unspecified back pain laterality, with sciatica presence unspecified  - oxycodone (ROXICODONE) 30 MG  immediate release tablet; Take 1 tablet (30 mg total) by mouth every 8 (eight) hours as needed for pain.  Dispense: 90 tablet; Refill: 0 - Have reviewed [place] Controlled Substance Database. Her prescription history has no discrepancies.   3. Osteoarthritis of both knees, unspecified osteoarthritis type  - oxycodone (ROXICODONE) 30 MG immediate release tablet; Take 1 tablet (30 mg total) by mouth every 8 (eight) hours as needed for pain.  Dispense: 90 tablet; Refill: 0  Shirline Frees, NP

## 2017-02-01 NOTE — Patient Instructions (Signed)
Increase Metoprolol to 150mg  twice a day   Monitor blood pressure at home   Follow up with me in on month

## 2017-02-20 ENCOUNTER — Emergency Department (HOSPITAL_COMMUNITY): Payer: Self-pay

## 2017-02-20 ENCOUNTER — Emergency Department (HOSPITAL_COMMUNITY)
Admission: EM | Admit: 2017-02-20 | Discharge: 2017-02-20 | Disposition: A | Discharge status: Home / Self Care | Payer: Self-pay | Attending: Emergency Medicine | Admitting: Emergency Medicine

## 2017-02-20 ENCOUNTER — Encounter (HOSPITAL_COMMUNITY): Payer: Self-pay

## 2017-02-20 DIAGNOSIS — I110 Hypertensive heart disease with heart failure: Secondary | ICD-10-CM | POA: Insufficient documentation

## 2017-02-20 DIAGNOSIS — Z79899 Other long term (current) drug therapy: Secondary | ICD-10-CM | POA: Insufficient documentation

## 2017-02-20 DIAGNOSIS — R0600 Dyspnea, unspecified: Secondary | ICD-10-CM

## 2017-02-20 DIAGNOSIS — R0602 Shortness of breath: Secondary | ICD-10-CM | POA: Insufficient documentation

## 2017-02-20 DIAGNOSIS — I5032 Chronic diastolic (congestive) heart failure: Secondary | ICD-10-CM | POA: Insufficient documentation

## 2017-02-20 LAB — CBC
HCT: 34.8 % — ABNORMAL LOW (ref 36.0–46.0)
Hemoglobin: 11.2 g/dL — ABNORMAL LOW (ref 12.0–15.0)
MCH: 28.7 pg (ref 26.0–34.0)
MCHC: 32.2 g/dL (ref 30.0–36.0)
MCV: 89.2 fL (ref 78.0–100.0)
PLATELETS: 215 10*3/uL (ref 150–400)
RBC: 3.9 MIL/uL (ref 3.87–5.11)
RDW: 14.4 % (ref 11.5–15.5)
WBC: 5.2 10*3/uL (ref 4.0–10.5)

## 2017-02-20 LAB — BASIC METABOLIC PANEL
Anion gap: 8 (ref 5–15)
BUN: 7 mg/dL (ref 6–20)
CALCIUM: 8.9 mg/dL (ref 8.9–10.3)
CHLORIDE: 103 mmol/L (ref 101–111)
CO2: 25 mmol/L (ref 22–32)
CREATININE: 0.73 mg/dL (ref 0.44–1.00)
GFR calc Af Amer: >60 mL/min (ref >60)
GFR calc non Af Amer: >60 mL/min (ref >60)
GLUCOSE: 102 mg/dL — AB (ref 65–99)
Potassium: 3.7 mmol/L (ref 3.5–5.1)
Sodium: 136 mmol/L (ref 135–145)

## 2017-02-20 LAB — HEPATIC FUNCTION PANEL
ALBUMIN: 3.5 g/dL (ref 3.5–5.0)
ALK PHOS: 95 U/L (ref 38–126)
ALT: 11 U/L — ABNORMAL LOW (ref 14–54)
AST: 18 U/L (ref 15–41)
BILIRUBIN DIRECT: 0.1 mg/dL (ref 0.1–0.5)
Indirect Bilirubin: 0.7 mg/dL (ref 0.3–0.9)
Total Bilirubin: 0.8 mg/dL (ref 0.3–1.2)
Total Protein: 7.3 g/dL (ref 6.5–8.1)

## 2017-02-20 LAB — I-STAT TROPONIN, ED: TROPONIN I, POC: 0 ng/mL (ref 0.00–0.08)

## 2017-02-20 LAB — BRAIN NATRIURETIC PEPTIDE: B NATRIURETIC PEPTIDE 5: 53.5 pg/mL (ref 0.0–100.0)

## 2017-02-20 LAB — D-DIMER, QUANTITATIVE (NOT AT ARMC): D DIMER QUANT: 0.66 ug{FEU}/mL — AB (ref 0.00–0.50)

## 2017-02-20 MED ORDER — IOPAMIDOL (ISOVUE-370) INJECTION 76%
INTRAVENOUS | Status: AC
  Administered 2017-02-20: 100 mL
  Filled 2017-02-20: qty 100

## 2017-02-20 NOTE — ED Notes (Signed)
Patient ambulated to restroom with a steady gait

## 2017-02-20 NOTE — ED Provider Notes (Signed)
[facility]-EMERGENCY DEPT Provider Note   CSN: [id number] Arrival date & time: 02/20/17  1009     History   Chief Complaint Chief Complaint  Patient presents with  . Shortness of Breath    HPI [name] is a 51 y.o. female.  HPI Patient presents with shortness of breath. Has had it since Friday with today being Tuesday. History of CHF and history of DVT. States her weights are gone" a lot". States she did not quantify more neck. Denies chest pain. States she does have some chronic right shoulder pain. States the shortness of breath is worse with lying down. Has swelling in both her legs. States is not unusual for her. Her left leg is less swollen now than it has been recently but is still more swollen than the contralateral side. Had previous history of DVT. She stopped taking her anticoagulants. Has not had PE in the past. No fevers. No chills.   Past Medical History:  Diagnosis Date  . CHF (congestive heart failure) (HCC)   . DVT (deep venous thrombosis) (HCC)   . Headache(784.0)   . History of chicken pox   . Hypertension   . Morbid obesity (HCC)   . Osteoarthritis     Patient Active Problem List   Diagnosis Date Noted  . SOB (shortness of breath) 02/17/2015  . Myalgia 07/24/2014  . Hyperlipidemia 03/11/2014  . Generalized anxiety disorder 01/14/2014  . Chronic diastolic heart failure (HCC) 10/17/2013  . Dyspnea 08/20/2013  . Hypertension 07/22/2012  . Osteoarthritis of both knees 07/22/2012  . Morbid obesity (HCC) 07/22/2012  . Left ankle pain 07/22/2012  . Right knee pain 07/22/2012  . Chronic low back pain 07/22/2012  . Polyuria 07/22/2012    Past Surgical History:  Procedure Laterality Date  . TUBAL LIGATION  1985    OB History    No data available       Home Medications    Prior to Admission medications   Medication Sig Start Date End Date Taking? Authorizing Provider  acetaminophen (TYLENOL) 500 MG tablet Take 500 mg by mouth every 6 (six) hours  as needed for mild pain.   Yes [provider]  Amlodipine-Valsartan-HCTZ 13-244-0110-320-25 MG TABS Take 1 tablet by mouth daily. 11/14/16  Yes Nafziger, Kandee Keenory, NP  atorvastatin (LIPITOR) 20 MG tablet Take 1 tablet (20 mg total) by mouth daily. 11/14/16  Yes Nafziger, Kandee Keenory, NP  furosemide (LASIX) 40 MG tablet Take 1.5 tablets (60 mg total) by mouth daily. 11/14/16  Yes Nafziger, Kandee Keenory, NP  metoprolol (LOPRESSOR) 100 MG tablet Take 1 tablet (100 mg total) by mouth 2 (two) times daily. 01/09/17 04/09/17 Yes Nafziger, Kandee Keenory, NP  oxycodone (ROXICODONE) 30 MG immediate release tablet Take 1 tablet (30 mg total) by mouth every 8 (eight) hours as needed for pain. 02/01/17  Yes Nafziger, Kandee Keenory, NP  spironolactone (ALDACTONE) 25 MG tablet Take 1 tablet (25 mg total) by mouth daily. 11/14/16  Yes Nafziger, Kandee Keenory, NP  apixaban (ELIQUIS) 5 MG TABS tablet 2 tablets PO BID for one week Then 1 tablet PO BID for 3 weeks Patient not taking: Reported on 02/20/2017 06/08/16   Zadie RhineWickline, Donald, MD    Family History Family History  Problem Relation Age of Onset  . Arthritis Mother   . Hypertension Mother   . Prostate cancer Maternal Grandfather   . Breast cancer Maternal Grandmother     Social History Social History  Substance Use Topics  . Smoking status: Never Smoker  . Smokeless  tobacco: Never Used  . Alcohol use No     Allergies   Patient has no known allergies.   Review of Systems Review of Systems  Constitutional: Positive for fatigue. Negative for appetite change and fever.  HENT: Negative for congestion.   Respiratory: Positive for shortness of breath.        Positive orthopnea  Cardiovascular: Positive for leg swelling. Negative for chest pain.  Gastrointestinal: Negative for abdominal pain.  Genitourinary: Negative for dysuria.  Musculoskeletal: Negative for back pain.  Skin: Negative for wound.  Neurological: Negative for syncope.  Hematological: Negative for adenopathy.    Psychiatric/Behavioral: Negative for confusion.     Physical Exam Updated Vital Signs BP (!) 178/106   Pulse 78   Temp 98.8 F (37.1 C) (Oral)   Resp 15   Ht 5\' 10"  (1.778 m)   Wt (!) 170.1 kg (375 lb)   LMP 02/20/2017   SpO2 99%   BMI 53.81 kg/m   Physical Exam  Constitutional: She appears well-developed.  Patient is morbidly obese  HENT:  Head: Atraumatic.  Eyes: EOM are normal.  Neck: Neck supple. No JVD present.  Cardiovascular: Normal rate.   Pulmonary/Chest: No respiratory distress. She has no rales.  Abdominal: Soft. There is no tenderness.  Musculoskeletal: She exhibits edema.  Moderate pitting edema bilateral lower legs. Worse on left side compared to the right.  Skin: Skin is warm. Capillary refill takes less than 2 seconds.  Psychiatric: She has a normal mood and affect.     ED Treatments / Results  Labs (all labs ordered are listed, but only abnormal results are displayed) Labs Reviewed  BASIC METABOLIC PANEL - Abnormal; Notable for the following:       Result Value   Glucose, Bld 102 (*)    All other components within normal limits  CBC - Abnormal; Notable for the following:    Hemoglobin 11.2 (*)    HCT 34.8 (*)    All other components within normal limits  HEPATIC FUNCTION PANEL - Abnormal; Notable for the following:    ALT 11 (*)    All other components within normal limits  D-DIMER, QUANTITATIVE (NOT AT [facility]) - Abnormal; Notable for the following:    D-Dimer, Quant 0.66 (*)    All other components within normal limits  BRAIN NATRIURETIC PEPTIDE  I-STAT TROPOININ, ED    EKG  EKG Interpretation  Date/Time:  Tuesday Feb 20 2017 10:16:26 EDT Ventricular Rate:  98 PR Interval:  142 QRS Duration: 86 QT Interval:  372 QTC Calculation: 474 R Axis:   8 Text Interpretation:  Normal sinus rhythm Moderate voltage criteria for LVH, may be normal variant Nonspecific ST abnormality Abnormal ECG Confirmed by Rubin Payor  MD, Himmat Enberg [id number]) on  02/20/2017 10:41:42 AM       Radiology Dg Chest 2 View  Result Date: 02/20/2017 CLINICAL DATA:  Progressive shortness of breath. EXAM: CHEST  2 VIEW COMPARISON:  06/08/2016 FINDINGS: Chronic mild cardiomegaly. The pulmonary vascularity is normal and the lungs are clear. No effusions. No acute bone abnormality. IMPRESSION: Chronic mild cardiomegaly.  No acute abnormalities. Electronically Signed   By: Francene Boyers M.D.   On: 02/20/2017 11:08   Ct Angio Chest Pe W Or Wo Contrast  Result Date: 02/20/2017 CLINICAL DATA:  Short of breath for 4 days EXAM: CT ANGIOGRAPHY CHEST WITH CONTRAST TECHNIQUE: Multidetector CT imaging of the chest was performed using the standard protocol during bolus administration of intravenous contrast. Multiplanar CT image reconstructions  and MIPs were obtained to evaluate the vascular anatomy. CONTRAST:  100 cc Isovue 370 COMPARISON:  02/13/2015 FINDINGS: Cardiovascular: There are no filling defects in the pulmonary arterial tree to suggest acute pulmonary thromboembolism. There is no evidence of aortic dissection or aneurysm. Minimal coronary artery calcification in the proximal LAD. Mediastinum/Nodes: No abnormal mediastinal adenopathy. Thyroid is unremarkable. Lungs/Pleura: No pneumothorax or pleural effusion. Lungs are under aerated and grossly clear. Upper Abdomen: Small hiatal hernia is suspected. Musculoskeletal: No vertebral compression deformity or evidence of acute rib fracture Review of the MIP images confirms the above findings. IMPRESSION: No evidence of acute pulmonary thromboembolism. Small hiatal hernia suspected. Electronically Signed   By: Jolaine Click M.D.   On: 02/20/2017 13:00    Procedures Procedures (including critical care time)  Medications Ordered in ED Medications  iopamidol (ISOVUE-370) 76 % injection (100 mLs  Contrast Given 02/20/17 1242)     Initial Impression / Assessment and Plan / ED Course  I have reviewed the triage vital signs and  the nursing notes.  Pertinent labs & imaging results that were available during my care of the patient were reviewed by me and considered in my medical decision making (see chart for details).     Patient was shortness of breath. Worried that she has CHF. X-ray and BNP are reassuring. D-dimer done and is negative. Doubt pulmonary was. Has swelling or legs but that has improved. Troponin negative. Will discharge home follow-up with her primary care doctor. She was not hypoxic with ambulation. Final Clinical Impressions(s) / ED Diagnoses   Final diagnoses:  SOB (shortness of breath)  Dyspnea, unspecified type    New Prescriptions New Prescriptions   No medications on file     Benjiman Core, MD 02/20/17 1510

## 2017-02-20 NOTE — ED Notes (Signed)
Patient transported to CT 

## 2017-02-20 NOTE — ED Triage Notes (Signed)
Per Pt, Pt is coming from home with complaints of SOB and right sided chest pain that started a couple days ago. Reports worsens at night while truing to sleeping. Denies N/V/D. Swelling noted to her left leg that has recently subsided.

## 2017-02-20 NOTE — ED Notes (Signed)
Patient to xray.

## 2017-02-20 NOTE — ED Notes (Signed)
Patient to CT.

## 2017-03-07 ENCOUNTER — Ambulatory Visit (INDEPENDENT_AMBULATORY_CARE_PROVIDER_SITE_OTHER): Payer: Self-pay | Admitting: Adult Health

## 2017-03-07 ENCOUNTER — Encounter: Payer: Self-pay | Admitting: Adult Health

## 2017-03-07 VITALS — BP 146/90 | Temp 98.6°F | Ht 70.0 in | Wt 367.9 lb

## 2017-03-07 DIAGNOSIS — M170 Bilateral primary osteoarthritis of knee: Secondary | ICD-10-CM

## 2017-03-07 DIAGNOSIS — F419 Anxiety disorder, unspecified: Secondary | ICD-10-CM

## 2017-03-07 DIAGNOSIS — G8929 Other chronic pain: Secondary | ICD-10-CM

## 2017-03-07 DIAGNOSIS — M545 Low back pain: Secondary | ICD-10-CM

## 2017-03-07 MED ORDER — OXYCODONE HCL 30 MG PO TABS: 30.0000 mg | ORAL_TABLET | Freq: Three times a day (TID) | ORAL | 0 refills | Status: DC | PRN

## 2017-03-07 MED ORDER — CITALOPRAM HYDROBROMIDE 20 MG PO TABS: 20.0000 mg | ORAL_TABLET | Freq: Every day | ORAL | 3 refills | Status: DC

## 2017-03-07 NOTE — Progress Notes (Signed)
Subjective:    Patient ID: Joan PereyraMary A Petro, female    DOB: Aug 06, 1966, 51 y.o.   MRN: [id number]  HPI  51 year old female who  has a past medical history of CHF (congestive heart failure) (HCC); DVT (deep venous thrombosis) (HCC); Headache(784.0); History of chicken pox; Hypertension; Morbid obesity (HCC); and Osteoarthritis. She presents to the office today for follow up after being seen in the ER on 02/20/2017 for shortness of breath   Chest x ray and BMP and troponin were negative. D -dimer was reassuring.   She reports that she continues to have episodes of shortness of breath. These episodes last a few minutes and then resolve. She also reports that she has episodes of palpitations that accompany the shortness of breath. She feels as though this is more anxiety related as she has a lot of stress at home.   She has finally been able to get health insurance. She is continuing to try to get into a pain management clinic. She has to make some phone calls this week. She needs a refill of oxycodone ER   Review of Systems See HPI   Past Medical History:  Diagnosis Date  . CHF (congestive heart failure) (HCC)   . DVT (deep venous thrombosis) (HCC)   . Headache(784.0)   . History of chicken pox   . Hypertension   . Morbid obesity (HCC)   . Osteoarthritis     Social History   Social History  . Marital status: Divorced    Spouse name: N/A  . Number of children: N/A  . 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 Smoking status: Never Smoker  . Smokeless tobacco: Never Used  . Alcohol use No  . Drug use: No  . Sexual activity: Not on file   Other Topics Concern  . Not on file   Social History Narrative   Currently unemployed    Has one daughter in [place]     Past Surgical History:  Procedure Laterality Date  . TUBAL LIGATION  1985    Family History  Problem Relation Age of Onset  . Arthritis Mother    . Hypertension Mother   . Prostate cancer Maternal Grandfather   . Breast cancer Maternal Grandmother     No Known Allergies  Current Outpatient Prescriptions on File Prior to Visit  Medication Sig Dispense Refill  . acetaminophen (TYLENOL) 500 MG tablet Take 500 mg by mouth every 6 (six) hours as needed for mild pain.    . Amlodipine-Valsartan-HCTZ 10-320-25 MG TABS Take 1 tablet by mouth daily. 90 tablet 3  . apixaban (ELIQUIS) 5 MG TABS tablet 2 tablets PO BID for one week Then 1 tablet PO BID for 3 weeks 70 tablet 0  . atorvastatin (LIPITOR) 20 MG tablet Take 1 tablet (20 mg total) by mouth daily. 90 tablet 3  . furosemide (LASIX) 40 MG tablet Take 1.5 tablets (60 mg total) by mouth daily. 135 tablet 3  . metoprolol (LOPRESSOR) 100 MG tablet Take 1 tablet (100 mg total) by mouth 2 (two) times daily. 180 tablet 0  . spironolactone (ALDACTONE) 25 MG tablet Take 1 tablet (25 mg total) by mouth daily. 90 tablet 3   No current facility-administered medications on file prior to visit.     BP (!) 146/90 (BP Location: Left Arm, Patient Position: Sitting, Cuff Size: Normal)   Temp 98.6 F (37 C) (  Oral)   Ht 5\' 10"  (1.778 m)   Wt (!) 367 lb 14.4 oz (166.9 kg)   LMP 02/20/2017   BMI 52.79 kg/m       Objective:   Physical Exam  Constitutional: She is oriented to person, place, and time. She appears well-developed and well-nourished. No distress.  Cardiovascular: Normal rate, regular rhythm, normal heart sounds and intact distal pulses.  Exam reveals no gallop and no friction rub.   No murmur heard. Pulmonary/Chest: Effort normal and breath sounds normal. No respiratory distress. She has no wheezes. She has no rales. She exhibits no tenderness.  Musculoskeletal: She exhibits edema (bilateral lower extremity edema ).  Neurological: She is alert and oriented to person, place, and time.  Skin: Skin is warm and dry. No rash noted. She is not diaphoretic. No erythema. No pallor.    Psychiatric: She has a normal mood and affect. Her behavior is normal. Judgment and thought content normal.  Nursing note and vitals reviewed.     Assessment & Plan:  1. Anxiety - Will trial celexa 20 mg for anxiety related SOB and palpitations.  - Consider holter monitor in the future  - citalopram (CELEXA) 20 MG tablet; Take 1 tablet (20 mg total) by mouth daily.  Dispense: 30 tablet; Refill: 3  2. Chronic low back pain, unspecified back pain laterality, with sciatica presence unspecified - Reviewed controlled substance database. She is compliant with her medications and has no entries from other providers prescribing her pain medication.  - Will get UDS - oxycodone (ROXICODONE) 30 MG immediate release tablet; Take 1 tablet (30 mg total) by mouth every 8 (eight) hours as needed for pain.  Dispense: 90 tablet; Refill: 0  3. Osteoarthritis of both knees, unspecified osteoarthritis type - oxycodone (ROXICODONE) 30 MG immediate release tablet; Take 1 tablet (30 mg total) by mouth every 8 (eight) hours as needed for pain.  Dispense: 90 tablet; Refill: 0  Shirline Frees, NP

## 2017-04-05 ENCOUNTER — Ambulatory Visit (INDEPENDENT_AMBULATORY_CARE_PROVIDER_SITE_OTHER): Payer: Medicare Other | Admitting: Adult Health

## 2017-04-05 ENCOUNTER — Encounter: Payer: Self-pay | Admitting: Adult Health

## 2017-04-05 ENCOUNTER — Encounter: Payer: Self-pay | Admitting: Gastroenterology

## 2017-04-05 VITALS — BP 118/74 | HR 89 | Temp 98.2°F | Ht 70.0 in | Wt 366.0 lb

## 2017-04-05 DIAGNOSIS — M545 Low back pain: Secondary | ICD-10-CM

## 2017-04-05 DIAGNOSIS — Z1211 Encounter for screening for malignant neoplasm of colon: Secondary | ICD-10-CM | POA: Diagnosis not present

## 2017-04-05 DIAGNOSIS — M170 Bilateral primary osteoarthritis of knee: Secondary | ICD-10-CM

## 2017-04-05 DIAGNOSIS — G8929 Other chronic pain: Secondary | ICD-10-CM | POA: Diagnosis not present

## 2017-04-05 MED ORDER — OXYCODONE HCL 30 MG PO TABS: 30.0000 mg | ORAL_TABLET | Freq: Three times a day (TID) | ORAL | 0 refills | Status: DC | PRN

## 2017-04-05 NOTE — Progress Notes (Signed)
Subjective:    Patient ID: Joan PereyraMary A [name], female    DOB: Jan 27, 1966, 51 y.o.   MRN: [id number]  HPI 51 year old female who presents to the office today for follow up regarding anxiety. During her last visit she was started on Celexa 20 mg. She reports that over the last month she has noticed improvement in the amount of anxiety attacks she has had. Per patient " I have only had one anxiety attack."   Additionally, she has been calling multiple pain management clinics and needs referrals sent. She does not have the names with them- she is going to call back up here with the names so that I can make the referrals. She does not a refill of Oxycodone 30 mg ER  She continues to complain of chronic knee pain and contributes this to her weight. She is interested in seeing someone about Bariatric Surgery   She would also like to have a colonoscopy done since she now has insurance    Review of Systems See HPI   Past Medical History:  Diagnosis Date  . CHF (congestive heart failure) (HCC)   . DVT (deep venous thrombosis) (HCC)   . Headache(784.0)   . History of chicken pox   . Hypertension   . Morbid obesity (HCC)   . Osteoarthritis     Social History   Social History  . Marital status: Divorced    Spouse name: N/A  . Number of children: N/A  . 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 Smoking status: Never Smoker  . Smokeless tobacco: Never Used  . Alcohol use No  . Drug use: No  . Sexual activity: Not on file   Other Topics Concern  . Not on file   Social History Narrative   Currently unemployed    Has one daughter in [place]     Past Surgical History:  Procedure Laterality Date  . TUBAL LIGATION  1985    Family History  Problem Relation Age of Onset  . Arthritis Mother   . Hypertension Mother   . Prostate cancer Maternal Grandfather   . Breast cancer Maternal Grandmother     No Known  Allergies  Current Outpatient Prescriptions on File Prior to Visit  Medication Sig Dispense Refill  . acetaminophen (TYLENOL) 500 MG tablet Take 500 mg by mouth every 6 (six) hours as needed for mild pain.    . Amlodipine-Valsartan-HCTZ 10-320-25 MG TABS Take 1 tablet by mouth daily. 90 tablet 3  . apixaban (ELIQUIS) 5 MG TABS tablet 2 tablets PO BID for one week Then 1 tablet PO BID for 3 weeks 70 tablet 0  . atorvastatin (LIPITOR) 20 MG tablet Take 1 tablet (20 mg total) by mouth daily. 90 tablet 3  . citalopram (CELEXA) 20 MG tablet Take 1 tablet (20 mg total) by mouth daily. 30 tablet 3  . furosemide (LASIX) 40 MG tablet Take 1.5 tablets (60 mg total) by mouth daily. 135 tablet 3  . metoprolol (LOPRESSOR) 100 MG tablet Take 1 tablet (100 mg total) by mouth 2 (two) times daily. 180 tablet 0  . spironolactone (ALDACTONE) 25 MG tablet Take 1 tablet (25 mg total) by mouth daily. 90 tablet 3   No current facility-administered medications on file prior to visit.     BP 118/74   Pulse 89   Temp 98.2 F (36.8 C) (Oral)   Ht 5'  10" (1.778 m)   Wt (!) 366 lb (166 kg)   BMI 52.52 kg/m       Objective:   Physical Exam  Constitutional: She is oriented to person, place, and time.  Cardiovascular: Normal rate, regular rhythm, normal heart sounds and intact distal pulses.  Exam reveals no gallop.   No murmur heard. Pulmonary/Chest: Effort normal and breath sounds normal. No respiratory distress. She has no wheezes. She has no rales. She exhibits no tenderness.  Neurological: She is alert and oriented to person, place, and time. She has normal reflexes. She displays normal reflexes. No cranial nerve deficit. She exhibits normal muscle tone. Coordination normal.  Skin: Skin is warm and dry. No rash noted. No erythema. No pallor.  Psychiatric: She has a normal mood and affect. Her behavior is normal. Judgment and thought content normal.  Nursing note and vitals reviewed.     Assessment &  Plan:  1. Chronic low back pain, unspecified back pain laterality, with sciatica presence unspecified - Send me names of pain management clinics  - oxycodone (ROXICODONE) 30 MG immediate release tablet; Take 1 tablet (30 mg total) by mouth every 8 (eight) hours as needed for pain.  Dispense: 90 tablet; Refill: 0  2. Osteoarthritis of both knees, unspecified osteoarthritis type  - oxycodone (ROXICODONE) 30 MG immediate release tablet; Take 1 tablet (30 mg total) by mouth every 8 (eight) hours as needed for pain.  Dispense: 90 tablet; Refill: 0  3. Colon cancer screening  - Ambulatory referral to Gastroenterology  4. Morbid obesity (HCC) - Will refer to [facility] Bariatric Surgery   Shirline Frees, NP

## 2017-04-10 ENCOUNTER — Other Ambulatory Visit: Payer: Self-pay | Admitting: Adult Health

## 2017-04-24 ENCOUNTER — Telehealth: Payer: Self-pay | Admitting: Adult Health

## 2017-04-24 DIAGNOSIS — G8929 Other chronic pain: Secondary | ICD-10-CM

## 2017-04-24 DIAGNOSIS — M545 Low back pain: Secondary | ICD-10-CM

## 2017-04-24 DIAGNOSIS — M170 Bilateral primary osteoarthritis of knee: Secondary | ICD-10-CM

## 2017-04-24 NOTE — Telephone Encounter (Signed)
Referral placed in the system. 

## 2017-04-24 NOTE — Telephone Encounter (Signed)
Please refer patient

## 2017-04-24 NOTE — Telephone Encounter (Signed)
Pt is calling stating that this is the information for [facility] referral to [facility]  Attn:  Ryan   [phone])

## 2017-05-02 ENCOUNTER — Telehealth: Payer: Self-pay | Admitting: Adult Health

## 2017-05-02 DIAGNOSIS — M170 Bilateral primary osteoarthritis of knee: Secondary | ICD-10-CM

## 2017-05-02 DIAGNOSIS — G8929 Other chronic pain: Secondary | ICD-10-CM

## 2017-05-02 DIAGNOSIS — M545 Low back pain: Principal | ICD-10-CM

## 2017-05-02 MED ORDER — OXYCODONE HCL 30 MG PO TABS: 30.0000 mg | ORAL_TABLET | Freq: Three times a day (TID) | ORAL | 0 refills | Status: DC | PRN

## 2017-05-02 NOTE — Telephone Encounter (Signed)
Ok to refill for 30 days. She will not be due until 05/07/2017 per [place] controlled substance database.   Please put note to pharmacy to not fill until 05/07/2017

## 2017-05-02 NOTE — Telephone Encounter (Signed)
Pt notified to pick up at the front desk. 

## 2017-05-02 NOTE — Telephone Encounter (Signed)
Printed for Cory to sign. 

## 2017-05-02 NOTE — Telephone Encounter (Signed)
° ° ° °  Pt request refill of the following: ° ° °oxycodone (ROXICODONE) 30 MG immediate release tablet ° ° °Phamacy: °

## 2017-05-02 NOTE — Telephone Encounter (Signed)
Last filled on 04/05/17 #90 and seen same day No future appointment scheduled.  Please advise.  Thanks!!!

## 2017-05-17 ENCOUNTER — Telehealth: Payer: Self-pay

## 2017-05-17 ENCOUNTER — Other Ambulatory Visit: Payer: Self-pay

## 2017-05-17 DIAGNOSIS — Z1211 Encounter for screening for malignant neoplasm of colon: Secondary | ICD-10-CM

## 2017-05-17 NOTE — Telephone Encounter (Signed)
Dr Lavon Paganini,      BMI too high 52.52 July 2018.  Would you like an OV first or may she be a direct [facility] pt? Tecia Cinnamon/PV

## 2017-05-17 NOTE — Telephone Encounter (Signed)
Spoke with the patient. Offered 06/15/17 at [facility]. She states she will call back and let us know if she can.

## 2017-05-17 NOTE — Telephone Encounter (Signed)
-----   Message from Marylene Buerger, RN sent at 05/17/2017  2:14 PM EDT ----- July 2018 BMI 52.52 Please reschedule for [facility] and schedule PV no more than 60 days out and call pt and let her know what's going on.  Thank you thank you thank you!!!!

## 2017-05-18 NOTE — Telephone Encounter (Signed)
ok 

## 2017-05-30 ENCOUNTER — Telehealth: Payer: Self-pay | Admitting: Adult Health

## 2017-05-30 DIAGNOSIS — G8929 Other chronic pain: Secondary | ICD-10-CM

## 2017-05-30 DIAGNOSIS — M545 Low back pain: Secondary | ICD-10-CM

## 2017-05-30 DIAGNOSIS — M170 Bilateral primary osteoarthritis of knee: Secondary | ICD-10-CM

## 2017-05-30 DIAGNOSIS — M25561 Pain in right knee: Secondary | ICD-10-CM

## 2017-05-30 NOTE — Telephone Encounter (Signed)
Ok to refer.

## 2017-05-30 NOTE — Telephone Encounter (Signed)
Referral placed in Epic.

## 2017-05-30 NOTE — Telephone Encounter (Signed)
Pt would like a referral to [facility] fax # attn chris [phone]. Pt has an appt with cory on 06-05-17

## 2017-05-31 ENCOUNTER — Other Ambulatory Visit: Payer: Self-pay

## 2017-05-31 ENCOUNTER — Telehealth: Payer: Self-pay | Admitting: *Deleted

## 2017-05-31 NOTE — Telephone Encounter (Signed)
Please cancel the procedure if unable to reach

## 2017-05-31 NOTE — Telephone Encounter (Signed)
Patient rescheduled previsit on 06/05/17 at 1:30pm.

## 2017-05-31 NOTE — Telephone Encounter (Signed)
I have attempted to contact the patient and her emergency contact. Both mailboxes are full. Last contact with the patient when the procedure was moved the patient stated she would "let (us) know if those dates will work". The colonoscopy date at [facility] is 06/15/17. Please advise. Thanks.

## 2017-05-31 NOTE — Telephone Encounter (Signed)
Letter not mailed. Procedure date unchanged. Scheduled for [facility]/15/17 at [facility].

## 2017-05-31 NOTE — Telephone Encounter (Signed)
Patient no show PV on 05/29/17, she was called but no answer and unable to leave message "mail box full". I also called the patient yesterday and today with still no answer. Left message yesterday for patient to call us back to reschedule this pv. She did not. Called patient again today, unable to leave message today, "mail box full". Beth,LPN notified.

## 2017-05-31 NOTE — Telephone Encounter (Signed)
Letter to the patient to advise her of the cancellation.

## 2017-06-05 ENCOUNTER — Ambulatory Visit (INDEPENDENT_AMBULATORY_CARE_PROVIDER_SITE_OTHER): Payer: Medicare Other | Admitting: Adult Health

## 2017-06-05 VITALS — BP 146/90 | HR 83 | Temp 98.4°F | Resp 20 | Ht 70.0 in | Wt 382.0 lb

## 2017-06-05 DIAGNOSIS — R202 Paresthesia of skin: Secondary | ICD-10-CM

## 2017-06-05 DIAGNOSIS — G8929 Other chronic pain: Secondary | ICD-10-CM

## 2017-06-05 DIAGNOSIS — M170 Bilateral primary osteoarthritis of knee: Secondary | ICD-10-CM | POA: Diagnosis not present

## 2017-06-05 DIAGNOSIS — R200 Anesthesia of skin: Secondary | ICD-10-CM | POA: Diagnosis not present

## 2017-06-05 DIAGNOSIS — F1190 Opioid use, unspecified, uncomplicated: Secondary | ICD-10-CM

## 2017-06-05 DIAGNOSIS — M545 Low back pain: Secondary | ICD-10-CM

## 2017-06-05 MED ORDER — OXYCODONE HCL 30 MG PO TABS: 30.0000 mg | ORAL_TABLET | Freq: Three times a day (TID) | ORAL | 0 refills | Status: DC | PRN

## 2017-06-05 NOTE — Progress Notes (Signed)
Subjective:    Patient ID: Joan PereyraMary A Culbertson, female    DOB: [birth year], 51 y.o.   MRN: [id number]  HPI  51 year old female who  has a past medical history of CHF (congestive heart failure) (HCC); DVT (deep venous thrombosis) (HCC); Headache(784.0); History of chicken pox; Hypertension; Morbid obesity (HCC); and Osteoarthritis. She presents to the office today for multiple issues.   1. She was started on Celexa in July. She reports that she is doing well with this medication. She feels as though her anxiety is better controlled. She denies any depression.   2. About one month ago she started noticing numbness and tingling in her finger tips. This numbness and tingling is constant. She denies any issues with grip strength. She has a history per MR of lumbar spine in 2016 of bulging disk and chronic disk degeneration.   3. She has an appointment with pain management tomorrow for her initial consultation   Review of Systems  Constitutional: Negative.   HENT: Negative.   Eyes: Negative.   Respiratory: Negative.   Cardiovascular: Negative.   Gastrointestinal: Negative.   Endocrine: Negative.   Genitourinary: Negative.   Skin: Negative.   Allergic/Immunologic: Negative.   Neurological: Negative.   Hematological: Negative.   Psychiatric/Behavioral: Negative.   All other systems reviewed and are negative.  Past Medical History:  Diagnosis Date  . CHF (congestive heart failure) (HCC)   . DVT (deep venous thrombosis) (HCC)   . Headache(784.0)   . History of chicken pox   . Hypertension   . Morbid obesity (HCC)   . Osteoarthritis     Social History   Social History  . Marital status: Divorced    Spouse name: N/A  . Number of children: N/A  . 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 Smoking status: Never Smoker  . Smokeless tobacco: Never Used  . Alcohol use No  . Drug use: No  . Sexual activity: Not on  file   Other Topics Concern  . Not on file   Social History Narrative   Currently unemployed    Has one daughter in [place]     Past Surgical History:  Procedure Laterality Date  . TUBAL LIGATION  1985    Family History  Problem Relation Age of Onset  . Arthritis Mother   . Hypertension Mother   . Prostate cancer Maternal Grandfather   . Breast cancer Maternal Grandmother     No Known Allergies  Current Outpatient Prescriptions on File Prior to Visit  Medication Sig Dispense Refill  . acetaminophen (TYLENOL) 500 MG tablet Take 500 mg by mouth every 6 (six) hours as needed for mild pain.    . Amlodipine-Valsartan-HCTZ 10-320-25 MG TABS Take 1 tablet by mouth daily. 90 tablet 3  . atorvastatin (LIPITOR) 20 MG tablet Take 1 tablet (20 mg total) by mouth daily. 90 tablet 3  . citalopram (CELEXA) 20 MG tablet Take 1 tablet (20 mg total) by mouth daily. 30 tablet 3  . furosemide (LASIX) 40 MG tablet Take 1.5 tablets (60 mg total) by mouth daily. 135 tablet 3  . spironolactone (ALDACTONE) 25 MG tablet Take 1 tablet (25 mg total) by mouth daily. 90 tablet 3  . metoprolol (LOPRESSOR) 100 MG tablet Take 1 tablet (100 mg total) by mouth 2 (two) times daily. 180 tablet 0   No current facility-administered medications on file prior to visit.  BP (!) 146/90   Pulse 83   Temp 98.4 F (36.9 C)   Resp 20   Ht  (1.778 m)   Wt (!) 382 lb (173.3 kg)   SpO2 99%   BMI 54.81 kg/m       Objective:   Physical Exam  Constitutional: She is oriented to person, place, and time. She appears well-developed and well-nourished. No distress.  HENT:  Head: Normocephalic and atraumatic.  Right Ear: External ear normal.  Left Ear: External ear normal.  Nose: Nose normal.  Mouth/Throat: Oropharynx is clear and moist. No oropharyngeal exudate.  Eyes: Pupils are equal, round, and reactive to light. Conjunctivae and EOM are normal. Right eye exhibits no discharge. Left eye exhibits no  discharge. No scleral icterus.  Cardiovascular: Normal rate, regular rhythm, normal heart sounds and intact distal pulses.  Exam reveals no gallop and no friction rub.   No murmur heard. Pulmonary/Chest: Effort normal and breath sounds normal. No respiratory distress. She has no wheezes. She has no rales. She exhibits no tenderness.  Musculoskeletal: Normal range of motion. She exhibits no edema, tenderness or deformity.  Neurological: She is alert and oriented to person, place, and time. She has normal reflexes. She displays normal reflexes. No cranial nerve deficit. She exhibits normal muscle tone. Coordination normal.  Good cap refill. Normal distal pulses.  No loss of grip strength    Skin: Skin is warm and dry. No rash noted. No erythema. No pallor.  Psychiatric: She has a normal mood and affect. Her behavior is normal. Judgment and thought content normal.  Nursing note and vitals reviewed.     Assessment & Plan:  1. Chronic low back pain, unspecified back pain laterality, with sciatica presence unspecified - Will get UDS  - oxycodone (ROXICODONE) 30 MG immediate release tablet; Take 1 tablet (30 mg total) by mouth every 8 (eight) hours as needed for pain. FILL ON OR AFTER 05/07/2017.  Dispense: 90 tablet; Refill: 0  2. Osteoarthritis of both knees, unspecified osteoarthritis type  - oxycodone (ROXICODONE) 30 MG immediate release tablet; Take 1 tablet (30 mg total) by mouth every 8 (eight) hours as needed for pain. FILL ON OR AFTER 05/07/2017.  Dispense: 90 tablet; Refill: 0  3. Numbness and tingling - DG Cervical Spine Complete; Future - DG Lumbar Spine Complete; Future - Consider referral to neurosurgery or orthopedics  Shirline Frees, NP

## 2017-06-05 NOTE — Addendum Note (Signed)
Addended by: Charna ElizabethLEMMONS, Delphin Funes L on: 06/05/2017 12:48 PM   Modules accepted: Orders

## 2017-06-06 ENCOUNTER — Telehealth: Payer: Self-pay | Admitting: *Deleted

## 2017-06-06 LAB — DRUG ABUSE PANEL 10-50 NO CONF, U
AMPHETAMINES (1000 ng/mL SCRN): NEGATIVE
BARBITURATES: NEGATIVE
BENZODIAZEPINES: NEGATIVE
COCAINE METABOLITES: NEGATIVE
MARIJUANA MET (50 ng/mL SCRN): NEGATIVE
METHADONE: NEGATIVE
METHAQUALONE: NEGATIVE
OPIATES: POSITIVE — AB
PHENCYCLIDINE: NEGATIVE
PROPOXYPHENE: NEGATIVE

## 2017-06-06 NOTE — Telephone Encounter (Signed)
Patient no show PV, called pt no answer. Unable to leave message "mail box full".  Beth notified.

## 2017-06-08 ENCOUNTER — Telehealth: Payer: Self-pay

## 2017-06-08 NOTE — Telephone Encounter (Signed)
No showed again on her pre-visit. Both times, she was spoken with to schedule her appointments. Her colonoscopy is scheduled for 06/15/17. May I cancel the colonoscopy at the hospital and notify her PCP of the no show appointments?

## 2017-06-11 NOTE — Telephone Encounter (Signed)
ok 

## 2017-06-11 NOTE — Telephone Encounter (Signed)
noted 

## 2017-06-12 ENCOUNTER — Encounter: Payer: Medicare Other | Admitting: Gastroenterology

## 2017-06-15 ENCOUNTER — Encounter (HOSPITAL_COMMUNITY): Admission: RE | Payer: Self-pay | Source: Ambulatory Visit

## 2017-06-15 ENCOUNTER — Ambulatory Visit (HOSPITAL_COMMUNITY): Admission: RE | Admit: 2017-06-15 | Payer: Medicare Other | Source: Ambulatory Visit | Admitting: Gastroenterology

## 2017-06-15 SURGERY — COLONOSCOPY WITH PROPOFOL
Anesthesia: Monitor Anesthesia Care

## 2017-07-09 ENCOUNTER — Other Ambulatory Visit: Payer: Self-pay | Admitting: Family Medicine

## 2017-07-09 DIAGNOSIS — M5137 Other intervertebral disc degeneration, lumbosacral region: Secondary | ICD-10-CM

## 2017-07-09 DIAGNOSIS — R202 Paresthesia of skin: Secondary | ICD-10-CM

## 2017-07-20 ENCOUNTER — Other Ambulatory Visit: Payer: Medicare Other

## 2017-07-26 ENCOUNTER — Ambulatory Visit
Admission: RE | Admit: 2017-07-26 | Discharge: 2017-07-26 | Disposition: A | Discharge status: Home / Self Care | Payer: Medicare Other | Source: Ambulatory Visit | Attending: Family Medicine | Admitting: Family Medicine

## 2017-07-26 DIAGNOSIS — M5137 Other intervertebral disc degeneration, lumbosacral region: Secondary | ICD-10-CM

## 2017-07-26 DIAGNOSIS — R202 Paresthesia of skin: Secondary | ICD-10-CM

## 2017-09-13 ENCOUNTER — Other Ambulatory Visit: Payer: Self-pay | Admitting: Orthopedic Surgery

## 2017-09-13 DIAGNOSIS — G8929 Other chronic pain: Secondary | ICD-10-CM

## 2017-09-13 DIAGNOSIS — M545 Low back pain: Principal | ICD-10-CM

## 2017-10-01 ENCOUNTER — Ambulatory Visit
Admission: RE | Admit: 2017-10-01 | Discharge: 2017-10-01 | Disposition: A | Discharge status: Home / Self Care | Payer: Self-pay | Source: Ambulatory Visit | Attending: Orthopedic Surgery | Admitting: Orthopedic Surgery

## 2017-10-01 DIAGNOSIS — M5126 Other intervertebral disc displacement, lumbar region: Secondary | ICD-10-CM | POA: Diagnosis not present

## 2017-10-01 DIAGNOSIS — G8929 Other chronic pain: Secondary | ICD-10-CM

## 2017-10-01 DIAGNOSIS — M545 Low back pain: Principal | ICD-10-CM

## 2017-10-01 MED ORDER — METHYLPREDNISOLONE ACETATE 40 MG/ML INJ SUSP (RADIOLOG
120.0000 mg | Freq: Once | INTRAMUSCULAR | Status: AC
  Administered 2017-10-01: 120 mg via EPIDURAL

## 2017-10-01 MED ORDER — IOPAMIDOL (ISOVUE-M 200) INJECTION 41%
1.0000 mL | Freq: Once | INTRAMUSCULAR | Status: AC
  Administered 2017-10-01: 1 mL via EPIDURAL

## 2017-10-01 NOTE — Discharge Instructions (Signed)

## 2017-10-26 DIAGNOSIS — M5137 Other intervertebral disc degeneration, lumbosacral region: Secondary | ICD-10-CM | POA: Diagnosis not present

## 2017-10-26 DIAGNOSIS — M25562 Pain in left knee: Secondary | ICD-10-CM | POA: Diagnosis not present

## 2017-10-26 DIAGNOSIS — M25561 Pain in right knee: Secondary | ICD-10-CM | POA: Diagnosis not present

## 2017-10-26 DIAGNOSIS — Z79899 Other long term (current) drug therapy: Secondary | ICD-10-CM | POA: Diagnosis not present

## 2017-10-26 DIAGNOSIS — G8929 Other chronic pain: Secondary | ICD-10-CM | POA: Diagnosis not present

## 2017-11-05 DIAGNOSIS — Z780 Asymptomatic menopausal state: Secondary | ICD-10-CM | POA: Diagnosis not present

## 2017-11-13 ENCOUNTER — Ambulatory Visit (INDEPENDENT_AMBULATORY_CARE_PROVIDER_SITE_OTHER): Payer: Medicare HMO | Admitting: Adult Health

## 2017-11-13 ENCOUNTER — Encounter: Payer: Self-pay | Admitting: Adult Health

## 2017-11-13 DIAGNOSIS — R69 Illness, unspecified: Secondary | ICD-10-CM | POA: Diagnosis not present

## 2017-11-13 DIAGNOSIS — F419 Anxiety disorder, unspecified: Secondary | ICD-10-CM

## 2017-11-13 DIAGNOSIS — I10 Essential (primary) hypertension: Secondary | ICD-10-CM

## 2017-11-13 MED ORDER — AMLODIPINE-VALSARTAN-HCTZ 10-320-25 MG PO TABS: 1.0000 | ORAL_TABLET | Freq: Every day | ORAL | 0 refills | Status: DC

## 2017-11-13 MED ORDER — FUROSEMIDE 40 MG PO TABS: 60.0000 mg | ORAL_TABLET | Freq: Every day | ORAL | 0 refills | Status: DC

## 2017-11-13 MED ORDER — SPIRONOLACTONE 25 MG PO TABS: 25.0000 mg | ORAL_TABLET | Freq: Every day | ORAL | 0 refills | Status: DC

## 2017-11-13 MED ORDER — ATORVASTATIN CALCIUM 20 MG PO TABS: 20.0000 mg | ORAL_TABLET | Freq: Every day | ORAL | 0 refills | Status: DC

## 2017-11-13 MED ORDER — METOPROLOL TARTRATE 100 MG PO TABS: 100.0000 mg | ORAL_TABLET | Freq: Two times a day (BID) | ORAL | 0 refills | Status: DC

## 2017-11-13 MED ORDER — CITALOPRAM HYDROBROMIDE 20 MG PO TABS: 20.0000 mg | ORAL_TABLET | Freq: Every day | ORAL | 0 refills | Status: DC

## 2017-11-13 NOTE — Addendum Note (Signed)
Addended by: Nancy FetterNAFZIGER, Chaim Gatley L on: 11/13/2017 11:42 AM   Modules accepted: Orders

## 2017-11-13 NOTE — Progress Notes (Signed)
Subjective:    Patient ID: Joan PereyraMary A Rybacki, female    DOB: 06/16/1966, 52 y.o.   MRN: [id number]  HPI  52 year old female who  has a past medical history of CHF (congestive heart failure) (HCC), DVT (deep venous thrombosis) (HCC), Headache(784.0), History of chicken pox, Hypertension, Morbid obesity (HCC), and Osteoarthritis.  She presents to the office today for follow up of hypertension. She reports that she has been out of her diuretics for about a month. She has not been monitoring her blood pressure at home. She does endorse headaches but no blurred vision, lightheadedness, weakness, or syncope.   She is being seen by Pain Management who have her placed on Lyrica and prednisone.   BP Readings from Last 3 Encounters:  11/13/17 (!) 190/100  10/01/17 (!) 206/102  06/05/17 (!) 146/90   Wt Readings from Last 3 Encounters:  11/13/17 (!) 419 lb (190.1 kg)  06/05/17 (!) 382 lb (173.3 kg)  04/05/17 (!) 366 lb (166 kg)     Review of Systems  See HPI    Past Medical History:  Diagnosis Date  . CHF (congestive heart failure) (HCC)   . DVT (deep venous thrombosis) (HCC)   . Headache(784.0)   . History of chicken pox   . Hypertension   . Morbid obesity (HCC)   . Osteoarthritis     Social History   Socioeconomic History  . Marital status: Divorced    Spouse name: Not on file  . Number of children: Not on file  . Years of education: Not on file  . Highest education level: Not on file  Social Needs  . Financial resource strain: Not on file  . Food insecurity - worry: Not on file  . Food insecurity - inability: Not on file  . Transportation needs - medical: Not on file  . Transportation needs - non-medical: Not on file  Occupational History  . 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 Smoking status: Never Smoker  . Smokeless tobacco: Never Used  Substance and Sexual Activity  . Alcohol use: No    Alcohol/week: 0.0 oz  . Drug use: No  .  Sexual activity: Not on file  Other Topics Concern  . Not on file  Social History Narrative   Currently unemployed    Has one daughter in [place]     Past Surgical History:  Procedure Laterality Date  . TUBAL LIGATION  1985    Family History  Problem Relation Age of Onset  . Arthritis Mother   . Hypertension Mother   . Prostate cancer Maternal Grandfather   . Breast cancer Maternal Grandmother     No Known Allergies  Current Outpatient Medications on File Prior to Visit  Medication Sig Dispense Refill  . acetaminophen (TYLENOL) 500 MG tablet Take 500 mg by mouth every 6 (six) hours as needed for mild pain.    . Amlodipine-Valsartan-HCTZ 10-320-25 MG TABS Take 1 tablet by mouth daily. 90 tablet 3  . atorvastatin (LIPITOR) 20 MG tablet Take 1 tablet (20 mg total) by mouth daily. 90 tablet 3  . citalopram (CELEXA) 20 MG tablet Take 1 tablet (20 mg total) by mouth daily. 30 tablet 3  . furosemide (LASIX) 40 MG tablet Take 1.5 tablets (60 mg total) by mouth daily. 135 tablet 3  . LYRICA 75 MG capsule Take 75 mg by mouth 2 (two) times daily.  0  . oxycodone (ROXICODONE) 30 MG immediate release  tablet Take 1 tablet (30 mg total) by mouth every 8 (eight) hours as needed for pain. FILL ON OR AFTER 05/07/2017. 90 tablet 0  . predniSONE (STERAPRED UNI-PAK 21 TAB) 5 MG (21) TBPK tablet TAKE 6 TABLETS ON DAY 1 AS DIRECTED ON PACKAGE AND DECREASE BY 1 TAB EACH DAY FOR A TOTAL OF 6 DAYS  0  . metoprolol (LOPRESSOR) 100 MG tablet Take 1 tablet (100 mg total) by mouth 2 (two) times daily. 180 tablet 0  . spironolactone (ALDACTONE) 25 MG tablet Take 1 tablet (25 mg total) by mouth daily. 90 tablet 3   No current facility-administered medications on file prior to visit.     BP (!) 190/100 (BP Location: Left Wrist)   Temp 98.4 F (36.9 C) (Oral)   Wt (!) 419 lb (190.1 kg)   BMI 60.12 kg/m       Objective:   Physical Exam  Constitutional: She is oriented to person, place, and time. She  appears well-developed and well-nourished. No distress.  Cardiovascular: Normal rate, regular rhythm, normal heart sounds and intact distal pulses.  Musculoskeletal: Normal range of motion. She exhibits edema and tenderness. She exhibits no deformity.  Neurological: She is alert and oriented to person, place, and time.  Skin: Skin is warm and dry. No rash noted. She is not diaphoretic. No erythema. No pallor.  Psychiatric: She has a normal mood and affect. Her behavior is normal. Judgment and thought content normal.  Nursing note and vitals reviewed.     Assessment & Plan:  1. Essential hypertension - Will send in scripts for all of her meds as it is unclear what she is taking and not taking. Follow up in one week for CPE and BP check  - Consider clonidine  - Amlodipine-Valsartan-HCTZ 10-320-25 MG TABS; Take 1 tablet by mouth daily.  Dispense: 30 tablet; Refill: 0 - spironolactone (ALDACTONE) 25 MG tablet; Take 1 tablet (25 mg total) by mouth daily.  Dispense: 30 tablet; Refill: 0 - atorvastatin (LIPITOR) 20 MG tablet; Take 1 tablet (20 mg total) by mouth daily.  Dispense: 30 tablet; Refill: 0 - furosemide (LASIX) 40 MG tablet; Take 1.5 tablets (60 mg total) by mouth daily.  Dispense: 45 tablet; Refill: 0 - metoprolol tartrate (LOPRESSOR) 100 MG tablet; Take 1 tablet (100 mg total) by mouth 2 (two) times daily.  Dispense: 180 tablet; Refill: 0  2. Anxiety  - citalopram (CELEXA) 20 MG tablet; Take 1 tablet (20 mg total) by mouth daily.  Dispense: 30 tablet; Refill: 0  3. Morbid obesity (HCC) - 37 pounds weight gain since September.  - She is interested in Bariatric surgery. She will look into this with her insurance.   Wt Readings from Last 3 Encounters:  11/13/17 (!) 419 lb (190.1 kg)  06/05/17 (!) 382 lb (173.3 kg)  04/05/17 (!) 366 lb (166 kg)   Shirline Frees, NP

## 2017-11-14 ENCOUNTER — Encounter (HOSPITAL_COMMUNITY): Payer: Self-pay

## 2017-11-14 ENCOUNTER — Other Ambulatory Visit: Payer: Self-pay

## 2017-11-14 ENCOUNTER — Emergency Department (HOSPITAL_COMMUNITY)
Admission: EM | Admit: 2017-11-14 | Discharge: 2017-11-14 | Disposition: A | Discharge status: Home / Self Care | Payer: Medicare HMO | Attending: Emergency Medicine | Admitting: Emergency Medicine

## 2017-11-14 ENCOUNTER — Emergency Department (HOSPITAL_COMMUNITY): Payer: Medicare HMO

## 2017-11-14 DIAGNOSIS — Z79899 Other long term (current) drug therapy: Secondary | ICD-10-CM | POA: Diagnosis not present

## 2017-11-14 DIAGNOSIS — R69 Illness, unspecified: Secondary | ICD-10-CM | POA: Diagnosis not present

## 2017-11-14 DIAGNOSIS — I5032 Chronic diastolic (congestive) heart failure: Secondary | ICD-10-CM | POA: Diagnosis not present

## 2017-11-14 DIAGNOSIS — I110 Hypertensive heart disease with heart failure: Secondary | ICD-10-CM | POA: Insufficient documentation

## 2017-11-14 DIAGNOSIS — R0600 Dyspnea, unspecified: Secondary | ICD-10-CM | POA: Insufficient documentation

## 2017-11-14 DIAGNOSIS — F419 Anxiety disorder, unspecified: Secondary | ICD-10-CM | POA: Insufficient documentation

## 2017-11-14 DIAGNOSIS — I10 Essential (primary) hypertension: Secondary | ICD-10-CM

## 2017-11-14 DIAGNOSIS — G44209 Tension-type headache, unspecified, not intractable: Secondary | ICD-10-CM | POA: Insufficient documentation

## 2017-11-14 DIAGNOSIS — R0602 Shortness of breath: Secondary | ICD-10-CM | POA: Diagnosis not present

## 2017-11-14 MED ORDER — IBUPROFEN 200 MG PO TABS: 600.0000 mg | ORAL_TABLET | Freq: Once | ORAL | Status: DC

## 2017-11-14 NOTE — ED Provider Notes (Signed)
[facility] EMERGENCY DEPARTMENT Provider Note   CSN: [id number] Arrival date & time: 11/14/17  1303     History   Chief Complaint Chief Complaint  Patient presents with  . anxiety/SOB    HPI [name] is a 52 y.o. female.  Patient with hx htn, states was concerned that her bp was high.  States took one of her meds this AM, and just got the other filled. Also c/o gradual onset frontal 'tension' headache today. Pain mild-mod, frontal. No acute, abrupt or severe head pain. No neck pain or stiffness. No sinus congestion or drainage. No eye pain. Denies head trauma. No syncope.  Pt also states felt sob, and states sometimes that is anxiety related. Denies specific stressors, but says earlier, everything was getting on her nerves, even the windshield wipers.  Denies cough. No fever. No leg pain or swelling;. No cp, no pleuritic pain.    The history is provided by the patient.    Past Medical History:  Diagnosis Date  . CHF (congestive heart failure) (HCC)   . DVT (deep venous thrombosis) (HCC)   . Headache(784.0)   . History of chicken pox   . Hypertension   . Morbid obesity (HCC)   . Osteoarthritis     Patient Active Problem List   Diagnosis Date Noted  . SOB (shortness of breath) 02/17/2015  . Myalgia 07/24/2014  . Hyperlipidemia 03/11/2014  . Generalized anxiety disorder 01/14/2014  . Chronic diastolic heart failure (HCC) 10/17/2013  . Dyspnea 08/20/2013  . Hypertension 07/22/2012  . Osteoarthritis of both knees 07/22/2012  . Morbid obesity (HCC) 07/22/2012  . Left ankle pain 07/22/2012  . Right knee pain 07/22/2012  . Chronic low back pain 07/22/2012  . Polyuria 07/22/2012    Past Surgical History:  Procedure Laterality Date  . TUBAL LIGATION  1985    OB History    No data available       Home Medications    Prior to Admission medications   Medication Sig Start Date End Date Taking? Authorizing Provider  acetaminophen (TYLENOL)  500 MG tablet Take 500 mg by mouth every 6 (six) hours as needed for mild pain.    [provider]  Amlodipine-Valsartan-HCTZ 78-295-62 MG TABS Take 1 tablet by mouth daily. 11/13/17 12/13/17  Nafziger, Kandee Keen, NP  atorvastatin (LIPITOR) 20 MG tablet Take 1 tablet (20 mg total) by mouth daily. 11/13/17 12/13/17  Nafziger, Kandee Keen, NP  citalopram (CELEXA) 20 MG tablet Take 1 tablet (20 mg total) by mouth daily. 11/13/17   Nafziger, Kandee Keen, NP  furosemide (LASIX) 40 MG tablet Take 1.5 tablets (60 mg total) by mouth daily. 11/13/17 12/13/17  Nafziger, Kandee Keen, NP  LYRICA 75 MG capsule Take 75 mg by mouth 2 (two) times daily. 09/27/17   [provider]  metoprolol tartrate (LOPRESSOR) 100 MG tablet Take 1 tablet (100 mg total) by mouth 2 (two) times daily. 11/13/17 02/11/18  Nafziger, Kandee Keen, NP  oxycodone (ROXICODONE) 30 MG immediate release tablet Take 1 tablet (30 mg total) by mouth every 8 (eight) hours as needed for pain. FILL ON OR AFTER 05/07/2017. 06/05/17   Nafziger, Kandee Keen, NP  predniSONE (STERAPRED UNI-PAK 21 TAB) 5 MG (21) TBPK tablet TAKE 6 TABLETS ON DAY 1 AS DIRECTED ON PACKAGE AND DECREASE BY 1 TAB EACH DAY FOR A TOTAL OF 6 DAYS 09/27/17   [provider]  spironolactone (ALDACTONE) 25 MG tablet Take 1 tablet (25 mg total) by mouth daily. 11/13/17 12/13/17  Shirline Frees, NP    Family History Family History  Problem Relation Age of Onset  . Arthritis Mother   . Hypertension Mother   . Prostate cancer Maternal Grandfather   . Breast cancer Maternal Grandmother     Social History Social History   Tobacco Use  . Smoking status: Never Smoker  . Smokeless tobacco: Never Used  Substance Use Topics  . Alcohol use: No    Alcohol/week: 0.0 oz  . Drug use: No     Allergies   Patient has no known allergies.   Review of Systems Review of Systems  Constitutional: Negative for fever.  HENT: Negative for sore throat.   Eyes: Negative for redness.  Respiratory: Positive for  shortness of breath. Negative for cough.   Cardiovascular: Negative for chest pain.  Gastrointestinal: Negative for abdominal pain and vomiting.  Genitourinary: Negative for flank pain.  Musculoskeletal: Negative for neck pain and neck stiffness.  Skin: Negative for rash.  Neurological: Positive for headaches. Negative for syncope, speech difficulty, weakness and numbness.  Hematological: Does not bruise/bleed easily.  Psychiatric/Behavioral: Negative for confusion.     Physical Exam Updated Vital Signs BP (!) 170/103   Pulse 80   Temp 98.5 F (36.9 C) (Oral)   Resp 18   SpO2 100%   Physical Exam  Constitutional: She is oriented to person, place, and time. She appears well-developed and well-nourished. No distress.  HENT:  Head: Atraumatic.  Nose: Nose normal.  Mouth/Throat: Oropharynx is clear and moist.  No sinus or temporal tenderness.  Eyes: Conjunctivae and EOM are normal. Pupils are equal, round, and reactive to light. No scleral icterus.  Neck: Neck supple. No tracheal deviation present. No thyromegaly present.  No stiffness or rigidity.   Cardiovascular: Normal rate, regular rhythm, normal heart sounds and intact distal pulses. Exam reveals no gallop and no friction rub.  No murmur heard. Pulmonary/Chest: Effort normal and breath sounds normal. No respiratory distress.  Abdominal: Soft. Normal appearance. She exhibits no distension. There is no tenderness.  Genitourinary:  Genitourinary Comments: No cva tenderness.  Musculoskeletal: She exhibits no edema or tenderness.  Neurological: She is alert and oriented to person, place, and time. No cranial nerve deficit.  Speech clear/fluent. Motor/sens grossly intact.  Steady gait.   Skin: Skin is warm and dry. No rash noted. She is not diaphoretic.  Psychiatric: She has a normal mood and affect.  Nursing note and vitals reviewed.    ED Treatments / Results  Labs (all labs ordered are listed, but only abnormal results  are displayed) Labs Reviewed - No data to display  EKG  EKG Interpretation None       Radiology Dg Chest 2 View  Result Date: 11/14/2017 CLINICAL DATA:  Shortness of breath. EXAM: CHEST  2 VIEW COMPARISON:  Chest x-ray dated 02/20/2017 and chest CT dated 02/20/2017 FINDINGS: The heart size and pulmonary vascularity are normal considering the AP technique. No infiltrates or effusions. No acute bone abnormality. IMPRESSION: No acute abnormalities. Electronically Signed   By: Francene Boyers M.D.   On: 11/14/2017 14:16    Procedures Procedures (including critical care time)  Medications Ordered in ED Medications  ibuprofen (ADVIL,MOTRIN) tablet 600 mg (not administered)     Initial Impression / Assessment and Plan / ED Course  I have reviewed the triage vital signs and the nursing notes.  Pertinent labs & imaging results that were available during my care of the patient were reviewed by me and considered in my  medical decision making (see chart for details).  Reviewed nursing notes and prior charts for additional history.   Motrin po.  Po fluids.  Cxr.  Reviewed cxr - no pna.   bp high today - pt to take her meds as prescribed, and f/u closely with pcp for recheck. Will give htn instructions/eating guide.     Final Clinical Impressions(s) / ED Diagnoses   Final diagnoses:  None    ED Discharge Orders    None       Cathren LaineSteinl, Reene Harlacher, MD 11/14/17 1428

## 2017-11-14 NOTE — Discharge Instructions (Signed)
It was our pleasure to provide your ER care today - we hope that you feel better.  Your chest xray looks good/normal.  Your blood pressure is high today - take your blood pressure medication as prescribed, limit salt intake, and follow up with primary care doctor in the coming week.   For headache, take acetaminophen and/or ibuprofen as need.   Return to ER if worse, new symptoms, new or severe pain, increased trouble breathing, other concern.

## 2017-11-14 NOTE — ED Triage Notes (Signed)
Patient complains of 2 days of intermittently SOB an nor feeling well. Denies CP. States that she has anxiety and unsure if related, took celexa pta and 1 of her BP meds, NAD

## 2017-11-14 NOTE — ED Notes (Signed)
Patient verbalized understanding of discharge instructions and denies any further needs or questions at this time. VS stable. Patient ambulatory with steady gait.  

## 2017-11-20 ENCOUNTER — Ambulatory Visit (INDEPENDENT_AMBULATORY_CARE_PROVIDER_SITE_OTHER): Payer: Medicare HMO | Admitting: Adult Health

## 2017-11-20 ENCOUNTER — Encounter: Payer: Self-pay | Admitting: Family Medicine

## 2017-11-20 ENCOUNTER — Encounter: Payer: Self-pay | Admitting: Adult Health

## 2017-11-20 VITALS — BP 190/102 | Temp 98.5°F | Ht 69.0 in | Wt 399.0 lb

## 2017-11-20 DIAGNOSIS — Z23 Encounter for immunization: Secondary | ICD-10-CM | POA: Diagnosis not present

## 2017-11-20 DIAGNOSIS — E782 Mixed hyperlipidemia: Secondary | ICD-10-CM

## 2017-11-20 DIAGNOSIS — Z0000 Encounter for general adult medical examination without abnormal findings: Secondary | ICD-10-CM | POA: Diagnosis not present

## 2017-11-20 DIAGNOSIS — Z1211 Encounter for screening for malignant neoplasm of colon: Secondary | ICD-10-CM

## 2017-11-20 DIAGNOSIS — F411 Generalized anxiety disorder: Secondary | ICD-10-CM

## 2017-11-20 DIAGNOSIS — I5032 Chronic diastolic (congestive) heart failure: Secondary | ICD-10-CM | POA: Diagnosis not present

## 2017-11-20 DIAGNOSIS — E668 Other obesity: Secondary | ICD-10-CM | POA: Diagnosis not present

## 2017-11-20 DIAGNOSIS — R69 Illness, unspecified: Secondary | ICD-10-CM | POA: Diagnosis not present

## 2017-11-20 DIAGNOSIS — I10 Essential (primary) hypertension: Secondary | ICD-10-CM

## 2017-11-20 DIAGNOSIS — E6689 Other obesity not elsewhere classified: Secondary | ICD-10-CM

## 2017-11-20 DIAGNOSIS — Z114 Encounter for screening for human immunodeficiency virus [HIV]: Secondary | ICD-10-CM | POA: Diagnosis not present

## 2017-11-20 LAB — BASIC METABOLIC PANEL
BUN: 8 mg/dL (ref 6–23)
CHLORIDE: 101 meq/L (ref 96–112)
CO2: 30 meq/L (ref 19–32)
Calcium: 9.3 mg/dL (ref 8.4–10.5)
Creatinine, Ser: 0.76 mg/dL (ref 0.40–1.20)
GFR: 102.6 mL/min (ref >60.00)
GLUCOSE: 91 mg/dL (ref 70–99)
Potassium: 3.8 mEq/L (ref 3.5–5.1)
Sodium: 139 mEq/L (ref 135–145)

## 2017-11-20 LAB — CBC WITH DIFFERENTIAL/PLATELET
BASOS PCT: 0.6 % (ref 0.0–3.0)
Basophils Absolute: 0 10*3/uL (ref 0.0–0.1)
EOS ABS: 0.1 10*3/uL (ref 0.0–0.7)
EOS PCT: 0.9 % (ref 0.0–5.0)
HCT: 37.2 % (ref 36.0–46.0)
HEMOGLOBIN: 12.3 g/dL (ref 12.0–15.0)
LYMPHS ABS: 2 10*3/uL (ref 0.7–4.0)
Lymphocytes Relative: 33.4 % (ref 12.0–46.0)
MCHC: 33.2 g/dL (ref 30.0–36.0)
MCV: 89.2 fl (ref 78.0–100.0)
MONO ABS: 0.6 10*3/uL (ref 0.1–1.0)
Monocytes Relative: 9.3 % (ref 3.0–12.0)
NEUTROS ABS: 3.4 10*3/uL (ref 1.4–7.7)
Neutrophils Relative %: 55.8 % (ref 43.0–77.0)
PLATELETS: 257 10*3/uL (ref 150.0–400.0)
RBC: 4.17 Mil/uL (ref 3.87–5.11)
RDW: 14.8 % (ref 11.5–15.5)
WBC: 6.1 10*3/uL (ref 4.0–10.5)

## 2017-11-20 LAB — HEPATIC FUNCTION PANEL
ALBUMIN: 3.6 g/dL (ref 3.5–5.2)
ALT: 14 U/L (ref 0–35)
AST: 19 U/L (ref 0–37)
Alkaline Phosphatase: 101 U/L (ref 39–117)
BILIRUBIN TOTAL: 0.6 mg/dL (ref 0.2–1.2)
Bilirubin, Direct: 0.1 mg/dL (ref 0.0–0.3)
Total Protein: 7.1 g/dL (ref 6.0–8.3)

## 2017-11-20 LAB — POC URINALSYSI DIPSTICK (AUTOMATED)
BILIRUBIN UA: NEGATIVE
Blood, UA: NEGATIVE
Glucose, UA: NEGATIVE
Ketones, UA: NEGATIVE
LEUKOCYTES UA: NEGATIVE
Nitrite, UA: NEGATIVE
PH UA: 7 (ref 5.0–8.0)
Spec Grav, UA: 1.02 (ref 1.010–1.025)
Urobilinogen, UA: 0.2 E.U./dL

## 2017-11-20 LAB — LIPID PANEL
CHOLESTEROL: 157 mg/dL (ref 0–200)
HDL: 57.3 mg/dL (ref >39.00)
LDL CALC: 88 mg/dL (ref 0–99)
NonHDL: 99.62
TRIGLYCERIDES: 60 mg/dL (ref 0.0–149.0)
Total CHOL/HDL Ratio: 3
VLDL: 12 mg/dL (ref 0.0–40.0)

## 2017-11-20 LAB — HEMOGLOBIN A1C: HEMOGLOBIN A1C: 5.9 % (ref 4.6–6.5)

## 2017-11-20 LAB — TSH: TSH: 1.35 u[IU]/mL (ref 0.35–4.50)

## 2017-11-20 MED ORDER — METOPROLOL TARTRATE 100 MG PO TABS: ORAL_TABLET | ORAL | 0 refills | Status: AC

## 2017-11-20 NOTE — Progress Notes (Signed)
Subjective:    Patient ID: Joan PereyraMary A Mccallum, female    DOB: [birth year], 52 y.o.   MRN: [id number]  HPI Patient presents for yearly preventative medicine examination. She is a pleasant 52 year old female who  has a past medical history of CHF (congestive heart failure) (HCC), DVT (deep venous thrombosis) (HCC), Headache(784.0), History of chicken pox, Hypertension, Morbid obesity (HCC), and Osteoarthritis.  Due to history of hypertension she takes Exforge, spironolactone, lasix, and metoprolol.  I last saw her approximately 1 week ago at which time she had run out of her spironolactone and her blood pressure was uncontrolled. She has been taking Tylenol Sinus for headaches.She took her medications just prior to arrival.   BP Readings from Last 3 Encounters:  11/20/17 (!) 190/102  11/14/17 (!) 176/88  11/13/17 (!) 190/100   She takes Lipitor 20 mg for hyperlipidemia  She takes Celexa 20 mg for anxiety  She is being managed by pain management for chronic knee pain.  Currently she is prescribed Lyrica and Roxicodone 15 mg   All immunizations and health maintenance protocols were reviewed with the patient and needed orders were placed. She is due for Tdap   Appropriate screening laboratory values were ordered for the patient including screening of hyperlipidemia, renal function and hepatic function.  Medication reconciliation,  past medical history, social history, problem list and allergies were reviewed in detail with the patient  Goals were established with regard to weight loss, exercise, and  diet in compliance with medications. She is interested in seeing Bariatric surgery again, since her insurance will help pay for it. She is going to get me the information that I need to fill out.   Wt Readings from Last 3 Encounters:  11/20/17 (!) 399 lb (181 kg)  11/13/17 (!) 419 lb (190.1 kg)  06/05/17 (!) 382 lb (173.3 kg)   She is in need for a pap and mammogram - she will establish with GYN.  She is going to do cologuard.   Review of Systems  Constitutional: Negative.   HENT: Negative.   Eyes: Negative.   Respiratory: Negative.   Cardiovascular: Negative.   Gastrointestinal: Negative.   Endocrine: Negative.   Genitourinary: Negative.   Musculoskeletal: Positive for arthralgias, back pain and gait problem.  Skin: Negative.   Allergic/Immunologic: Negative.   Neurological: Positive for headaches.  Hematological: Negative.   Psychiatric/Behavioral: Negative.    Past Medical History:  Diagnosis Date  . CHF (congestive heart failure) (HCC)   . DVT (deep venous thrombosis) (HCC)   . Headache(784.0)   . History of chicken pox   . Hypertension   . Morbid obesity (HCC)   . Osteoarthritis     Social History   Socioeconomic History  . Marital status: Divorced    Spouse name: Not on file  . Number of children: Not on file  . Years of education: Not on file  . Highest education level: Not on file  Social Needs  . Financial resource strain: Not on file  . Food insecurity - worry: Not on file  . Food insecurity - inability: Not on file  . Transportation needs - medical: Not on file  . Transportation needs - non-medical: Not on file  Occupational History  . 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 Smoking status: Never Smoker  . Smokeless tobacco: Never Used  Substance and Sexual Activity  . Alcohol use: No    Alcohol/week: 0.0 oz  .  Drug use: No  . Sexual activity: Not on file  Other Topics Concern  . Not on file  Social History Narrative   Currently unemployed    Has one daughter in [place]     Past Surgical History:  Procedure Laterality Date  . TUBAL LIGATION  1985    Family History  Problem Relation Age of Onset  . Arthritis Mother   . Hypertension Mother   . Prostate cancer Maternal Grandfather   . Breast cancer Maternal Grandmother     No Known Allergies  Current Outpatient Medications on File Prior to Visit    Medication Sig Dispense Refill  . acetaminophen (TYLENOL) 500 MG tablet Take 500 mg by mouth every 6 (six) hours as needed for mild pain.    . Amlodipine-Valsartan-HCTZ 10-320-25 MG TABS Take 1 tablet by mouth daily. 30 tablet 0  . atorvastatin (LIPITOR) 20 MG tablet Take 1 tablet (20 mg total) by mouth daily. 30 tablet 0  . citalopram (CELEXA) 20 MG tablet Take 1 tablet (20 mg total) by mouth daily. 30 tablet 0  . furosemide (LASIX) 40 MG tablet Take 1.5 tablets (60 mg total) by mouth daily. 45 tablet 0  . LYRICA 75 MG capsule Take 75 mg by mouth 2 (two) times daily.  0  . oxyCODONE (ROXICODONE) 15 MG immediate release tablet Take 15 mg by mouth every 8 (eight) hours as needed for pain.    Marland Kitchen spironolactone (ALDACTONE) 25 MG tablet Take 1 tablet (25 mg total) by mouth daily. 30 tablet 0   No current facility-administered medications on file prior to visit.     BP (!) 190/102 (BP Location: Left Wrist)   Temp 98.5 F (36.9 C) (Oral)   Ht 5\' 9"  (1.753 m)   Wt (!) 399 lb (181 kg)   BMI 58.92 kg/m       Objective:   Physical Exam  Constitutional: She is oriented to person, place, and time. She appears well-developed and well-nourished. No distress.  Morbidly obese    HENT:  Head: Normocephalic and atraumatic.  Right Ear: External ear normal.  Left Ear: External ear normal.  Nose: Nose normal.  Mouth/Throat: Oropharynx is clear and moist. No oropharyngeal exudate.  Eyes: Conjunctivae and EOM are normal. Pupils are equal, round, and reactive to light. Right eye exhibits no discharge. Left eye exhibits no discharge. No scleral icterus.  Neck: Normal range of motion. Neck supple. No JVD present. No tracheal deviation present. No thyromegaly present.  Cardiovascular: Normal rate, regular rhythm, normal heart sounds and intact distal pulses. Exam reveals no gallop and no friction rub.  No murmur heard. Pulmonary/Chest: Effort normal and breath sounds normal. No stridor. No respiratory  distress. She has no wheezes. She has no rales. She exhibits no tenderness.  Abdominal: Soft. Bowel sounds are normal. She exhibits no distension and no mass. There is no tenderness. There is no rebound and no guarding.  Genitourinary:  Genitourinary Comments: Will see GYN   Musculoskeletal: Normal range of motion. She exhibits no edema, tenderness or deformity.  Walks with single prong cane   Lymphadenopathy:    She has no cervical adenopathy.  Neurological: She is alert and oriented to person, place, and time. She has normal reflexes. She displays normal reflexes. No cranial nerve deficit. She exhibits normal muscle tone. Coordination normal.  Skin: Skin is warm and dry. No rash noted. She is not diaphoretic. No erythema. No pallor.  Psychiatric: She has a normal mood and affect. Her behavior  is normal. Judgment and thought content normal.  Nursing note and vitals reviewed.     Assessment & Plan:  1. Routine general medical examination at a health care facility - Needs to lose weight  - Continue to work on diet and exercise  - Follow up in one year or sooner if needed - Basic metabolic panel - CBC with Differential/Platelet - Hemoglobin A1c - Hepatic function panel - Lipid panel - TSH - POCT Urinalysis Dipstick (Automated)  2. Mixed hyperlipidemia - Consider increasing statin  - Basic metabolic panel - CBC with Differential/Platelet - Hemoglobin A1c - Hepatic function panel - Lipid panel - TSH - POCT Urinalysis Dipstick (Automated)  3. Generalized anxiety disorder - Continue with celexa - stable   4. Essential hypertension - Will increase Metoprolol to 200 mg in am, 100 mg in pm - Follow up in 2 weeks  - Basic metabolic panel - CBC with Differential/Platelet - Hemoglobin A1c - Hepatic function panel - Lipid panel - TSH - POCT Urinalysis Dipstick (Automated) - metoprolol tartrate (LOPRESSOR) 100 MG tablet; Take two tablets ( 200 mg) in the morning and one tablet (  100 mg) in the evening.  Dispense: 90 tablet; Refill: 0  5. Chronic diastolic heart failure (HCC) - Increase Metoprolol  - Basic metabolic panel - CBC with Differential/Platelet - Hemoglobin A1c - Hepatic function panel - Lipid panel - TSH - POCT Urinalysis Dipstick (Automated)  6. Other obesity - Will be happy to refer to bariatric surgery  - Basic metabolic panel - CBC with Differential/Platelet - Hemoglobin A1c - Hepatic function panel - Lipid panel - TSH - POCT Urinalysis Dipstick (Automated)  7. Encounter for screening for HIV  - HIV antibody  8. Need for Tdap vaccination  - Tdap vaccine greater than or equal to 7yo IM  9. Colon cancer screening - Cologuard    Shirline Frees, NP

## 2017-11-20 NOTE — Patient Instructions (Signed)
It was great seeing you today   Please follow up with GYN   I have increased Metoprolol, take 200 mg in the morning and 100 mg in the evening.   Follow up with me in 2 weeks for BP recheck

## 2017-11-21 LAB — HIV ANTIBODY (ROUTINE TESTING W REFLEX): HIV 1&2 Ab, 4th Generation: NONREACTIVE

## 2017-11-23 DIAGNOSIS — M5440 Lumbago with sciatica, unspecified side: Secondary | ICD-10-CM | POA: Diagnosis not present

## 2017-11-23 DIAGNOSIS — Z79899 Other long term (current) drug therapy: Secondary | ICD-10-CM | POA: Diagnosis not present

## 2017-11-23 DIAGNOSIS — M25561 Pain in right knee: Secondary | ICD-10-CM | POA: Diagnosis not present

## 2017-11-23 DIAGNOSIS — G8929 Other chronic pain: Secondary | ICD-10-CM | POA: Diagnosis not present

## 2017-11-23 DIAGNOSIS — M542 Cervicalgia: Secondary | ICD-10-CM | POA: Diagnosis not present

## 2017-12-04 ENCOUNTER — Ambulatory Visit: Payer: Medicare HMO | Admitting: Adult Health

## 2017-12-11 ENCOUNTER — Ambulatory Visit: Payer: Medicare HMO | Admitting: Adult Health

## 2017-12-11 ENCOUNTER — Other Ambulatory Visit: Payer: Self-pay | Admitting: Adult Health

## 2017-12-11 DIAGNOSIS — F419 Anxiety disorder, unspecified: Secondary | ICD-10-CM

## 2017-12-11 DIAGNOSIS — I10 Essential (primary) hypertension: Secondary | ICD-10-CM

## 2017-12-11 DIAGNOSIS — Z0289 Encounter for other administrative examinations: Secondary | ICD-10-CM

## 2017-12-12 NOTE — Telephone Encounter (Signed)
Ok to refill all for one year except for Celexa - 90+1

## 2017-12-12 NOTE — Telephone Encounter (Signed)
Sent to the pharmacy by e-scribe. 

## 2017-12-24 DIAGNOSIS — M5137 Other intervertebral disc degeneration, lumbosacral region: Secondary | ICD-10-CM | POA: Diagnosis not present

## 2017-12-24 DIAGNOSIS — M5440 Lumbago with sciatica, unspecified side: Secondary | ICD-10-CM | POA: Diagnosis not present

## 2017-12-24 DIAGNOSIS — M25561 Pain in right knee: Secondary | ICD-10-CM | POA: Diagnosis not present

## 2017-12-24 DIAGNOSIS — Z79899 Other long term (current) drug therapy: Secondary | ICD-10-CM | POA: Diagnosis not present

## 2017-12-24 DIAGNOSIS — R209 Unspecified disturbances of skin sensation: Secondary | ICD-10-CM | POA: Diagnosis not present

## 2017-12-25 DIAGNOSIS — B351 Tinea unguium: Secondary | ICD-10-CM | POA: Diagnosis not present

## 2017-12-25 DIAGNOSIS — M79672 Pain in left foot: Secondary | ICD-10-CM | POA: Diagnosis not present

## 2017-12-25 DIAGNOSIS — I890 Lymphedema, not elsewhere classified: Secondary | ICD-10-CM | POA: Diagnosis not present

## 2018-01-23 DIAGNOSIS — M79672 Pain in left foot: Secondary | ICD-10-CM | POA: Diagnosis not present

## 2018-01-23 DIAGNOSIS — M545 Low back pain: Secondary | ICD-10-CM | POA: Diagnosis not present

## 2018-01-23 DIAGNOSIS — Z79899 Other long term (current) drug therapy: Secondary | ICD-10-CM | POA: Diagnosis not present

## 2018-01-23 DIAGNOSIS — M129 Arthropathy, unspecified: Secondary | ICD-10-CM | POA: Diagnosis not present

## 2018-02-04 ENCOUNTER — Other Ambulatory Visit: Payer: Self-pay | Admitting: Adult Health

## 2018-02-04 DIAGNOSIS — I10 Essential (primary) hypertension: Secondary | ICD-10-CM

## 2018-02-05 NOTE — Telephone Encounter (Signed)
.   Essential hypertension - Will increase Metoprolol to 200 mg in am, 100 mg in pm - Follow up in 2 weeks  - Basic metabolic panel - CBC with Differential/Platelet - Hemoglobin A1c - Hepatic function panel - Lipid panel - TSH - POCT Urinalysis Dipstick (Automated) - metoprolol tartrate (LOPRESSOR) 100 MG tablet; Take two tablets ( 200 mg) in the morning and one tablet ( 100 mg) in the evening.  Dispense: 90 tablet; Refill: 0  Pt needs BP Follow up.

## 2018-02-06 ENCOUNTER — Encounter: Payer: Self-pay | Admitting: Family Medicine

## 2018-02-06 NOTE — Telephone Encounter (Signed)
Pt is past due for bp check.  I tried to reach her by telephone and received a message that her mailbox is full.  Please advise.

## 2018-02-06 NOTE — Telephone Encounter (Signed)
30 DAY SUPPLY SENT TO THE PHARMACY BY E-SCRIBE.  WILL ALSO MAIL A LETTER.

## 2018-02-06 NOTE — Telephone Encounter (Signed)
Ok to fill for 30 days  

## 2018-02-11 DIAGNOSIS — R69 Illness, unspecified: Secondary | ICD-10-CM | POA: Diagnosis not present

## 2018-02-11 DIAGNOSIS — N926 Irregular menstruation, unspecified: Secondary | ICD-10-CM | POA: Diagnosis not present

## 2018-02-11 DIAGNOSIS — I10 Essential (primary) hypertension: Secondary | ICD-10-CM | POA: Diagnosis not present

## 2018-02-19 DIAGNOSIS — R5383 Other fatigue: Secondary | ICD-10-CM | POA: Diagnosis not present

## 2018-02-19 DIAGNOSIS — R0602 Shortness of breath: Secondary | ICD-10-CM | POA: Diagnosis not present

## 2018-02-19 DIAGNOSIS — R635 Abnormal weight gain: Secondary | ICD-10-CM | POA: Diagnosis not present

## 2018-02-19 DIAGNOSIS — I10 Essential (primary) hypertension: Secondary | ICD-10-CM | POA: Diagnosis not present

## 2018-02-20 DIAGNOSIS — R9431 Abnormal electrocardiogram [ECG] [EKG]: Secondary | ICD-10-CM | POA: Diagnosis not present

## 2018-02-22 DIAGNOSIS — Z79899 Other long term (current) drug therapy: Secondary | ICD-10-CM | POA: Diagnosis not present

## 2018-02-22 DIAGNOSIS — M545 Low back pain: Secondary | ICD-10-CM | POA: Diagnosis not present

## 2018-02-26 DIAGNOSIS — R1084 Generalized abdominal pain: Secondary | ICD-10-CM | POA: Diagnosis not present

## 2018-02-26 DIAGNOSIS — I10 Essential (primary) hypertension: Secondary | ICD-10-CM | POA: Diagnosis not present

## 2018-02-26 DIAGNOSIS — R635 Abnormal weight gain: Secondary | ICD-10-CM | POA: Diagnosis not present

## 2018-03-05 ENCOUNTER — Other Ambulatory Visit: Payer: Self-pay | Admitting: Adult Health

## 2018-03-05 DIAGNOSIS — I10 Essential (primary) hypertension: Secondary | ICD-10-CM

## 2018-03-06 NOTE — Telephone Encounter (Signed)
Kandee KeenCory, you authorized 30 day supply on last refill.  Please advise.

## 2018-03-07 NOTE — Telephone Encounter (Signed)
Tried to reach the pt.  Received a message that she is not accepting phone calls.  Voicemail box is full.  Rx sent to the pharmacy for 30 days supply.

## 2018-03-07 NOTE — Telephone Encounter (Signed)
Ok for 30 days. Please call and get her in

## 2018-03-22 DIAGNOSIS — Z79899 Other long term (current) drug therapy: Secondary | ICD-10-CM | POA: Diagnosis not present

## 2018-03-22 DIAGNOSIS — M5440 Lumbago with sciatica, unspecified side: Secondary | ICD-10-CM | POA: Diagnosis not present

## 2018-04-01 DIAGNOSIS — R635 Abnormal weight gain: Secondary | ICD-10-CM | POA: Diagnosis not present

## 2018-04-12 ENCOUNTER — Other Ambulatory Visit: Payer: Self-pay | Admitting: Adult Health

## 2018-04-12 DIAGNOSIS — I10 Essential (primary) hypertension: Secondary | ICD-10-CM

## 2018-04-19 DIAGNOSIS — Z79899 Other long term (current) drug therapy: Secondary | ICD-10-CM | POA: Diagnosis not present

## 2018-04-19 DIAGNOSIS — M5440 Lumbago with sciatica, unspecified side: Secondary | ICD-10-CM | POA: Diagnosis not present

## 2018-04-21 ENCOUNTER — Other Ambulatory Visit: Payer: Self-pay | Admitting: Adult Health

## 2018-04-21 DIAGNOSIS — I10 Essential (primary) hypertension: Secondary | ICD-10-CM

## 2018-04-24 DIAGNOSIS — R5383 Other fatigue: Secondary | ICD-10-CM | POA: Diagnosis not present

## 2018-04-24 DIAGNOSIS — R69 Illness, unspecified: Secondary | ICD-10-CM | POA: Diagnosis not present

## 2018-04-24 DIAGNOSIS — F329 Major depressive disorder, single episode, unspecified: Secondary | ICD-10-CM | POA: Diagnosis not present

## 2018-05-02 DIAGNOSIS — R635 Abnormal weight gain: Secondary | ICD-10-CM | POA: Diagnosis not present

## 2018-05-21 DIAGNOSIS — M79604 Pain in right leg: Secondary | ICD-10-CM | POA: Diagnosis not present

## 2018-05-21 DIAGNOSIS — M5440 Lumbago with sciatica, unspecified side: Secondary | ICD-10-CM | POA: Diagnosis not present

## 2018-05-21 DIAGNOSIS — M25561 Pain in right knee: Secondary | ICD-10-CM | POA: Diagnosis not present

## 2018-05-21 DIAGNOSIS — Z79899 Other long term (current) drug therapy: Secondary | ICD-10-CM | POA: Diagnosis not present

## 2018-05-22 DIAGNOSIS — R69 Illness, unspecified: Secondary | ICD-10-CM | POA: Diagnosis not present

## 2018-05-22 DIAGNOSIS — F329 Major depressive disorder, single episode, unspecified: Secondary | ICD-10-CM | POA: Diagnosis not present

## 2018-05-23 DIAGNOSIS — M25561 Pain in right knee: Secondary | ICD-10-CM | POA: Diagnosis not present

## 2018-05-23 DIAGNOSIS — M25569 Pain in unspecified knee: Secondary | ICD-10-CM | POA: Diagnosis not present

## 2018-05-23 DIAGNOSIS — R69 Illness, unspecified: Secondary | ICD-10-CM | POA: Diagnosis not present

## 2018-05-23 DIAGNOSIS — M542 Cervicalgia: Secondary | ICD-10-CM | POA: Diagnosis not present

## 2018-05-29 DIAGNOSIS — M170 Bilateral primary osteoarthritis of knee: Secondary | ICD-10-CM | POA: Diagnosis not present

## 2018-06-20 DIAGNOSIS — Z79899 Other long term (current) drug therapy: Secondary | ICD-10-CM | POA: Diagnosis not present

## 2018-06-20 DIAGNOSIS — M25569 Pain in unspecified knee: Secondary | ICD-10-CM | POA: Diagnosis not present

## 2018-06-20 DIAGNOSIS — Z6841 Body Mass Index (BMI) 40.0 and over, adult: Secondary | ICD-10-CM | POA: Diagnosis not present

## 2018-06-20 DIAGNOSIS — M545 Low back pain: Secondary | ICD-10-CM | POA: Diagnosis not present

## 2018-06-20 DIAGNOSIS — M5440 Lumbago with sciatica, unspecified side: Secondary | ICD-10-CM | POA: Diagnosis not present

## 2018-07-19 DIAGNOSIS — M25569 Pain in unspecified knee: Secondary | ICD-10-CM | POA: Diagnosis not present

## 2018-07-19 DIAGNOSIS — M545 Low back pain: Secondary | ICD-10-CM | POA: Diagnosis not present

## 2018-07-19 DIAGNOSIS — Z79899 Other long term (current) drug therapy: Secondary | ICD-10-CM | POA: Diagnosis not present

## 2018-07-19 DIAGNOSIS — M5440 Lumbago with sciatica, unspecified side: Secondary | ICD-10-CM | POA: Diagnosis not present

## 2018-08-19 DIAGNOSIS — M25569 Pain in unspecified knee: Secondary | ICD-10-CM | POA: Diagnosis not present

## 2018-08-19 DIAGNOSIS — Z79899 Other long term (current) drug therapy: Secondary | ICD-10-CM | POA: Diagnosis not present

## 2018-08-19 DIAGNOSIS — M545 Low back pain: Secondary | ICD-10-CM | POA: Diagnosis not present

## 2018-08-19 DIAGNOSIS — M5440 Lumbago with sciatica, unspecified side: Secondary | ICD-10-CM | POA: Diagnosis not present

## 2018-09-13 DIAGNOSIS — M5440 Lumbago with sciatica, unspecified side: Secondary | ICD-10-CM | POA: Diagnosis not present

## 2018-09-13 DIAGNOSIS — M545 Low back pain: Secondary | ICD-10-CM | POA: Diagnosis not present

## 2018-09-13 DIAGNOSIS — Z79899 Other long term (current) drug therapy: Secondary | ICD-10-CM | POA: Diagnosis not present

## 2018-09-13 DIAGNOSIS — M5137 Other intervertebral disc degeneration, lumbosacral region: Secondary | ICD-10-CM | POA: Diagnosis not present

## 2018-12-08 ENCOUNTER — Other Ambulatory Visit: Payer: Self-pay | Admitting: Adult Health

## 2018-12-08 DIAGNOSIS — I10 Essential (primary) hypertension: Secondary | ICD-10-CM

## 2018-12-09 NOTE — Telephone Encounter (Signed)
Sent to the pharmacy by e-scribe. 

## 2019-01-09 ENCOUNTER — Other Ambulatory Visit: Payer: Self-pay | Admitting: Adult Health

## 2019-01-09 DIAGNOSIS — I10 Essential (primary) hypertension: Secondary | ICD-10-CM

## 2019-10-21 IMAGING — XA Imaging study
2 series · 2 of 2 positions shown · non-contrast
Comparison: none

CLINICAL DATA: Chronic low back pain. Displacement of the L4-5
lumbar disc. Patient data have good relief from a similar injection
3 years ago.

[Series 1: ortho adipose · 1 of 1 slices shown (1 of 2)]
[im 1/1]
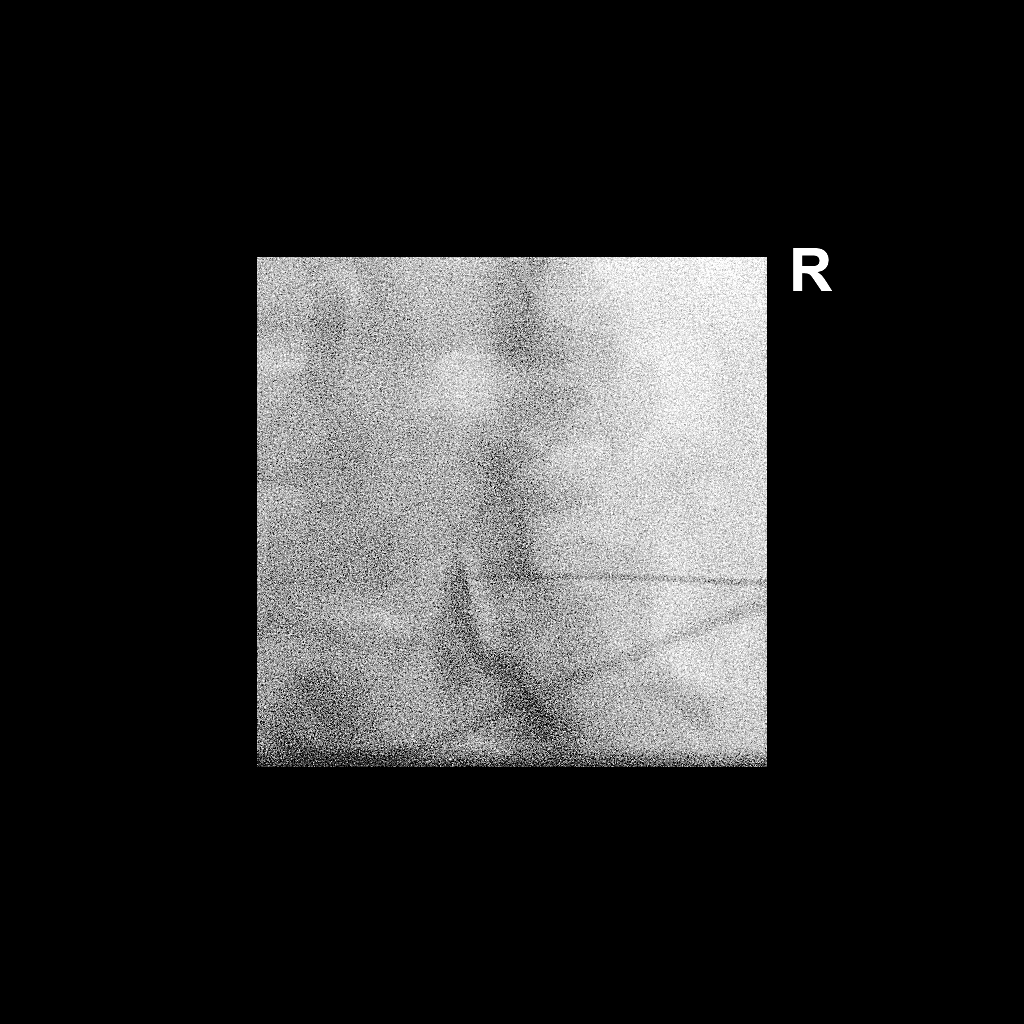

[Series 2: ortho adipose · 1 of 1 slices shown (2 of 2)]
[im 1/1]
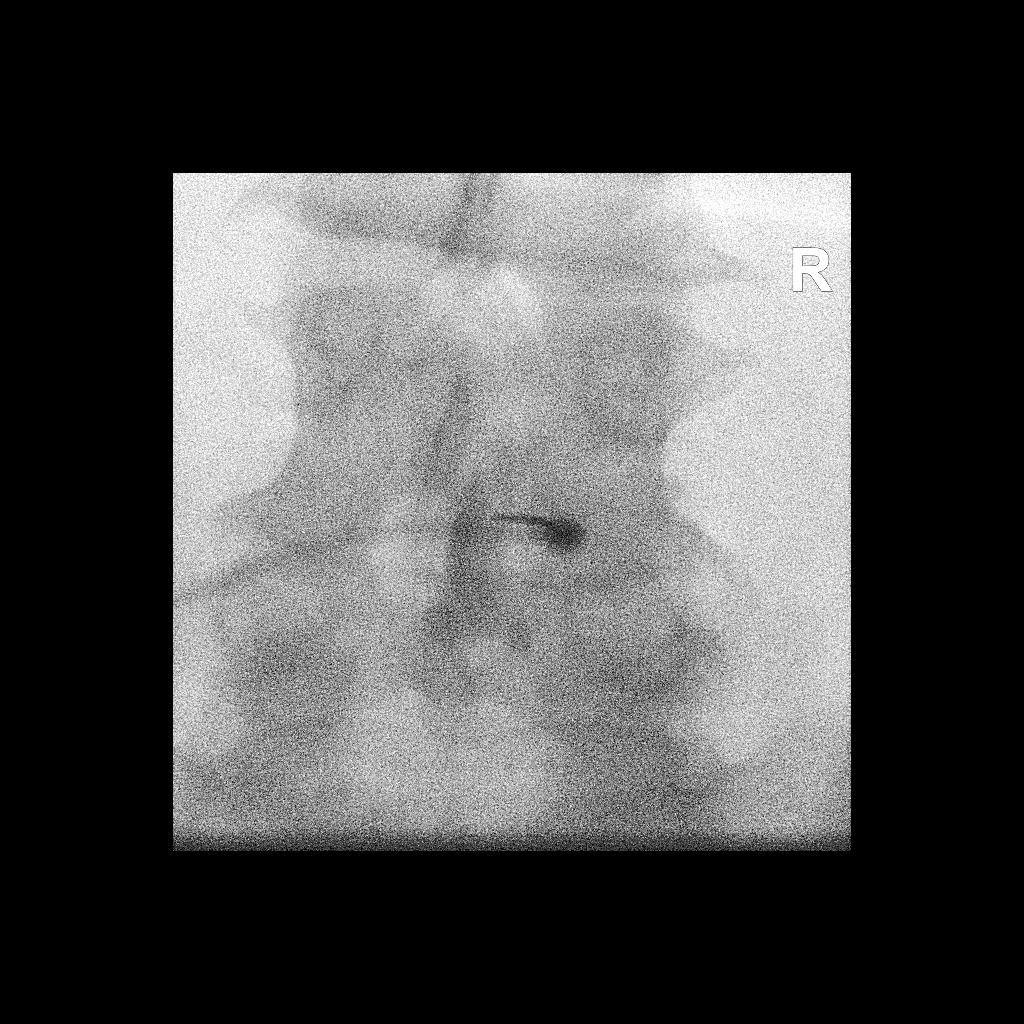

[2 of 2 positions shown; findings below may reference images not displayed]

FLUOROSCOPY TIME:  Radiation Exposure Index (as provided by the
fluoroscopic device): 47.85 uGy*m2

Fluoroscopy Time:  24 seconds

Number of Acquired Images:  0

PROCEDURE:
The procedure, risks, benefits, and alternatives were explained to
the patient. Questions regarding the procedure were encouraged and
answered. The patient understands and consents to the procedure.

LUMBAR EPIDURAL INJECTION:

An interlaminar approach was performed on right at L4-5. The
overlying skin was cleansed and anesthetized. A 20 gauge epidural
needle was advanced using loss-of-resistance technique.

DIAGNOSTIC EPIDURAL INJECTION:

Injection of Isovue-M 200 shows a good epidural pattern with spread
above and below the level of needle placement, primarily on the
right no vascular opacification is seen.

THERAPEUTIC EPIDURAL INJECTION:

120 Mg of Depo-Medrol mixed with 3 mL 1% lidocaine were instilled.
The procedure was well-tolerated, and the patient was discharged
thirty minutes following the injection in good condition.

COMPLICATIONS:
None
IMPRESSION: Technically successful epidural injection on the right L4-5 # 1

## 2019-12-04 IMAGING — CR DG CHEST 2V
2 series · 2 of 2 positions shown · non-contrast
Comparison: Chest x-ray dated 02/20/2017 and chest CT dated
02/20/2017

CLINICAL DATA: Shortness of breath.

EXAM:
CHEST  2 VIEW

[chest lat]
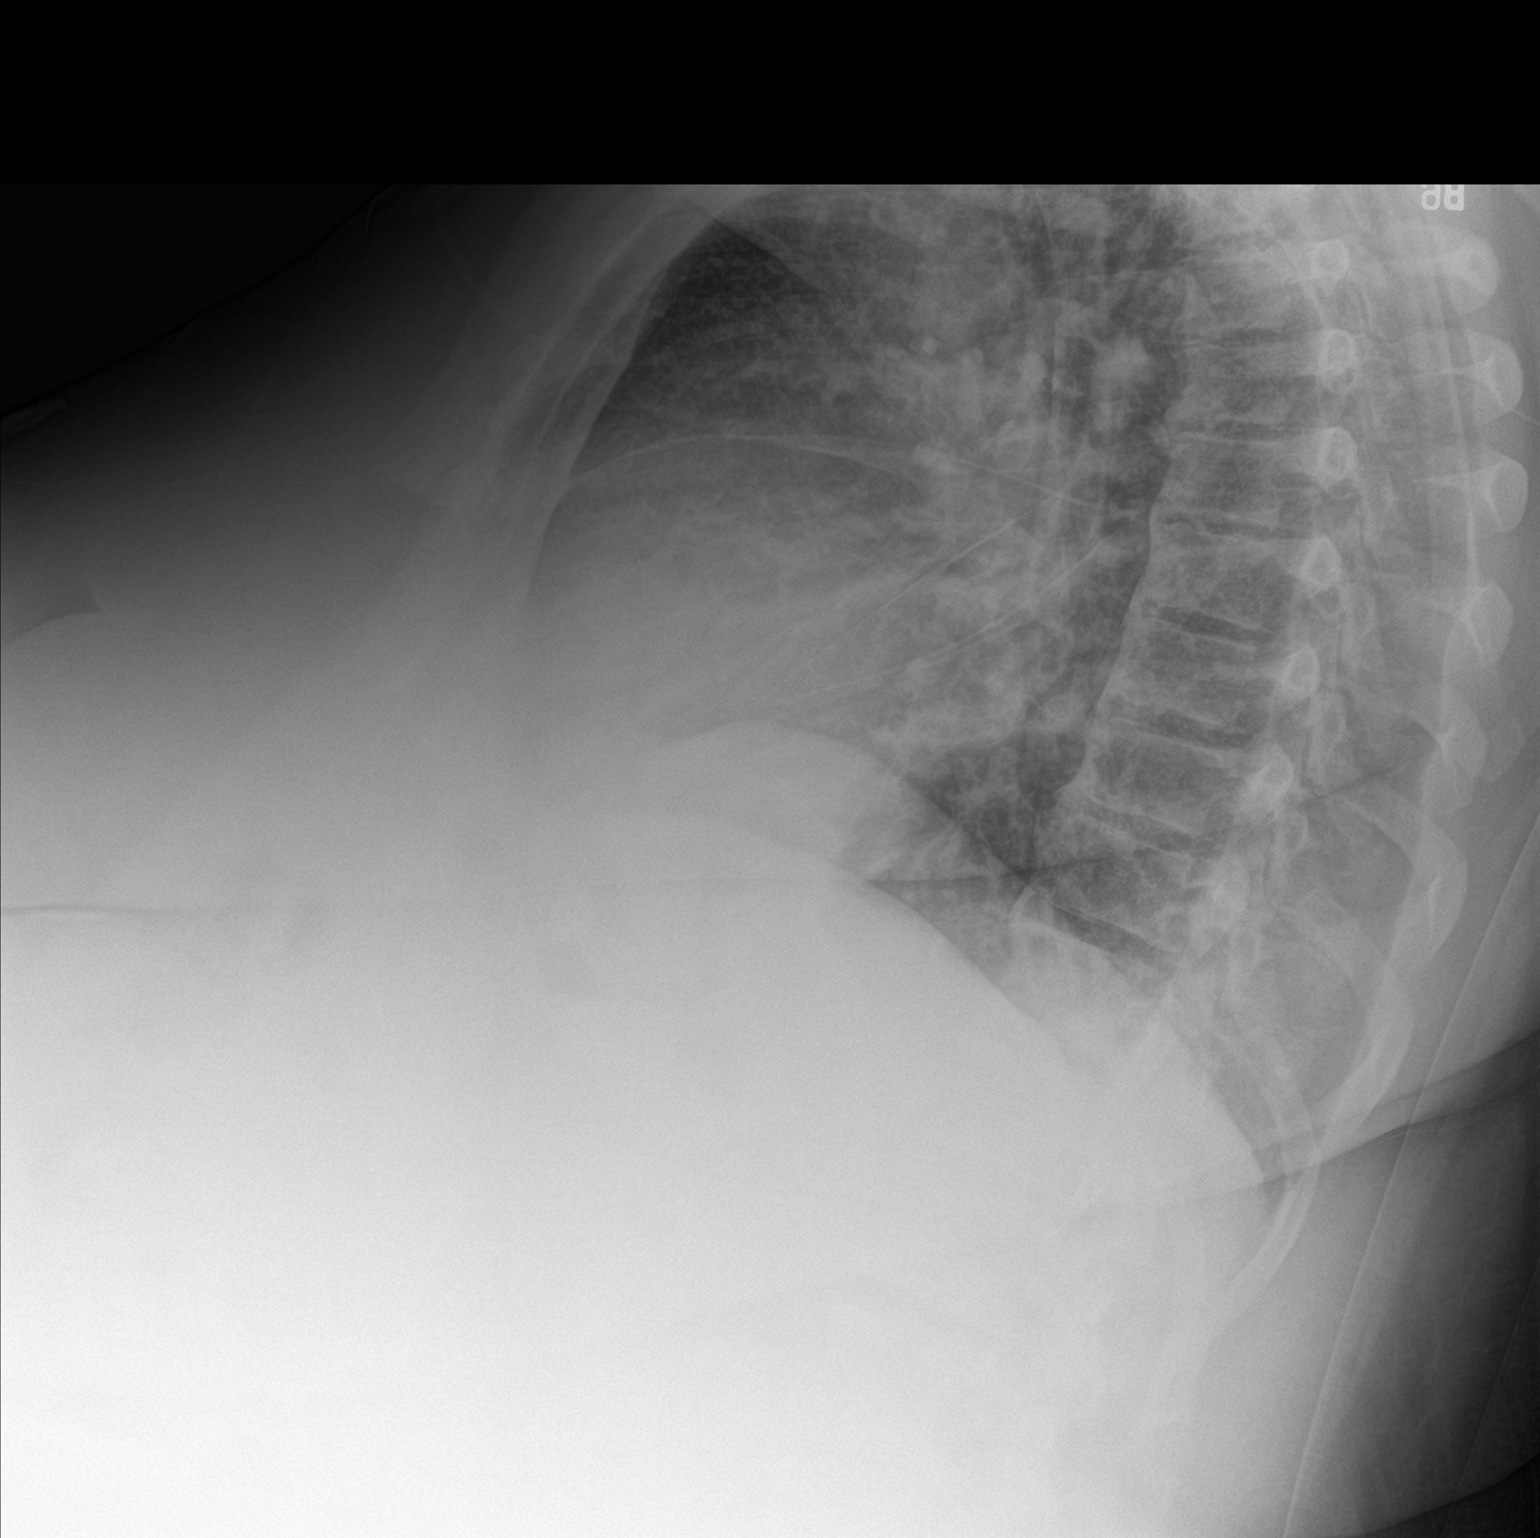

[chest ap]
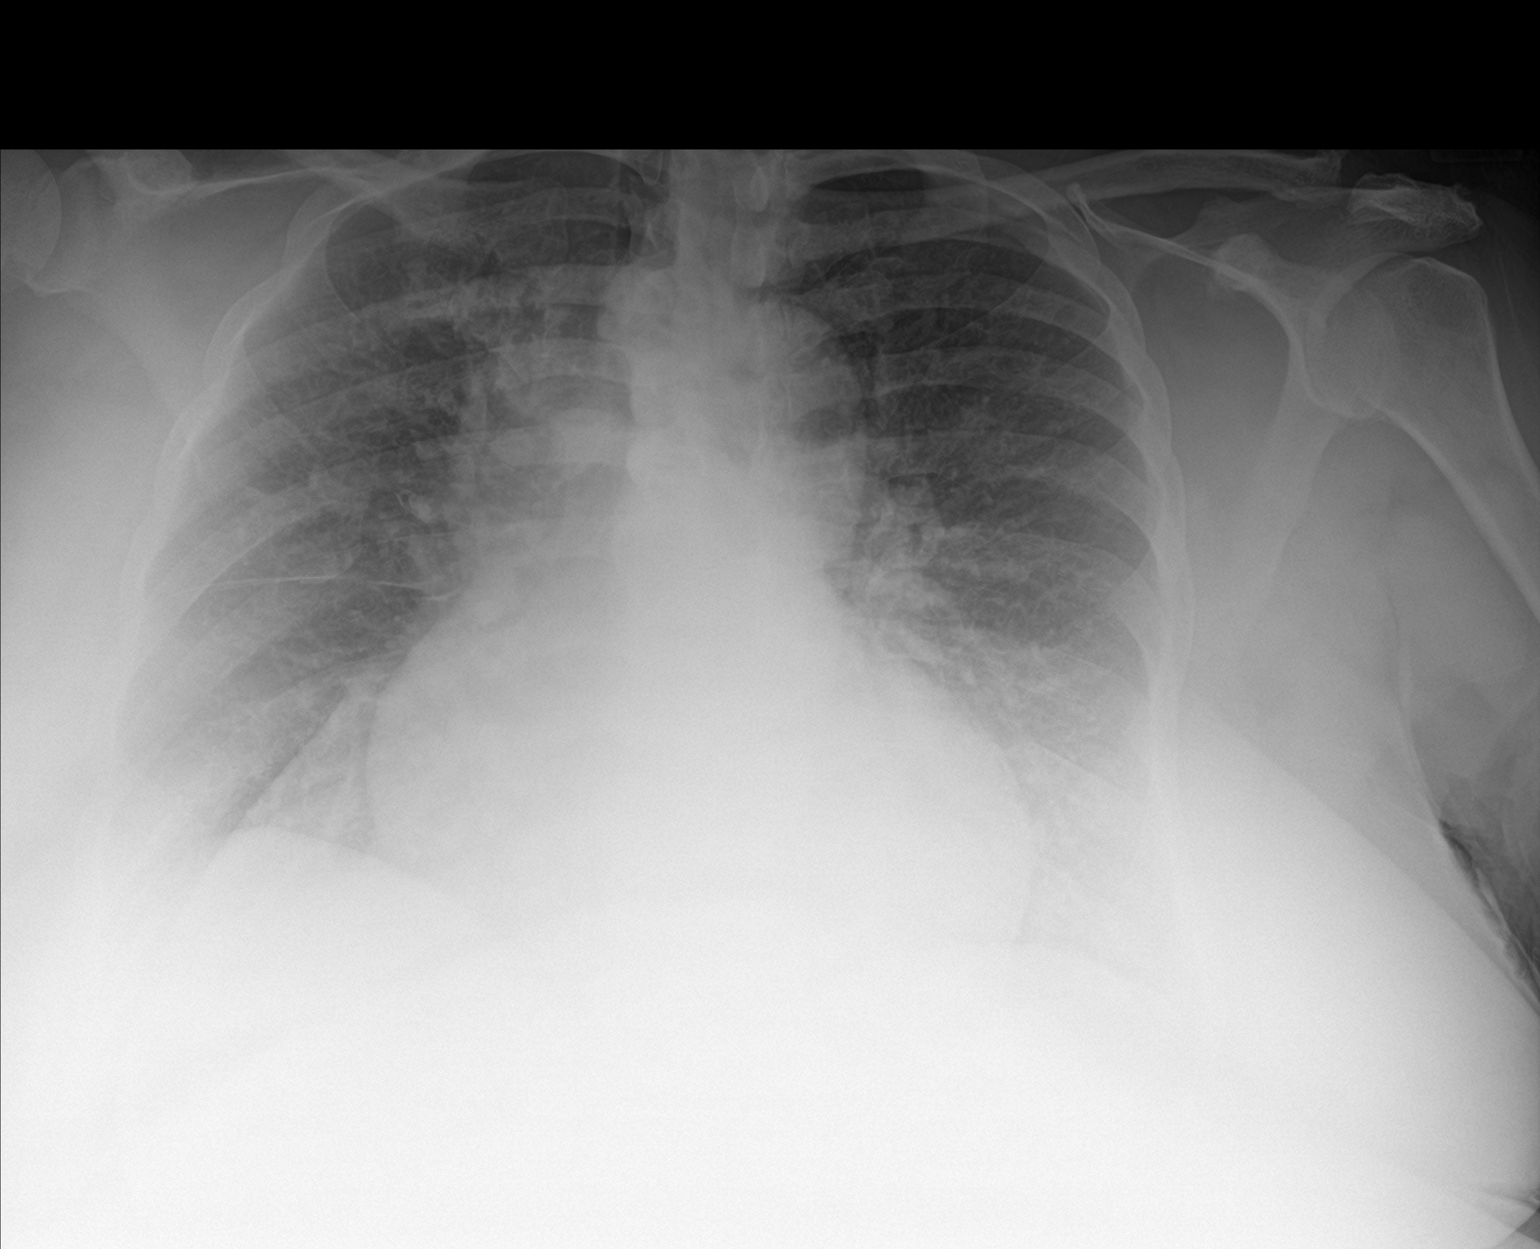

[2 of 2 positions shown; findings below may reference images not displayed]

FINDINGS: The heart size and pulmonary vascularity are normal considering the
AP technique. No infiltrates or effusions. No acute bone
abnormality.
IMPRESSION: No acute abnormalities.

## 2020-10-08 DIAGNOSIS — Z79899 Other long term (current) drug therapy: Secondary | ICD-10-CM | POA: Diagnosis not present

## 2020-10-08 DIAGNOSIS — I10 Essential (primary) hypertension: Secondary | ICD-10-CM | POA: Diagnosis not present

## 2020-10-08 DIAGNOSIS — M5137 Other intervertebral disc degeneration, lumbosacral region: Secondary | ICD-10-CM | POA: Diagnosis not present

## 2020-10-08 DIAGNOSIS — Z9181 History of falling: Secondary | ICD-10-CM | POA: Diagnosis not present

## 2020-10-08 DIAGNOSIS — Z79891 Long term (current) use of opiate analgesic: Secondary | ICD-10-CM | POA: Diagnosis not present

## 2020-10-08 DIAGNOSIS — M545 Low back pain, unspecified: Secondary | ICD-10-CM | POA: Diagnosis not present

## 2020-10-08 DIAGNOSIS — G894 Chronic pain syndrome: Secondary | ICD-10-CM | POA: Diagnosis not present

## 2020-11-03 DIAGNOSIS — Z79899 Other long term (current) drug therapy: Secondary | ICD-10-CM | POA: Diagnosis not present

## 2020-11-03 DIAGNOSIS — Z9181 History of falling: Secondary | ICD-10-CM | POA: Diagnosis not present

## 2020-11-03 DIAGNOSIS — M545 Low back pain, unspecified: Secondary | ICD-10-CM | POA: Diagnosis not present

## 2020-11-03 DIAGNOSIS — M5137 Other intervertebral disc degeneration, lumbosacral region: Secondary | ICD-10-CM | POA: Diagnosis not present

## 2020-11-03 DIAGNOSIS — Z79891 Long term (current) use of opiate analgesic: Secondary | ICD-10-CM | POA: Diagnosis not present

## 2020-11-03 DIAGNOSIS — G894 Chronic pain syndrome: Secondary | ICD-10-CM | POA: Diagnosis not present

## 2020-11-03 DIAGNOSIS — I10 Essential (primary) hypertension: Secondary | ICD-10-CM | POA: Diagnosis not present

## 2020-11-29 DIAGNOSIS — I10 Essential (primary) hypertension: Secondary | ICD-10-CM | POA: Diagnosis not present

## 2020-12-01 DIAGNOSIS — I10 Essential (primary) hypertension: Secondary | ICD-10-CM | POA: Diagnosis not present

## 2020-12-01 DIAGNOSIS — M79642 Pain in left hand: Secondary | ICD-10-CM | POA: Diagnosis not present

## 2020-12-01 DIAGNOSIS — M5137 Other intervertebral disc degeneration, lumbosacral region: Secondary | ICD-10-CM | POA: Diagnosis not present

## 2020-12-01 DIAGNOSIS — Z79899 Other long term (current) drug therapy: Secondary | ICD-10-CM | POA: Diagnosis not present

## 2020-12-01 DIAGNOSIS — M79641 Pain in right hand: Secondary | ICD-10-CM | POA: Diagnosis not present

## 2020-12-01 DIAGNOSIS — Z9181 History of falling: Secondary | ICD-10-CM | POA: Diagnosis not present

## 2020-12-01 DIAGNOSIS — M545 Low back pain, unspecified: Secondary | ICD-10-CM | POA: Diagnosis not present

## 2020-12-15 DIAGNOSIS — Z1159 Encounter for screening for other viral diseases: Secondary | ICD-10-CM | POA: Diagnosis not present

## 2020-12-15 DIAGNOSIS — N951 Menopausal and female climacteric states: Secondary | ICD-10-CM | POA: Diagnosis not present

## 2020-12-15 DIAGNOSIS — E559 Vitamin D deficiency, unspecified: Secondary | ICD-10-CM | POA: Diagnosis not present

## 2020-12-15 DIAGNOSIS — D539 Nutritional anemia, unspecified: Secondary | ICD-10-CM | POA: Diagnosis not present

## 2020-12-15 DIAGNOSIS — Z79899 Other long term (current) drug therapy: Secondary | ICD-10-CM | POA: Diagnosis not present

## 2020-12-15 DIAGNOSIS — Z9181 History of falling: Secondary | ICD-10-CM | POA: Diagnosis not present

## 2020-12-15 DIAGNOSIS — M129 Arthropathy, unspecified: Secondary | ICD-10-CM | POA: Diagnosis not present

## 2020-12-15 DIAGNOSIS — Z20822 Contact with and (suspected) exposure to covid-19: Secondary | ICD-10-CM | POA: Diagnosis not present

## 2020-12-15 DIAGNOSIS — K219 Gastro-esophageal reflux disease without esophagitis: Secondary | ICD-10-CM | POA: Diagnosis not present

## 2020-12-15 DIAGNOSIS — E119 Type 2 diabetes mellitus without complications: Secondary | ICD-10-CM | POA: Diagnosis not present

## 2020-12-15 DIAGNOSIS — R600 Localized edema: Secondary | ICD-10-CM | POA: Diagnosis not present

## 2020-12-15 DIAGNOSIS — I10 Essential (primary) hypertension: Secondary | ICD-10-CM | POA: Diagnosis not present

## 2020-12-15 DIAGNOSIS — R5383 Other fatigue: Secondary | ICD-10-CM | POA: Diagnosis not present

## 2020-12-15 DIAGNOSIS — Z6841 Body Mass Index (BMI) 40.0 and over, adult: Secondary | ICD-10-CM | POA: Diagnosis not present

## 2020-12-15 DIAGNOSIS — E7800 Pure hypercholesterolemia, unspecified: Secondary | ICD-10-CM | POA: Diagnosis not present

## 2020-12-30 DIAGNOSIS — Z9181 History of falling: Secondary | ICD-10-CM | POA: Diagnosis not present

## 2020-12-30 DIAGNOSIS — M545 Low back pain, unspecified: Secondary | ICD-10-CM | POA: Diagnosis not present

## 2020-12-30 DIAGNOSIS — M79642 Pain in left hand: Secondary | ICD-10-CM | POA: Diagnosis not present

## 2020-12-30 DIAGNOSIS — M5137 Other intervertebral disc degeneration, lumbosacral region: Secondary | ICD-10-CM | POA: Diagnosis not present

## 2020-12-30 DIAGNOSIS — I10 Essential (primary) hypertension: Secondary | ICD-10-CM | POA: Diagnosis not present

## 2020-12-30 DIAGNOSIS — Z79899 Other long term (current) drug therapy: Secondary | ICD-10-CM | POA: Diagnosis not present

## 2020-12-30 DIAGNOSIS — M79641 Pain in right hand: Secondary | ICD-10-CM | POA: Diagnosis not present

## 2021-01-17 ENCOUNTER — Other Ambulatory Visit: Payer: Self-pay

## 2021-01-17 ENCOUNTER — Encounter: Payer: Self-pay | Admitting: Hematology and Oncology

## 2021-01-17 ENCOUNTER — Inpatient Hospital Stay: Payer: Medicare HMO | Attending: Hematology and Oncology | Admitting: Hematology and Oncology

## 2021-01-17 ENCOUNTER — Inpatient Hospital Stay: Payer: Medicare HMO

## 2021-01-17 VITALS — BP 194/99 | HR 109 | Temp 98.3°F | Resp 18 | Wt 365.4 lb

## 2021-01-17 DIAGNOSIS — Z79899 Other long term (current) drug therapy: Secondary | ICD-10-CM | POA: Insufficient documentation

## 2021-01-17 DIAGNOSIS — I110 Hypertensive heart disease with heart failure: Secondary | ICD-10-CM | POA: Diagnosis not present

## 2021-01-17 DIAGNOSIS — Z86718 Personal history of other venous thrombosis and embolism: Secondary | ICD-10-CM | POA: Insufficient documentation

## 2021-01-17 DIAGNOSIS — I509 Heart failure, unspecified: Secondary | ICD-10-CM | POA: Diagnosis not present

## 2021-01-17 DIAGNOSIS — D500 Iron deficiency anemia secondary to blood loss (chronic): Secondary | ICD-10-CM

## 2021-01-17 DIAGNOSIS — D509 Iron deficiency anemia, unspecified: Secondary | ICD-10-CM | POA: Diagnosis not present

## 2021-01-17 LAB — IRON AND TIBC
Iron: 19 ug/dL — ABNORMAL LOW (ref 41–142)
Saturation Ratios: 5 % — ABNORMAL LOW (ref 21–57)
TIBC: 414 ug/dL (ref 236–444)
UIBC: 395 ug/dL — ABNORMAL HIGH (ref 120–384)

## 2021-01-17 LAB — CBC WITH DIFFERENTIAL (CANCER CENTER ONLY)
Abs Immature Granulocytes: 0.01 10*3/uL (ref 0.00–0.07)
Basophils Absolute: 0 10*3/uL (ref 0.0–0.1)
Basophils Relative: 1 %
Eosinophils Absolute: 0.1 10*3/uL (ref 0.0–0.5)
Eosinophils Relative: 1 %
HCT: 29.9 % — ABNORMAL LOW (ref 36.0–46.0)
Hemoglobin: 9.2 g/dL — ABNORMAL LOW (ref 12.0–15.0)
Immature Granulocytes: 0 %
Lymphocytes Relative: 50 %
Lymphs Abs: 2.7 10*3/uL (ref 0.7–4.0)
MCH: 25.6 pg — ABNORMAL LOW (ref 26.0–34.0)
MCHC: 30.8 g/dL (ref 30.0–36.0)
MCV: 83.1 fL (ref 80.0–100.0)
Monocytes Absolute: 0.5 10*3/uL (ref 0.1–1.0)
Monocytes Relative: 10 %
Neutro Abs: 2 10*3/uL (ref 1.7–7.7)
Neutrophils Relative %: 38 %
Platelet Count: 257 10*3/uL (ref 150–400)
RBC: 3.6 MIL/uL — ABNORMAL LOW (ref 3.87–5.11)
RDW: 14.8 % (ref 11.5–15.5)
WBC Count: 5.3 10*3/uL (ref 4.0–10.5)
nRBC: 0 % (ref 0.0–0.2)

## 2021-01-17 LAB — CMP (CANCER CENTER ONLY)
ALT: 12 U/L (ref 0–44)
AST: 18 U/L (ref 15–41)
Albumin: 3.7 g/dL (ref 3.5–5.0)
Alkaline Phosphatase: 140 U/L — ABNORMAL HIGH (ref 38–126)
Anion gap: 9 (ref 5–15)
BUN: 14 mg/dL (ref 6–20)
CO2: 26 mmol/L (ref 22–32)
Calcium: 9.1 mg/dL (ref 8.9–10.3)
Chloride: 107 mmol/L (ref 98–111)
Creatinine: 0.89 mg/dL (ref 0.44–1.00)
GFR, Estimated: >60 mL/min (ref >60)
Glucose, Bld: 91 mg/dL (ref 70–99)
Potassium: 3.8 mmol/L (ref 3.5–5.1)
Sodium: 142 mmol/L (ref 135–145)
Total Bilirubin: 0.3 mg/dL (ref 0.3–1.2)
Total Protein: 7.4 g/dL (ref 6.5–8.1)

## 2021-01-17 LAB — FERRITIN: Ferritin: 13 ng/mL (ref 11–307)

## 2021-01-17 LAB — RETIC PANEL
Immature Retic Fract: 14.7 % (ref 2.3–15.9)
RBC.: 3.57 MIL/uL — ABNORMAL LOW (ref 3.87–5.11)
Retic Count, Absolute: 37.5 10*3/uL (ref 19.0–186.0)
Retic Ct Pct: 1.1 % (ref 0.4–3.1)
Reticulocyte Hemoglobin: 28.2 pg (ref >27.9)

## 2021-01-17 LAB — VITAMIN B12: Vitamin B-12: 375 pg/mL (ref 180–914)

## 2021-01-17 LAB — FOLATE: Folate: 10.2 ng/mL (ref >5.9)

## 2021-01-17 NOTE — Progress Notes (Signed)
[facility] Telephone:([phone]) [phone]   Fax:(336) [phone]  INITIAL CONSULT NOTE  Patient Care Team: Pcp, No as PCP - General  Hematological/Oncological History # Iron Deficiency Anemia 2/2 to GYN Bleeding 02/13/2020: WBC 5.6, hgb 9.7, MCV 87, Plt 295 01/03/2021: WBC 5.2, Hgb 10.7, MCV 87, Plt 276 01/17/2021: establish care with Dr. Leonides Schanzorsey   CHIEF COMPLAINTS/PURPOSE OF CONSULTATION:  "Iron Deficiency Anemia "  HISTORY OF PRESENTING ILLNESS:  [name] 55 y.o. female with medical history significant for CHF, DVT, HTN, and morbid obesity who presents for evaluation of iron deficiency anemia.  On review of the previous records Mrs. [name] has had a CBC in our system on 11/20/2017.  At that time she was noted to have white blood cell count of 6.1, hemoglobin 12.3, and a platelet count of 257 with an MCV of 89.2.  More recently on 02/13/2019 the patient was noted to have a hemoglobin of 9.7 with an MCV of 87.  On 01/03/2021 the patient was noted to have a white blood cell count of 5.2, hemoglobin 10.7, MCV of 87, and platelet count of 276.  Due to concern for this patient's anemia she was referred to hematology for further evaluation management.  On exam today Mrs. [name] reports that she has had low iron "forever".  She reports that it has been an issue for most of her life and that she is "tired all the time".  She notes that when she tries to take in caffeine or sugar to improve her energy levels it only makes her symptoms worse.  She reports she was taking iron "for years" but that the iron pills caused her constipation.  She was having dark stools.  Due to the symptoms the patient stopped the iron pills a few months ago.  She is not currently having any menstrual cycles as she is currently going through menopause.  She reports that her last menstrual cycle was last year.  Prior to menopause her menstrual cycles were "heavy".  That she was using an overnight pad every 30 minutes.   She has not had a menstrual cycle for over 1 year though.  On further discussion the patient has a family history remarkable for colon cancer in her father and "neck cancer" in her daughter.  She is a never smoker and only occasionally drinks alcohol.  She does endorse having ice cravings but denies having any shortness of breath or chest pain.  She did have a remote history of left lower extremity DVT but reports that this was "many years ago".  A full 10 point ROS is listed below.  MEDICAL HISTORY:  Past Medical History:  Diagnosis Date  . CHF (congestive heart failure) (HCC)   . DVT (deep venous thrombosis) (HCC)   . Headache(784.0)   . History of chicken pox   . Hypertension   . Morbid obesity (HCC)   . Osteoarthritis     SURGICAL HISTORY: Past Surgical History:  Procedure Laterality Date  . TUBAL LIGATION  1985    SOCIAL HISTORY: Social History   Socioeconomic History  . Marital status: Divorced    Spouse name: Not on file  . Number of children: Not on file  . Years of education: Not on file  . Highest education level: Not on file  Occupational History  . 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 Smoking status: Never Smoker  . Smokeless tobacco: Never Used  Substance and Sexual Activity  .  Alcohol use: No    Alcohol/week: 0.0 standard drinks  . Drug use: No  . Sexual activity: Not on file  Other Topics Concern  . Not on file  Social History Narrative   Currently unemployed    Has one daughter in [place]    Social Determinants of Health   Financial Resource Strain: Not on file  Food Insecurity: Not on file  Transportation Needs: Not on file  Physical Activity: Not on file  Stress: Not on file  Social Connections: Not on file  Intimate Partner Violence: Not on file    FAMILY HISTORY: Family History  Problem Relation Age of Onset  . Arthritis Mother   . Hypertension Mother   . Prostate cancer Maternal Grandfather   . Breast  cancer Maternal Grandmother     ALLERGIES:  has No Known Allergies.  MEDICATIONS:  Current Outpatient Medications  Medication Sig Dispense Refill  . acetaminophen (TYLENOL) 500 MG tablet Take 500 mg by mouth every 6 (six) hours as needed for mild pain.    Marland Kitchen albuterol (VENTOLIN HFA) 108 (90 Base) MCG/ACT inhaler albuterol sulfate HFA 90 mcg/actuation aerosol inhaler    . ALPRAZolam (XANAX) 0.25 MG tablet Take 0.25 mg by mouth 3 (three) times daily as needed.    Marland Kitchen amLODIPine-Valsartan-HCTZ 10-320-25 MG TABS TAKE 1 TABLET BY MOUTH EVERY DAY 90 tablet 3  . atorvastatin (LIPITOR) 20 MG tablet TAKE 1 TABLET BY MOUTH EVERY DAY 90 tablet 3  . buprenorphine (BUTRANS) 20 MCG/HR PTWK 1 patch once a week.    . citalopram (CELEXA) 20 MG tablet TAKE 1 TABLET BY MOUTH EVERY DAY 90 tablet 1  . ergocalciferol (VITAMIN D2) 1.25 MG (50000 UT) capsule ergocalciferol (vitamin D2) 1,250 mcg (50,000 unit) capsule  TAKE 1 (ONE) CAPSULE BY MOUTH WEEKLY    . ferrous sulfate 324 MG TBEC ferrous sulfate 324 mg (65 mg iron) tablet,delayed release  TAKE 1 TABLET BY MOUTH TWICE A DAY    . furosemide (LASIX) 40 MG tablet TAKE 1.5 TABLETS BY MOUTH DAILY. 135 tablet 0  . lisinopril (ZESTRIL) 10 MG tablet lisinopril 10 mg tablet  TAKE 1 TABLET BY MOUTH EVERY DAY    . LYRICA 75 MG capsule Take 75 mg by mouth 2 (two) times daily.  0  . Magnesium Oxide 500 MG TABS magnesium oxide 500 mg tablet  TAKE 1 TABLET BY MOUTH EVERYDAY AT BEDTIME    . metoprolol tartrate (LOPRESSOR) 100 MG tablet Take two tablets ( 200 mg) in the morning and one tablet ( 100 mg) in the evening. 90 tablet 0  . oxyCODONE (ROXICODONE) 15 MG immediate release tablet Take 15 mg by mouth every 8 (eight) hours as needed for pain.    . phentermine 37.5 MG capsule Take 37.5 mg by mouth daily.    . Semaglutide, 1 MG/DOSE, (OZEMPIC, 1 MG/DOSE,) 4 MG/3ML SOPN Ozempic 1 mg/dose (4 mg/3 mL) subcutaneous pen injector    . senna-docusate (SENOKOT-S) 8.6-50 MG tablet  Senna Plus 8.6 mg-50 mg tablet  2 (TWO) TABLET AS NEEDED    . spironolactone (ALDACTONE) 25 MG tablet TAKE 1 TABLET BY MOUTH EVERY DAY 90 tablet 3  . topiramate (TOPAMAX) 50 MG tablet topiramate 50 mg tablet     No current facility-administered medications for this visit.    REVIEW OF SYSTEMS:   Constitutional: ( - ) fevers, ( - )  chills , ( - ) night sweats Eyes: ( - ) blurriness of vision, ( - ) double vision, ( - )  watery eyes Ears, nose, mouth, throat, and face: ( - ) mucositis, ( - ) sore throat Respiratory: ( - ) cough, ( - ) dyspnea, ( - ) wheezes Cardiovascular: ( - ) palpitation, ( - ) chest discomfort, ( - ) lower extremity swelling Gastrointestinal:  ( - ) nausea, ( - ) heartburn, ( - ) change in bowel habits Skin: ( - ) abnormal skin rashes Lymphatics: ( - ) new lymphadenopathy, ( - ) easy bruising Neurological: ( - ) numbness, ( - ) tingling, ( - ) new weaknesses Behavioral/Psych: ( - ) mood change, ( - ) new changes  All other systems were reviewed with the patient and are negative.  PHYSICAL EXAMINATION: Vitals:   01/17/21 1329  BP: (!) 194/99  Pulse: (!) 109  Resp: 18  Temp: 98.3 F (36.8 C)  SpO2: 98%   Filed Weights   01/17/21 1329  Weight: (!) 365 lb 6.4 oz (165.7 kg)    GENERAL: well appearing obese middle aged [place] American female in NAD  SKIN: skin color, texture, turgor are normal, no rashes or significant lesions EYES: conjunctiva are pink and non-injected, sclera clear LUNGS: clear to auscultation and percussion with normal breathing effort HEART: regular rate & rhythm and no murmurs and no lower extremity edema Musculoskeletal: no cyanosis of digits and no clubbing  PSYCH: alert & oriented x 3, fluent speech NEURO: no focal motor/sensory deficits  LABORATORY DATA:  I have reviewed the data as listed CBC Latest Ref Rng & Units 11/20/2017 02/20/2017 06/08/2016  WBC 4.0 - 10.5 K/uL 6.1 5.2 6.8  Hemoglobin 12.0 - 15.0 g/dL 26.8 11.2(L) 11.1(L)   Hematocrit 36.0 - 46.0 % 37.2 34.8(L) 34.4(L)  Platelets 150.0 - 400.0 K/uL 257.0 215 260    CMP Latest Ref Rng & Units 11/20/2017 02/20/2017 06/08/2016  Glucose 70 - 99 mg/dL 91 341(D) 622(W)  BUN 6 - 23 mg/dL 8 7 8   Creatinine 0.40 - 1.20 mg/dL 9.79 8.92  Sodium 135 - 145 mEq/L 139 136 137  Potassium 3.5 - 5.1 mEq/L 3.8 3.7 4.0  Chloride 96 - 112 mEq/L 101 103 104  CO2 19 - 32 mEq/L 30 25 27   Calcium 8.4 - 10.5 mg/dL 9.3 8.9 9.4  Total Protein 6.0 - 8.3 g/dL 7.1 7.3 -  Total Bilirubin 0.2 - 1.2 mg/dL 0.6 0.8 -  Alkaline Phos 39 - 117 U/L 101 95 -  AST 0 - 37 U/L 19 18 -  ALT 0 - 35 U/L 14 11(L) -    RADIOGRAPHIC STUDIES: No results found.  ASSESSMENT & PLAN VINEY ACOCELLA 55 y.o. female with medical history significant for CHF, DVT, HTN, and morbid obesity who presents for evaluation of iron deficiency anemia.  # Iron Deficiency Anemia 2/2 to GYN Bleeding -- Findings are most consistent with iron deficiency anemia secondary to GYN bleeding.  Patient has started menopause this year and is no longer having menstrual bleeding. -- Patient was previously on iron pills but discontinued them due to symptoms. -- Recommend proceeding with IV Feraheme 510 mg q. 7 days x 2 doses.  The patient would like to start on 02/03/2021 if feasible. -- We will additionally check folate and vitamin B12 levels in order to make sure were not missing other causes of the patient's anemia. -- Return to clinic approximately 4 to 6 weeks after last dose of IV iron.  Orders Placed This Encounter  Procedures  . CBC with Differential (Cancer Center Only)  Standing Status:   Future    Number of Occurrences:   1    Standing Expiration Date:   01/17/2022  . CMP (Cancer Center only)    Standing Status:   Future    Number of Occurrences:   1    Standing Expiration Date:   01/17/2022  . Retic Panel    Standing Status:   Future    Number of Occurrences:   1    Standing Expiration Date:   01/17/2022  . Iron  and TIBC    Standing Status:   Future    Number of Occurrences:   1    Standing Expiration Date:   01/17/2022  . Ferritin    Standing Status:   Future    Number of Occurrences:   1    Standing Expiration Date:   01/17/2022  . Folate, Serum    Standing Status:   Future    Number of Occurrences:   1    Standing Expiration Date:   01/17/2022  . Vitamin B12    Standing Status:   Future    Number of Occurrences:   1    Standing Expiration Date:   01/17/2022    All questions were answered. The patient knows to call the clinic with any problems, questions or concerns.  A total of more than 60 minutes were spent on this encounter and over half of that time was spent on counseling and coordination of care as outlined above.   Ulysees Barns, MD Department of Hematology/Oncology [facility] Phone: [phone] Pager: [phone] Email: [email or web address]  01/17/2021 2:21 PM

## 2021-01-25 ENCOUNTER — Telehealth: Payer: Self-pay | Admitting: Hematology and Oncology

## 2021-01-25 ENCOUNTER — Other Ambulatory Visit: Payer: Self-pay | Admitting: Hematology and Oncology

## 2021-01-25 NOTE — Telephone Encounter (Signed)
Scheduled appts per 5/3 sch msg. Pt aware.  

## 2021-01-26 DIAGNOSIS — M79642 Pain in left hand: Secondary | ICD-10-CM | POA: Diagnosis not present

## 2021-01-26 DIAGNOSIS — G894 Chronic pain syndrome: Secondary | ICD-10-CM | POA: Diagnosis not present

## 2021-01-26 DIAGNOSIS — Z9181 History of falling: Secondary | ICD-10-CM | POA: Diagnosis not present

## 2021-01-26 DIAGNOSIS — M5137 Other intervertebral disc degeneration, lumbosacral region: Secondary | ICD-10-CM | POA: Diagnosis not present

## 2021-01-26 DIAGNOSIS — I10 Essential (primary) hypertension: Secondary | ICD-10-CM | POA: Diagnosis not present

## 2021-01-26 DIAGNOSIS — M545 Low back pain, unspecified: Secondary | ICD-10-CM | POA: Diagnosis not present

## 2021-01-26 DIAGNOSIS — Z79899 Other long term (current) drug therapy: Secondary | ICD-10-CM | POA: Diagnosis not present

## 2021-01-26 DIAGNOSIS — M79641 Pain in right hand: Secondary | ICD-10-CM | POA: Diagnosis not present

## 2021-01-27 DIAGNOSIS — I10 Essential (primary) hypertension: Secondary | ICD-10-CM | POA: Diagnosis not present

## 2021-02-01 ENCOUNTER — Telehealth: Payer: Self-pay | Admitting: Hematology and Oncology

## 2021-02-01 NOTE — Telephone Encounter (Signed)
Scheduled appts per 5/10 sch msg. Pt aware.  

## 2021-02-01 NOTE — Progress Notes (Signed)
Intravenous Iron Formulation Change  Ms Carriero has insurance that requires a change in intravenous iron product from Feraheme to Venofer. Orders have been updated to reflect this change and scheduling message sent to adjust infusion appointments. Dr Leonides Schanz notified and agrees with the plan.  Allergies: No Known Allergies  The plan for iron therapy is as follows: Venofer 200 mg ivpb x 5 doses.  Stephens Shire, PharmD 02/01/2021

## 2021-02-03 DIAGNOSIS — Z1231 Encounter for screening mammogram for malignant neoplasm of breast: Secondary | ICD-10-CM | POA: Diagnosis not present

## 2021-02-03 DIAGNOSIS — Z780 Asymptomatic menopausal state: Secondary | ICD-10-CM | POA: Diagnosis not present

## 2021-02-04 ENCOUNTER — Other Ambulatory Visit: Payer: Self-pay

## 2021-02-04 ENCOUNTER — Inpatient Hospital Stay: Payer: Medicare HMO | Attending: Hematology and Oncology

## 2021-02-04 VITALS — BP 167/99 | HR 85 | Temp 97.6°F | Resp 18

## 2021-02-04 DIAGNOSIS — D500 Iron deficiency anemia secondary to blood loss (chronic): Secondary | ICD-10-CM

## 2021-02-04 DIAGNOSIS — D509 Iron deficiency anemia, unspecified: Secondary | ICD-10-CM | POA: Diagnosis not present

## 2021-02-04 MED ORDER — SODIUM CHLORIDE 0.9 % IV SOLN
200.0000 mg | Freq: Once | INTRAVENOUS | Status: AC
  Administered 2021-02-04: 200 mg via INTRAVENOUS
  Filled 2021-02-04: qty 200

## 2021-02-04 MED ORDER — SODIUM CHLORIDE 0.9 % IV SOLN
Freq: Once | INTRAVENOUS | Status: AC
  Filled 2021-02-04: qty 250

## 2021-02-04 MED ORDER — PEGFILGRASTIM-JMDB 6 MG/0.6ML ~~LOC~~ SOSY
PREFILLED_SYRINGE | SUBCUTANEOUS | Status: AC
  Filled 2021-02-04: qty 0.6

## 2021-02-04 NOTE — Patient Instructions (Signed)

## 2021-02-04 NOTE — Progress Notes (Signed)
Patient was observed for 30 minutes post infusion with issues. Vitals stable and patient in no distress upon leaving infusion clinic.

## 2021-02-11 ENCOUNTER — Other Ambulatory Visit: Payer: Self-pay

## 2021-02-11 ENCOUNTER — Inpatient Hospital Stay: Payer: Medicare HMO

## 2021-02-11 VITALS — BP 149/84 | HR 93 | Temp 97.9°F | Resp 18

## 2021-02-11 DIAGNOSIS — D509 Iron deficiency anemia, unspecified: Secondary | ICD-10-CM | POA: Diagnosis not present

## 2021-02-11 DIAGNOSIS — D500 Iron deficiency anemia secondary to blood loss (chronic): Secondary | ICD-10-CM

## 2021-02-11 MED ORDER — SODIUM CHLORIDE 0.9 % IV SOLN
Freq: Once | INTRAVENOUS | Status: AC
  Filled 2021-02-11: qty 250

## 2021-02-11 MED ORDER — SODIUM CHLORIDE 0.9 % IV SOLN
200.0000 mg | Freq: Once | INTRAVENOUS | Status: AC
  Administered 2021-02-11: 200 mg via INTRAVENOUS
  Filled 2021-02-11: qty 200

## 2021-02-11 NOTE — Progress Notes (Signed)
Pt discharged after infusion without any signs of distress, in stable condition, and with all her questions answered. Pt declined to stay for 30 min observation, pt instructed to call clinic with any concerns.

## 2021-02-11 NOTE — Patient Instructions (Signed)

## 2021-02-18 ENCOUNTER — Inpatient Hospital Stay: Payer: Medicare HMO

## 2021-02-18 ENCOUNTER — Other Ambulatory Visit: Payer: Self-pay

## 2021-02-18 VITALS — BP 169/102 | HR 90 | Temp 98.4°F | Resp 20

## 2021-02-18 DIAGNOSIS — D500 Iron deficiency anemia secondary to blood loss (chronic): Secondary | ICD-10-CM

## 2021-02-18 DIAGNOSIS — D509 Iron deficiency anemia, unspecified: Secondary | ICD-10-CM | POA: Diagnosis not present

## 2021-02-18 MED ORDER — SODIUM CHLORIDE 0.9 % IV SOLN
Freq: Once | INTRAVENOUS | Status: AC
  Filled 2021-02-18: qty 250

## 2021-02-18 MED ORDER — SODIUM CHLORIDE 0.9 % IV SOLN
200.0000 mg | Freq: Once | INTRAVENOUS | Status: AC
  Administered 2021-02-18: 200 mg via INTRAVENOUS
  Filled 2021-02-18: qty 200

## 2021-02-18 NOTE — Patient Instructions (Signed)
[facility] MEDICAL [facility]COL[facility]  Discharge Instructions: Thank you for choosing [facility] to provide your oncology and hematology care.   If you have a lab appointment with the Cancer Center, please go directly to the Cancer Center and check in at the registration area.   Wear comfortable clothing and clothing appropriate for easy access to any Portacath or PICC line.   We strive to give you quality time with your provider. You may need to reschedule your appointment if you arrive late (15 or more minutes).  Arriving late affects you and other patients whose appointments are after yours.  Also, if you miss three or more appointments without notifying the office, you may be dismissed from the clinic at the provider's discretion.      For prescription refill requests, have your pharmacy contact our office and allow 72 hours for refills to be completed.    Today you received the following medication - Venofer     To help prevent nausea and vomiting after your treatment, we encourage you to take your nausea medication as directed.  BELOW ARE SYMPTOMS THAT SHOULD BE REPORTED IMMEDIATELY: . *FEVER GREATER THAN 100.4 F (38 C) OR HIGHER . *CHILLS OR SWEATING . *NAUSEA AND VOMITING THAT IS NOT CONTROLLED WITH YOUR NAUSEA MEDICATION . *UNUSUAL SHORTNESS OF BREATH . *UNUSUAL BRUISING OR BLEEDING . *URINARY PROBLEMS (pain or burning when urinating, or frequent urination) . *BOWEL PROBLEMS (unusual diarrhea, constipation, pain near the anus) . TENDERNESS IN MOUTH AND THROAT WITH OR WITHOUT PRESENCE OF ULCERS (sore throat, sores in mouth, or a toothache) . UNUSUAL RASH, SWELLING OR PAIN  . UNUSUAL VAGINAL DISCHARGE OR ITCHING   Items with * indicate a potential emergency and should be followed up as soon as possible or go to the Emergency Department if any problems should occur.  Please show the CHEMOTHERAPY ALERT CARD or IMMUNOTHERAPY ALERT CARD at check-in to the  Emergency Department and triage nurse.  Should you have questions after your visit or need to cancel or reschedule your appointment, please contact [facility]  Dept: [phone]  and follow the prompts.  Office hours are 8:00 a.m. to 4:30 p.m. Monday - Friday. Please note that voicemails left after 4:00 p.m. may not be returned until the following business day.  We are closed weekends and major holidays. You have access to a nurse at all times for urgent questions. Please call the main number to the clinic Dept: [phone] and follow the prompts.   For any non-urgent questions, you may also contact your provider using MyChart. We now offer e-Visits for anyone 18 and older to request care online for non-urgent symptoms. For details visit [email or web address].   Also download the MyChart app! Go to the app store, search "MyChart", open the app, select [facility], and log in with your MyChart username and password.  Due to Covid, a mask is required upon entering the hospital/clinic. If you do not have a mask, one will be given to you upon arrival. For doctor visits, patients may have 1 support person aged 18 or older with them. For treatment visits, patients cannot have anyone with them due to current Covid guidelines and our immunocompromised population.   Iron Sucrose injection What is this medicine? IRON SUCROSE (AHY ern SOO krohs) is an iron complex. Iron is used to make healthy red blood cells, which carry oxygen and nutrients throughout the body. This medicine is used to treat iron deficiency   anemia in people with chronic kidney disease. This medicine may be used for other purposes; ask your health care provider or pharmacist if you have questions. COMMON BRAND NAME(S): Venofer What should I tell my health care provider before I take this medicine? They need to know if you have any of these conditions:  anemia not caused by low iron levels  heart  disease  high levels of iron in the blood  kidney disease  liver disease  an unusual or allergic reaction to iron, other medicines, foods, dyes, or preservatives  pregnant or trying to get pregnant  breast-feeding How should I use this medicine? This medicine is for infusion into a vein. It is given by a health care professional in a hospital or clinic setting. Talk to your pediatrician regarding the use of this medicine in children. While this drug may be prescribed for children as young as 2 years for selected conditions, precautions do apply. Overdosage: If you think you have taken too much of this medicine contact a poison control center or emergency room at once. NOTE: This medicine is only for you. Do not share this medicine with others. What if I miss a dose? It is important not to miss your dose. Call your doctor or health care professional if you are unable to keep an appointment. What may interact with this medicine? Do not take this medicine with any of the following medications:  deferoxamine  dimercaprol  other iron products This medicine may also interact with the following medications:  chloramphenicol  deferasirox This list may not describe all possible interactions. Give your health care provider a list of all the medicines, herbs, non-prescription drugs, or dietary supplements you use. Also tell them if you smoke, drink alcohol, or use illegal drugs. Some items may interact with your medicine. What should I watch for while using this medicine? Visit your doctor or healthcare professional regularly. Tell your doctor or healthcare professional if your symptoms do not start to get better or if they get worse. You may need blood work done while you are taking this medicine. You may need to follow a special diet. Talk to your doctor. Foods that contain iron include: whole grains/cereals, dried fruits, beans, or peas, leafy green vegetables, and organ meats (liver,  kidney). What side effects may I notice from receiving this medicine? Side effects that you should report to your doctor or health care professional as soon as possible:  allergic reactions like skin rash, itching or hives, swelling of the face, lips, or tongue  breathing problems  changes in blood pressure  cough  fast, irregular heartbeat  feeling faint or lightheaded, falls  fever or chills  flushing, sweating, or hot feelings  joint or muscle aches/pains  seizures  swelling of the ankles or feet  unusually weak or tired Side effects that usually do not require medical attention (report to your doctor or health care professional if they continue or are bothersome):  diarrhea  feeling achy  headache  irritation at site where injected  nausea, vomiting  stomach upset  tiredness This list may not describe all possible side effects. Call your doctor for medical advice about side effects. You may report side effects to FDA at 1-800-FDA-1088. Where should I keep my medicine? This drug is given in a hospital or clinic and will not be stored at home. NOTE: This sheet is a summary. It may not cover all possible information. If you have questions about this medicine, talk to your   doctor, pharmacist, or health care provider.  2021 Elsevier/Gold Standard (2011-06-22 17:14:35)   

## 2021-02-18 NOTE — Progress Notes (Signed)
Patient declined to stay for 30 minute observation.

## 2021-02-23 DIAGNOSIS — I10 Essential (primary) hypertension: Secondary | ICD-10-CM | POA: Diagnosis not present

## 2021-02-23 DIAGNOSIS — M79642 Pain in left hand: Secondary | ICD-10-CM | POA: Diagnosis not present

## 2021-02-23 DIAGNOSIS — M5137 Other intervertebral disc degeneration, lumbosacral region: Secondary | ICD-10-CM | POA: Diagnosis not present

## 2021-02-23 DIAGNOSIS — M542 Cervicalgia: Secondary | ICD-10-CM | POA: Diagnosis not present

## 2021-02-23 DIAGNOSIS — Z79899 Other long term (current) drug therapy: Secondary | ICD-10-CM | POA: Diagnosis not present

## 2021-02-23 DIAGNOSIS — M546 Pain in thoracic spine: Secondary | ICD-10-CM | POA: Diagnosis not present

## 2021-02-23 DIAGNOSIS — Z9181 History of falling: Secondary | ICD-10-CM | POA: Diagnosis not present

## 2021-02-23 DIAGNOSIS — M79641 Pain in right hand: Secondary | ICD-10-CM | POA: Diagnosis not present

## 2021-02-23 DIAGNOSIS — M545 Low back pain, unspecified: Secondary | ICD-10-CM | POA: Diagnosis not present

## 2021-02-25 ENCOUNTER — Other Ambulatory Visit: Payer: Self-pay

## 2021-02-25 ENCOUNTER — Inpatient Hospital Stay: Payer: Medicare HMO | Attending: Hematology and Oncology

## 2021-02-25 VITALS — BP 162/82 | HR 72 | Temp 98.3°F | Resp 20

## 2021-02-25 DIAGNOSIS — D509 Iron deficiency anemia, unspecified: Secondary | ICD-10-CM | POA: Insufficient documentation

## 2021-02-25 DIAGNOSIS — D500 Iron deficiency anemia secondary to blood loss (chronic): Secondary | ICD-10-CM

## 2021-02-25 MED ORDER — SODIUM CHLORIDE 0.9 % IV SOLN
200.0000 mg | Freq: Once | INTRAVENOUS | Status: AC
  Administered 2021-02-25: 200 mg via INTRAVENOUS
  Filled 2021-02-25: qty 200

## 2021-02-25 MED ORDER — SODIUM CHLORIDE 0.9 % IV SOLN
Freq: Once | INTRAVENOUS | Status: AC
  Filled 2021-02-25: qty 250

## 2021-02-25 NOTE — Patient Instructions (Signed)

## 2021-02-25 NOTE — Progress Notes (Signed)
Pt declined to stay full 30 minutes post IV Venofer observation. VSS, pt asymptomatic and discharged home in stable condition.

## 2021-03-04 ENCOUNTER — Inpatient Hospital Stay: Payer: Medicare HMO

## 2021-03-04 ENCOUNTER — Other Ambulatory Visit: Payer: Self-pay

## 2021-03-04 VITALS — BP 158/82 | HR 72 | Temp 98.2°F | Resp 18

## 2021-03-04 DIAGNOSIS — D500 Iron deficiency anemia secondary to blood loss (chronic): Secondary | ICD-10-CM

## 2021-03-04 DIAGNOSIS — D509 Iron deficiency anemia, unspecified: Secondary | ICD-10-CM | POA: Diagnosis not present

## 2021-03-04 MED ORDER — SODIUM CHLORIDE 0.9 % IV SOLN
Freq: Once | INTRAVENOUS | Status: DC
  Filled 2021-03-04: qty 250

## 2021-03-04 MED ORDER — SODIUM CHLORIDE 0.9 % IV SOLN
200.0000 mg | Freq: Once | INTRAVENOUS | Status: AC
  Administered 2021-03-04: 200 mg via INTRAVENOUS
  Filled 2021-03-04: qty 200

## 2021-03-11 ENCOUNTER — Ambulatory Visit: Payer: Medicare HMO | Admitting: Hematology and Oncology

## 2021-03-11 ENCOUNTER — Other Ambulatory Visit: Payer: Medicare HMO

## 2021-03-20 DIAGNOSIS — M5137 Other intervertebral disc degeneration, lumbosacral region: Secondary | ICD-10-CM | POA: Diagnosis not present

## 2021-03-20 DIAGNOSIS — Z79899 Other long term (current) drug therapy: Secondary | ICD-10-CM | POA: Diagnosis not present

## 2021-03-20 DIAGNOSIS — I10 Essential (primary) hypertension: Secondary | ICD-10-CM | POA: Diagnosis not present

## 2021-03-20 DIAGNOSIS — M545 Low back pain, unspecified: Secondary | ICD-10-CM | POA: Diagnosis not present

## 2021-03-20 DIAGNOSIS — Z9181 History of falling: Secondary | ICD-10-CM | POA: Diagnosis not present

## 2021-03-20 DIAGNOSIS — M79642 Pain in left hand: Secondary | ICD-10-CM | POA: Diagnosis not present

## 2021-03-20 DIAGNOSIS — M79641 Pain in right hand: Secondary | ICD-10-CM | POA: Diagnosis not present

## 2021-03-23 DIAGNOSIS — Z79899 Other long term (current) drug therapy: Secondary | ICD-10-CM | POA: Diagnosis not present

## 2021-03-31 ENCOUNTER — Other Ambulatory Visit: Payer: Self-pay | Admitting: Hematology and Oncology

## 2021-03-31 DIAGNOSIS — D500 Iron deficiency anemia secondary to blood loss (chronic): Secondary | ICD-10-CM

## 2021-04-01 ENCOUNTER — Inpatient Hospital Stay: Payer: Medicare HMO | Admitting: Hematology and Oncology

## 2021-04-01 ENCOUNTER — Inpatient Hospital Stay: Payer: Medicare HMO | Attending: Hematology and Oncology

## 2021-04-20 DIAGNOSIS — M79641 Pain in right hand: Secondary | ICD-10-CM | POA: Diagnosis not present

## 2021-04-20 DIAGNOSIS — Z79899 Other long term (current) drug therapy: Secondary | ICD-10-CM | POA: Diagnosis not present

## 2021-04-20 DIAGNOSIS — Z9181 History of falling: Secondary | ICD-10-CM | POA: Diagnosis not present

## 2021-04-20 DIAGNOSIS — M5137 Other intervertebral disc degeneration, lumbosacral region: Secondary | ICD-10-CM | POA: Diagnosis not present

## 2021-04-20 DIAGNOSIS — M545 Low back pain, unspecified: Secondary | ICD-10-CM | POA: Diagnosis not present

## 2021-04-20 DIAGNOSIS — I10 Essential (primary) hypertension: Secondary | ICD-10-CM | POA: Diagnosis not present

## 2021-04-20 DIAGNOSIS — M79642 Pain in left hand: Secondary | ICD-10-CM | POA: Diagnosis not present

## 2021-04-22 DIAGNOSIS — Z79899 Other long term (current) drug therapy: Secondary | ICD-10-CM | POA: Diagnosis not present

## 2021-05-23 DIAGNOSIS — Z9181 History of falling: Secondary | ICD-10-CM | POA: Diagnosis not present

## 2021-05-23 DIAGNOSIS — M545 Low back pain, unspecified: Secondary | ICD-10-CM | POA: Diagnosis not present

## 2021-05-23 DIAGNOSIS — I10 Essential (primary) hypertension: Secondary | ICD-10-CM | POA: Diagnosis not present

## 2021-05-23 DIAGNOSIS — M79642 Pain in left hand: Secondary | ICD-10-CM | POA: Diagnosis not present

## 2021-05-23 DIAGNOSIS — M5137 Other intervertebral disc degeneration, lumbosacral region: Secondary | ICD-10-CM | POA: Diagnosis not present

## 2021-05-23 DIAGNOSIS — E119 Type 2 diabetes mellitus without complications: Secondary | ICD-10-CM | POA: Diagnosis not present

## 2021-05-23 DIAGNOSIS — M79641 Pain in right hand: Secondary | ICD-10-CM | POA: Diagnosis not present

## 2021-05-23 DIAGNOSIS — Z79899 Other long term (current) drug therapy: Secondary | ICD-10-CM | POA: Diagnosis not present

## 2021-05-25 DIAGNOSIS — Z79899 Other long term (current) drug therapy: Secondary | ICD-10-CM | POA: Diagnosis not present

## 2021-06-22 DIAGNOSIS — E119 Type 2 diabetes mellitus without complications: Secondary | ICD-10-CM | POA: Diagnosis not present

## 2021-06-22 DIAGNOSIS — Z9181 History of falling: Secondary | ICD-10-CM | POA: Diagnosis not present

## 2021-06-22 DIAGNOSIS — I10 Essential (primary) hypertension: Secondary | ICD-10-CM | POA: Diagnosis not present

## 2021-06-22 DIAGNOSIS — Z79899 Other long term (current) drug therapy: Secondary | ICD-10-CM | POA: Diagnosis not present

## 2021-06-22 DIAGNOSIS — M79642 Pain in left hand: Secondary | ICD-10-CM | POA: Diagnosis not present

## 2021-06-22 DIAGNOSIS — M79641 Pain in right hand: Secondary | ICD-10-CM | POA: Diagnosis not present

## 2021-06-22 DIAGNOSIS — M5137 Other intervertebral disc degeneration, lumbosacral region: Secondary | ICD-10-CM | POA: Diagnosis not present

## 2021-06-22 DIAGNOSIS — Z0000 Encounter for general adult medical examination without abnormal findings: Secondary | ICD-10-CM | POA: Diagnosis not present

## 2021-06-24 DIAGNOSIS — Z79899 Other long term (current) drug therapy: Secondary | ICD-10-CM | POA: Diagnosis not present

## 2021-07-11 DIAGNOSIS — Z7689 Persons encountering health services in other specified circumstances: Secondary | ICD-10-CM | POA: Diagnosis not present

## 2021-07-11 DIAGNOSIS — E119 Type 2 diabetes mellitus without complications: Secondary | ICD-10-CM | POA: Diagnosis not present

## 2021-07-11 DIAGNOSIS — Z9181 History of falling: Secondary | ICD-10-CM | POA: Diagnosis not present

## 2021-07-11 DIAGNOSIS — M129 Arthropathy, unspecified: Secondary | ICD-10-CM | POA: Diagnosis not present

## 2021-07-11 DIAGNOSIS — D539 Nutritional anemia, unspecified: Secondary | ICD-10-CM | POA: Diagnosis not present

## 2021-07-11 DIAGNOSIS — R600 Localized edema: Secondary | ICD-10-CM | POA: Diagnosis not present

## 2021-07-11 DIAGNOSIS — K219 Gastro-esophageal reflux disease without esophagitis: Secondary | ICD-10-CM | POA: Diagnosis not present

## 2021-07-11 DIAGNOSIS — I10 Essential (primary) hypertension: Secondary | ICD-10-CM | POA: Diagnosis not present

## 2021-07-11 DIAGNOSIS — E559 Vitamin D deficiency, unspecified: Secondary | ICD-10-CM | POA: Diagnosis not present

## 2021-07-11 DIAGNOSIS — Z6841 Body Mass Index (BMI) 40.0 and over, adult: Secondary | ICD-10-CM | POA: Diagnosis not present

## 2021-07-11 DIAGNOSIS — Z79899 Other long term (current) drug therapy: Secondary | ICD-10-CM | POA: Diagnosis not present

## 2021-07-11 DIAGNOSIS — E7800 Pure hypercholesterolemia, unspecified: Secondary | ICD-10-CM | POA: Diagnosis not present

## 2021-07-12 DIAGNOSIS — E7800 Pure hypercholesterolemia, unspecified: Secondary | ICD-10-CM | POA: Diagnosis not present

## 2021-07-12 DIAGNOSIS — K219 Gastro-esophageal reflux disease without esophagitis: Secondary | ICD-10-CM | POA: Diagnosis not present

## 2021-07-12 DIAGNOSIS — R600 Localized edema: Secondary | ICD-10-CM | POA: Diagnosis not present

## 2021-07-12 DIAGNOSIS — I10 Essential (primary) hypertension: Secondary | ICD-10-CM | POA: Diagnosis not present

## 2021-07-12 DIAGNOSIS — D539 Nutritional anemia, unspecified: Secondary | ICD-10-CM | POA: Diagnosis not present

## 2021-07-12 DIAGNOSIS — E559 Vitamin D deficiency, unspecified: Secondary | ICD-10-CM | POA: Diagnosis not present

## 2021-07-22 DIAGNOSIS — M79641 Pain in right hand: Secondary | ICD-10-CM | POA: Diagnosis not present

## 2021-07-22 DIAGNOSIS — M5137 Other intervertebral disc degeneration, lumbosacral region: Secondary | ICD-10-CM | POA: Diagnosis not present

## 2021-07-22 DIAGNOSIS — M79642 Pain in left hand: Secondary | ICD-10-CM | POA: Diagnosis not present

## 2021-07-22 DIAGNOSIS — Z79899 Other long term (current) drug therapy: Secondary | ICD-10-CM | POA: Diagnosis not present

## 2021-07-22 DIAGNOSIS — I10 Essential (primary) hypertension: Secondary | ICD-10-CM | POA: Diagnosis not present

## 2021-07-22 DIAGNOSIS — E119 Type 2 diabetes mellitus without complications: Secondary | ICD-10-CM | POA: Diagnosis not present

## 2021-07-22 DIAGNOSIS — Z9181 History of falling: Secondary | ICD-10-CM | POA: Diagnosis not present

## 2021-07-22 DIAGNOSIS — R0602 Shortness of breath: Secondary | ICD-10-CM | POA: Diagnosis not present

## 2021-07-22 DIAGNOSIS — M545 Low back pain, unspecified: Secondary | ICD-10-CM | POA: Diagnosis not present

## 2021-07-27 DIAGNOSIS — F331 Major depressive disorder, recurrent, moderate: Secondary | ICD-10-CM | POA: Diagnosis not present

## 2021-07-27 DIAGNOSIS — F411 Generalized anxiety disorder: Secondary | ICD-10-CM | POA: Diagnosis not present

## 2021-07-27 DIAGNOSIS — R69 Illness, unspecified: Secondary | ICD-10-CM | POA: Diagnosis not present

## 2021-07-27 DIAGNOSIS — F410 Panic disorder [episodic paroxysmal anxiety] without agoraphobia: Secondary | ICD-10-CM | POA: Diagnosis not present

## 2021-07-27 DIAGNOSIS — F4310 Post-traumatic stress disorder, unspecified: Secondary | ICD-10-CM | POA: Diagnosis not present

## 2021-08-23 DIAGNOSIS — Z6841 Body Mass Index (BMI) 40.0 and over, adult: Secondary | ICD-10-CM | POA: Diagnosis not present

## 2021-08-23 DIAGNOSIS — M545 Low back pain, unspecified: Secondary | ICD-10-CM | POA: Diagnosis not present

## 2021-08-23 DIAGNOSIS — Z79899 Other long term (current) drug therapy: Secondary | ICD-10-CM | POA: Diagnosis not present

## 2021-08-23 DIAGNOSIS — E119 Type 2 diabetes mellitus without complications: Secondary | ICD-10-CM | POA: Diagnosis not present

## 2021-09-21 DIAGNOSIS — M79641 Pain in right hand: Secondary | ICD-10-CM | POA: Diagnosis not present

## 2021-09-21 DIAGNOSIS — Z9181 History of falling: Secondary | ICD-10-CM | POA: Diagnosis not present

## 2021-09-21 DIAGNOSIS — I10 Essential (primary) hypertension: Secondary | ICD-10-CM | POA: Diagnosis not present

## 2021-09-21 DIAGNOSIS — E119 Type 2 diabetes mellitus without complications: Secondary | ICD-10-CM | POA: Diagnosis not present

## 2021-09-21 DIAGNOSIS — M79642 Pain in left hand: Secondary | ICD-10-CM | POA: Diagnosis not present

## 2021-09-21 DIAGNOSIS — M545 Low back pain, unspecified: Secondary | ICD-10-CM | POA: Diagnosis not present

## 2021-09-21 DIAGNOSIS — Z79899 Other long term (current) drug therapy: Secondary | ICD-10-CM | POA: Diagnosis not present

## 2021-09-21 DIAGNOSIS — M5137 Other intervertebral disc degeneration, lumbosacral region: Secondary | ICD-10-CM | POA: Diagnosis not present

## 2021-09-23 DIAGNOSIS — Z79899 Other long term (current) drug therapy: Secondary | ICD-10-CM | POA: Diagnosis not present

## 2021-10-17 DIAGNOSIS — Z0000 Encounter for general adult medical examination without abnormal findings: Secondary | ICD-10-CM | POA: Diagnosis not present

## 2021-10-17 DIAGNOSIS — K219 Gastro-esophageal reflux disease without esophagitis: Secondary | ICD-10-CM | POA: Diagnosis not present

## 2021-10-17 DIAGNOSIS — R5383 Other fatigue: Secondary | ICD-10-CM | POA: Diagnosis not present

## 2021-10-17 DIAGNOSIS — E7800 Pure hypercholesterolemia, unspecified: Secondary | ICD-10-CM | POA: Diagnosis not present

## 2021-10-17 DIAGNOSIS — Z79899 Other long term (current) drug therapy: Secondary | ICD-10-CM | POA: Diagnosis not present

## 2021-10-17 DIAGNOSIS — Z1159 Encounter for screening for other viral diseases: Secondary | ICD-10-CM | POA: Diagnosis not present

## 2021-10-17 DIAGNOSIS — E559 Vitamin D deficiency, unspecified: Secondary | ICD-10-CM | POA: Diagnosis not present

## 2021-10-17 DIAGNOSIS — I10 Essential (primary) hypertension: Secondary | ICD-10-CM | POA: Diagnosis not present

## 2021-10-17 DIAGNOSIS — Z6841 Body Mass Index (BMI) 40.0 and over, adult: Secondary | ICD-10-CM | POA: Diagnosis not present

## 2021-10-17 DIAGNOSIS — E119 Type 2 diabetes mellitus without complications: Secondary | ICD-10-CM | POA: Diagnosis not present

## 2021-10-17 DIAGNOSIS — R69 Illness, unspecified: Secondary | ICD-10-CM | POA: Diagnosis not present

## 2021-10-17 DIAGNOSIS — Z9181 History of falling: Secondary | ICD-10-CM | POA: Diagnosis not present

## 2021-10-17 DIAGNOSIS — F331 Major depressive disorder, recurrent, moderate: Secondary | ICD-10-CM | POA: Diagnosis not present

## 2021-10-17 DIAGNOSIS — M129 Arthropathy, unspecified: Secondary | ICD-10-CM | POA: Diagnosis not present

## 2021-10-17 DIAGNOSIS — R600 Localized edema: Secondary | ICD-10-CM | POA: Diagnosis not present

## 2021-10-17 DIAGNOSIS — D539 Nutritional anemia, unspecified: Secondary | ICD-10-CM | POA: Diagnosis not present

## 2021-10-21 DIAGNOSIS — M545 Low back pain, unspecified: Secondary | ICD-10-CM | POA: Diagnosis not present

## 2021-10-21 DIAGNOSIS — M79642 Pain in left hand: Secondary | ICD-10-CM | POA: Diagnosis not present

## 2021-10-21 DIAGNOSIS — E119 Type 2 diabetes mellitus without complications: Secondary | ICD-10-CM | POA: Diagnosis not present

## 2021-10-21 DIAGNOSIS — I10 Essential (primary) hypertension: Secondary | ICD-10-CM | POA: Diagnosis not present

## 2021-10-21 DIAGNOSIS — M79641 Pain in right hand: Secondary | ICD-10-CM | POA: Diagnosis not present

## 2021-10-21 DIAGNOSIS — Z9181 History of falling: Secondary | ICD-10-CM | POA: Diagnosis not present

## 2021-10-21 DIAGNOSIS — M5137 Other intervertebral disc degeneration, lumbosacral region: Secondary | ICD-10-CM | POA: Diagnosis not present

## 2021-10-21 DIAGNOSIS — Z79899 Other long term (current) drug therapy: Secondary | ICD-10-CM | POA: Diagnosis not present

## 2021-10-25 DIAGNOSIS — Z79899 Other long term (current) drug therapy: Secondary | ICD-10-CM | POA: Diagnosis not present

## 2021-11-21 DIAGNOSIS — M545 Low back pain, unspecified: Secondary | ICD-10-CM | POA: Diagnosis not present

## 2021-11-21 DIAGNOSIS — E119 Type 2 diabetes mellitus without complications: Secondary | ICD-10-CM | POA: Diagnosis not present

## 2021-11-21 DIAGNOSIS — F331 Major depressive disorder, recurrent, moderate: Secondary | ICD-10-CM | POA: Diagnosis not present

## 2021-11-21 DIAGNOSIS — R69 Illness, unspecified: Secondary | ICD-10-CM | POA: Diagnosis not present

## 2021-11-21 DIAGNOSIS — Z9181 History of falling: Secondary | ICD-10-CM | POA: Diagnosis not present

## 2021-11-21 DIAGNOSIS — M79642 Pain in left hand: Secondary | ICD-10-CM | POA: Diagnosis not present

## 2021-11-21 DIAGNOSIS — I10 Essential (primary) hypertension: Secondary | ICD-10-CM | POA: Diagnosis not present

## 2021-11-21 DIAGNOSIS — M5137 Other intervertebral disc degeneration, lumbosacral region: Secondary | ICD-10-CM | POA: Diagnosis not present

## 2021-11-21 DIAGNOSIS — M79641 Pain in right hand: Secondary | ICD-10-CM | POA: Diagnosis not present

## 2021-11-21 DIAGNOSIS — Z79899 Other long term (current) drug therapy: Secondary | ICD-10-CM | POA: Diagnosis not present

## 2021-11-23 DIAGNOSIS — Z79899 Other long term (current) drug therapy: Secondary | ICD-10-CM | POA: Diagnosis not present

## 2021-12-19 DIAGNOSIS — I10 Essential (primary) hypertension: Secondary | ICD-10-CM | POA: Diagnosis not present

## 2021-12-19 DIAGNOSIS — M79642 Pain in left hand: Secondary | ICD-10-CM | POA: Diagnosis not present

## 2021-12-19 DIAGNOSIS — Z9181 History of falling: Secondary | ICD-10-CM | POA: Diagnosis not present

## 2021-12-19 DIAGNOSIS — M545 Low back pain, unspecified: Secondary | ICD-10-CM | POA: Diagnosis not present

## 2021-12-19 DIAGNOSIS — Z79899 Other long term (current) drug therapy: Secondary | ICD-10-CM | POA: Diagnosis not present

## 2021-12-19 DIAGNOSIS — M79641 Pain in right hand: Secondary | ICD-10-CM | POA: Diagnosis not present

## 2021-12-19 DIAGNOSIS — M5137 Other intervertebral disc degeneration, lumbosacral region: Secondary | ICD-10-CM | POA: Diagnosis not present

## 2021-12-19 DIAGNOSIS — E119 Type 2 diabetes mellitus without complications: Secondary | ICD-10-CM | POA: Diagnosis not present

## 2021-12-22 DIAGNOSIS — Z79899 Other long term (current) drug therapy: Secondary | ICD-10-CM | POA: Diagnosis not present

## 2022-01-11 DIAGNOSIS — M79641 Pain in right hand: Secondary | ICD-10-CM | POA: Diagnosis not present

## 2022-01-11 DIAGNOSIS — Z9181 History of falling: Secondary | ICD-10-CM | POA: Diagnosis not present

## 2022-01-11 DIAGNOSIS — Z79899 Other long term (current) drug therapy: Secondary | ICD-10-CM | POA: Diagnosis not present

## 2022-01-11 DIAGNOSIS — I10 Essential (primary) hypertension: Secondary | ICD-10-CM | POA: Diagnosis not present

## 2022-01-11 DIAGNOSIS — M545 Low back pain, unspecified: Secondary | ICD-10-CM | POA: Diagnosis not present

## 2022-01-11 DIAGNOSIS — E119 Type 2 diabetes mellitus without complications: Secondary | ICD-10-CM | POA: Diagnosis not present

## 2022-01-11 DIAGNOSIS — M5137 Other intervertebral disc degeneration, lumbosacral region: Secondary | ICD-10-CM | POA: Diagnosis not present

## 2022-01-11 DIAGNOSIS — R69 Illness, unspecified: Secondary | ICD-10-CM | POA: Diagnosis not present

## 2022-01-11 DIAGNOSIS — F331 Major depressive disorder, recurrent, moderate: Secondary | ICD-10-CM | POA: Diagnosis not present

## 2022-01-11 DIAGNOSIS — M79642 Pain in left hand: Secondary | ICD-10-CM | POA: Diagnosis not present

## 2022-01-13 DIAGNOSIS — Z79899 Other long term (current) drug therapy: Secondary | ICD-10-CM | POA: Diagnosis not present

## 2022-01-23 DIAGNOSIS — E7800 Pure hypercholesterolemia, unspecified: Secondary | ICD-10-CM | POA: Diagnosis not present

## 2022-01-23 DIAGNOSIS — D539 Nutritional anemia, unspecified: Secondary | ICD-10-CM | POA: Diagnosis not present

## 2022-01-23 DIAGNOSIS — F331 Major depressive disorder, recurrent, moderate: Secondary | ICD-10-CM | POA: Diagnosis not present

## 2022-01-23 DIAGNOSIS — M129 Arthropathy, unspecified: Secondary | ICD-10-CM | POA: Diagnosis not present

## 2022-01-23 DIAGNOSIS — E119 Type 2 diabetes mellitus without complications: Secondary | ICD-10-CM | POA: Diagnosis not present

## 2022-01-23 DIAGNOSIS — R69 Illness, unspecified: Secondary | ICD-10-CM | POA: Diagnosis not present

## 2022-01-23 DIAGNOSIS — Z79899 Other long term (current) drug therapy: Secondary | ICD-10-CM | POA: Diagnosis not present

## 2022-01-23 DIAGNOSIS — Z6841 Body Mass Index (BMI) 40.0 and over, adult: Secondary | ICD-10-CM | POA: Diagnosis not present

## 2022-01-23 DIAGNOSIS — R600 Localized edema: Secondary | ICD-10-CM | POA: Diagnosis not present

## 2022-01-23 DIAGNOSIS — Z9181 History of falling: Secondary | ICD-10-CM | POA: Diagnosis not present

## 2022-01-23 DIAGNOSIS — I10 Essential (primary) hypertension: Secondary | ICD-10-CM | POA: Diagnosis not present

## 2022-01-23 DIAGNOSIS — Z1159 Encounter for screening for other viral diseases: Secondary | ICD-10-CM | POA: Diagnosis not present

## 2022-01-23 DIAGNOSIS — E559 Vitamin D deficiency, unspecified: Secondary | ICD-10-CM | POA: Diagnosis not present

## 2022-01-23 DIAGNOSIS — R5383 Other fatigue: Secondary | ICD-10-CM | POA: Diagnosis not present

## 2022-02-08 DIAGNOSIS — Z79899 Other long term (current) drug therapy: Secondary | ICD-10-CM | POA: Diagnosis not present

## 2022-02-08 DIAGNOSIS — M5137 Other intervertebral disc degeneration, lumbosacral region: Secondary | ICD-10-CM | POA: Diagnosis not present

## 2022-02-08 DIAGNOSIS — M545 Low back pain, unspecified: Secondary | ICD-10-CM | POA: Diagnosis not present

## 2022-02-08 DIAGNOSIS — M79641 Pain in right hand: Secondary | ICD-10-CM | POA: Diagnosis not present

## 2022-02-08 DIAGNOSIS — M79642 Pain in left hand: Secondary | ICD-10-CM | POA: Diagnosis not present

## 2022-02-08 DIAGNOSIS — Z9181 History of falling: Secondary | ICD-10-CM | POA: Diagnosis not present

## 2022-02-14 DIAGNOSIS — Z79899 Other long term (current) drug therapy: Secondary | ICD-10-CM | POA: Diagnosis not present

## 2022-03-08 DIAGNOSIS — M79641 Pain in right hand: Secondary | ICD-10-CM | POA: Diagnosis not present

## 2022-03-08 DIAGNOSIS — R69 Illness, unspecified: Secondary | ICD-10-CM | POA: Diagnosis not present

## 2022-03-08 DIAGNOSIS — M5137 Other intervertebral disc degeneration, lumbosacral region: Secondary | ICD-10-CM | POA: Diagnosis not present

## 2022-03-08 DIAGNOSIS — F331 Major depressive disorder, recurrent, moderate: Secondary | ICD-10-CM | POA: Diagnosis not present

## 2022-03-08 DIAGNOSIS — Z9181 History of falling: Secondary | ICD-10-CM | POA: Diagnosis not present

## 2022-03-08 DIAGNOSIS — E119 Type 2 diabetes mellitus without complications: Secondary | ICD-10-CM | POA: Diagnosis not present

## 2022-03-08 DIAGNOSIS — M25562 Pain in left knee: Secondary | ICD-10-CM | POA: Diagnosis not present

## 2022-03-08 DIAGNOSIS — M79642 Pain in left hand: Secondary | ICD-10-CM | POA: Diagnosis not present

## 2022-03-08 DIAGNOSIS — I10 Essential (primary) hypertension: Secondary | ICD-10-CM | POA: Diagnosis not present

## 2022-03-08 DIAGNOSIS — Z79899 Other long term (current) drug therapy: Secondary | ICD-10-CM | POA: Diagnosis not present

## 2022-03-10 DIAGNOSIS — Z79899 Other long term (current) drug therapy: Secondary | ICD-10-CM | POA: Diagnosis not present

## 2022-04-07 DIAGNOSIS — M79642 Pain in left hand: Secondary | ICD-10-CM | POA: Diagnosis not present

## 2022-04-07 DIAGNOSIS — M545 Low back pain, unspecified: Secondary | ICD-10-CM | POA: Diagnosis not present

## 2022-04-07 DIAGNOSIS — Z9181 History of falling: Secondary | ICD-10-CM | POA: Diagnosis not present

## 2022-04-07 DIAGNOSIS — M79641 Pain in right hand: Secondary | ICD-10-CM | POA: Diagnosis not present

## 2022-04-07 DIAGNOSIS — R69 Illness, unspecified: Secondary | ICD-10-CM | POA: Diagnosis not present

## 2022-04-07 DIAGNOSIS — M25562 Pain in left knee: Secondary | ICD-10-CM | POA: Diagnosis not present

## 2022-04-07 DIAGNOSIS — I10 Essential (primary) hypertension: Secondary | ICD-10-CM | POA: Diagnosis not present

## 2022-04-07 DIAGNOSIS — M5137 Other intervertebral disc degeneration, lumbosacral region: Secondary | ICD-10-CM | POA: Diagnosis not present

## 2022-04-07 DIAGNOSIS — F331 Major depressive disorder, recurrent, moderate: Secondary | ICD-10-CM | POA: Diagnosis not present

## 2022-04-07 DIAGNOSIS — G894 Chronic pain syndrome: Secondary | ICD-10-CM | POA: Diagnosis not present

## 2022-04-07 DIAGNOSIS — E119 Type 2 diabetes mellitus without complications: Secondary | ICD-10-CM | POA: Diagnosis not present

## 2022-04-07 DIAGNOSIS — Z79899 Other long term (current) drug therapy: Secondary | ICD-10-CM | POA: Diagnosis not present

## 2022-04-07 DIAGNOSIS — Z79891 Long term (current) use of opiate analgesic: Secondary | ICD-10-CM | POA: Diagnosis not present

## 2022-04-10 DIAGNOSIS — Z79899 Other long term (current) drug therapy: Secondary | ICD-10-CM | POA: Diagnosis not present

## 2022-05-08 DIAGNOSIS — G894 Chronic pain syndrome: Secondary | ICD-10-CM | POA: Diagnosis not present

## 2022-05-08 DIAGNOSIS — Z79899 Other long term (current) drug therapy: Secondary | ICD-10-CM | POA: Diagnosis not present

## 2022-05-08 DIAGNOSIS — Z9181 History of falling: Secondary | ICD-10-CM | POA: Diagnosis not present

## 2022-05-08 DIAGNOSIS — M25562 Pain in left knee: Secondary | ICD-10-CM | POA: Diagnosis not present

## 2022-05-08 DIAGNOSIS — M545 Low back pain, unspecified: Secondary | ICD-10-CM | POA: Diagnosis not present

## 2022-05-08 DIAGNOSIS — M79641 Pain in right hand: Secondary | ICD-10-CM | POA: Diagnosis not present

## 2022-05-08 DIAGNOSIS — I10 Essential (primary) hypertension: Secondary | ICD-10-CM | POA: Diagnosis not present

## 2022-05-08 DIAGNOSIS — E119 Type 2 diabetes mellitus without complications: Secondary | ICD-10-CM | POA: Diagnosis not present

## 2022-05-08 DIAGNOSIS — R600 Localized edema: Secondary | ICD-10-CM | POA: Diagnosis not present

## 2022-05-08 DIAGNOSIS — R69 Illness, unspecified: Secondary | ICD-10-CM | POA: Diagnosis not present

## 2022-05-08 DIAGNOSIS — M79642 Pain in left hand: Secondary | ICD-10-CM | POA: Diagnosis not present

## 2022-05-08 DIAGNOSIS — M5137 Other intervertebral disc degeneration, lumbosacral region: Secondary | ICD-10-CM | POA: Diagnosis not present

## 2022-05-08 DIAGNOSIS — F331 Major depressive disorder, recurrent, moderate: Secondary | ICD-10-CM | POA: Diagnosis not present

## 2022-05-10 DIAGNOSIS — Z79899 Other long term (current) drug therapy: Secondary | ICD-10-CM | POA: Diagnosis not present

## 2022-06-06 DIAGNOSIS — I10 Essential (primary) hypertension: Secondary | ICD-10-CM | POA: Diagnosis not present

## 2022-06-06 DIAGNOSIS — R600 Localized edema: Secondary | ICD-10-CM | POA: Diagnosis not present

## 2022-06-06 DIAGNOSIS — M545 Low back pain, unspecified: Secondary | ICD-10-CM | POA: Diagnosis not present

## 2022-06-06 DIAGNOSIS — G894 Chronic pain syndrome: Secondary | ICD-10-CM | POA: Diagnosis not present

## 2022-06-06 DIAGNOSIS — E119 Type 2 diabetes mellitus without complications: Secondary | ICD-10-CM | POA: Diagnosis not present

## 2022-06-06 DIAGNOSIS — R69 Illness, unspecified: Secondary | ICD-10-CM | POA: Diagnosis not present

## 2022-06-06 DIAGNOSIS — F331 Major depressive disorder, recurrent, moderate: Secondary | ICD-10-CM | POA: Diagnosis not present

## 2022-06-06 DIAGNOSIS — Z9181 History of falling: Secondary | ICD-10-CM | POA: Diagnosis not present

## 2022-06-06 DIAGNOSIS — M5137 Other intervertebral disc degeneration, lumbosacral region: Secondary | ICD-10-CM | POA: Diagnosis not present

## 2022-06-06 DIAGNOSIS — F419 Anxiety disorder, unspecified: Secondary | ICD-10-CM | POA: Diagnosis not present

## 2022-06-06 DIAGNOSIS — Z79899 Other long term (current) drug therapy: Secondary | ICD-10-CM | POA: Diagnosis not present

## 2022-06-06 DIAGNOSIS — K5900 Constipation, unspecified: Secondary | ICD-10-CM | POA: Diagnosis not present

## 2022-06-06 DIAGNOSIS — E559 Vitamin D deficiency, unspecified: Secondary | ICD-10-CM | POA: Diagnosis not present

## 2022-06-09 DIAGNOSIS — Z79899 Other long term (current) drug therapy: Secondary | ICD-10-CM | POA: Diagnosis not present

## 2022-07-03 DIAGNOSIS — R600 Localized edema: Secondary | ICD-10-CM | POA: Diagnosis not present

## 2022-07-03 DIAGNOSIS — I10 Essential (primary) hypertension: Secondary | ICD-10-CM | POA: Diagnosis not present

## 2022-07-03 DIAGNOSIS — Z6841 Body Mass Index (BMI) 40.0 and over, adult: Secondary | ICD-10-CM | POA: Diagnosis not present

## 2022-07-03 DIAGNOSIS — R030 Elevated blood-pressure reading, without diagnosis of hypertension: Secondary | ICD-10-CM | POA: Diagnosis not present

## 2022-07-03 DIAGNOSIS — E7800 Pure hypercholesterolemia, unspecified: Secondary | ICD-10-CM | POA: Diagnosis not present

## 2022-07-04 DIAGNOSIS — R030 Elevated blood-pressure reading, without diagnosis of hypertension: Secondary | ICD-10-CM | POA: Diagnosis not present

## 2022-07-04 DIAGNOSIS — R7309 Other abnormal glucose: Secondary | ICD-10-CM | POA: Diagnosis not present

## 2022-07-04 DIAGNOSIS — M5137 Other intervertebral disc degeneration, lumbosacral region: Secondary | ICD-10-CM | POA: Diagnosis not present

## 2022-07-04 DIAGNOSIS — M545 Low back pain, unspecified: Secondary | ICD-10-CM | POA: Diagnosis not present

## 2022-07-04 DIAGNOSIS — E119 Type 2 diabetes mellitus without complications: Secondary | ICD-10-CM | POA: Diagnosis not present

## 2022-07-04 DIAGNOSIS — Z6841 Body Mass Index (BMI) 40.0 and over, adult: Secondary | ICD-10-CM | POA: Diagnosis not present

## 2022-07-04 DIAGNOSIS — G894 Chronic pain syndrome: Secondary | ICD-10-CM | POA: Diagnosis not present

## 2022-07-04 DIAGNOSIS — Z79899 Other long term (current) drug therapy: Secondary | ICD-10-CM | POA: Diagnosis not present

## 2022-07-06 DIAGNOSIS — Z79899 Other long term (current) drug therapy: Secondary | ICD-10-CM | POA: Diagnosis not present

## 2022-08-02 DIAGNOSIS — F419 Anxiety disorder, unspecified: Secondary | ICD-10-CM | POA: Diagnosis not present

## 2022-08-02 DIAGNOSIS — E559 Vitamin D deficiency, unspecified: Secondary | ICD-10-CM | POA: Diagnosis not present

## 2022-08-02 DIAGNOSIS — K5900 Constipation, unspecified: Secondary | ICD-10-CM | POA: Diagnosis not present

## 2022-08-02 DIAGNOSIS — R600 Localized edema: Secondary | ICD-10-CM | POA: Diagnosis not present

## 2022-08-02 DIAGNOSIS — M5137 Other intervertebral disc degeneration, lumbosacral region: Secondary | ICD-10-CM | POA: Diagnosis not present

## 2022-08-02 DIAGNOSIS — I10 Essential (primary) hypertension: Secondary | ICD-10-CM | POA: Diagnosis not present

## 2022-08-02 DIAGNOSIS — F331 Major depressive disorder, recurrent, moderate: Secondary | ICD-10-CM | POA: Diagnosis not present

## 2022-08-02 DIAGNOSIS — R69 Illness, unspecified: Secondary | ICD-10-CM | POA: Diagnosis not present

## 2022-08-02 DIAGNOSIS — M545 Low back pain, unspecified: Secondary | ICD-10-CM | POA: Diagnosis not present

## 2022-08-02 DIAGNOSIS — Z9181 History of falling: Secondary | ICD-10-CM | POA: Diagnosis not present

## 2022-08-02 DIAGNOSIS — Z79899 Other long term (current) drug therapy: Secondary | ICD-10-CM | POA: Diagnosis not present

## 2022-08-02 DIAGNOSIS — G894 Chronic pain syndrome: Secondary | ICD-10-CM | POA: Diagnosis not present

## 2022-08-02 DIAGNOSIS — E119 Type 2 diabetes mellitus without complications: Secondary | ICD-10-CM | POA: Diagnosis not present

## 2022-08-04 DIAGNOSIS — Z79899 Other long term (current) drug therapy: Secondary | ICD-10-CM | POA: Diagnosis not present

## 2022-08-18 ENCOUNTER — Emergency Department (HOSPITAL_COMMUNITY): Payer: Medicare HMO

## 2022-08-18 ENCOUNTER — Other Ambulatory Visit: Payer: Self-pay

## 2022-08-18 ENCOUNTER — Emergency Department (EMERGENCY_DEPARTMENT_HOSPITAL): Payer: Medicare HMO

## 2022-08-18 ENCOUNTER — Encounter (HOSPITAL_COMMUNITY): Payer: Self-pay

## 2022-08-18 ENCOUNTER — Emergency Department (HOSPITAL_COMMUNITY)
Admission: EM | Admit: 2022-08-18 | Discharge: 2022-08-18 | Disposition: A | Discharge status: Home / Self Care | Payer: Medicare HMO | Attending: Emergency Medicine | Admitting: Emergency Medicine

## 2022-08-18 DIAGNOSIS — I2699 Other pulmonary embolism without acute cor pulmonale: Secondary | ICD-10-CM | POA: Diagnosis not present

## 2022-08-18 DIAGNOSIS — R000 Tachycardia, unspecified: Secondary | ICD-10-CM | POA: Diagnosis not present

## 2022-08-18 DIAGNOSIS — L03116 Cellulitis of left lower limb: Secondary | ICD-10-CM | POA: Insufficient documentation

## 2022-08-18 DIAGNOSIS — I110 Hypertensive heart disease with heart failure: Secondary | ICD-10-CM | POA: Insufficient documentation

## 2022-08-18 DIAGNOSIS — I5032 Chronic diastolic (congestive) heart failure: Secondary | ICD-10-CM | POA: Insufficient documentation

## 2022-08-18 DIAGNOSIS — M79662 Pain in left lower leg: Secondary | ICD-10-CM

## 2022-08-18 DIAGNOSIS — R079 Chest pain, unspecified: Secondary | ICD-10-CM | POA: Diagnosis not present

## 2022-08-18 DIAGNOSIS — R0602 Shortness of breath: Secondary | ICD-10-CM | POA: Diagnosis not present

## 2022-08-18 LAB — CBC WITH DIFFERENTIAL/PLATELET
Abs Immature Granulocytes: 0.03 10*3/uL (ref 0.00–0.07)
Basophils Absolute: 0 10*3/uL (ref 0.0–0.1)
Basophils Relative: 0 %
Eosinophils Absolute: 0.2 10*3/uL (ref 0.0–0.5)
Eosinophils Relative: 2 %
HCT: 30.3 % — ABNORMAL LOW (ref 36.0–46.0)
Hemoglobin: 9.5 g/dL — ABNORMAL LOW (ref 12.0–15.0)
Immature Granulocytes: 0 %
Lymphocytes Relative: 21 %
Lymphs Abs: 1.9 10*3/uL (ref 0.7–4.0)
MCH: 27.8 pg (ref 26.0–34.0)
MCHC: 31.4 g/dL (ref 30.0–36.0)
MCV: 88.6 fL (ref 80.0–100.0)
Monocytes Absolute: 0.9 10*3/uL (ref 0.1–1.0)
Monocytes Relative: 9 %
Neutro Abs: 6.3 10*3/uL (ref 1.7–7.7)
Neutrophils Relative %: 68 %
Platelets: 304 10*3/uL (ref 150–400)
RBC: 3.42 MIL/uL — ABNORMAL LOW (ref 3.87–5.11)
RDW: 13.6 % (ref 11.5–15.5)
WBC: 9.3 10*3/uL (ref 4.0–10.5)
nRBC: 0 % (ref 0.0–0.2)

## 2022-08-18 LAB — COMPREHENSIVE METABOLIC PANEL
ALT: 13 U/L (ref 0–44)
AST: 18 U/L (ref 15–41)
Albumin: 2.9 g/dL — ABNORMAL LOW (ref 3.5–5.0)
Alkaline Phosphatase: 98 U/L (ref 38–126)
Anion gap: 11 (ref 5–15)
BUN: 10 mg/dL (ref 6–20)
CO2: 27 mmol/L (ref 22–32)
Calcium: 9 mg/dL (ref 8.9–10.3)
Chloride: 104 mmol/L (ref 98–111)
Creatinine, Ser: 0.79 mg/dL (ref 0.44–1.00)
GFR, Estimated: >60 mL/min (ref >60)
Glucose, Bld: 116 mg/dL — ABNORMAL HIGH (ref 70–99)
Potassium: 2.9 mmol/L — ABNORMAL LOW (ref 3.5–5.1)
Sodium: 142 mmol/L (ref 135–145)
Total Bilirubin: 0.5 mg/dL (ref 0.3–1.2)
Total Protein: 7.5 g/dL (ref 6.5–8.1)

## 2022-08-18 LAB — PROTIME-INR
INR: 1.2 (ref 0.8–1.2)
Prothrombin Time: 15.2 seconds (ref 11.4–15.2)

## 2022-08-18 LAB — BRAIN NATRIURETIC PEPTIDE: B Natriuretic Peptide: 56.9 pg/mL (ref 0.0–100.0)

## 2022-08-18 LAB — TROPONIN I (HIGH SENSITIVITY): Troponin I (High Sensitivity): 15 ng/L (ref <18)

## 2022-08-18 MED ORDER — IOHEXOL 350 MG/ML SOLN
75.0000 mL | Freq: Once | INTRAVENOUS | Status: AC | PRN
  Administered 2022-08-18: 75 mL via INTRAVENOUS

## 2022-08-18 MED ORDER — POTASSIUM CHLORIDE CRYS ER 20 MEQ PO TBCR
40.0000 meq | EXTENDED_RELEASE_TABLET | Freq: Once | ORAL | Status: AC
  Administered 2022-08-18: 40 meq via ORAL
  Filled 2022-08-18: qty 2

## 2022-08-18 MED ORDER — DEXTROSE 5 % IV SOLN
1500.0000 mg | Freq: Once | INTRAVENOUS | Status: AC
  Administered 2022-08-18: 1500 mg via INTRAVENOUS
  Filled 2022-08-18: qty 75

## 2022-08-18 MED ORDER — ALBUTEROL SULFATE HFA 108 (90 BASE) MCG/ACT IN AERS: 2.0000 | INHALATION_SPRAY | RESPIRATORY_TRACT | Status: DC | PRN

## 2022-08-18 NOTE — Progress Notes (Signed)
Pharmacy Note:  [facility] for Acute Bacterial Skin and Skin Structure Infection (ABSSSI) Patients to [facility] Discharge [name] is an 55 y.o. female who presented to [facility] on 08/18/2022 with an Acute Bacterial Skin and Skin Structure Infection  Inclusion criteria - Indication [x]  Moderately large skin lesion (>=75 cm2 or larger - about the size of a baseball) []  Cellulitis  D/w provider - due to size of swelling/redness felt better candidate for IV abx as opposed to PO options.   Inclusion Criteria - at least one SIRS criteria present []  WBC > 12,000 or < 4000 []  temp >100.9 or < 96.8 [x]  heart rate >90[]  respiratory rate >20  Patient was evaluated for the following exclusion criteria and no exclusions were found  Hardware involvement, Hypotension / shock, Elevated lactate (>2) without other explanation, ram-negative infection risk factors (bites, water exposure, infection after trauma, infection after skin graft, neutropenia, burns, severe immunocompromise), necrotizing fasciitis possible or confirmed, Known or suspected osteomyelitis or septic arthritis, endocarditis, diabetic foot infection, ischemic ulcers, post-operative wound infection, perirectal infections, need for drainage in the operating room, hand or facial infections, injection drug users with a fever, bacteremia, pregnancy or breastfeeding, allergy to related antibiotics like vancomycin, known liver disease (t.bili >2x ULN or AST/ALT 3x ULN)  , PharmD Clinical Pharmacist 08/18/2022 2:52 PM

## 2022-08-18 NOTE — Progress Notes (Signed)
Lower extremity venous has been completed.   Preliminary results in CV Proc.   [name] Yashira Offenberger 08/18/2022 1:28 PM

## 2022-08-18 NOTE — ED Notes (Signed)
Got patient on the monitor got patient a warm blanket patient is resting with family at bedside and call bell in reach

## 2022-08-18 NOTE — Discharge Instructions (Signed)
Your examination shows signs of cellulitis, a skin infection.  We did not see a blood clot in your lungs or any leg on the CT scan or the ultrasound.  We have given you an IV medicine which last for around 2 weeks to treat your cellulitis.  You may be contacted to follow-up with the infectious disease clinic.  Please return to the emergency department if your symptoms worsen, or you develop any fevers or chills, increasing leg pain or swelling, chest pain or difficulty breathing, or any other concerning symptoms.

## 2022-08-18 NOTE — ED Triage Notes (Addendum)
Pt arrived POV from home c/o left leg pain x1 week and then last night started having SHOB. Pt states nothing over the counter is helping the pain. 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

## 2022-08-18 NOTE — ED Provider Notes (Signed)
[facility] Provider Note  CSN: [id number] Arrival date & time: 08/18/22 1106  Chief Complaint(s) Shortness of Breath and Leg Pain  HPI [name] is a 56 y.o. female with history of CHF, DVT, obesity, presenting to the emergency department with leg pain.  Patient reports 1 week of left leg pain.  She reports that she also has redness and warmth in her left leg.  She denies any trauma.  She denies any recent travel or surgeries.  She is not currently taking anticoagulation.  She also reports mild shortness of breath starting yesterday, no chest pain.  She reports that shortness of breath is worse with lying flat.  No fevers, chills, cough.   Past Medical History Past Medical History:  Diagnosis Date   CHF (congestive heart failure) ([place])    DVT (deep venous thrombosis) ([facility])    Headache(784.0)    History of chicken pox    Hypertension    Morbid obesity ([place])    Osteoarthritis    Patient Active Problem List   Diagnosis Date Noted   Iron deficiency anemia due to chronic blood loss 01/17/2021   SOB (shortness of breath) 02/17/2015   Myalgia 07/24/2014   Hyperlipidemia 03/11/2014   Generalized anxiety disorder 01/14/2014   Chronic diastolic heart failure ([place]) 10/17/2013   Dyspnea 08/20/2013   Hypertension 07/22/2012   Osteoarthritis of both knees 07/22/2012   Left ankle pain 07/22/2012   Right knee pain 07/22/2012   Chronic low back pain 07/22/2012   Polyuria 07/22/2012   Home Medication(s) Prior to Admission medications   Medication Sig Start Date End Date Taking? Authorizing Provider  acetaminophen (TYLENOL) 500 MG tablet Take 500 mg by mouth every 6 (six) hours as needed for mild pain.    [provider]  albuterol (VENTOLIN HFA) 108 (90 Base) MCG/ACT inhaler albuterol sulfate HFA 90 mcg/actuation aerosol inhaler    [provider]  ALPRAZolam (XANAX) 0.25 MG tablet Take 0.25 mg by mouth 3 (three) times daily as  needed. 12/30/20   [provider]  amLODIPine-Valsartan-HCTZ [id number] MG TABS TAKE 1 TABLET BY MOUTH EVERY DAY 12/12/17   Nafziger, Tommi Rumps, NP  atorvastatin (LIPITOR) 20 MG tablet TAKE 1 TABLET BY MOUTH EVERY DAY 12/12/17   Nafziger, Tommi Rumps, NP  buprenorphine (BUTRANS) 20 MCG/HR PTWK 1 patch once a week. 01/13/21   [provider]  citalopram (CELEXA) 20 MG tablet TAKE 1 TABLET BY MOUTH EVERY DAY 12/12/17   Nafziger, Tommi Rumps, NP  ergocalciferol (VITAMIN D2) 1.25 MG (50000 UT) capsule ergocalciferol (vitamin D2) 1,250 mcg (50,000 unit) capsule  TAKE 1 (ONE) CAPSULE BY MOUTH WEEKLY    [provider]  ferrous sulfate 324 MG TBEC ferrous sulfate 324 mg (65 mg iron) tablet,delayed release  TAKE 1 TABLET BY MOUTH TWICE A DAY    [provider]  furosemide (LASIX) 40 MG tablet TAKE 1.5 TABLETS BY MOUTH DAILY. 12/09/18   Nafziger, Tommi Rumps, NP  lisinopril (ZESTRIL) 10 MG tablet lisinopril 10 mg tablet  TAKE 1 TABLET BY MOUTH EVERY DAY    [provider]  LYRICA 75 MG capsule Take 75 mg by mouth 2 (two) times daily. 09/27/17   [provider]  Magnesium Oxide 500 MG TABS magnesium oxide 500 mg tablet  TAKE 1 TABLET BY MOUTH EVERYDAY AT BEDTIME    [provider]  metoprolol tartrate (LOPRESSOR) 100 MG tablet Take two tablets ( 200 mg) in the morning and one tablet ( 100 mg) in  the evening. 11/20/17   Nafziger, Kandee Keenory, NP  oxyCODONE (ROXICODONE) 15 MG immediate release tablet Take 15 mg by mouth every 8 (eight) hours as needed for pain.    [provider]  phentermine 37.5 MG capsule Take 37.5 mg by mouth daily. 12/30/20   [provider]  Semaglutide, 1 MG/DOSE, (OZEMPIC, 1 MG/DOSE,) 4 MG/3ML SOPN Ozempic 1 mg/dose (4 mg/3 mL) subcutaneous pen injector    [provider]  senna-docusate (SENOKOT-S) 8.6-50 MG tablet Senna Plus 8.6 mg-50 mg tablet  2 (TWO) TABLET AS NEEDED    [provider]  spironolactone (ALDACTONE) 25 MG  tablet TAKE 1 TABLET BY MOUTH EVERY DAY 12/12/17   Nafziger, Kandee Keenory, NP  topiramate (TOPAMAX) 50 MG tablet topiramate 50 mg tablet    [provider]                                                                                                                                    Past Surgical History Past Surgical History:  Procedure Laterality Date   TUBAL LIGATION  1985   Family History Family History  Problem Relation Age of Onset   Arthritis Mother    Hypertension Mother    Prostate cancer Maternal Grandfather    Breast cancer Maternal Grandmother     Social History Social History   Tobacco Use   Smoking status: Never   Smokeless tobacco: Never  Substance Use Topics   Alcohol use: No    Alcohol/week: 0.0 standard drinks of alcohol   Drug use: No   Allergies Patient has no known allergies.  Review of Systems Review of Systems  All other systems reviewed and are negative.   Physical Exam Vital Signs  I have reviewed the triage vital signs BP (!) 163/77   Pulse 99   Temp 98.3 F (36.8 C) (Oral)   Resp 15   Ht 5\' 8"  (1.727 m)   Wt (!) 158.8 kg   SpO2 100%   BMI 53.22 kg/m  Physical Exam Vitals and nursing note reviewed.  Constitutional:      General: She is not in acute distress.    Appearance: She is well-developed. She is obese.  HENT:     Head: Normocephalic and atraumatic.     Mouth/Throat:     Mouth: Mucous membranes are moist.  Eyes:     Pupils: Pupils are equal, round, and reactive to light.  Cardiovascular:     Rate and Rhythm: Normal rate and regular rhythm.     Heart sounds: No murmur heard. Pulmonary:     Effort: Pulmonary effort is normal. No respiratory distress.     Breath sounds: Normal breath sounds.  Abdominal:     General: Abdomen is flat.     Palpations: Abdomen is soft.     Tenderness: There is no abdominal tenderness.  Musculoskeletal:        General: No tenderness.  Right lower leg: Edema present.     Left lower  leg: Edema present.     Comments: Bilateral lower extremity edema, left greater than right, with erythema, warmth and tenderness to the anterior left lower leg, no eschar, no crepitus.  Skin:    General: Skin is warm and dry.  Neurological:     General: No focal deficit present.     Mental Status: She is alert. Mental status is at baseline.  Psychiatric:        Mood and Affect: Mood normal.        Behavior: Behavior normal.     ED Results and Treatments Labs (all labs ordered are listed, but only abnormal results are displayed) Labs Reviewed  CBC WITH DIFFERENTIAL/PLATELET - Abnormal; Notable for the following components:      Result Value   RBC 3.42 (*)    Hemoglobin 9.5 (*)    HCT 30.3 (*)    All other components within normal limits  COMPREHENSIVE METABOLIC PANEL - Abnormal; Notable for the following components:   Potassium 2.9 (*)    Glucose, Bld 116 (*)    Albumin 2.9 (*)    All other components within normal limits  BRAIN NATRIURETIC PEPTIDE  PROTIME-INR  TROPONIN I (HIGH SENSITIVITY)  TROPONIN I (HIGH SENSITIVITY)                                                                                                                          Radiology CT Angio Chest PE W/Cm &/Or Wo Cm  Result Date: 08/18/2022 CLINICAL DATA:  Chest pain, PE suspected EXAM: CT ANGIOGRAPHY CHEST WITH CONTRAST TECHNIQUE: Multidetector CT imaging of the chest was performed using the standard protocol during bolus administration of intravenous contrast. Multiplanar CT image reconstructions and MIPs were obtained to evaluate the vascular anatomy. RADIATION DOSE REDUCTION: This exam was performed according to the departmental dose-optimization program which includes automated exposure control, adjustment of the mA and/or kV according to patient size and/or use of iterative reconstruction technique. CONTRAST:  3mL OMNIPAQUE IOHEXOL 350 MG/ML SOLN COMPARISON:  02/20/2017 FINDINGS: Cardiovascular:  Examination for pulmonary embolism is somewhat limited by marginal contrast bolus and breath motion artifact. Within this limitation, no evidence of pulmonary embolism through the proximal segmental pulmonary arterial level. Cardiomegaly. Left coronary artery calcifications. No pericardial effusion. Mediastinum/Nodes: No enlarged mediastinal, hilar, or axillary lymph nodes. Mild tracheobronchomalacia. Thyroid gland, trachea, and esophagus otherwise demonstrate no significant findings. Lungs/Pleura: Lobular air trapping. No pleural effusion or pneumothorax. Upper Abdomen: No acute abnormality. Musculoskeletal: No chest wall abnormality. No acute osseous findings. Disc degenerative disease and bridging osteophytosis throughout the thoracic spine, in keeping with DISH. Review of the MIP images confirms the above findings. IMPRESSION: 1. Examination for pulmonary embolism is somewhat limited by marginal contrast bolus and breath motion artifact. Within this limitation, no evidence of pulmonary embolism through the proximal segmental pulmonary arterial level. 2. Mild tracheobronchomalacia. 3. Lobular air trapping, suggestive of small airways disease. 4.  Cardiomegaly and coronary artery disease. Electronically Signed   By: Delanna Ahmadi M.D.   On: 08/18/2022 14:27   VAS [place] LOWER EXTREMITY VENOUS (DVT) (7a-7p)  Result Date: 08/18/2022  Lower Venous DVT Study Patient Name:  [name]  Date of Exam:   08/18/2022 Medical Rec #: [id number]      Accession #:    [id number] Date of Birth: [birth year]      Patient Gender: F Patient Age:   63 years Exam Location:  [facility] Procedure:      VAS [place] LOWER EXTREMITY VENOUS (DVT) Referring Phys: Garnette Gunner --------------------------------------------------------------------------------  Indications: Pain.  Limitations: Body habitus and poor ultrasound/tissue interface. Comparison Study: no prior Performing Technologist: Archie Patten RVS  Examination Guidelines:  A complete evaluation includes B-mode imaging, spectral Doppler, color Doppler, and power Doppler as needed of all accessible portions of each vessel. Bilateral testing is considered an integral part of a complete examination. Limited examinations for reoccurring indications may be performed as noted. The reflux portion of the exam is performed with the patient in reverse Trendelenburg.  +-----+---------------+---------+-----------+----------+--------------+ RIGHTCompressibilityPhasicitySpontaneityPropertiesThrombus Aging +-----+---------------+---------+-----------+----------+--------------+ CFV  Full           Yes      Yes                                 +-----+---------------+---------+-----------+----------+--------------+   +--------+---------------+---------+-----------+----------+--------------------+ LEFT    CompressibilityPhasicitySpontaneityPropertiesThrombus Aging       +--------+---------------+---------+-----------+----------+--------------------+ CFV     Full           Yes      Yes                                       +--------+---------------+---------+-----------+----------+--------------------+ SFJ     Full                                                              +--------+---------------+---------+-----------+----------+--------------------+ FV Prox Full                                                              +--------+---------------+---------+-----------+----------+--------------------+ FV Mid                 Yes      Yes                  patent by color                                                           doppler              +--------+---------------+---------+-----------+----------+--------------------+ FV                     Yes  Yes                  patent by color      Distal                                               doppler               +--------+---------------+---------+-----------+----------+--------------------+ PFV     Full                                                              +--------+---------------+---------+-----------+----------+--------------------+ POP     Full           Yes      Yes                                       +--------+---------------+---------+-----------+----------+--------------------+ PTV                    Yes      Yes                  patent by color                                                           doppler              +--------+---------------+---------+-----------+----------+--------------------+ PERO                   Yes      Yes                  patent by color                                                           doppler              +--------+---------------+---------+-----------+----------+--------------------+   Summary: RIGHT: - No evidence of common femoral vein obstruction.  LEFT: - There is no evidence of deep vein thrombosis in the lower extremity. However, portions of this examination were limited- see technologist comments above.  - No cystic structure found in the popliteal fossa.  *See table(s) above for measurements and observations.    Preliminary    DG Chest 2 View  Result Date: 08/18/2022 CLINICAL DATA:  Shortness of breath with leg pain and swelling. History of blood clots. EXAM: CHEST - 2 VIEW COMPARISON:  11/14/2017 FINDINGS: Lungs are adequately inflated without focal airspace consolidation or effusion. Minimal prominence of the central bronchovascular markings which may be due to minimal vascular congestion versus acute bronchitic process. Cardiomediastinal silhouette is normal. Mild degenerative change of the spine. IMPRESSION: Minimal prominence of the central bronchovascular markings  which may be due to minimal vascular congestion versus acute bronchitic process. Electronically Signed   By: Marin Olp M.D.   On:  08/18/2022 11:57    Pertinent labs & imaging results that were available during my care of the patient were reviewed by me and considered in my medical decision making (see MDM for details).  Medications Ordered in ED Medications  albuterol (VENTOLIN HFA) 108 (90 Base) MCG/ACT inhaler 2 puff (has no administration in time range)  potassium chloride SA (KLOR-CON M) CR tablet 40 mEq (has no administration in time range)  dalbavancin (DALVANCE) 1,500 mg in dextrose 5 % 500 mL IVPB (has no administration in time range)  iohexol (OMNIPAQUE) 350 MG/ML injection 75 mL (75 mLs Intravenous Contrast Given 08/18/22 1420)                                                                                                                                     Procedures .1-3 Lead EKG Interpretation  Performed by: Cristie Hem, MD Authorized by: Cristie Hem, MD     Interpretation: abnormal     ECG rate:  102   ECG rate assessment: tachycardic     Rhythm: sinus rhythm     Ectopy: none     Conduction: normal     (including critical care time)  Medical Decision Making / ED Course   MDM:  56 year old female presenting to the emergency department for leg pain and shortness of breath.  Patient well-appearing, physical exam notable for bilateral lower extremity edema, but on exam, lungs are clear.  Differential includes DVT, cellulitis, pulmonary embolism, venous stasis changes.  Ultrasound of the left lower extremity negative for DVT.  Labs reassuring including negative BNP.  Suspect edema is probably more likely related to chronic venous stasis changes, with superimposed cellulitis to the left lower extremity.  Given leg swelling and shortness of breath, as well as initial tachycardia, will obtain CTA chest to evaluate for pulmonary embolism.  If CTA chest negative, likely discharge with oral antibiotics and outpatient follow-up.  Clinical Course as of 08/18/22 1534  Fri Aug 18, 2022   1532 CT chest and DVT US negative for PE/DVT. Suspect cellulitis. Will treat with dalvance given initial tachycardia and size of area of cellulitis. Will discharge patient to home. All questions answered. Patient comfortable with plan of discharge. Return precautions discussed with patient and specified on the after visit summary.  [WS]    Clinical Course User Index [WS] Cristie Hem, MD     Additional history obtained: -Additional history obtained from family -External records from outside source obtained and reviewed including: Chart review including previous notes, labs, imaging, consultation notes including hematology note 03/31/21   Lab Tests: -I ordered, reviewed, and interpreted labs.   The pertinent results include:   Labs Reviewed  CBC WITH DIFFERENTIAL/PLATELET - Abnormal; Notable for the following components:  Result Value   RBC 3.42 (*)    Hemoglobin 9.5 (*)    HCT 30.3 (*)    All other components within normal limits  COMPREHENSIVE METABOLIC PANEL - Abnormal; Notable for the following components:   Potassium 2.9 (*)    Glucose, Bld 116 (*)    Albumin 2.9 (*)    All other components within normal limits  BRAIN NATRIURETIC PEPTIDE  PROTIME-INR  TROPONIN I (HIGH SENSITIVITY)  TROPONIN I (HIGH SENSITIVITY)    Notable for mild hypokalemia, normal BNP/ trop  EKG   EKG Interpretation  Date/Time:  Friday August 18 2022 11:17:17 EST Ventricular Rate:  124 PR Interval:  118 QRS Duration: 98 QT Interval:  318 QTC Calculation: 456 R Axis:   14 Text Interpretation: Sinus tachycardia with Premature supraventricular complexes Left ventricular hypertrophy with repolarization abnormality ( R in aVL , Cornell product , Romhilt-Estes ) Abnormal ECG When compared with ECG of 14-Nov-2017 13:06, SINCE LAST TRACING HEART RATE HAS INCREASED Confirmed by Garnette Gunner [phone]) on 08/18/2022 12:22:25 PM         Imaging Studies ordered: I ordered imaging studies  including DVT US, CT PE On my interpretation imaging demonstrates no acute PE/DVT I independently visualized and interpreted imaging. I agree with the radiologist interpretation   Medicines ordered and prescription drug management: Meds ordered this encounter  Medications   albuterol (VENTOLIN HFA) 108 (90 Base) MCG/ACT inhaler 2 puff   iohexol (OMNIPAQUE) 350 MG/ML injection 75 mL   potassium chloride SA (KLOR-CON M) CR tablet 40 mEq   dalbavancin (DALVANCE) 1,500 mg in dextrose 5 % 500 mL IVPB    Order Specific Question:   Indication:    Answer:   Cellulitis    -I have reviewed the patients home medicines and have made adjustments as needed  Cardiac Monitoring: The patient was maintained on a cardiac monitor.  I personally viewed and interpreted the cardiac monitored which showed an underlying rhythm of: sinus tachycardia  Social Determinants of Health:  Diagnosis or treatment significantly limited by social determinants of health: obesity   Reevaluation: After the interventions noted above, I reevaluated the patient and found that they have improved  Co morbidities that complicate the patient evaluation  Past Medical History:  Diagnosis Date   CHF (congestive heart failure) ([place])    DVT (deep venous thrombosis) (H[facility])    Headache(784.0)    History of chicken pox    Hypertension    Morbid obesity ([place])    Osteoarthritis       Dispostion: Disposition decision including need for hospitalization was considered, and patient discharged from emergency department.    Final Clinical Impression(s) / ED Diagnoses Final diagnoses:  Cellulitis of left lower extremity     This chart was dictated using voice recognition software.  Despite best efforts to proofread,  errors can occur which can change the documentation meaning.    Cristie Hem, MD 08/18/22 1534

## 2022-08-30 ENCOUNTER — Ambulatory Visit: Payer: Medicare HMO | Admitting: Internal Medicine

## 2022-08-30 ENCOUNTER — Encounter: Payer: Self-pay | Admitting: Internal Medicine

## 2022-08-30 ENCOUNTER — Other Ambulatory Visit: Payer: Self-pay

## 2022-08-30 VITALS — BP 154/88 | HR 106 | Temp 98.4°F | Wt 367.0 lb

## 2022-08-30 DIAGNOSIS — I878 Other specified disorders of veins: Secondary | ICD-10-CM

## 2022-08-30 DIAGNOSIS — L03116 Cellulitis of left lower limb: Secondary | ICD-10-CM | POA: Diagnosis not present

## 2022-08-30 DIAGNOSIS — I872 Venous insufficiency (chronic) (peripheral): Secondary | ICD-10-CM | POA: Diagnosis not present

## 2022-08-30 MED ORDER — TRIAMCINOLONE ACETONIDE 0.5 % EX OINT: 1.0000 | TOPICAL_OINTMENT | Freq: Two times a day (BID) | CUTANEOUS | 1 refills | Status: AC

## 2022-08-30 NOTE — Progress Notes (Signed)
[facility]  Reason for Consult:ed cellulitis hospital referral Referring Provider:  ed    Patient Active Problem List   Diagnosis Date Noted   Iron deficiency anemia due to chronic blood loss 01/17/2021   SOB (shortness of breath) 02/17/2015   Myalgia 07/24/2014   Hyperlipidemia 03/11/2014   Generalized anxiety disorder 01/14/2014   Chronic diastolic heart failure (HCC) 10/17/2013   Dyspnea 08/20/2013   Hypertension 07/22/2012   Osteoarthritis of both knees 07/22/2012   Left ankle pain 07/22/2012   Right knee pain 07/22/2012   Chronic low back pain 07/22/2012   Polyuria 07/22/2012      HPI: [name] is a 56 y.o. female hx chf, dvt, obesity here for hospital ed visit for cellulitis  Reviewed chart  She was seen on 11/24 for 1 week left lower ext pain/redness without trauma. Patient hasn't been on anticoagulation  Hospital w/u negative for dvt/pe or ACS  She was given a dose of dalbavancin for LLE cellulitis and discharged   Review of Systems: ROS All other ros negative      Past Medical History:  Diagnosis Date   CHF (congestive heart failure) (HCC)    DVT (deep venous thrombosis) (HCC)    Headache(784.0)    History of chicken pox    Hypertension    Morbid obesity ([facility])    Osteoarthritis     Social History   Tobacco Use   Smoking status: Never   Smokeless tobacco: Never  Substance Use Topics   Alcohol use: No    Alcohol/week: 0.0 standard drinks of alcohol   Drug use: No    Family History  Problem Relation Age of Onset   Arthritis Mother    Hypertension Mother    Prostate cancer Maternal Grandfather    Breast cancer Maternal Grandmother     No Known Allergies  OBJECTIVE: Vitals:   08/30/22 1525  BP: (!) 154/88  Pulse: (!) 106  Temp: 98.4 F (36.9 C)  TempSrc: Oral  SpO2: 97%  Weight: (!) 367 lb (166.5 kg)   Body mass index is 55.8 kg/m.   Physical  Exam General/constitutional: no distress, pleasant; obese HEENT: Normocephalic, PER, Conj Clear, EOMI, Oropharynx clear Neck supple CV: rrr no mrg Lungs: clear to auscultation, normal respiratory effort Abd: Soft, Nontender Ext/skin: 2+ edema to thigh bilateral LE; chronic pigmentation change bilateral le with open small <1inch wounds on rle no pus; left LE slight warmth no tenderness or fluctuance or erythema Neuro: nonfocal MSK: no peripheral joint swelling/tenderness/warmth; back spines nontender   Lab: Lab Results  Component Value Date   WBC 9.3 08/18/2022   HGB 9.5 (L) 08/18/2022   HCT 30.3 (L) 08/18/2022   MCV 88.6 08/18/2022   PLT 304 08/18/2022   Last metabolic panel Lab Results  Component Value Date   GLUCOSE 116 (H) 08/18/2022   NA 142 08/18/2022   K 2.9 (L) 08/18/2022   CL 104 08/18/2022   CO2 27 08/18/2022   BUN 10 08/18/2022   CREATININE 0.79 08/18/2022   GFRNONAA >60 08/18/2022   CALCIUM 9.0 08/18/2022   PROT 7.5 08/18/2022   ALBUMIN 2.9 (L) 08/18/2022   BILITOT 0.5 08/18/2022   ALKPHOS 98 08/18/2022   AST 18 08/18/2022   ALT 13 08/18/2022   ANIONGAP 11 08/18/2022    Microbiology:  Serology:  Imaging:   Assessment/plan: Problem List Items Addressed This Visit   None Visit Diagnoses  Chronic venous stasis    -  Primary   Cellulitis of left lower extremity       Venous stasis dermatitis of left lower extremity             Cellulitis appear treated Some degree of venous stasis dermatitis suspected Chronic venous stasis with pigment change and some open wound rle  Advise pcp referral to lymphedema clinic for compression wrap to treat stasis Trial of kenalogue ointment  Sign of recurrent cellulitis discussed and she can f/u as needed for this  I have spent a total of 65 minutes of face-to-face and non-face-to-face time, excluding clinical staff time, preparing to see patient, ordering tests and/or medications, and provide counseling  the patient         Follow-up: Return if symptoms worsen or fail to improve.  Raymondo Band, MD [facility] 08/30/2022, 3:16 PM

## 2022-08-30 NOTE — Patient Instructions (Signed)
Please have your pcp refer you to a LYMPHEDEMA clinic to help wrap your legs to treat the swelling. Diuretics and compression stockings will not help   This will reduce your skin from breaking down or put you at risk for more infection   Also try kenalog ointment twice a day for the next couple weeks to your left leg to see if the redness/irritation would resolve    If persistent progressive pain/rapidly spreading redness or fever chill occur, please let me know ASAP so we can get you on more antibiotics

## 2022-08-31 DIAGNOSIS — E559 Vitamin D deficiency, unspecified: Secondary | ICD-10-CM | POA: Diagnosis not present

## 2022-08-31 DIAGNOSIS — K5900 Constipation, unspecified: Secondary | ICD-10-CM | POA: Diagnosis not present

## 2022-08-31 DIAGNOSIS — Z79899 Other long term (current) drug therapy: Secondary | ICD-10-CM | POA: Diagnosis not present

## 2022-08-31 DIAGNOSIS — I10 Essential (primary) hypertension: Secondary | ICD-10-CM | POA: Diagnosis not present

## 2022-08-31 DIAGNOSIS — F419 Anxiety disorder, unspecified: Secondary | ICD-10-CM | POA: Diagnosis not present

## 2022-08-31 DIAGNOSIS — M545 Low back pain, unspecified: Secondary | ICD-10-CM | POA: Diagnosis not present

## 2022-08-31 DIAGNOSIS — E119 Type 2 diabetes mellitus without complications: Secondary | ICD-10-CM | POA: Diagnosis not present

## 2022-08-31 DIAGNOSIS — Z9181 History of falling: Secondary | ICD-10-CM | POA: Diagnosis not present

## 2022-08-31 DIAGNOSIS — F331 Major depressive disorder, recurrent, moderate: Secondary | ICD-10-CM | POA: Diagnosis not present

## 2022-08-31 DIAGNOSIS — Z79891 Long term (current) use of opiate analgesic: Secondary | ICD-10-CM | POA: Diagnosis not present

## 2022-08-31 DIAGNOSIS — R69 Illness, unspecified: Secondary | ICD-10-CM | POA: Diagnosis not present

## 2022-08-31 DIAGNOSIS — M5137 Other intervertebral disc degeneration, lumbosacral region: Secondary | ICD-10-CM | POA: Diagnosis not present

## 2022-08-31 DIAGNOSIS — G894 Chronic pain syndrome: Secondary | ICD-10-CM | POA: Diagnosis not present

## 2022-09-04 DIAGNOSIS — I10 Essential (primary) hypertension: Secondary | ICD-10-CM | POA: Diagnosis not present

## 2022-09-04 DIAGNOSIS — Z6841 Body Mass Index (BMI) 40.0 and over, adult: Secondary | ICD-10-CM | POA: Diagnosis not present

## 2022-09-04 DIAGNOSIS — R030 Elevated blood-pressure reading, without diagnosis of hypertension: Secondary | ICD-10-CM | POA: Diagnosis not present

## 2022-09-04 DIAGNOSIS — R0602 Shortness of breath: Secondary | ICD-10-CM | POA: Diagnosis not present

## 2022-09-05 DIAGNOSIS — Z79899 Other long term (current) drug therapy: Secondary | ICD-10-CM | POA: Diagnosis not present

## 2022-09-29 DIAGNOSIS — Z9181 History of falling: Secondary | ICD-10-CM | POA: Diagnosis not present

## 2022-09-29 DIAGNOSIS — Z79899 Other long term (current) drug therapy: Secondary | ICD-10-CM | POA: Diagnosis not present

## 2022-09-29 DIAGNOSIS — M5137 Other intervertebral disc degeneration, lumbosacral region: Secondary | ICD-10-CM | POA: Diagnosis not present

## 2022-09-29 DIAGNOSIS — Z0000 Encounter for general adult medical examination without abnormal findings: Secondary | ICD-10-CM | POA: Diagnosis not present

## 2022-09-29 DIAGNOSIS — E119 Type 2 diabetes mellitus without complications: Secondary | ICD-10-CM | POA: Diagnosis not present

## 2022-09-29 DIAGNOSIS — M545 Low back pain, unspecified: Secondary | ICD-10-CM | POA: Diagnosis not present

## 2022-09-29 DIAGNOSIS — I10 Essential (primary) hypertension: Secondary | ICD-10-CM | POA: Diagnosis not present

## 2022-09-29 DIAGNOSIS — K5900 Constipation, unspecified: Secondary | ICD-10-CM | POA: Diagnosis not present

## 2022-09-29 DIAGNOSIS — G894 Chronic pain syndrome: Secondary | ICD-10-CM | POA: Diagnosis not present

## 2022-09-29 DIAGNOSIS — F419 Anxiety disorder, unspecified: Secondary | ICD-10-CM | POA: Diagnosis not present

## 2022-09-29 DIAGNOSIS — R69 Illness, unspecified: Secondary | ICD-10-CM | POA: Diagnosis not present

## 2022-09-29 DIAGNOSIS — E559 Vitamin D deficiency, unspecified: Secondary | ICD-10-CM | POA: Diagnosis not present

## 2022-09-29 DIAGNOSIS — F331 Major depressive disorder, recurrent, moderate: Secondary | ICD-10-CM | POA: Diagnosis not present

## 2022-10-09 DIAGNOSIS — I10 Essential (primary) hypertension: Secondary | ICD-10-CM | POA: Diagnosis not present

## 2022-10-31 DIAGNOSIS — G4730 Sleep apnea, unspecified: Secondary | ICD-10-CM | POA: Diagnosis not present

## 2022-10-31 DIAGNOSIS — M5137 Other intervertebral disc degeneration, lumbosacral region: Secondary | ICD-10-CM | POA: Diagnosis not present

## 2022-10-31 DIAGNOSIS — Z79899 Other long term (current) drug therapy: Secondary | ICD-10-CM | POA: Diagnosis not present

## 2022-10-31 DIAGNOSIS — Z9181 History of falling: Secondary | ICD-10-CM | POA: Diagnosis not present

## 2022-10-31 DIAGNOSIS — I10 Essential (primary) hypertension: Secondary | ICD-10-CM | POA: Diagnosis not present

## 2022-10-31 DIAGNOSIS — R69 Illness, unspecified: Secondary | ICD-10-CM | POA: Diagnosis not present

## 2022-10-31 DIAGNOSIS — K5900 Constipation, unspecified: Secondary | ICD-10-CM | POA: Diagnosis not present

## 2022-10-31 DIAGNOSIS — G894 Chronic pain syndrome: Secondary | ICD-10-CM | POA: Diagnosis not present

## 2022-10-31 DIAGNOSIS — M545 Low back pain, unspecified: Secondary | ICD-10-CM | POA: Diagnosis not present

## 2022-10-31 DIAGNOSIS — F419 Anxiety disorder, unspecified: Secondary | ICD-10-CM | POA: Diagnosis not present

## 2022-10-31 DIAGNOSIS — F331 Major depressive disorder, recurrent, moderate: Secondary | ICD-10-CM | POA: Diagnosis not present

## 2022-10-31 DIAGNOSIS — E119 Type 2 diabetes mellitus without complications: Secondary | ICD-10-CM | POA: Diagnosis not present

## 2022-10-31 DIAGNOSIS — E559 Vitamin D deficiency, unspecified: Secondary | ICD-10-CM | POA: Diagnosis not present

## 2022-11-02 DIAGNOSIS — Z79899 Other long term (current) drug therapy: Secondary | ICD-10-CM | POA: Diagnosis not present

## 2022-11-03 DIAGNOSIS — R600 Localized edema: Secondary | ICD-10-CM | POA: Diagnosis not present

## 2022-11-03 DIAGNOSIS — I10 Essential (primary) hypertension: Secondary | ICD-10-CM | POA: Diagnosis not present

## 2022-11-03 DIAGNOSIS — Z6841 Body Mass Index (BMI) 40.0 and over, adult: Secondary | ICD-10-CM | POA: Diagnosis not present

## 2022-11-28 DIAGNOSIS — M545 Low back pain, unspecified: Secondary | ICD-10-CM | POA: Diagnosis not present

## 2022-11-28 DIAGNOSIS — M5137 Other intervertebral disc degeneration, lumbosacral region: Secondary | ICD-10-CM | POA: Diagnosis not present

## 2022-11-28 DIAGNOSIS — R030 Elevated blood-pressure reading, without diagnosis of hypertension: Secondary | ICD-10-CM | POA: Diagnosis not present

## 2022-11-28 DIAGNOSIS — G894 Chronic pain syndrome: Secondary | ICD-10-CM | POA: Diagnosis not present

## 2022-11-28 DIAGNOSIS — Z79899 Other long term (current) drug therapy: Secondary | ICD-10-CM | POA: Diagnosis not present

## 2022-11-28 DIAGNOSIS — Z6841 Body Mass Index (BMI) 40.0 and over, adult: Secondary | ICD-10-CM | POA: Diagnosis not present

## 2022-11-28 DIAGNOSIS — Z79891 Long term (current) use of opiate analgesic: Secondary | ICD-10-CM | POA: Diagnosis not present

## 2022-11-30 DIAGNOSIS — Z79899 Other long term (current) drug therapy: Secondary | ICD-10-CM | POA: Diagnosis not present

## 2022-12-05 DIAGNOSIS — Z9181 History of falling: Secondary | ICD-10-CM | POA: Diagnosis not present

## 2022-12-05 DIAGNOSIS — R5383 Other fatigue: Secondary | ICD-10-CM | POA: Diagnosis not present

## 2022-12-05 DIAGNOSIS — D509 Iron deficiency anemia, unspecified: Secondary | ICD-10-CM | POA: Diagnosis not present

## 2022-12-05 DIAGNOSIS — Z6841 Body Mass Index (BMI) 40.0 and over, adult: Secondary | ICD-10-CM | POA: Diagnosis not present

## 2022-12-05 DIAGNOSIS — E559 Vitamin D deficiency, unspecified: Secondary | ICD-10-CM | POA: Diagnosis not present

## 2022-12-05 DIAGNOSIS — F331 Major depressive disorder, recurrent, moderate: Secondary | ICD-10-CM | POA: Diagnosis not present

## 2022-12-05 DIAGNOSIS — D539 Nutritional anemia, unspecified: Secondary | ICD-10-CM | POA: Diagnosis not present

## 2022-12-05 DIAGNOSIS — M129 Arthropathy, unspecified: Secondary | ICD-10-CM | POA: Diagnosis not present

## 2022-12-05 DIAGNOSIS — I10 Essential (primary) hypertension: Secondary | ICD-10-CM | POA: Diagnosis not present

## 2022-12-05 DIAGNOSIS — E119 Type 2 diabetes mellitus without complications: Secondary | ICD-10-CM | POA: Diagnosis not present

## 2022-12-05 DIAGNOSIS — K219 Gastro-esophageal reflux disease without esophagitis: Secondary | ICD-10-CM | POA: Diagnosis not present

## 2022-12-05 DIAGNOSIS — R69 Illness, unspecified: Secondary | ICD-10-CM | POA: Diagnosis not present

## 2022-12-05 DIAGNOSIS — E7800 Pure hypercholesterolemia, unspecified: Secondary | ICD-10-CM | POA: Diagnosis not present

## 2022-12-05 DIAGNOSIS — Z79899 Other long term (current) drug therapy: Secondary | ICD-10-CM | POA: Diagnosis not present

## 2022-12-05 DIAGNOSIS — R600 Localized edema: Secondary | ICD-10-CM | POA: Diagnosis not present

## 2022-12-05 DIAGNOSIS — L0390 Cellulitis, unspecified: Secondary | ICD-10-CM | POA: Diagnosis not present

## 2022-12-27 DIAGNOSIS — F419 Anxiety disorder, unspecified: Secondary | ICD-10-CM | POA: Diagnosis not present

## 2022-12-27 DIAGNOSIS — E559 Vitamin D deficiency, unspecified: Secondary | ICD-10-CM | POA: Diagnosis not present

## 2022-12-27 DIAGNOSIS — E119 Type 2 diabetes mellitus without complications: Secondary | ICD-10-CM | POA: Diagnosis not present

## 2022-12-27 DIAGNOSIS — D509 Iron deficiency anemia, unspecified: Secondary | ICD-10-CM | POA: Diagnosis not present

## 2022-12-27 DIAGNOSIS — L0390 Cellulitis, unspecified: Secondary | ICD-10-CM | POA: Diagnosis not present

## 2022-12-27 DIAGNOSIS — M545 Low back pain, unspecified: Secondary | ICD-10-CM | POA: Diagnosis not present

## 2022-12-27 DIAGNOSIS — I10 Essential (primary) hypertension: Secondary | ICD-10-CM | POA: Diagnosis not present

## 2022-12-27 DIAGNOSIS — G894 Chronic pain syndrome: Secondary | ICD-10-CM | POA: Diagnosis not present

## 2022-12-27 DIAGNOSIS — R69 Illness, unspecified: Secondary | ICD-10-CM | POA: Diagnosis not present

## 2022-12-27 DIAGNOSIS — Z79899 Other long term (current) drug therapy: Secondary | ICD-10-CM | POA: Diagnosis not present

## 2022-12-27 DIAGNOSIS — M5137 Other intervertebral disc degeneration, lumbosacral region: Secondary | ICD-10-CM | POA: Diagnosis not present

## 2022-12-27 DIAGNOSIS — F331 Major depressive disorder, recurrent, moderate: Secondary | ICD-10-CM | POA: Diagnosis not present

## 2022-12-28 ENCOUNTER — Telehealth: Payer: Self-pay | Admitting: Hematology and Oncology

## 2022-12-28 NOTE — Telephone Encounter (Signed)
Called patient per 4/4 IB message to schedule for next available appointment. Left voicemail with new appointment information and contact details if needing to reschedule.

## 2022-12-29 DIAGNOSIS — Z79899 Other long term (current) drug therapy: Secondary | ICD-10-CM | POA: Diagnosis not present

## 2023-01-05 ENCOUNTER — Telehealth: Payer: Self-pay | Admitting: Pharmacy Technician

## 2023-01-05 ENCOUNTER — Other Ambulatory Visit: Payer: Self-pay | Admitting: Hematology and Oncology

## 2023-01-05 ENCOUNTER — Inpatient Hospital Stay: Payer: Medicare HMO | Attending: Hematology and Oncology

## 2023-01-05 ENCOUNTER — Inpatient Hospital Stay: Payer: Medicare HMO | Admitting: Hematology and Oncology

## 2023-01-05 ENCOUNTER — Other Ambulatory Visit: Payer: Self-pay

## 2023-01-05 VITALS — BP 169/95 | HR 116 | Temp 98.1°F | Resp 18 | Wt 373.2 lb

## 2023-01-05 DIAGNOSIS — D500 Iron deficiency anemia secondary to blood loss (chronic): Secondary | ICD-10-CM

## 2023-01-05 DIAGNOSIS — N920 Excessive and frequent menstruation with regular cycle: Secondary | ICD-10-CM | POA: Diagnosis not present

## 2023-01-05 DIAGNOSIS — Z79899 Other long term (current) drug therapy: Secondary | ICD-10-CM | POA: Insufficient documentation

## 2023-01-05 LAB — CMP (CANCER CENTER ONLY)
ALT: 9 U/L (ref 0–44)
AST: 13 U/L — ABNORMAL LOW (ref 15–41)
Albumin: 3.7 g/dL (ref 3.5–5.0)
Alkaline Phosphatase: 121 U/L (ref 38–126)
Anion gap: 7 (ref 5–15)
BUN: 11 mg/dL (ref 6–20)
CO2: 28 mmol/L (ref 22–32)
Calcium: 9.5 mg/dL (ref 8.9–10.3)
Chloride: 103 mmol/L (ref 98–111)
Creatinine: 0.78 mg/dL (ref 0.44–1.00)
GFR, Estimated: >60 mL/min (ref >60)
Glucose, Bld: 106 mg/dL — ABNORMAL HIGH (ref 70–99)
Potassium: 3.8 mmol/L (ref 3.5–5.1)
Sodium: 138 mmol/L (ref 135–145)
Total Bilirubin: 0.3 mg/dL (ref 0.3–1.2)
Total Protein: 8.5 g/dL — ABNORMAL HIGH (ref 6.5–8.1)

## 2023-01-05 LAB — IRON AND IRON BINDING CAPACITY (CC-WL,HP ONLY)
Iron: 17 ug/dL — ABNORMAL LOW (ref 28–170)
Saturation Ratios: 5 % — ABNORMAL LOW (ref 10.4–31.8)
TIBC: 370 ug/dL (ref 250–450)
UIBC: 353 ug/dL (ref 148–442)

## 2023-01-05 LAB — CBC WITH DIFFERENTIAL (CANCER CENTER ONLY)
Abs Immature Granulocytes: 0.03 10*3/uL (ref 0.00–0.07)
Basophils Absolute: 0.1 10*3/uL (ref 0.0–0.1)
Basophils Relative: 1 %
Eosinophils Absolute: 0.2 10*3/uL (ref 0.0–0.5)
Eosinophils Relative: 2 %
HCT: 27.5 % — ABNORMAL LOW (ref 36.0–46.0)
Hemoglobin: 8.7 g/dL — ABNORMAL LOW (ref 12.0–15.0)
Immature Granulocytes: 0 %
Lymphocytes Relative: 25 %
Lymphs Abs: 2.2 10*3/uL (ref 0.7–4.0)
MCH: 24.9 pg — ABNORMAL LOW (ref 26.0–34.0)
MCHC: 31.6 g/dL (ref 30.0–36.0)
MCV: 78.6 fL — ABNORMAL LOW (ref 80.0–100.0)
Monocytes Absolute: 0.9 10*3/uL (ref 0.1–1.0)
Monocytes Relative: 11 %
Neutro Abs: 5.2 10*3/uL (ref 1.7–7.7)
Neutrophils Relative %: 61 %
Platelet Count: 503 10*3/uL — ABNORMAL HIGH (ref 150–400)
RBC: 3.5 MIL/uL — ABNORMAL LOW (ref 3.87–5.11)
RDW: 15.9 % — ABNORMAL HIGH (ref 11.5–15.5)
WBC Count: 8.6 10*3/uL (ref 4.0–10.5)
nRBC: 0 % (ref 0.0–0.2)

## 2023-01-05 LAB — RETIC PANEL
Immature Retic Fract: 21.4 % — ABNORMAL HIGH (ref 2.3–15.9)
RBC.: 3.52 MIL/uL — ABNORMAL LOW (ref 3.87–5.11)
Retic Count, Absolute: 50.7 10*3/uL (ref 19.0–186.0)
Retic Ct Pct: 1.4 % (ref 0.4–3.1)
Reticulocyte Hemoglobin: 22.3 pg — ABNORMAL LOW (ref >27.9)

## 2023-01-05 LAB — FERRITIN: Ferritin: 34 ng/mL (ref 11–307)

## 2023-01-05 NOTE — Progress Notes (Signed)
[facility] Telephone:([phone]   Fax:(336) [phone]  PROGRESS NOTE  Patient Care Team: Pcp, No as PCP - General  Hematological/Oncological History # Iron Deficiency Anemia 2/2 to GYN Bleeding 02/13/2020: WBC 5.6, hgb 9.7, MCV 87, Plt 295 01/03/2021: WBC 5.2, Hgb 10.7, MCV 87, Plt 276 01/17/2021: establish care with Dr. Leonides Schanz  02/04/2021-03/04/2021: IV venofer 200 mg x 5 doses. Lost to Follow up.   Interval History:  [name] 57 y.o. female with medical history significant for iron deficiency anemia 2/2 to GYN bleeding who presents for a follow up visit. The patient's last visit was on 11/19/2020 at which time she established care. In the interim since the last visit she was lost to follow up.   On exam today [name] reports that she tolerated the IV iron she received in 2022 quite well.  She reports that she began feeling improvement after symptoms approximate 2 to 3 weeks after starting the therapy.  She reports now her fatigue symptoms are back.  She is feeling tired all the time and she "passed out" proximally 2 times in the last month.  She reports that she has been having a lot of ice cravings and a lot of stomach upset if she tries to take iron pills.  She reports that she has gone through menopause and is not actively having menstrual cycles.  She notes she has never undergone colonoscopy though she think she has done the stool cards before.  She reports that she sees her primary doctor approximately every 2 to 3 months.  She notes she did recently have a wound on her leg for which she was taking antibiotic therapy.  She otherwise denies any nausea, vomiting, diarrhea, fevers, chills, sweats.  A full 10 point ROS is otherwise negative.  MEDICAL HISTORY:  Past Medical History:  Diagnosis Date   CHF (congestive heart failure) (HCC)    DVT (deep venous thrombosis) (HCC)    Headache(784.0)    History of chicken pox    Hypertension    Morbid obesity (HCC)     Osteoarthritis     SURGICAL HISTORY: Past Surgical History:  Procedure Laterality Date   TUBAL LIGATION  1985    SOCIAL HISTORY: Social History   Socioeconomic History   Marital status: Divorced    Spouse name: Not on file   Number of children: Not on file   Years of education: Not on file   Highest education level: Not on file  Occupational History   Occupation: Warehouse    Employer: [employer]  Tobacco Use   Smoking status: Never   Smokeless tobacco: Never  Substance and Sexual Activity   Alcohol use: No    Alcohol/week: 0.0 standard drinks of alcohol   Drug use: No   Sexual activity: Not on file  Other Topics Concern   Not on file  Social History Narrative   Currently unemployed    Has one daughter in [place]    Social Determinants of Health   Financial Resource Strain: Not on file  Food Insecurity: Not on file  Transportation Needs: Not on file  Physical Activity: Not on file  Stress: Not on file  Social Connections: Not on file  Intimate Partner Violence: Not on file    FAMILY HISTORY: Family History  Problem Relation Age of Onset   Arthritis Mother    Hypertension Mother    Prostate cancer Maternal Grandfather    Breast cancer Maternal Grandmother  ALLERGIES:  has No Known Allergies.  MEDICATIONS:  Current Outpatient Medications  Medication Sig Dispense Refill   acetaminophen (TYLENOL) 500 MG tablet Take 500 mg by mouth every 6 (six) hours as needed for mild pain.     albuterol (VENTOLIN HFA) 108 (90 Base) MCG/ACT inhaler albuterol sulfate HFA 90 mcg/actuation aerosol inhaler (Patient not taking: Reported on 08/30/2022)     ALPRAZolam (XANAX) 0.25 MG tablet Take 0.25 mg by mouth 3 (three) times daily as needed.     amLODIPine-Valsartan-HCTZ 10-320-25 MG TABS TAKE 1 TABLET BY MOUTH EVERY DAY 90 tablet 3   atorvastatin (LIPITOR) 20 MG tablet TAKE 1 TABLET BY MOUTH EVERY DAY 90 tablet 3   buprenorphine (BUTRANS) 20 MCG/HR PTWK 1 patch once  a week.     citalopram (CELEXA) 20 MG tablet TAKE 1 TABLET BY MOUTH EVERY DAY 90 tablet 1   ergocalciferol (VITAMIN D2) 1.25 MG (50000 UT) capsule ergocalciferol (vitamin D2) 1,250 mcg (50,000 unit) capsule  TAKE 1 (ONE) CAPSULE BY MOUTH WEEKLY     ferrous sulfate 324 MG TBEC ferrous sulfate 324 mg (65 mg iron) tablet,delayed release  TAKE 1 TABLET BY MOUTH TWICE A DAY     furosemide (LASIX) 40 MG tablet TAKE 1.5 TABLETS BY MOUTH DAILY. 135 tablet 0   lisinopril (ZESTRIL) 10 MG tablet lisinopril 10 mg tablet  TAKE 1 TABLET BY MOUTH EVERY DAY     LYRICA 75 MG capsule Take 75 mg by mouth 2 (two) times daily.  0   Magnesium Oxide 500 MG TABS magnesium oxide 500 mg tablet  TAKE 1 TABLET BY MOUTH EVERYDAY AT BEDTIME     metoprolol tartrate (LOPRESSOR) 100 MG tablet Take two tablets ( 200 mg) in the morning and one tablet ( 100 mg) in the evening. (Patient not taking: Reported on 08/30/2022) 90 tablet 0   oxyCODONE (ROXICODONE) 15 MG immediate release tablet Take 15 mg by mouth every 8 (eight) hours as needed for pain.     phentermine 37.5 MG capsule Take 37.5 mg by mouth daily.     Semaglutide, 1 MG/DOSE, (OZEMPIC, 1 MG/DOSE,) 4 MG/3ML SOPN Ozempic 1 mg/dose (4 mg/3 mL) subcutaneous pen injector     senna-docusate (SENOKOT-S) 8.6-50 MG tablet Senna Plus 8.6 mg-50 mg tablet  2 (TWO) TABLET AS NEEDED     spironolactone (ALDACTONE) 25 MG tablet TAKE 1 TABLET BY MOUTH EVERY DAY 90 tablet 3   topiramate (TOPAMAX) 50 MG tablet topiramate 50 mg tablet     triamcinolone ointment (KENALOG) 0.5 % Apply 1 Application topically 2 (two) times daily. 30 g 1   No current facility-administered medications for this visit.    REVIEW OF SYSTEMS:   Constitutional: ( - ) fevers, ( - )  chills , ( - ) night sweats Eyes: ( - ) blurriness of vision, ( - ) double vision, ( - ) watery eyes Ears, nose, mouth, throat, and face: ( - ) mucositis, ( - ) sore throat Respiratory: ( - ) cough, ( - ) dyspnea, ( - )  wheezes Cardiovascular: ( - ) palpitation, ( - ) chest discomfort, ( - ) lower extremity swelling Gastrointestinal:  ( - ) nausea, ( - ) heartburn, ( - ) change in bowel habits Skin: ( - ) abnormal skin rashes Lymphatics: ( - ) new lymphadenopathy, ( - ) easy bruising Neurological: ( - ) numbness, ( - ) tingling, ( - ) new weaknesses Behavioral/Psych: ( - ) mood change, ( - ) new  changes  All other systems were reviewed with the patient and are negative.  PHYSICAL EXAMINATION:  Vitals:   01/05/23 1009  BP: (!) 169/95  Pulse: (!) 116  Resp: 18  Temp: 98.1 F (36.7 C)  SpO2: 97%   Filed Weights   01/05/23 1009  Weight: (!) 373 lb 3.2 oz (169.3 kg)    GENERAL: Well-appearing obese African-American female, alert, no distress and comfortable SKIN: skin color, texture, turgor are normal, no rashes or significant lesions EYES: conjunctiva are pink and non-injected, sclera clear LUNGS: clear to auscultation and percussion with normal breathing effort HEART: regular rate & rhythm and no murmurs and no lower extremity edema Musculoskeletal: no cyanosis of digits and no clubbing  PSYCH: alert & oriented x 3, fluent speech NEURO: no focal motor/sensory deficits  LABORATORY DATA:  I have reviewed the data as listed    Latest Ref Rng & Units 01/05/2023    9:14 AM 08/18/2022   12:38 PM 01/17/2021    2:15 PM  CBC  WBC 4.0 - 10.5 K/uL 8.6  9.3  5.3   Hemoglobin 12.0 - 15.0 g/dL 8.7  9.5  9.2   Hematocrit 36.0 - 46.0 % 27.5  30.3  29.9   Platelets 150 - 400 K/uL 503  304  257        Latest Ref Rng & Units 01/05/2023    9:14 AM 08/18/2022   12:38 PM 01/17/2021    2:15 PM  CMP  Glucose 70 - 99 mg/dL 568  616  91   BUN 6 - 20 mg/dL 11  10  14    Creatinine 0.44 - 1.00 mg/dL 8.37  2.90  2.11   Sodium 135 - 145 mmol/L 138  142  142   Potassium 3.5 - 5.1 mmol/L 3.8  2.9  3.8   Chloride 98 - 111 mmol/L 103  104  107   CO2 22 - 32 mmol/L 28  27  26    Calcium 8.9 - 10.3 mg/dL 9.5  9.0   9.1   Total Protein 6.5 - 8.1 g/dL 8.5  7.5  7.4   Total Bilirubin 0.3 - 1.2 mg/dL 0.3  0.5  0.3   Alkaline Phos 38 - 126 U/L 121  98  140   AST 15 - 41 U/L 13  18  18    ALT 0 - 44 U/L 9  13  12      RADIOGRAPHIC STUDIES: No results found.  ASSESSMENT & PLAN [name] 57 y.o. female with medical history significant for iron deficiency anemia 2/2 to GYN bleeding who presents for a follow up visit.  # Iron Deficiency Anemia 2/2 to GYN Bleeding -- Findings are most consistent with iron deficiency anemia secondary to GYN bleeding,  Patient has started menopause this year and is no longer having menstrual bleeding. -- Patient was previously on iron pills but discontinued them due to symptoms. -- Recommend proceeding with IV iron as soon as is feasible. -- patient not having active menstrual cycles, concern for a possible GI source of bleeding. Will make referral to GI.  --Labs today show white blood cell count 8.6, hemoglobin 8.7, MCV 78.6, and platelets of 503 with saturation ratio 5% with ferritin of 34. -- Return to clinic approximately 4 to 6 weeks after last dose of IV iron.  No orders of the defined types were placed in this encounter.   All questions were answered. The patient knows to call the clinic with any problems, questions or  concerns.  A total of more than 30 minutes were spent on this encounter with face-to-face time and non-face-to-face time, including preparing to see the patient, ordering tests and/or medications, counseling the patient and coordination of care as outlined above.   Ulysees Barns, MD Department of Hematology/Oncology [facility] at [facility] Phone: [phone] Pager: [phone] Email: [email or web address]  01/06/2023 5:09 PM

## 2023-01-05 NOTE — Telephone Encounter (Signed)
Dr. Leonides Schanz, Monoferric is non-preferred medication and will be denied if patient has not failed preferred medications. Preferred medications are Venofer Infed Ferllecit  Would you like to try venofer?

## 2023-01-06 ENCOUNTER — Encounter: Payer: Self-pay | Admitting: Hematology

## 2023-01-06 ENCOUNTER — Encounter: Payer: Self-pay | Admitting: Hematology and Oncology

## 2023-01-08 ENCOUNTER — Other Ambulatory Visit: Payer: Self-pay | Admitting: Pharmacy Technician

## 2023-01-15 ENCOUNTER — Ambulatory Visit (INDEPENDENT_AMBULATORY_CARE_PROVIDER_SITE_OTHER): Payer: Medicare HMO

## 2023-01-15 VITALS — BP 167/106 | HR 99 | Temp 97.5°F | Resp 20 | Ht 70.0 in | Wt 379.4 lb

## 2023-01-15 DIAGNOSIS — N920 Excessive and frequent menstruation with regular cycle: Secondary | ICD-10-CM

## 2023-01-15 DIAGNOSIS — D500 Iron deficiency anemia secondary to blood loss (chronic): Secondary | ICD-10-CM

## 2023-01-15 MED ORDER — DIPHENHYDRAMINE HCL 25 MG PO CAPS
25.0000 mg | ORAL_CAPSULE | Freq: Once | ORAL | Status: AC
  Administered 2023-01-15: 25 mg via ORAL
  Filled 2023-01-15: qty 1

## 2023-01-15 MED ORDER — SODIUM CHLORIDE 0.9 % IV SOLN
200.0000 mg | Freq: Once | INTRAVENOUS | Status: AC
  Administered 2023-01-15: 200 mg via INTRAVENOUS
  Filled 2023-01-15: qty 10

## 2023-01-15 MED ORDER — ACETAMINOPHEN 325 MG PO TABS
650.0000 mg | ORAL_TABLET | Freq: Once | ORAL | Status: AC
  Administered 2023-01-15: 650 mg via ORAL
  Filled 2023-01-15: qty 2

## 2023-01-15 NOTE — Patient Instructions (Signed)

## 2023-01-15 NOTE — Progress Notes (Addendum)
Diagnosis: Iron Deficiency Anemia  Provider:  Chilton Greathouse MD  Procedure: Infusion  IV Type: Peripheral, IV Location: L Antecubital  Venofer (Iron Sucrose), Dose: 200 mg  Infusion Start Time: 1223  Infusion Stop Time: 1245  Post Infusion IV Care: Observation period completed and Peripheral IV Discontinued  Pt Blood pressure elevated pre and post infusion, Per pt, Pt will take her  BP med once she reached  home.. Advised pt to contact her Dr regarding her BP.    Discharge: Condition: Good, Destination: Home . AVS Provided  Performed by:  Adriana Mccallum, RN

## 2023-01-22 ENCOUNTER — Ambulatory Visit (INDEPENDENT_AMBULATORY_CARE_PROVIDER_SITE_OTHER): Payer: Medicare HMO

## 2023-01-22 VITALS — BP 175/82 | HR 87 | Temp 98.3°F | Resp 16 | Ht 70.0 in | Wt 381.2 lb

## 2023-01-22 DIAGNOSIS — D500 Iron deficiency anemia secondary to blood loss (chronic): Secondary | ICD-10-CM

## 2023-01-22 DIAGNOSIS — N920 Excessive and frequent menstruation with regular cycle: Secondary | ICD-10-CM | POA: Diagnosis not present

## 2023-01-22 MED ORDER — DIPHENHYDRAMINE HCL 25 MG PO CAPS: 25.0000 mg | ORAL_CAPSULE | Freq: Once | ORAL | Status: DC

## 2023-01-22 MED ORDER — ACETAMINOPHEN 325 MG PO TABS: 650.0000 mg | ORAL_TABLET | Freq: Once | ORAL | Status: DC

## 2023-01-22 MED ORDER — SODIUM CHLORIDE 0.9 % IV SOLN
200.0000 mg | Freq: Once | INTRAVENOUS | Status: AC
  Administered 2023-01-22: 200 mg via INTRAVENOUS
  Filled 2023-01-22: qty 10

## 2023-01-22 NOTE — Progress Notes (Signed)
Diagnosis: Iron Deficiency Anemia  Provider:  Chilton Greathouse MD  Procedure: IV Infusion  IV Type: Peripheral, IV Location: R Forearm  Venofer (Iron Sucrose), Dose: 200 mg  Infusion Start Time: 1405  Infusion Stop Time: 1420  Post Infusion IV Care: Peripheral IV Discontinued  Discharge: Condition: Good, Destination: Home . AVS Declined  Performed by:  Loney Hering, LPN

## 2023-01-25 DIAGNOSIS — F419 Anxiety disorder, unspecified: Secondary | ICD-10-CM | POA: Diagnosis not present

## 2023-01-25 DIAGNOSIS — L0390 Cellulitis, unspecified: Secondary | ICD-10-CM | POA: Diagnosis not present

## 2023-01-25 DIAGNOSIS — M5137 Other intervertebral disc degeneration, lumbosacral region: Secondary | ICD-10-CM | POA: Diagnosis not present

## 2023-01-25 DIAGNOSIS — M545 Low back pain, unspecified: Secondary | ICD-10-CM | POA: Diagnosis not present

## 2023-01-25 DIAGNOSIS — E559 Vitamin D deficiency, unspecified: Secondary | ICD-10-CM | POA: Diagnosis not present

## 2023-01-25 DIAGNOSIS — F331 Major depressive disorder, recurrent, moderate: Secondary | ICD-10-CM | POA: Diagnosis not present

## 2023-01-25 DIAGNOSIS — Z760 Encounter for issue of repeat prescription: Secondary | ICD-10-CM | POA: Diagnosis not present

## 2023-01-25 DIAGNOSIS — I10 Essential (primary) hypertension: Secondary | ICD-10-CM | POA: Diagnosis not present

## 2023-01-25 DIAGNOSIS — Z79899 Other long term (current) drug therapy: Secondary | ICD-10-CM | POA: Diagnosis not present

## 2023-01-25 DIAGNOSIS — E119 Type 2 diabetes mellitus without complications: Secondary | ICD-10-CM | POA: Diagnosis not present

## 2023-01-25 DIAGNOSIS — G894 Chronic pain syndrome: Secondary | ICD-10-CM | POA: Diagnosis not present

## 2023-01-29 ENCOUNTER — Ambulatory Visit (INDEPENDENT_AMBULATORY_CARE_PROVIDER_SITE_OTHER): Payer: Medicare HMO

## 2023-01-29 VITALS — BP 154/91 | HR 88 | Temp 98.7°F | Resp 16 | Ht 70.0 in | Wt 372.0 lb

## 2023-01-29 DIAGNOSIS — D500 Iron deficiency anemia secondary to blood loss (chronic): Secondary | ICD-10-CM

## 2023-01-29 DIAGNOSIS — Z79899 Other long term (current) drug therapy: Secondary | ICD-10-CM | POA: Diagnosis not present

## 2023-01-29 DIAGNOSIS — N920 Excessive and frequent menstruation with regular cycle: Secondary | ICD-10-CM | POA: Diagnosis not present

## 2023-01-29 MED ORDER — ACETAMINOPHEN 325 MG PO TABS: 650.0000 mg | ORAL_TABLET | Freq: Once | ORAL | Status: DC

## 2023-01-29 MED ORDER — SODIUM CHLORIDE 0.9 % IV SOLN
200.0000 mg | Freq: Once | INTRAVENOUS | Status: AC
  Administered 2023-01-29: 200 mg via INTRAVENOUS
  Filled 2023-01-29: qty 10

## 2023-01-29 MED ORDER — DIPHENHYDRAMINE HCL 25 MG PO CAPS: 25.0000 mg | ORAL_CAPSULE | Freq: Once | ORAL | Status: DC

## 2023-01-29 NOTE — Progress Notes (Signed)
Diagnosis: Iron Deficiency Anemia  Provider:  Chilton Greathouse MD  Procedure: IV Infusion  IV Type: Peripheral, IV Location: L Forearm  Venofer (Iron Sucrose), Dose: 200 mg  Infusion Start Time: 1003  Infusion Stop Time: 1023  Post Infusion IV Care: Peripheral IV Discontinued  Discharge: Condition: Good, Destination: Home . AVS Declined  Performed by:  Marlow Baars [place]tt, RN

## 2023-01-31 ENCOUNTER — Encounter: Payer: Self-pay | Admitting: Hematology and Oncology

## 2023-01-31 ENCOUNTER — Encounter: Payer: Self-pay | Admitting: Hematology

## 2023-02-05 ENCOUNTER — Ambulatory Visit (INDEPENDENT_AMBULATORY_CARE_PROVIDER_SITE_OTHER): Payer: Medicare HMO

## 2023-02-05 VITALS — BP 133/74 | HR 93 | Temp 97.9°F | Resp 18 | Ht 70.0 in | Wt 365.4 lb

## 2023-02-05 DIAGNOSIS — D500 Iron deficiency anemia secondary to blood loss (chronic): Secondary | ICD-10-CM

## 2023-02-05 DIAGNOSIS — N920 Excessive and frequent menstruation with regular cycle: Secondary | ICD-10-CM

## 2023-02-05 MED ORDER — ACETAMINOPHEN 325 MG PO TABS: 650.0000 mg | ORAL_TABLET | Freq: Once | ORAL | Status: DC

## 2023-02-05 MED ORDER — DIPHENHYDRAMINE HCL 25 MG PO CAPS: 25.0000 mg | ORAL_CAPSULE | Freq: Once | ORAL | Status: DC

## 2023-02-05 MED ORDER — SODIUM CHLORIDE 0.9 % IV SOLN
200.0000 mg | Freq: Once | INTRAVENOUS | Status: AC
  Administered 2023-02-05: 200 mg via INTRAVENOUS
  Filled 2023-02-05: qty 10

## 2023-02-05 NOTE — Progress Notes (Signed)
Diagnosis: Iron Deficiency Anemia  Provider:  Chilton Greathouse MD  Procedure: IV Infusion  IV Type: Peripheral, IV Location: L Forearm  Venofer (Iron Sucrose), Dose: 200 mg  Infusion Start Time: 0959  Infusion Stop Time: 1018  Post Infusion IV Care: Peripheral IV Discontinued  Discharge: Condition: Good, Destination: Home . AVS Declined  Performed by:  Garnette Czech, RN

## 2023-02-12 ENCOUNTER — Ambulatory Visit (INDEPENDENT_AMBULATORY_CARE_PROVIDER_SITE_OTHER): Payer: Medicare HMO

## 2023-02-12 VITALS — BP 154/88 | HR 88 | Temp 98.0°F | Resp 18 | Ht 70.0 in | Wt 381.4 lb

## 2023-02-12 DIAGNOSIS — D509 Iron deficiency anemia, unspecified: Secondary | ICD-10-CM | POA: Diagnosis not present

## 2023-02-12 DIAGNOSIS — D500 Iron deficiency anemia secondary to blood loss (chronic): Secondary | ICD-10-CM

## 2023-02-12 MED ORDER — ACETAMINOPHEN 325 MG PO TABS: 650.0000 mg | ORAL_TABLET | Freq: Once | ORAL | Status: DC

## 2023-02-12 MED ORDER — SODIUM CHLORIDE 0.9 % IV SOLN
200.0000 mg | Freq: Once | INTRAVENOUS | Status: AC
  Administered 2023-02-12: 200 mg via INTRAVENOUS
  Filled 2023-02-12: qty 10

## 2023-02-12 MED ORDER — DIPHENHYDRAMINE HCL 25 MG PO CAPS: 25.0000 mg | ORAL_CAPSULE | Freq: Once | ORAL | Status: DC

## 2023-02-12 NOTE — Progress Notes (Signed)
Diagnosis: Iron Deficiency Anemia  Provider:  Chilton Greathouse MD  Procedure: IV Infusion  IV Type: Peripheral, IV Location: L Forearm  Venofer (Iron Sucrose), Dose: 200 mg  Infusion Start Time: 0949  Infusion Stop Time: 1010  Post Infusion IV Care: Peripheral IV Discontinued  Discharge: Condition: Good, Destination: Home . AVS Provided  Performed by:  Garnette Czech, RN

## 2023-02-23 DIAGNOSIS — F419 Anxiety disorder, unspecified: Secondary | ICD-10-CM | POA: Diagnosis not present

## 2023-02-23 DIAGNOSIS — Z79899 Other long term (current) drug therapy: Secondary | ICD-10-CM | POA: Diagnosis not present

## 2023-02-23 DIAGNOSIS — Z6841 Body Mass Index (BMI) 40.0 and over, adult: Secondary | ICD-10-CM | POA: Diagnosis not present

## 2023-02-23 DIAGNOSIS — M5137 Other intervertebral disc degeneration, lumbosacral region: Secondary | ICD-10-CM | POA: Diagnosis not present

## 2023-02-23 DIAGNOSIS — F331 Major depressive disorder, recurrent, moderate: Secondary | ICD-10-CM | POA: Diagnosis not present

## 2023-02-23 DIAGNOSIS — E559 Vitamin D deficiency, unspecified: Secondary | ICD-10-CM | POA: Diagnosis not present

## 2023-02-23 DIAGNOSIS — I10 Essential (primary) hypertension: Secondary | ICD-10-CM | POA: Diagnosis not present

## 2023-02-23 DIAGNOSIS — G894 Chronic pain syndrome: Secondary | ICD-10-CM | POA: Diagnosis not present

## 2023-02-23 DIAGNOSIS — E119 Type 2 diabetes mellitus without complications: Secondary | ICD-10-CM | POA: Diagnosis not present

## 2023-02-27 DIAGNOSIS — Z79899 Other long term (current) drug therapy: Secondary | ICD-10-CM | POA: Diagnosis not present

## 2023-03-09 ENCOUNTER — Other Ambulatory Visit: Payer: Self-pay | Admitting: Hematology and Oncology

## 2023-03-09 ENCOUNTER — Inpatient Hospital Stay: Payer: Medicare HMO | Attending: Hematology and Oncology

## 2023-03-09 ENCOUNTER — Other Ambulatory Visit: Payer: Self-pay

## 2023-03-09 ENCOUNTER — Inpatient Hospital Stay: Payer: Medicare HMO | Admitting: Hematology and Oncology

## 2023-03-09 VITALS — BP 187/98 | HR 101 | Temp 97.2°F | Resp 19 | Ht 70.0 in | Wt 374.5 lb

## 2023-03-09 DIAGNOSIS — D500 Iron deficiency anemia secondary to blood loss (chronic): Secondary | ICD-10-CM | POA: Diagnosis not present

## 2023-03-09 DIAGNOSIS — Z79899 Other long term (current) drug therapy: Secondary | ICD-10-CM | POA: Diagnosis not present

## 2023-03-09 DIAGNOSIS — Z803 Family history of malignant neoplasm of breast: Secondary | ICD-10-CM | POA: Insufficient documentation

## 2023-03-09 DIAGNOSIS — Z8042 Family history of malignant neoplasm of prostate: Secondary | ICD-10-CM | POA: Diagnosis not present

## 2023-03-09 DIAGNOSIS — N920 Excessive and frequent menstruation with regular cycle: Secondary | ICD-10-CM | POA: Insufficient documentation

## 2023-03-09 LAB — IRON AND IRON BINDING CAPACITY (CC-WL,HP ONLY)
Iron: 35 ug/dL (ref 28–170)
Saturation Ratios: 10 % — ABNORMAL LOW (ref 10.4–31.8)
TIBC: 344 ug/dL (ref 250–450)
UIBC: 309 ug/dL (ref 148–442)

## 2023-03-09 LAB — RETIC PANEL
Immature Retic Fract: 11.2 % (ref 2.3–15.9)
RBC.: 3.95 MIL/uL (ref 3.87–5.11)
Retic Count, Absolute: 49.8 10*3/uL (ref 19.0–186.0)
Retic Ct Pct: 1.3 % (ref 0.4–3.1)
Reticulocyte Hemoglobin: 29.9 pg (ref >27.9)

## 2023-03-09 LAB — CMP (CANCER CENTER ONLY)
ALT: 9 U/L (ref 0–44)
AST: 17 U/L (ref 15–41)
Albumin: 3.6 g/dL (ref 3.5–5.0)
Alkaline Phosphatase: 116 U/L (ref 38–126)
Anion gap: 5 (ref 5–15)
BUN: 12 mg/dL (ref 6–20)
CO2: 30 mmol/L (ref 22–32)
Calcium: 9.3 mg/dL (ref 8.9–10.3)
Chloride: 104 mmol/L (ref 98–111)
Creatinine: 0.63 mg/dL (ref 0.44–1.00)
GFR, Estimated: >60 mL/min (ref >60)
Glucose, Bld: 105 mg/dL — ABNORMAL HIGH (ref 70–99)
Potassium: 3.9 mmol/L (ref 3.5–5.1)
Sodium: 139 mmol/L (ref 135–145)
Total Bilirubin: 0.3 mg/dL (ref 0.3–1.2)
Total Protein: 7.6 g/dL (ref 6.5–8.1)

## 2023-03-09 LAB — CBC WITH DIFFERENTIAL (CANCER CENTER ONLY)
Abs Immature Granulocytes: 0.01 10*3/uL (ref 0.00–0.07)
Basophils Absolute: 0 10*3/uL (ref 0.0–0.1)
Basophils Relative: 1 %
Eosinophils Absolute: 0.2 10*3/uL (ref 0.0–0.5)
Eosinophils Relative: 4 %
HCT: 33.1 % — ABNORMAL LOW (ref 36.0–46.0)
Hemoglobin: 10.4 g/dL — ABNORMAL LOW (ref 12.0–15.0)
Immature Granulocytes: 0 %
Lymphocytes Relative: 32 %
Lymphs Abs: 2 10*3/uL (ref 0.7–4.0)
MCH: 26.4 pg (ref 26.0–34.0)
MCHC: 31.4 g/dL (ref 30.0–36.0)
MCV: 84 fL (ref 80.0–100.0)
Monocytes Absolute: 0.6 10*3/uL (ref 0.1–1.0)
Monocytes Relative: 9 %
Neutro Abs: 3.3 10*3/uL (ref 1.7–7.7)
Neutrophils Relative %: 54 %
Platelet Count: 287 10*3/uL (ref 150–400)
RBC: 3.94 MIL/uL (ref 3.87–5.11)
RDW: 19.7 % — ABNORMAL HIGH (ref 11.5–15.5)
WBC Count: 6.1 10*3/uL (ref 4.0–10.5)
nRBC: 0 % (ref 0.0–0.2)

## 2023-03-09 LAB — FERRITIN: Ferritin: 73 ng/mL (ref 11–307)

## 2023-03-09 NOTE — Progress Notes (Signed)
[name]   [phone]  PROGRESS NOTE  Patient Care Team: Pcp, No as PCP - General  Hematological/Oncological History # Iron Deficiency Anemia 2/2 to GYN Bleeding 02/13/2020: WBC 5.6, hgb 9.7, MCV 87, Plt 295 01/03/2021: WBC 5.2, Hgb 10.7, MCV 87, Plt 276 01/17/2021: establish care with Dr. Leonides Schanz  02/04/2021-03/04/2021: IV venofer 200 mg x 5 doses. Lost to Follow up.  01/15/2023-02/12/2023: IV venofer 200 mg x 5 doses  Interval History:  [name] 57 y.o. female with medical history significant for iron deficiency anemia 2/2 to GYN bleeding who presents for a follow up visit. The patient's last visit was on 01/05/2023. In the interim she received 5 doses of IV venofer.   On exam today [name] reports she tolerated her Venofer infusions well in April and May.  She notes that she does not feel any different as result of the infusions.  She tolerated them well without any side effects as result of the infusion.  She reports she continues to be quite tired.  She notes that the sores on her legs are definitely healing better with the improved iron levels, but she just does not feel any better.  She notes she is not having any lightheadedness, dizziness, or shortness of breath.  She notes that she is still craving and eating ice but not as much as before.  She is not having any overt signs of bleeding anywhere and is not having any menstrual bleeding.  She is doing her best to try to eat iron rich foods and is doing so about 2-3 times per week.  She otherwise denies any nausea, vomiting, diarrhea, fevers, chills, sweats.  A full 10 point ROS is otherwise negative.  MEDICAL HISTORY:  Past Medical History:  Diagnosis Date   CHF (congestive heart failure) (HCC)    DVT (deep venous thrombosis) (HCC)    Headache(784.0)    History of chicken pox    Hypertension    Morbid obesity (HCC)    Osteoarthritis     SURGICAL HISTORY: Past Surgical  History:  Procedure Laterality Date   TUBAL LIGATION  1985    SOCIAL HISTORY: Social History   Socioeconomic History   Marital status: Divorced    Spouse name: Not on file   Number of children: Not on file   Years of education: Not on file   Highest education level: Not on file  Occupational History   Occupation: Warehouse    Employer: [employer]  Tobacco Use   Smoking status: Never   Smokeless tobacco: Never  Substance and Sexual Activity   Alcohol use: No    Alcohol/week: 0.0 standard drinks of alcohol   Drug use: No   Sexual activity: Not on file  Other Topics Concern   Not on file  Social History Narrative   Currently unemployed    Has one daughter in [place]    Social Determinants of Health   Financial Resource Strain: Not on file  Food Insecurity: Not on file  Transportation Needs: Not on file  Physical Activity: Not on file  Stress: Not on file  Social Connections: Not on file  Intimate Partner Violence: Not on file    FAMILY HISTORY: Family History  Problem Relation Age of Onset   Arthritis Mother    Hypertension Mother    Prostate cancer Maternal Grandfather    Breast cancer Maternal Grandmother     ALLERGIES:  has No Known Allergies.  MEDICATIONS:  Current Outpatient Medications  Medication Sig Dispense Refill   acetaminophen (TYLENOL) 500 MG tablet Take 500 mg by mouth every 6 (six) hours as needed for mild pain.     albuterol (VENTOLIN HFA) 108 (90 Base) MCG/ACT inhaler albuterol sulfate HFA 90 mcg/actuation aerosol inhaler (Patient not taking: Reported on 08/30/2022)     ALPRAZolam (XANAX) 0.25 MG tablet Take 0.25 mg by mouth 3 (three) times daily as needed.     amLODIPine-Valsartan-HCTZ 10-320-25 MG TABS TAKE 1 TABLET BY MOUTH EVERY DAY 90 tablet 3   atorvastatin (LIPITOR) 20 MG tablet TAKE 1 TABLET BY MOUTH EVERY DAY 90 tablet 3   buprenorphine (BUTRANS) 20 MCG/HR PTWK 1 patch once a week.     citalopram (CELEXA) 20 MG tablet TAKE 1  TABLET BY MOUTH EVERY DAY 90 tablet 1   ergocalciferol (VITAMIN D2) 1.25 MG (50000 UT) capsule ergocalciferol (vitamin D2) 1,250 mcg (50,000 unit) capsule  TAKE 1 (ONE) CAPSULE BY MOUTH WEEKLY     ferrous sulfate 324 MG TBEC ferrous sulfate 324 mg (65 mg iron) tablet,delayed release  TAKE 1 TABLET BY MOUTH TWICE A DAY     furosemide (LASIX) 40 MG tablet TAKE 1.5 TABLETS BY MOUTH DAILY. 135 tablet 0   lisinopril (ZESTRIL) 10 MG tablet lisinopril 10 mg tablet  TAKE 1 TABLET BY MOUTH EVERY DAY     LYRICA 75 MG capsule Take 75 mg by mouth 2 (two) times daily.  0   Magnesium Oxide 500 MG TABS magnesium oxide 500 mg tablet  TAKE 1 TABLET BY MOUTH EVERYDAY AT BEDTIME     metoprolol tartrate (LOPRESSOR) 100 MG tablet Take two tablets ( 200 mg) in the morning and one tablet ( 100 mg) in the evening. (Patient not taking: Reported on 08/30/2022) 90 tablet 0   oxyCODONE (ROXICODONE) 15 MG immediate release tablet Take 15 mg by mouth every 8 (eight) hours as needed for pain.     phentermine 37.5 MG capsule Take 37.5 mg by mouth daily.     Semaglutide, 1 MG/DOSE, (OZEMPIC, 1 MG/DOSE,) 4 MG/3ML SOPN Ozempic 1 mg/dose (4 mg/3 mL) subcutaneous pen injector     senna-docusate (SENOKOT-S) 8.6-50 MG tablet Senna Plus 8.6 mg-50 mg tablet  2 (TWO) TABLET AS NEEDED     spironolactone (ALDACTONE) 25 MG tablet TAKE 1 TABLET BY MOUTH EVERY DAY 90 tablet 3   topiramate (TOPAMAX) 50 MG tablet topiramate 50 mg tablet     triamcinolone ointment (KENALOG) 0.5 % Apply 1 Application topically 2 (two) times daily. 30 g 1   No current facility-administered medications for this visit.    REVIEW OF SYSTEMS:   Constitutional: ( - ) fevers, ( - )  chills , ( - ) night sweats Eyes: ( - ) blurriness of vision, ( - ) double vision, ( - ) watery eyes Ears, nose, mouth, throat, and face: ( - ) mucositis, ( - ) sore throat Respiratory: ( - ) cough, ( - ) dyspnea, ( - ) wheezes Cardiovascular: ( - ) palpitation, ( - ) chest  discomfort, ( - ) lower extremity swelling Gastrointestinal:  ( - ) nausea, ( - ) heartburn, ( - ) change in bowel habits Skin: ( - ) abnormal skin rashes Lymphatics: ( - ) new lymphadenopathy, ( - ) easy bruising Neurological: ( - ) numbness, ( - ) tingling, ( - ) new weaknesses Behavioral/Psych: ( - ) mood change, ( - ) new changes  All other systems were reviewed with the  patient and are negative.  PHYSICAL EXAMINATION:  Vitals:   03/09/23 0908  BP: (!) 187/98  Pulse: (!) 101  Resp: 19  Temp: (!) 97.2 F (36.2 C)  SpO2: 98%    Filed Weights   03/09/23 0908  Weight: (!) 374 lb 8 oz (169.9 kg)     GENERAL: Well-appearing obese African-American female, alert, no distress and comfortable SKIN: skin color, texture, turgor are normal, no rashes or significant lesions EYES: conjunctiva are pink and non-injected, sclera clear LUNGS: clear to auscultation and percussion with normal breathing effort HEART: regular rate & rhythm and no murmurs and no lower extremity edema Musculoskeletal: no cyanosis of digits and no clubbing  PSYCH: alert & oriented x 3, fluent speech NEURO: no focal motor/sensory deficits  LABORATORY DATA:  I have reviewed the data as listed    Latest Ref Rng & Units 03/09/2023    8:50 AM 01/05/2023    9:14 AM 08/18/2022   12:38 PM  CBC  WBC 4.0 - 10.5 K/uL 6.1  8.6  9.3   Hemoglobin 12.0 - 15.0 g/dL 16.1  8.7  9.5   Hematocrit 36.0 - 46.0 % 33.1  27.5  30.3   Platelets 150 - 400 K/uL 287  503  304        Latest Ref Rng & Units 03/09/2023    8:50 AM 01/05/2023    9:14 AM 08/18/2022   12:38 PM  CMP  Glucose 70 - 99 mg/dL 096  045  409   BUN 6 - 20 mg/dL 12  11  10    Creatinine 0.44 - 1.00 mg/dL 8.11  9.14  7.82   Sodium 135 - 145 mmol/L 139  138  142   Potassium 3.5 - 5.1 mmol/L 3.9  3.8  2.9   Chloride 98 - 111 mmol/L 104  103  104   CO2 22 - 32 mmol/L 30  28  27    Calcium 8.9 - 10.3 mg/dL 9.3  9.5  9.0   Total Protein 6.5 - 8.1 g/dL 7.6  8.5  7.5    Total Bilirubin 0.3 - 1.2 mg/dL 0.3  0.3  0.5   Alkaline Phos 38 - 126 U/L 116  121  98   AST 15 - 41 U/L 17  13  18    ALT 0 - 44 U/L 9  9  13      RADIOGRAPHIC STUDIES: No results found.  ASSESSMENT & PLAN JANVI AMMAR 57 y.o. female with medical history significant for iron deficiency anemia 2/2 to GYN bleeding who presents for a follow up visit.  # Iron Deficiency Anemia 2/2 to GYN Bleeding -- Findings are most consistent with iron deficiency anemia secondary to GYN bleeding,  Patient has started menopause this year and is no longer having menstrual bleeding. -- Patient was previously on iron pills but discontinued them due to symptoms. -- Recommend proceeding with IV iron if she is persistently iron deficient.  -- patient not having active menstrual cycles, concern for a possible GI source of bleeding. Will make referral to GI.  --Labs today show white blood cell count 6.1, Hgb 10.4, MCV 84, Plt 287 -- Return to clinic pending results of iron studies. Placeholder visit in 3 months time.   Orders Placed This Encounter  Procedures   Ambulatory referral to Gastroenterology    Referral Priority:   Routine    Referral Type:   Consultation    Referral Reason:   Specialty Services Required    Number  of Visits Requested:   1    All questions were answered. The patient knows to call the clinic with any problems, questions or concerns.  A total of more than 30 minutes were spent on this encounter with face-to-face time and non-face-to-face time, including preparing to see the patient, ordering tests and/or medications, counseling the patient and coordination of care as outlined above.   Ulysees Barns, MD Department of Hematology/Oncology [facility] at [facility] Phone: [phone] Pager: [phone] Email: [email or web address]  03/20/2023 10:03 AM

## 2023-03-20 ENCOUNTER — Encounter: Payer: Self-pay | Admitting: Hematology and Oncology

## 2023-03-20 ENCOUNTER — Encounter: Payer: Self-pay | Admitting: Hematology

## 2023-03-23 DIAGNOSIS — I10 Essential (primary) hypertension: Secondary | ICD-10-CM | POA: Diagnosis not present

## 2023-03-23 DIAGNOSIS — Z79899 Other long term (current) drug therapy: Secondary | ICD-10-CM | POA: Diagnosis not present

## 2023-03-23 DIAGNOSIS — R600 Localized edema: Secondary | ICD-10-CM | POA: Diagnosis not present

## 2023-03-23 DIAGNOSIS — M79641 Pain in right hand: Secondary | ICD-10-CM | POA: Diagnosis not present

## 2023-03-23 DIAGNOSIS — F331 Major depressive disorder, recurrent, moderate: Secondary | ICD-10-CM | POA: Diagnosis not present

## 2023-03-23 DIAGNOSIS — E559 Vitamin D deficiency, unspecified: Secondary | ICD-10-CM | POA: Diagnosis not present

## 2023-03-23 DIAGNOSIS — K219 Gastro-esophageal reflux disease without esophagitis: Secondary | ICD-10-CM | POA: Diagnosis not present

## 2023-03-23 DIAGNOSIS — E119 Type 2 diabetes mellitus without complications: Secondary | ICD-10-CM | POA: Diagnosis not present

## 2023-03-23 DIAGNOSIS — M5137 Other intervertebral disc degeneration, lumbosacral region: Secondary | ICD-10-CM | POA: Diagnosis not present

## 2023-03-23 DIAGNOSIS — E7800 Pure hypercholesterolemia, unspecified: Secondary | ICD-10-CM | POA: Diagnosis not present

## 2023-03-23 DIAGNOSIS — M129 Arthropathy, unspecified: Secondary | ICD-10-CM | POA: Diagnosis not present

## 2023-03-23 DIAGNOSIS — D509 Iron deficiency anemia, unspecified: Secondary | ICD-10-CM | POA: Diagnosis not present

## 2023-03-23 DIAGNOSIS — R5383 Other fatigue: Secondary | ICD-10-CM | POA: Diagnosis not present

## 2023-03-27 DIAGNOSIS — Z79899 Other long term (current) drug therapy: Secondary | ICD-10-CM | POA: Diagnosis not present

## 2023-03-28 ENCOUNTER — Encounter: Payer: Self-pay | Admitting: Hematology and Oncology

## 2023-04-30 DIAGNOSIS — F419 Anxiety disorder, unspecified: Secondary | ICD-10-CM | POA: Diagnosis not present

## 2023-04-30 DIAGNOSIS — Z79899 Other long term (current) drug therapy: Secondary | ICD-10-CM | POA: Diagnosis not present

## 2023-04-30 DIAGNOSIS — Z6841 Body Mass Index (BMI) 40.0 and over, adult: Secondary | ICD-10-CM | POA: Diagnosis not present

## 2023-04-30 DIAGNOSIS — M5137 Other intervertebral disc degeneration, lumbosacral region: Secondary | ICD-10-CM | POA: Diagnosis not present

## 2023-04-30 DIAGNOSIS — R030 Elevated blood-pressure reading, without diagnosis of hypertension: Secondary | ICD-10-CM | POA: Diagnosis not present

## 2023-04-30 DIAGNOSIS — E119 Type 2 diabetes mellitus without complications: Secondary | ICD-10-CM | POA: Diagnosis not present

## 2023-05-03 DIAGNOSIS — Z79899 Other long term (current) drug therapy: Secondary | ICD-10-CM | POA: Diagnosis not present

## 2023-05-09 DIAGNOSIS — E119 Type 2 diabetes mellitus without complications: Secondary | ICD-10-CM | POA: Diagnosis not present

## 2023-05-09 DIAGNOSIS — I10 Essential (primary) hypertension: Secondary | ICD-10-CM | POA: Diagnosis not present

## 2023-05-09 DIAGNOSIS — E7800 Pure hypercholesterolemia, unspecified: Secondary | ICD-10-CM | POA: Diagnosis not present

## 2023-05-09 DIAGNOSIS — Z6841 Body Mass Index (BMI) 40.0 and over, adult: Secondary | ICD-10-CM | POA: Diagnosis not present

## 2023-05-09 DIAGNOSIS — L0390 Cellulitis, unspecified: Secondary | ICD-10-CM | POA: Diagnosis not present

## 2023-05-09 DIAGNOSIS — R600 Localized edema: Secondary | ICD-10-CM | POA: Diagnosis not present

## 2023-05-24 ENCOUNTER — Telehealth: Payer: Self-pay | Admitting: Hematology and Oncology

## 2023-05-25 ENCOUNTER — Inpatient Hospital Stay: Payer: Medicare HMO | Attending: Hematology and Oncology

## 2023-05-25 ENCOUNTER — Other Ambulatory Visit: Payer: Self-pay | Admitting: Hematology and Oncology

## 2023-05-25 DIAGNOSIS — N920 Excessive and frequent menstruation with regular cycle: Secondary | ICD-10-CM | POA: Diagnosis not present

## 2023-05-25 DIAGNOSIS — D500 Iron deficiency anemia secondary to blood loss (chronic): Secondary | ICD-10-CM

## 2023-05-25 LAB — RETIC PANEL
Immature Retic Fract: 9.6 % (ref 2.3–15.9)
RBC.: 3.81 MIL/uL — ABNORMAL LOW (ref 3.87–5.11)
Retic Count, Absolute: 43.8 10*3/uL (ref 19.0–186.0)
Retic Ct Pct: 1.2 % (ref 0.4–3.1)
Reticulocyte Hemoglobin: 31 pg (ref >27.9)

## 2023-05-25 LAB — CBC WITH DIFFERENTIAL (CANCER CENTER ONLY)
Abs Immature Granulocytes: 0.01 10*3/uL (ref 0.00–0.07)
Basophils Absolute: 0.1 10*3/uL (ref 0.0–0.1)
Basophils Relative: 1 %
Eosinophils Absolute: 0.3 10*3/uL (ref 0.0–0.5)
Eosinophils Relative: 4 %
HCT: 33 % — ABNORMAL LOW (ref 36.0–46.0)
Hemoglobin: 10.5 g/dL — ABNORMAL LOW (ref 12.0–15.0)
Immature Granulocytes: 0 %
Lymphocytes Relative: 46 %
Lymphs Abs: 2.6 10*3/uL (ref 0.7–4.0)
MCH: 27.5 pg (ref 26.0–34.0)
MCHC: 31.8 g/dL (ref 30.0–36.0)
MCV: 86.4 fL (ref 80.0–100.0)
Monocytes Absolute: 0.6 10*3/uL (ref 0.1–1.0)
Monocytes Relative: 11 %
Neutro Abs: 2.2 10*3/uL (ref 1.7–7.7)
Neutrophils Relative %: 38 %
Platelet Count: 274 10*3/uL (ref 150–400)
RBC: 3.82 MIL/uL — ABNORMAL LOW (ref 3.87–5.11)
RDW: 15.8 % — ABNORMAL HIGH (ref 11.5–15.5)
WBC Count: 5.7 10*3/uL (ref 4.0–10.5)
nRBC: 0 % (ref 0.0–0.2)

## 2023-05-25 LAB — CMP (CANCER CENTER ONLY)
ALT: 11 U/L (ref 0–44)
AST: 19 U/L (ref 15–41)
Albumin: 3.9 g/dL (ref 3.5–5.0)
Alkaline Phosphatase: 117 U/L (ref 38–126)
Anion gap: 6 (ref 5–15)
BUN: 14 mg/dL (ref 6–20)
CO2: 30 mmol/L (ref 22–32)
Calcium: 9.2 mg/dL (ref 8.9–10.3)
Chloride: 105 mmol/L (ref 98–111)
Creatinine: 0.79 mg/dL (ref 0.44–1.00)
GFR, Estimated: >60 mL/min (ref >60)
Glucose, Bld: 103 mg/dL — ABNORMAL HIGH (ref 70–99)
Potassium: 3.8 mmol/L (ref 3.5–5.1)
Sodium: 141 mmol/L (ref 135–145)
Total Bilirubin: 0.4 mg/dL (ref 0.3–1.2)
Total Protein: 8.1 g/dL (ref 6.5–8.1)

## 2023-05-25 LAB — FERRITIN: Ferritin: 71 ng/mL (ref 11–307)

## 2023-05-25 LAB — IRON AND IRON BINDING CAPACITY (CC-WL,HP ONLY)
Iron: 33 ug/dL (ref 28–170)
Saturation Ratios: 9 % — ABNORMAL LOW (ref 10.4–31.8)
TIBC: 379 ug/dL (ref 250–450)
UIBC: 346 ug/dL

## 2023-05-31 DIAGNOSIS — F331 Major depressive disorder, recurrent, moderate: Secondary | ICD-10-CM | POA: Diagnosis not present

## 2023-05-31 DIAGNOSIS — I10 Essential (primary) hypertension: Secondary | ICD-10-CM | POA: Diagnosis not present

## 2023-05-31 DIAGNOSIS — Z79899 Other long term (current) drug therapy: Secondary | ICD-10-CM | POA: Diagnosis not present

## 2023-05-31 DIAGNOSIS — Z6841 Body Mass Index (BMI) 40.0 and over, adult: Secondary | ICD-10-CM | POA: Diagnosis not present

## 2023-05-31 DIAGNOSIS — E538 Deficiency of other specified B group vitamins: Secondary | ICD-10-CM | POA: Diagnosis not present

## 2023-05-31 DIAGNOSIS — E559 Vitamin D deficiency, unspecified: Secondary | ICD-10-CM | POA: Diagnosis not present

## 2023-05-31 DIAGNOSIS — F419 Anxiety disorder, unspecified: Secondary | ICD-10-CM | POA: Diagnosis not present

## 2023-05-31 DIAGNOSIS — M5137 Other intervertebral disc degeneration, lumbosacral region: Secondary | ICD-10-CM | POA: Diagnosis not present

## 2023-05-31 DIAGNOSIS — G894 Chronic pain syndrome: Secondary | ICD-10-CM | POA: Diagnosis not present

## 2023-05-31 DIAGNOSIS — E119 Type 2 diabetes mellitus without complications: Secondary | ICD-10-CM | POA: Diagnosis not present

## 2023-05-31 DIAGNOSIS — K219 Gastro-esophageal reflux disease without esophagitis: Secondary | ICD-10-CM | POA: Diagnosis not present

## 2023-06-01 ENCOUNTER — Ambulatory Visit: Payer: Medicare HMO | Admitting: Hematology and Oncology

## 2023-06-01 ENCOUNTER — Other Ambulatory Visit: Payer: Self-pay | Admitting: Hematology and Oncology

## 2023-06-01 ENCOUNTER — Inpatient Hospital Stay: Payer: Medicare HMO | Admitting: Hematology and Oncology

## 2023-06-01 DIAGNOSIS — D500 Iron deficiency anemia secondary to blood loss (chronic): Secondary | ICD-10-CM

## 2023-06-01 NOTE — Progress Notes (Deleted)
[facility] [phone]   [phone]  PROGRESS NOTE  Patient Care Team: Pcp, No as PCP - General  Hematological/Oncological History # Iron Deficiency Anemia 2/2 to GYN Bleeding 02/13/2020: WBC 5.6, hgb 9.7, MCV 87, Plt 295 01/03/2021: WBC 5.2, Hgb 10.7, MCV 87, Plt 276 01/17/2021: establish care with Dr. Leonides Schanz  02/04/2021-03/04/2021: IV venofer 200 mg x 5 doses. Lost to Follow up.  01/15/2023-02/12/2023: IV venofer 200 mg x 5 doses  Interval History:  [name] 57 y.o. female with medical history significant for iron deficiency anemia 2/2 to GYN bleeding who presents for a follow up visit. The patient's last visit was on 01/05/2023. In the interim she received 5 doses of IV venofer.   On exam today [name] reports she tolerated her Venofer infusions well in April and May.  She notes that she does not feel any different as result of the infusions.  She tolerated them well without any side effects as result of the infusion.  She reports she continues to be quite tired.  She notes that the sores on her legs are definitely healing better with the improved iron levels, but she just does not feel any better.  She notes she is not having any lightheadedness, dizziness, or shortness of breath.  She notes that she is still craving and eating ice but not as much as before.  She is not having any overt signs of bleeding anywhere and is not having any menstrual bleeding.  She is doing her best to try to eat iron rich foods and is doing so about 2-3 times per week.  She otherwise denies any nausea, vomiting, diarrhea, fevers, chills, sweats.  A full 10 point ROS is otherwise negative.  MEDICAL HISTORY:  Past Medical History:  Diagnosis Date   CHF (congestive heart failure) (HCC)    DVT (deep venous thrombosis) (HCC)    Headache(784.0)    History of chicken pox    Hypertension    Morbid obesity (HCC)    Osteoarthritis     SURGICAL HISTORY: Past Surgical  History:  Procedure Laterality Date   TUBAL LIGATION  1985    SOCIAL HISTORY: Social History   Socioeconomic History   Marital status: Divorced    Spouse name: Not on file   Number of children: Not on file   Years of education: Not on file   Highest education level: Not on file  Occupational History   Occupation: Warehouse    Employer: [employer]  Tobacco Use   Smoking status: Never   Smokeless tobacco: Never  Substance and Sexual Activity   Alcohol use: No    Alcohol/week: 0.0 standard drinks of alcohol   Drug use: No   Sexual activity: Not on file  Other Topics Concern   Not on file  Social History Narrative   Currently unemployed    Has one daughter in [place]    Social Determinants of Health   Financial Resource Strain: Not on file  Food Insecurity: Not on file  Transportation Needs: Not on file  Physical Activity: Not on file  Stress: Not on file  Social Connections: Not on file  Intimate Partner Violence: Not on file    FAMILY HISTORY: Family History  Problem Relation Age of Onset   Arthritis Mother    Hypertension Mother    Prostate cancer Maternal Grandfather    Breast cancer Maternal Grandmother     ALLERGIES:  has No Known Allergies.  MEDICATIONS:  Current Outpatient Medications  Medication Sig Dispense Refill   acetaminophen (TYLENOL) 500 MG tablet Take 500 mg by mouth every 6 (six) hours as needed for mild pain.     albuterol (VENTOLIN HFA) 108 (90 Base) MCG/ACT inhaler albuterol sulfate HFA 90 mcg/actuation aerosol inhaler (Patient not taking: Reported on 08/30/2022)     ALPRAZolam (XANAX) 0.25 MG tablet Take 0.25 mg by mouth 3 (three) times daily as needed.     amLODIPine-Valsartan-HCTZ 10-320-25 MG TABS TAKE 1 TABLET BY MOUTH EVERY DAY 90 tablet 3   atorvastatin (LIPITOR) 20 MG tablet TAKE 1 TABLET BY MOUTH EVERY DAY 90 tablet 3   buprenorphine (BUTRANS) 20 MCG/HR PTWK 1 patch once a week.     citalopram (CELEXA) 20 MG tablet TAKE 1  TABLET BY MOUTH EVERY DAY 90 tablet 1   ergocalciferol (VITAMIN D2) 1.25 MG (50000 UT) capsule ergocalciferol (vitamin D2) 1,250 mcg (50,000 unit) capsule  TAKE 1 (ONE) CAPSULE BY MOUTH WEEKLY     ferrous sulfate 324 MG TBEC ferrous sulfate 324 mg (65 mg iron) tablet,delayed release  TAKE 1 TABLET BY MOUTH TWICE A DAY     furosemide (LASIX) 40 MG tablet TAKE 1.5 TABLETS BY MOUTH DAILY. 135 tablet 0   lisinopril (ZESTRIL) 10 MG tablet lisinopril 10 mg tablet  TAKE 1 TABLET BY MOUTH EVERY DAY     LYRICA 75 MG capsule Take 75 mg by mouth 2 (two) times daily.  0   Magnesium Oxide 500 MG TABS magnesium oxide 500 mg tablet  TAKE 1 TABLET BY MOUTH EVERYDAY AT BEDTIME     metoprolol tartrate (LOPRESSOR) 100 MG tablet Take two tablets ( 200 mg) in the morning and one tablet ( 100 mg) in the evening. (Patient not taking: Reported on 08/30/2022) 90 tablet 0   oxyCODONE (ROXICODONE) 15 MG immediate release tablet Take 15 mg by mouth every 8 (eight) hours as needed for pain.     phentermine 37.5 MG capsule Take 37.5 mg by mouth daily.     Semaglutide, 1 MG/DOSE, (OZEMPIC, 1 MG/DOSE,) 4 MG/3ML SOPN Ozempic 1 mg/dose (4 mg/3 mL) subcutaneous pen injector     senna-docusate (SENOKOT-S) 8.6-50 MG tablet Senna Plus 8.6 mg-50 mg tablet  2 (TWO) TABLET AS NEEDED     spironolactone (ALDACTONE) 25 MG tablet TAKE 1 TABLET BY MOUTH EVERY DAY 90 tablet 3   topiramate (TOPAMAX) 50 MG tablet topiramate 50 mg tablet     triamcinolone ointment (KENALOG) 0.5 % Apply 1 Application topically 2 (two) times daily. 30 g 1   No current facility-administered medications for this visit.    REVIEW OF SYSTEMS:   Constitutional: ( - ) fevers, ( - )  chills , ( - ) night sweats Eyes: ( - ) blurriness of vision, ( - ) double vision, ( - ) watery eyes Ears, nose, mouth, throat, and face: ( - ) mucositis, ( - ) sore throat Respiratory: ( - ) cough, ( - ) dyspnea, ( - ) wheezes Cardiovascular: ( - ) palpitation, ( - ) chest  discomfort, ( - ) lower extremity swelling Gastrointestinal:  ( - ) nausea, ( - ) heartburn, ( - ) change in bowel habits Skin: ( - ) abnormal skin rashes Lymphatics: ( - ) new lymphadenopathy, ( - ) easy bruising Neurological: ( - ) numbness, ( - ) tingling, ( - ) new weaknesses Behavioral/Psych: ( - ) mood change, ( - ) new changes  All other systems were reviewed with the  patient and are negative.  PHYSICAL EXAMINATION:  There were no vitals filed for this visit.   There were no vitals filed for this visit.    GENERAL: Well-appearing obese African-American female, alert, no distress and comfortable SKIN: skin color, texture, turgor are normal, no rashes or significant lesions EYES: conjunctiva are pink and non-injected, sclera clear LUNGS: clear to auscultation and percussion with normal breathing effort HEART: regular rate & rhythm and no murmurs and no lower extremity edema Musculoskeletal: no cyanosis of digits and no clubbing  PSYCH: alert & oriented x 3, fluent speech NEURO: no focal motor/sensory deficits  LABORATORY DATA:  I have reviewed the data as listed    Latest Ref Rng & Units 05/25/2023    8:56 AM 03/09/2023    8:50 AM 01/05/2023    9:14 AM  CBC  WBC 4.0 - 10.5 K/uL 5.7  6.1  8.6   Hemoglobin 12.0 - 15.0 g/dL 21.3  08.6  8.7   Hematocrit 36.0 - 46.0 % 33.0  33.1  27.5   Platelets 150 - 400 K/uL 274  287  503        Latest Ref Rng & Units 05/25/2023    8:56 AM 03/09/2023    8:50 AM 01/05/2023    9:14 AM  CMP  Glucose 70 - 99 mg/dL 578  469  629   BUN 6 - 20 mg/dL 14  12  11    Creatinine 0.44 - 1.00 mg/dL 5.28  4.13  2.44   Sodium 135 - 145 mmol/L 141  139  138   Potassium 3.5 - 5.1 mmol/L 3.8  3.9  3.8   Chloride 98 - 111 mmol/L 105  104  103   CO2 22 - 32 mmol/L 30  30  28    Calcium 8.9 - 10.3 mg/dL 9.2  9.3  9.5   Total Protein 6.5 - 8.1 g/dL 8.1  7.6  8.5   Total Bilirubin 0.3 - 1.2 mg/dL 0.4  0.3  0.3   Alkaline Phos 38 - 126 U/L 117  116  121    AST 15 - 41 U/L 19  17  13    ALT 0 - 44 U/L 11  9  9      RADIOGRAPHIC STUDIES: No results found.  ASSESSMENT & PLAN [name] 57 y.o. female with medical history significant for iron deficiency anemia 2/2 to GYN bleeding who presents for a follow up visit.  # Iron Deficiency Anemia 2/2 to GYN Bleeding -- Findings are most consistent with iron deficiency anemia secondary to GYN bleeding,  Patient has started menopause this year and is no longer having menstrual bleeding. -- Patient was previously on iron pills but discontinued them due to symptoms. -- Recommend proceeding with IV iron if she is persistently iron deficient.  -- patient not having active menstrual cycles, concern for a possible GI source of bleeding. Will make referral to GI.  --Labs today show white blood cell count 6.1, Hgb 10.4, MCV 84, Plt 287 -- Return to clinic pending results of iron studies. Placeholder visit in 3 months time.   No orders of the defined types were placed in this encounter.   All questions were answered. The patient knows to call the clinic with any problems, questions or concerns.  A total of more than 30 minutes were spent on this encounter with face-to-face time and non-face-to-face time, including preparing to see the patient, ordering tests and/or medications, counseling the patient and coordination of care as  outlined above.   Ulysees Barns, MD Department of Hematology/Oncology [facility] Phone: [phone] Pager: [phone] Email: [email or web address]  06/01/2023 7:35 AM

## 2023-06-04 ENCOUNTER — Encounter (HOSPITAL_BASED_OUTPATIENT_CLINIC_OR_DEPARTMENT_OTHER): Payer: Medicare HMO | Attending: Internal Medicine | Admitting: Internal Medicine

## 2023-06-04 DIAGNOSIS — Z79899 Other long term (current) drug therapy: Secondary | ICD-10-CM | POA: Diagnosis not present

## 2023-06-11 ENCOUNTER — Ambulatory Visit (HOSPITAL_BASED_OUTPATIENT_CLINIC_OR_DEPARTMENT_OTHER): Payer: Medicare HMO | Admitting: Internal Medicine

## 2023-06-26 DIAGNOSIS — F331 Major depressive disorder, recurrent, moderate: Secondary | ICD-10-CM | POA: Diagnosis not present

## 2023-06-26 DIAGNOSIS — I10 Essential (primary) hypertension: Secondary | ICD-10-CM | POA: Diagnosis not present

## 2023-06-26 DIAGNOSIS — Z79899 Other long term (current) drug therapy: Secondary | ICD-10-CM | POA: Diagnosis not present

## 2023-06-26 DIAGNOSIS — E538 Deficiency of other specified B group vitamins: Secondary | ICD-10-CM | POA: Diagnosis not present

## 2023-06-26 DIAGNOSIS — K219 Gastro-esophageal reflux disease without esophagitis: Secondary | ICD-10-CM | POA: Diagnosis not present

## 2023-06-26 DIAGNOSIS — E119 Type 2 diabetes mellitus without complications: Secondary | ICD-10-CM | POA: Diagnosis not present

## 2023-06-26 DIAGNOSIS — Z6841 Body Mass Index (BMI) 40.0 and over, adult: Secondary | ICD-10-CM | POA: Diagnosis not present

## 2023-06-26 DIAGNOSIS — E559 Vitamin D deficiency, unspecified: Secondary | ICD-10-CM | POA: Diagnosis not present

## 2023-06-26 DIAGNOSIS — F419 Anxiety disorder, unspecified: Secondary | ICD-10-CM | POA: Diagnosis not present

## 2023-06-26 DIAGNOSIS — M5137 Other intervertebral disc degeneration, lumbosacral region with discogenic back pain only: Secondary | ICD-10-CM | POA: Diagnosis not present

## 2023-06-28 DIAGNOSIS — Z79899 Other long term (current) drug therapy: Secondary | ICD-10-CM | POA: Diagnosis not present

## 2023-07-02 ENCOUNTER — Encounter (HOSPITAL_BASED_OUTPATIENT_CLINIC_OR_DEPARTMENT_OTHER): Payer: Medicare HMO | Attending: General Surgery | Admitting: General Surgery

## 2023-07-02 DIAGNOSIS — I5032 Chronic diastolic (congestive) heart failure: Secondary | ICD-10-CM | POA: Insufficient documentation

## 2023-07-02 DIAGNOSIS — M1990 Unspecified osteoarthritis, unspecified site: Secondary | ICD-10-CM | POA: Diagnosis not present

## 2023-07-02 DIAGNOSIS — L97812 Non-pressure chronic ulcer of other part of right lower leg with fat layer exposed: Secondary | ICD-10-CM | POA: Diagnosis not present

## 2023-07-02 DIAGNOSIS — I110 Hypertensive heart disease with heart failure: Secondary | ICD-10-CM | POA: Diagnosis not present

## 2023-07-02 DIAGNOSIS — Z86718 Personal history of other venous thrombosis and embolism: Secondary | ICD-10-CM | POA: Insufficient documentation

## 2023-07-02 DIAGNOSIS — L97822 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osed: Secondary | ICD-10-CM | POA: Diagnosis not present

## 2023-07-02 DIAGNOSIS — I890 Lymphedema, not elsewhere classified: Secondary | ICD-10-CM | POA: Insufficient documentation

## 2023-07-02 NOTE — Progress Notes (Signed)
[name] Joan ([id number]) [id number] Page 1 of 15 Visit Report for 07/02/2023 Chief Complaint Document Details Patient Name: Date of Service: [name], [place] [name]. 07/02/2023 8:00 [name] Medical Record Number: [id number] Patient Account Number: [id number] Date of Birth/Sex: Treating RN: 1966/09/08 (57 y.o. F) Primary Care Provider: PCP, [name]: Referring Provider: Treating Provider/Extender: Sherryl Manges in Treatment: 0 Information Obtained from: Patient Chief Complaint Patient presents for treatment of open ulcers due to lymphedema Electronic Signature(s) Signed: 07/02/2023 9:35:25 AM By: Duanne Guess MD FACS Entered By: Duanne Guess on 07/02/2023 09:35:24 -------------------------------------------------------------------------------- Debridement Details Patient Name: Date of Service: Joan Officer, [place] [name]. 07/02/2023 8:00 [name] Medical Record Number: [id number] Patient Account Number: [id number] Date of Birth/Sex: Treating RN: August 29, 1966 (57 y.o. [name] Primary Care Provider: PCP, [name]: Referring Provider: Treating Provider/Extender: Sherryl Manges in Treatment: 0 Debridement Performed for Assessment: Wound #1 Right,Anterior Lower Leg Performed By: Physician Duanne Guess, MD The following information was scribed by: Karie Schwalbe The information was scribed for: Duanne Guess Debridement Type: Debridement Level of Consciousness (Pre-procedure): Awake and Alert Pre-procedure Verification/Time Out Yes - 09:20 Taken: Start Time: 09:20 Pain Control: Lidocaine 4% T opical Solution Percent of Wound Bed Debrided: 100% T Area Debrided (cm): otal 4.71 Tissue and other material debrided: Non-Viable, Slough, Slough Level: Non-Viable Tissue Debridement Description: Selective/Open Wound Instrument: Curette Bleeding: Minimum Hemostasis Achieved: Pressure End Time: 09:26 Procedural Pain: 0 Post  Procedural Pain: 0 Response to Treatment: Procedure was tolerated well Level of Consciousness (Post- Awake and Alert procedure): Post Debridement Measurements of Total Wound Length: (cm) 3 Width: (cm) 2 Depth: (cm) 0.1 Volume: (cm) 0.471 [name], [name] ([id number]) [id number] Page 2 of 15 Character of Wound/Ulcer Post Debridement: Improved Post Procedure Diagnosis Same as Pre-procedure Electronic Signature(s) Signed: 07/02/2023 10:35:48 AM By: Duanne Guess MD FACS Signed: 07/02/2023 5:38:09 PM By: Karie Schwalbe RN Entered By: Karie Schwalbe on 07/02/2023 09:28:47 -------------------------------------------------------------------------------- Debridement Details Patient Name: Date of Service: Joan Officer, [place] [name]. 07/02/2023 8:00 [name] Medical Record Number: [id number] Patient Account Number: [id number] Date of Birth/Sex: Treating RN: 1966/06/30 (56 y.o. [name] Primary Care Provider: PCP, [name]: Referring Provider: Treating Provider/Extender: Sherryl Manges in Treatment: 0 Debridement Performed for Assessment: Wound #2 Right,Lateral Lower Leg Performed By: Physician Duanne Guess, MD The following information was scribed by: Karie Schwalbe The information was scribed for: Duanne Guess Debridement Type: Debridement Level of Consciousness (Pre-procedure): Awake and Alert Pre-procedure Verification/Time Out Yes - 09:20 Taken: Start Time: 09:20 Pain Control: Lidocaine 4% T opical Solution Percent of Wound Bed Debrided: 100% T Area Debrided (cm): otal 5.1 Tissue and other material debrided: Non-Viable, Slough, Slough Level: Non-Viable Tissue Debridement Description: Selective/Open Wound Instrument: Curette Bleeding: Minimum Hemostasis Achieved: Pressure End Time: 09:26 Procedural Pain: 0 Post Procedural Pain: 0 Response to Treatment: Procedure was tolerated well Level of Consciousness (Post- Awake and  Alert procedure): Post Debridement Measurements of Total Wound Length: (cm) 6.5 Width: (cm) 1 Depth: (cm) 0.1 Volume: (cm) 0.511 Character of Wound/Ulcer Post Debridement: Improved Post Procedure Diagnosis Same as Pre-procedure Electronic Signature(s) Signed: 07/02/2023 10:35:48 AM By: Duanne Guess MD FACS Signed: 07/02/2023 5:38:09 PM By: Karie Schwalbe RN Entered By: Karie Schwalbe on 07/02/2023 09:29:26 [name] ([id number]) [id number] Page 3 of 15 -------------------------------------------------------------------------------- Debridement Details Patient Name: Date of Service: [name], [place] [name]. 07/02/2023 8:00 [name] Medical Record Number: [id number] Patient Account Number: [id number] Date of  Birth/Sex: Treating RN: 04/07/1966 (57 y.o. [name] Primary Care Provider: PCP, [name]: Referring Provider: Treating Provider/Extender: Sherryl Manges in Treatment: 0 Debridement Performed for Assessment: Wound #3 Left,Proximal,Medial Lower Leg Performed By: Physician Duanne Guess, MD The following information was scribed by: Karie Schwalbe The information was scribed for: Duanne Guess Debridement Type: Debridement Level of Consciousness (Pre-procedure): Awake and Alert Pre-procedure Verification/Time Out Yes - 09:20 Taken: Start Time: 09:20 Pain Control: Lidocaine 4% T opical Solution Percent of Wound Bed Debrided: 100% T Area Debrided (cm): otal 3.92 Tissue and other material debrided: Non-Viable, Slough, Slough Level: Non-Viable Tissue Debridement Description: Selective/Open Wound Instrument: Curette Bleeding: Minimum Hemostasis Achieved: Pressure End Time: 09:26 Procedural Pain: 0 Post Procedural Pain: 0 Response to Treatment: Procedure was tolerated well Level of Consciousness (Post- Awake and Alert procedure): Post Debridement Measurements of Total Wound Length: (cm) 2 Width: (cm) 2.5 Depth:  (cm) 0.3 Volume: (cm) 1.178 Character of Wound/Ulcer Post Debridement: Improved Post Procedure Diagnosis Same as Pre-procedure Electronic Signature(s) Signed: 07/02/2023 10:35:48 AM By: Duanne Guess MD FACS Signed: 07/02/2023 5:38:09 PM By: Karie Schwalbe RN Entered By: Karie Schwalbe on 07/02/2023 09:30:02 -------------------------------------------------------------------------------- Debridement Details Patient Name: Date of Service: Joan Officer, MA [name]. 07/02/2023 8:00 [name] Medical Record Number: [id number] Patient Account Number: [id number] Date of Birth/Sex: Treating RN: 1966-05-25 (57 y.o. [name] Primary Care Provider: PCP, [name]: Referring Provider: Treating Provider/Extender: Sherryl Manges in Treatment: 0 Debridement Performed for Assessment: Wound #4 Left,Distal,Medial Lower Leg Performed By: Physician Duanne Guess, MD The following information was scribed by: Karie Schwalbe The information was scribed for: Duanne Guess Debridement Type: Debridement Level of Consciousness (Pre-procedure): Awake and Alert Pre-procedure Verification/Time Out Yes - 09:20 Taken: Start Time: 09:20 Pain Control: Lidocaine 4% Topical Solution Percent of Wound Bed Debrided: 100% IYLA, BALZARINI Joan ([id number]) [phone].pdf Page 4 of 15 T Area Debrided (cm): otal 0.2 Tissue and other material debrided: Non-Viable, Slough, Slough Level: Non-Viable Tissue Debridement Description: Selective/Open Wound Instrument: Curette Bleeding: Minimum Hemostasis Achieved: Pressure End Time: 09:26 Procedural Pain: 0 Post Procedural Pain: 0 Response to Treatment: Procedure was tolerated well Level of Consciousness (Post- Awake and Alert procedure): Post Debridement Measurements of Total Wound Length: (cm) 0.5 Width: (cm) 0.5 Depth: (cm) 0.1 Volume: (cm) 0.02 Character of Wound/Ulcer Post Debridement: Improved Post Procedure  Diagnosis Same as Pre-procedure Electronic Signature(s) Signed: 07/02/2023 10:35:48 AM By: Duanne Guess MD FACS Signed: 07/02/2023 5:38:09 PM By: Karie Schwalbe RN Entered By: Karie Schwalbe on 07/02/2023 09:30:38 -------------------------------------------------------------------------------- Debridement Details Patient Name: Date of Service: Joan Officer, MA [name]. 07/02/2023 8:00 [name] Medical Record Number: [id number] Patient Account Number: [id number] Date of Birth/Sex: Treating RN: 1966-04-04 (57 y.o. [name] Primary Care Provider: PCP, [name]: Referring Provider: Treating Provider/Extender: Sherryl Manges in Treatment: 0 Debridement Performed for Assessment: Wound #5 Left,Anterior Lower Leg Performed By: Physician Duanne Guess, MD The following information was scribed by: Karie Schwalbe The information was scribed for: Duanne Guess Debridement Type: Debridement Level of Consciousness (Pre-procedure): Awake and Alert Pre-procedure Verification/Time Out Yes - 09:20 Taken: Start Time: 09:20 Pain Control: Lidocaine 4% T opical Solution Percent of Wound Bed Debrided: 100% T Area Debrided (cm): otal 7.85 Tissue and other material debrided: Non-Viable, Slough, Slough Level: Non-Viable Tissue Debridement Description: Selective/Open Wound Instrument: Curette Bleeding: Minimum Hemostasis Achieved: Pressure End Time: 09:26 Procedural Pain: 0 Post Procedural Pain: 0 Response to Treatment: Procedure was tolerated well Level of Consciousness (Post-  Awake and Alert procedure): Post Debridement Measurements of Total Wound Length: (cm) 5 Width: (cm) 2 Depth: (cm) 0.1 Volume: (cm) 0.785 [name], [name] ([id number]) [phone].pdf Page 5 of 15 Character of Wound/Ulcer Post Debridement: Improved Post Procedure Diagnosis Same as Pre-procedure Electronic Signature(s) Signed: 07/02/2023 10:35:48 AM By: Duanne Guess MD  FACS Signed: 07/02/2023 5:38:09 PM By: Karie Schwalbe RN Entered By: Karie Schwalbe on 07/02/2023 09:31:24 -------------------------------------------------------------------------------- Debridement Details Patient Name: Date of Service: Joan Officer, [place] [name]. 07/02/2023 8:00 [name] Medical Record Number: [id number] Patient Account Number: [id number] Date of Birth/Sex: Treating RN: [birth year] (57 y.o. [name] Primary Care Provider: PCP, [name]: Referring Provider: Treating Provider/Extender: Sherryl Manges in Treatment: 0 Debridement Performed for Assessment: Wound #6 Left,Lateral Lower Leg Performed By: Physician Duanne Guess, MD The following information was scribed by: Karie Schwalbe The information was scribed for: Duanne Guess Debridement Type: Debridement Level of Consciousness (Pre-procedure): Awake and Alert Pre-procedure Verification/Time Out Yes - 09:20 Taken: Start Time: 09:20 Pain Control: Lidocaine 4% T opical Solution Percent of Wound Bed Debrided: 100% T Area Debrided (cm): otal 3.61 Tissue and other material debrided: Non-Viable, Slough, Slough Level: Non-Viable Tissue Debridement Description: Selective/Open Wound Instrument: Curette Bleeding: Minimum Hemostasis Achieved: Pressure End Time: 09:26 Procedural Pain: 0 Post Procedural Pain: 0 Response to Treatment: Procedure was tolerated well Level of Consciousness (Post- Awake and Alert procedure): Post Debridement Measurements of Total Wound Length: (cm) 2.3 Width: (cm) 2 Depth: (cm) 0.3 Volume: (cm) 1.084 Character of Wound/Ulcer Post Debridement: Improved Post Procedure Diagnosis Same as Pre-procedure Electronic Signature(s) Signed: 07/02/2023 10:35:48 AM By: Duanne Guess MD FACS Signed: 07/02/2023 5:38:09 PM By: Karie Schwalbe RN Entered By: Karie Schwalbe on 07/02/2023 09:32:05 [name] ([id number]) [id number] Page 6 of  15 -------------------------------------------------------------------------------- HPI Details Patient Name: Date of Service: [name], [place] [name]. 07/02/2023 8:00 [name] Medical Record Number: [id number] Patient Account Number: [id number] Date of Birth/Sex: Treating RN: Mar 15, 1966 (57 y.o. F) Primary Care Provider: PCP, [name]: Referring Provider: Treating Provider/Extender: Sherryl Manges in Treatment: 0 History of Present Illness HPI Description: ADMISSION 07/02/2023 ***ABIs non-compressible bilaterally; triphasic Doppler signals*** This is Joan 57 year old morbidly obese patient who is not Joan diabetic. She does have hypertension and CHF. Although she does not carry this diagnosis in her electronic medical record, she clearly has lymphedema. She has multiple wounds on her bilateral lower extremities, 1 of which has been present for 3 years. She has been treating her wounds with peroxide and bacitracin. Her provider has given her various courses of antibiotics, but the wounds have not closed. She was told not to wear compression stockings due to her history of DVT but this was well over Joan year ago. She has never worn lymphedema pumps. , Electronic Signature(s) Signed: 07/02/2023 9:38:58 AM By: Duanne Guess MD FACS Entered By: Duanne Guess on 07/02/2023 09:38:58 -------------------------------------------------------------------------------- Physical Exam Details Patient Name: Date of Service: Joan Officer, MA [name]. 07/02/2023 8:00 [name] Medical Record Number: [id number] Patient Account Number: [id number] Date of Birth/Sex: Treating RN: 1966-02-22 (57 y.o. F) Primary Care Provider: PCP, [name]: Referring Provider: Treating Provider/Extender: Sherryl Manges in Treatment: 0 Constitutional Hypertensive, asymptomatic. Slightly tachycardic. . . No acute distress. Respiratory Normal work of breathing on room air. Cardiovascular . Stage II  lymphedema. Notes 07/02/2023: On her left lower leg, lateral aspect, there is Joan circular ulcer that penetrates into the fat layer. It has slough on the surface. There  is some depth to the wound, likely owing to the patient's swelling. She has Joan similar looking, but slightly smaller wound, on the left medial lower leg, just below the level of the tibial tuberosity. On her right lateral lower leg, she has Joan pair of ulcers that almost look like Joan snake bite site, but they have been present for several years. She says these close and open spontaneously. She has Joan number of other smaller superficial ulcers on both legs. Electronic Signature(s) Signed: 07/02/2023 9:41:17 AM By: Duanne Guess MD FACS Entered By: Duanne Guess on 07/02/2023 09:41:16 -------------------------------------------------------------------------------- Physician Orders Details Patient Name: Date of Service: Joan Officer, MA [name]. 07/02/2023 8:00 [name] Medical Record Number: [id number] Patient Account Number: [id number] Date of Birth/Sex: Treating RN: [birth year] (57 y.o. [name] Primary Care Provider: PCP, [name]: Referring Provider: Treating Provider/Extender: Camrin, Lapre ([id number]) [id number] Page 7 of 15 Weeks in Treatment: 0 Verbal / Phone Orders: No Diagnosis Coding ICD-10 Coding Code Description [id number] Non-pressure chronic ulcer of other part of right lower leg with fat layer exposed L97.822 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osed I89.0 Lymphedema, not elsewhere classified I50.32 Chronic diastolic (congestive) heart failure E66.01 Morbid (severe) obesity due to excess calories Follow-up Appointments ppointment in 1 week. - Dr. Lady Gary Room 3 Return Joan Bathing/ Shower/ Hygiene May shower with protection but do not get wound dressing(s) wet. Protect dressing(s) with water repellant cover (for example,  large plastic bag) or Joan cast cover and may then take shower. - Keep legs dry until next appointment. Use Cast Protectors if so desired. Other Bathing/Shower/Hygiene Orders/Instructions: - May want to sponge bath if legs cannot be kept dry until next appointment. Edema Control - Lymphedema / SCD / Other Bilateral Lower Extremities Elevate legs to the level of the heart or above for 30 minutes daily and/or when sitting for 3-4 times Joan day throughout the day. Avoid standing for long periods of time. Exercise regularly - As tolerated Moisturize legs daily. Additional Orders / Instructions Follow Nutritious Diet - Try and increase Protein intake. The goal is 70g-100g per day. Examples are Chicken,fish.meat,pork, [place] yogurt ,eggs etc. Wound Treatment Wound #1 - Lower Leg Wound Laterality: Right, Anterior Cleanser: Soap and Water 1 x Per Week/30 Days Discharge Instructions: At Wound center-May shower and wash wound with dial antibacterial soap and water prior to dressing change. Peri-Wound Care: Triamcinolone 15 (g) 1 x Per Week/30 Days Discharge Instructions: Use triamcinolone 15 (g) as directed Peri-Wound Care: Sween Lotion (Moisturizing lotion) 1 x Per Week/30 Days Discharge Instructions: Apply moisturizing lotion as directed Prim Dressing: Hydrofera Blue Ready Transfer Foam, 4x5 (in/in) 1 x Per Week/30 Days ary Discharge Instructions: Apply to wound bed as instructed Secondary Dressing: Woven Gauze Sponge, Non-Sterile 4x4 in 1 x Per Week/30 Days Discharge Instructions: Apply over primary dressing as directed. Compression Wrap: Unnaboot w/Calamine, 4x10 (in/yd) 1 x Per Week/30 Days Discharge Instructions: Apply Unnaboot as directed. Wound #2 - Lower Leg Wound Laterality: Right, Lateral Cleanser: Soap and Water 1 x Per Week/30 Days Discharge Instructions: At Wound center-May shower and wash wound with dial antibacterial soap and water prior to dressing change. Peri-Wound Care:  Triamcinolone 15 (g) 1 x Per Week/30 Days Discharge Instructions: Use triamcinolone 15 (g) as directed Peri-Wound Care: Sween Lotion (Moisturizing lotion) 1 x Per Week/30 Days Discharge Instructions: Apply moisturizing lotion as directed Prim Dressing: Hydrofera Blue Ready Transfer Foam, 4x5 (in/in) 1  x Per Week/30 Days ary Discharge Instructions: Apply to wound bed as instructed Secondary Dressing: Woven Gauze Sponge, Non-Sterile 4x4 in 1 x Per Week/30 Days Discharge Instructions: Apply over primary dressing as directed. Compression Wrap: Unnaboot w/Calamine, 4x10 (in/yd) 1 x Per Week/30 Days Discharge Instructions: Apply Unnaboot as directed. Wound #3 - Lower Leg Wound Laterality: Left, Medial, Proximal Cleanser: Soap and Water 1 x Per Week/30 Days Discharge Instructions: At Wound center-May shower and wash wound with dial antibacterial soap and water prior to dressing change. [name], [name] ([id number]) [id number] Page 8 of 15 Peri-Wound Care: Triamcinolone 15 (g) 1 x Per Week/30 Days Discharge Instructions: Use triamcinolone 15 (g) as directed Peri-Wound Care: Sween Lotion (Moisturizing lotion) 1 x Per Week/30 Days Discharge Instructions: Apply moisturizing lotion as directed Prim Dressing: Hydrofera Blue Ready Transfer Foam, 4x5 (in/in) 1 x Per Week/30 Days ary Discharge Instructions: Apply to wound bed as instructed Secondary Dressing: Woven Gauze Sponge, Non-Sterile 4x4 in 1 x Per Week/30 Days Discharge Instructions: Apply over primary dressing as directed. Compression Wrap: Unnaboot w/Calamine, 4x10 (in/yd) 1 x Per Week/30 Days Discharge Instructions: Apply Unnaboot as directed. Wound #4 - Lower Leg Wound Laterality: Left, Medial, Distal Cleanser: Soap and Water 1 x Per Week/30 Days Discharge Instructions: At Wound center-May shower and wash wound with dial antibacterial soap and water prior to dressing change. Peri-Wound Care: Triamcinolone 15 (g) 1 x  Per Week/30 Days Discharge Instructions: Use triamcinolone 15 (g) as directed Peri-Wound Care: Sween Lotion (Moisturizing lotion) 1 x Per Week/30 Days Discharge Instructions: Apply moisturizing lotion as directed Prim Dressing: Hydrofera Blue Ready Transfer Foam, 4x5 (in/in) 1 x Per Week/30 Days ary Discharge Instructions: Apply to wound bed as instructed Secondary Dressing: Woven Gauze Sponge, Non-Sterile 4x4 in 1 x Per Week/30 Days Discharge Instructions: Apply over primary dressing as directed. Compression Wrap: Unnaboot w/Calamine, 4x10 (in/yd) 1 x Per Week/30 Days Discharge Instructions: Apply Unnaboot as directed. Wound #5 - Lower Leg Wound Laterality: Left, Anterior Cleanser: Soap and Water 1 x Per Week/30 Days Discharge Instructions: At Wound center-May shower and wash wound with dial antibacterial soap and water prior to dressing change. Peri-Wound Care: Triamcinolone 15 (g) 1 x Per Week/30 Days Discharge Instructions: Use triamcinolone 15 (g) as directed Peri-Wound Care: Sween Lotion (Moisturizing lotion) 1 x Per Week/30 Days Discharge Instructions: Apply moisturizing lotion as directed Prim Dressing: Hydrofera Blue Ready Transfer Foam, 4x5 (in/in) 1 x Per Week/30 Days ary Discharge Instructions: Apply to wound bed as instructed Secondary Dressing: Woven Gauze Sponge, Non-Sterile 4x4 in 1 x Per Week/30 Days Discharge Instructions: Apply over primary dressing as directed. Compression Wrap: Unnaboot w/Calamine, 4x10 (in/yd) 1 x Per Week/30 Days Discharge Instructions: Apply Unnaboot as directed. Wound #6 - Lower Leg Wound Laterality: Left, Lateral Cleanser: Soap and Water 1 x Per Week/30 Days Discharge Instructions: At Wound center-May shower and wash wound with dial antibacterial soap and water prior to dressing change. Peri-Wound Care: Triamcinolone 15 (g) 1 x Per Week/30 Days Discharge Instructions: Use triamcinolone 15 (g) as directed Peri-Wound Care: Sween Lotion  (Moisturizing lotion) 1 x Per Week/30 Days Discharge Instructions: Apply moisturizing lotion as directed Prim Dressing: Hydrofera Blue Ready Transfer Foam, 4x5 (in/in) 1 x Per Week/30 Days ary Discharge Instructions: Apply to wound bed as instructed Secondary Dressing: Woven Gauze Sponge, Non-Sterile 4x4 in 1 x Per Week/30 Days Discharge Instructions: Apply over primary dressing as directed. Compression Wrap: Unnaboot w/Calamine, 4x10 (in/yd) 1 x Per Week/30 Days Discharge Instructions: Apply Unnaboot as directed.  Electronic Signature(s) CINDEE, MCLESTER Joan ([id number]) [id number] Page 9 of 15 Signed: 07/02/2023 10:35:48 AM By: Duanne Guess MD FACS Entered By: Duanne Guess on 07/02/2023 09:41:34 -------------------------------------------------------------------------------- Problem List Details Patient Name: Date of Service: Joan Officer, MA [name]. 07/02/2023 8:00 [name] Medical Record Number: [id number] Patient Account Number: [id number] Date of Birth/Sex: Treating RN: [birth year] (57 y.o. F) Primary Care Provider: PCP, [name]: Referring Provider: Treating Provider/Extender: Sherryl Manges in Treatment: 0 Active Problems ICD-10 Encounter Code Description Active Date MDM Diagnosis L97.812 Non-pressure chronic ulcer of other part of right lower leg with fat layer 07/02/2023 No Yes exposed L97.822 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osed10/03/2023 No Yes I89.0 Lymphedema, not elsewhere classified 07/02/2023 No Yes I50.32 Chronic diastolic (congestive) heart failure 07/02/2023 No Yes E66.01 Morbid (severe) obesity due to excess calories 07/02/2023 No Yes Inactive Problems Resolved Problems Electronic Signature(s) Signed: 07/02/2023 9:33:27 AM By: Duanne Guess MD FACS Previous Signature: 07/02/2023 8:09:36 AM Version By: Duanne Guess MD FACS Entered By: Duanne Guess on 07/02/2023  09:33:27 -------------------------------------------------------------------------------- Progress Note Details Patient Name: Date of Service: Joan Officer, MA [name]. 07/02/2023 8:00 [name] Medical Record Number: [id number] Patient Account Number: [id number] Date of Birth/Sex: Treating RN: [birth year] (57 y.o. F) Primary Care Provider: PCP, [name]: Referring Provider: Treating Provider/Extender: Sherryl Manges in Treatment: 0 Subjective [name], [name] ([id number]) [id number] Page 10 of 15 Chief Complaint Information obtained from Patient Patient presents for treatment of open ulcers due to lymphedema History of Present Illness (HPI) ADMISSION 07/02/2023 ***ABIs non-compressible bilaterally; triphasic Doppler signals*** This is Joan 57 year old morbidly obese patient who is not Joan diabetic. She does have hypertension and CHF. Although she does not carry this diagnosis in her electronic medical record, she clearly has lymphedema. She has multiple wounds on her bilateral lower extremities, 1 of which has been present for 3 years. She has been treating her wounds with peroxide and bacitracin. Her provider has given her various courses of antibiotics, but the wounds have not closed. She was told not to wear compression stockings due to her history of DVT but this was well over Joan year ago. She has never worn lymphedema pumps. , Patient History Information obtained from Patient. Allergies No Known Allergies Family History Unknown History. Social History Never smoker, Marital Status - Divorced, Alcohol Use - Rarely, Drug Use - No History, Caffeine Use - Daily - Tea,Coffee ,Soda. Medical History Hematologic/Lymphatic Patient has history of Anemia Respiratory Patient has history of Asthma Cardiovascular Patient has history of Congestive Heart Failure, Deep Vein Thrombosis, Hypertension Musculoskeletal Patient has history of  Osteoarthritis Hospitalization/Surgery History - Tubal Ligation. Medical Joan Surgical History Notes nd Cardiovascular Morbidly Obese Review of Systems (ROS) Constitutional Symptoms (General Health) Denies complaints or symptoms of Fatigue, Fever, Chills, Marked Weight Change. Ear/Nose/Mouth/Throat Denies complaints or symptoms of Chronic sinus problems or rhinitis. Objective Constitutional Hypertensive, asymptomatic. Slightly tachycardic. No acute distress. Vitals Time Taken: 8:12 AM, Height: 71 in, Weight: 352 lbs, BMI: 49.1, Temperature: 98.6 F, Pulse: 108 bpm, Respiratory Rate: 18 breaths/min, Blood Pressure: 165/110 mmHg. Respiratory Normal work of breathing on room air. Cardiovascular Stage II lymphedema. General Notes: 07/02/2023: On her left lower leg, lateral aspect, there is Joan circular ulcer that penetrates into the fat layer. It has slough on the surface. There is some depth to the wound, likely owing to the patient's swelling. She has Joan similar looking, but slightly smaller wound, on the left  medial lower leg, just below the level of the tibial tuberosity. On her right lateral lower leg, she has Joan pair of ulcers that almost look like Joan snake bite site, but they have been present for several years. She says these close and open spontaneously. She has Joan number of other smaller superficial ulcers on both legs. Integumentary (Hair, Skin) Wound #1 status is Open. Original cause of wound was Gradually Appeared. The date acquired was: 04/01/2023. The wound is located on the Right,Anterior Lower Leg. The wound measures 3cm length x 2cm width x 0.1cm depth; 4.712cm^2 area and 0.471cm^3 volume. There is Fat Layer (Subcutaneous Tissue) exposed. There is no tunneling or undermining noted. There is Joan medium amount of serosanguineous drainage noted. The wound margin is distinct with the outline attached to the wound base. There is large (67-100%) red, pink granulation within the wound bed. There  is Joan small (1-33%) amount of necrotic tissue within the wound bed including Adherent Slough. The periwound skin appearance had no abnormalities noted for moisture. The periwound skin appearance exhibited: Scarring, Hemosiderin Staining. Periwound temperature was noted as No Abnormality. Wound #2 status is Open. Original cause of wound was Gradually Appeared. The date acquired was: 07/01/2021. The wound is located on the Right,Lateral Lower Leg. The wound measures 6.5cm length x 1cm width x 0.1cm depth; 5.105cm^2 area and 0.511cm^3 volume. There is Fat Layer (Subcutaneous Tissue) exposed. There is no tunneling or undermining noted. There is Joan medium amount of serosanguineous drainage noted. The wound margin is distinct with the [name], [name] ([id number]) [id number] Page 11 of 15 outline attached to the wound base. There is medium (34-66%) red granulation within the wound bed. There is Joan medium (34-66%) amount of necrotic tissue within the wound bed including Adherent Slough. The periwound skin appearance had no abnormalities noted for moisture. The periwound skin appearance exhibited: Scarring, Hemosiderin Staining. Periwound temperature was noted as No Abnormality. Wound #3 status is Open. Original cause of wound was Gradually Appeared. The date acquired was: 04/01/2023. The wound is located on the Left,Proximal,Medial Lower Leg. The wound measures 2cm length x 2.5cm width x 0.3cm depth; 3.927cm^2 area and 1.178cm^3 volume. There is no tunneling or undermining noted. There is Joan medium amount of serosanguineous drainage noted. The wound margin is distinct with the outline attached to the wound base. Wound #4 status is Open. Original cause of wound was Gradually Appeared. The date acquired was: 06/18/2023. The wound is located on the Left,Distal,Medial Lower Leg. The wound measures 0.5cm length x 0.5cm width x 0.1cm depth; 0.196cm^2 area and 0.02cm^3 volume. There is Fat Layer  (Subcutaneous Tissue) exposed. There is no tunneling or undermining noted. There is Joan medium amount of serosanguineous drainage noted. There is small (1-33%) red granulation within the wound bed. There is Joan large (67-100%) amount of necrotic tissue within the wound bed including Eschar and Adherent Slough. The periwound skin appearance had no abnormalities noted for moisture. The periwound skin appearance exhibited: Scarring, Hemosiderin Staining. Periwound temperature was noted as No Abnormality. Wound #5 status is Open. Original cause of wound was Gradually Appeared. The date acquired was: 06/19/2023. The wound is located on the Left,Anterior Lower Leg. The wound measures 5cm length x 2cm width x 0.1cm depth; 7.854cm^2 area and 0.785cm^3 volume. There is Fat Layer (Subcutaneous Tissue) exposed. There is no tunneling or undermining noted. There is Joan medium amount of serosanguineous drainage noted. The wound margin is distinct with the outline attached to the wound base. There is  small (1-33%) red granulation within the wound bed. There is Joan large (67-100%) amount of necrotic tissue within the wound bed including Adherent Slough. The periwound skin appearance had no abnormalities noted for moisture. The periwound skin appearance exhibited: Scarring, Hemosiderin Staining. Periwound temperature was noted as No Abnormality. Wound #6 status is Open. Original cause of wound was Gradually Appeared. The date acquired was: 08/09/2020. The wound is located on the Left,Lateral Lower Leg. The wound measures 2.3cm length x 2cm width x 0.3cm depth; 3.613cm^2 area and 1.084cm^3 volume. There is Fat Layer (Subcutaneous Tissue) exposed. There is no tunneling or undermining noted. There is Joan medium amount of serosanguineous drainage noted. The wound margin is distinct with the outline attached to the wound base. There is medium (34-66%) red granulation within the wound bed. There is Joan medium (34-66%) amount of necrotic  tissue within the wound bed including Adherent Slough. The periwound skin appearance had no abnormalities noted for moisture. The periwound skin appearance exhibited: Scarring, Hemosiderin Staining. Periwound temperature was noted as No Abnormality. Assessment Active Problems ICD-10 Non-pressure chronic ulcer of other part of right lower leg with fat layer exposed 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osed Lymphedema, not elsewhere classified Chronic diastolic (congestive) heart failure Morbid (severe) obesity due to excess calories Procedures Wound #1 Pre-procedure diagnosis of Wound #1 is Joan Lymphedema located on the Right,Anterior Lower Leg . There was Joan Selective/Open Wound Non-Viable Tissue Debridement with Joan total area of 4.71 sq cm performed by Duanne Guess, MD. With the following instrument(s): Curette to remove Non-Viable tissue/material. Material removed includes [facility] after achieving pain control using Lidocaine 4% Topical Solution. No specimens were taken. Joan time out was conducted at 09:20, prior to the start of the procedure. Joan Minimum amount of bleeding was controlled with Pressure. The procedure was tolerated well with Joan pain level of 0 throughout and Joan pain level of 0 following the procedure. Post Debridement Measurements: 3cm length x 2cm width x 0.1cm depth; 0.471cm^3 volume. Character of Wound/Ulcer Post Debridement is improved. Post procedure Diagnosis Wound #1: Same as Pre-Procedure Pre-procedure diagnosis of Wound #1 is Joan Lymphedema located on the Right,Anterior Lower Leg . There was Joan Radio broadcast assistant Compression Therapy Procedure by Karie Schwalbe, RN. Post procedure Diagnosis Wound #1: Same as Pre-Procedure Wound #2 Pre-procedure diagnosis of Wound #2 is Joan Lymphedema located on the Right,Lateral Lower Leg . There was Joan Selective/Open Wound Non-Viable Tissue Debridement with Joan total area of 5.1 sq cm performed by Duanne Guess,  MD. With the following instrument(s): Curette to remove Non-Viable tissue/material. Material removed includes [facility] after achieving pain control using Lidocaine 4% Topical Solution. No specimens were taken. Joan time out was conducted at 09:20, prior to the start of the procedure. Joan Minimum amount of bleeding was controlled with Pressure. The procedure was tolerated well with Joan pain level of 0 throughout and Joan pain level of 0 following the procedure. Post Debridement Measurements: 6.5cm length x 1cm width x 0.1cm depth; 0.511cm^3 volume. Character of Wound/Ulcer Post Debridement is improved. Post procedure Diagnosis Wound #2: Same as Pre-Procedure Pre-procedure diagnosis of Wound #2 is Joan Lymphedema located on the Right,Lateral Lower Leg . There was Joan Radio broadcast assistant Compression Therapy Procedure by Karie Schwalbe, RN. Post procedure Diagnosis Wound #2: Same as Pre-Procedure Wound #3 Pre-procedure diagnosis of Wound #3 is Joan Lymphedema located on the Left,Proximal,Medial Lower Leg . There was Joan Selective/Open Wound Non-Viable Tissue Debridement with Joan total area  of 3.92 sq cm performed by Duanne Guess, MD. With the following instrument(s): Curette to remove Non-Viable tissue/material. Material removed includes [facility] after achieving pain control using Lidocaine 4% Topical Solution. No specimens were taken. Joan time out was conducted at 09:20, prior to the start of the procedure. Joan Minimum amount of bleeding was controlled with Pressure. The procedure was tolerated well with Joan pain level of 0 throughout and Joan pain level of 0 following the procedure. Post Debridement Measurements: 2cm length x 2.5cm width x 0.3cm depth; 1.178cm^3 volume. Character of Wound/Ulcer Post Debridement is improved. [name], [name] ([id number]) [id number] Page 12 of 15 Post procedure Diagnosis Wound #3: Same as Pre-Procedure Pre-procedure diagnosis of Wound #3 is Joan Lymphedema located on the  Left,Proximal,Medial Lower Leg . There was Joan Radio broadcast assistant Compression Therapy Procedure by Karie Schwalbe, RN. Post procedure Diagnosis Wound #3: Same as Pre-Procedure Wound #4 Pre-procedure diagnosis of Wound #4 is Joan Lymphedema located on the Left,Distal,Medial Lower Leg . There was Joan Selective/Open Wound Non-Viable Tissue Debridement with Joan total area of 0.2 sq cm performed by Duanne Guess, MD. With the following instrument(s): Curette to remove Non-Viable tissue/material. Material removed includes [facility] after achieving pain control using Lidocaine 4% Topical Solution. No specimens were taken. Joan time out was conducted at 09:20, prior to the start of the procedure. Joan Minimum amount of bleeding was controlled with Pressure. The procedure was tolerated well with Joan pain level of 0 throughout and Joan pain level of 0 following the procedure. Post Debridement Measurements: 0.5cm length x 0.5cm width x 0.1cm depth; 0.02cm^3 volume. Character of Wound/Ulcer Post Debridement is improved. Post procedure Diagnosis Wound #4: Same as Pre-Procedure Pre-procedure diagnosis of Wound #4 is Joan Lymphedema located on the Left,Distal,Medial Lower Leg . There was Joan Radio broadcast assistant Compression Therapy Procedure by Karie Schwalbe, RN. Post procedure Diagnosis Wound #4: Same as Pre-Procedure Wound #5 Pre-procedure diagnosis of Wound #5 is Joan Lymphedema located on the Left,Anterior Lower Leg . There was Joan Selective/Open Wound Non-Viable Tissue Debridement with Joan total area of 7.85 sq cm performed by Duanne Guess, MD. With the following instrument(s): Curette to remove Non-Viable tissue/material. Material removed includes [facility] after achieving pain control using Lidocaine 4% Topical Solution. No specimens were taken. Joan time out was conducted at 09:20, prior to the start of the procedure. Joan Minimum amount of bleeding was controlled with Pressure. The procedure was tolerated well with Joan pain level of 0 throughout and Joan pain  level of 0 following the procedure. Post Debridement Measurements: 5cm length x 2cm width x 0.1cm depth; 0.785cm^3 volume. Character of Wound/Ulcer Post Debridement is improved. Post procedure Diagnosis Wound #5: Same as Pre-Procedure Pre-procedure diagnosis of Wound #5 is Joan Lymphedema located on the Left,Anterior Lower Leg . There was Joan Radio broadcast assistant Compression Therapy Procedure by Karie Schwalbe, RN. Post procedure Diagnosis Wound #5: Same as Pre-Procedure Wound #6 Pre-procedure diagnosis of Wound #6 is Joan Lymphedema located on the Left,Lateral Lower Leg . There was Joan Selective/Open Wound Non-Viable Tissue Debridement with Joan total area of 3.61 sq cm performed by Duanne Guess, MD. With the following instrument(s): Curette to remove Non-Viable tissue/material. Material removed includes [facility] after achieving pain control using Lidocaine 4% Topical Solution. No specimens were taken. Joan time out was conducted at 09:20, prior to the start of the procedure. Joan Minimum amount of bleeding was controlled with Pressure. The procedure was tolerated well with Joan pain level of 0 throughout and Joan pain level of 0  following the procedure. Post Debridement Measurements: 2.3cm length x 2cm width x 0.3cm depth; 1.084cm^3 volume. Character of Wound/Ulcer Post Debridement is improved. Post procedure Diagnosis Wound #6: Same as Pre-Procedure Pre-procedure diagnosis of Wound #6 is Joan Lymphedema located on the Left,Lateral Lower Leg . There was Joan Radio broadcast assistant Compression Therapy Procedure by Karie Schwalbe, RN. Post procedure Diagnosis Wound #6: Same as Pre-Procedure Plan Follow-up Appointments: Return Appointment in 1 week. - Dr. Lady Gary Room 3 Bathing/ Shower/ Hygiene: May shower with protection but do not get wound dressing(s) wet. Protect dressing(s) with water repellant cover (for example, large plastic bag) or Joan cast cover and may then take shower. - Keep legs dry until next appointment. Use Cast Protectors if so  desired. Other Bathing/Shower/Hygiene Orders/Instructions: - May want to sponge bath if legs cannot be kept dry until next appointment. Edema Control - Lymphedema / SCD / Other: Elevate legs to the level of the heart or above for 30 minutes daily and/or when sitting for 3-4 times Joan day throughout the day. Avoid standing for long periods of time. Exercise regularly - As tolerated Moisturize legs daily. Additional Orders / Instructions: Follow Nutritious Diet - Try and increase Protein intake. The goal is 70g-100g per day. Examples are Chicken,fish.meat,pork, [place] yogurt ,eggs etc. WOUND #1: - Lower Leg Wound Laterality: Right, Anterior Cleanser: Soap and Water 1 x Per Week/30 Days Discharge Instructions: At Wound center-May shower and wash wound with dial antibacterial soap and water prior to dressing change. Peri-Wound Care: Triamcinolone 15 (g) 1 x Per Week/30 Days Discharge Instructions: Use triamcinolone 15 (g) as directed Peri-Wound Care: Sween Lotion (Moisturizing lotion) 1 x Per Week/30 Days Discharge Instructions: Apply moisturizing lotion as directed Prim Dressing: Hydrofera Blue Ready Transfer Foam, 4x5 (in/in) 1 x Per Week/30 Days ary Discharge Instructions: Apply to wound bed as instructed Secondary Dressing: Woven Gauze Sponge, Non-Sterile 4x4 in 1 x Per Week/30 Days Discharge Instructions: Apply over primary dressing as directed. Com pression Wrap: Unnaboot w/Calamine, 4x10 (in/yd) 1 x Per Week/30 Days Discharge Instructions: Apply Unnaboot as directed. WOUND #2: - Lower Leg Wound Laterality: Right, Lateral Cleanser: Soap and Water 1 x Per Week/30 Days Discharge Instructions: At Wound center-May shower and wash wound with dial antibacterial soap and water prior to dressing change. [name], [name] ([id number]) [id number] Page 13 of 15 Peri-Wound Care: Triamcinolone 15 (g) 1 x Per Week/30 Days Discharge Instructions: Use triamcinolone 15 (g) as  directed Peri-Wound Care: Sween Lotion (Moisturizing lotion) 1 x Per Week/30 Days Discharge Instructions: Apply moisturizing lotion as directed Prim Dressing: Hydrofera Blue Ready Transfer Foam, 4x5 (in/in) 1 x Per Week/30 Days ary Discharge Instructions: Apply to wound bed as instructed Secondary Dressing: Woven Gauze Sponge, Non-Sterile 4x4 in 1 x Per Week/30 Days Discharge Instructions: Apply over primary dressing as directed. Com pression Wrap: Unnaboot w/Calamine, 4x10 (in/yd) 1 x Per Week/30 Days Discharge Instructions: Apply Unnaboot as directed. WOUND #3: - Lower Leg Wound Laterality: Left, Medial, Proximal Cleanser: Soap and Water 1 x Per Week/30 Days Discharge Instructions: At Wound center-May shower and wash wound with dial antibacterial soap and water prior to dressing change. Peri-Wound Care: Triamcinolone 15 (g) 1 x Per Week/30 Days Discharge Instructions: Use triamcinolone 15 (g) as directed Peri-Wound Care: Sween Lotion (Moisturizing lotion) 1 x Per Week/30 Days Discharge Instructions: Apply moisturizing lotion as directed Prim Dressing: Hydrofera Blue Ready Transfer Foam, 4x5 (in/in) 1 x Per Week/30 Days ary Discharge Instructions: Apply to wound bed as instructed Secondary Dressing: Woven Gauze  Sponge, Non-Sterile 4x4 in 1 x Per Week/30 Days Discharge Instructions: Apply over primary dressing as directed. Com pression Wrap: Unnaboot w/Calamine, 4x10 (in/yd) 1 x Per Week/30 Days Discharge Instructions: Apply Unnaboot as directed. WOUND #4: - Lower Leg Wound Laterality: Left, Medial, Distal Cleanser: Soap and Water 1 x Per Week/30 Days Discharge Instructions: At Wound center-May shower and wash wound with dial antibacterial soap and water prior to dressing change. Peri-Wound Care: Triamcinolone 15 (g) 1 x Per Week/30 Days Discharge Instructions: Use triamcinolone 15 (g) as directed Peri-Wound Care: Sween Lotion (Moisturizing lotion) 1 x Per Week/30 Days Discharge  Instructions: Apply moisturizing lotion as directed Prim Dressing: Hydrofera Blue Ready Transfer Foam, 4x5 (in/in) 1 x Per Week/30 Days ary Discharge Instructions: Apply to wound bed as instructed Secondary Dressing: Woven Gauze Sponge, Non-Sterile 4x4 in 1 x Per Week/30 Days Discharge Instructions: Apply over primary dressing as directed. Com pression Wrap: Unnaboot w/Calamine, 4x10 (in/yd) 1 x Per Week/30 Days Discharge Instructions: Apply Unnaboot as directed. WOUND #5: - Lower Leg Wound Laterality: Left, Anterior Cleanser: Soap and Water 1 x Per Week/30 Days Discharge Instructions: At Wound center-May shower and wash wound with dial antibacterial soap and water prior to dressing change. Peri-Wound Care: Triamcinolone 15 (g) 1 x Per Week/30 Days Discharge Instructions: Use triamcinolone 15 (g) as directed Peri-Wound Care: Sween Lotion (Moisturizing lotion) 1 x Per Week/30 Days Discharge Instructions: Apply moisturizing lotion as directed Prim Dressing: Hydrofera Blue Ready Transfer Foam, 4x5 (in/in) 1 x Per Week/30 Days ary Discharge Instructions: Apply to wound bed as instructed Secondary Dressing: Woven Gauze Sponge, Non-Sterile 4x4 in 1 x Per Week/30 Days Discharge Instructions: Apply over primary dressing as directed. Com pression Wrap: Unnaboot w/Calamine, 4x10 (in/yd) 1 x Per Week/30 Days Discharge Instructions: Apply Unnaboot as directed. WOUND #6: - Lower Leg Wound Laterality: Left, Lateral Cleanser: Soap and Water 1 x Per Week/30 Days Discharge Instructions: At Wound center-May shower and wash wound with dial antibacterial soap and water prior to dressing change. Peri-Wound Care: Triamcinolone 15 (g) 1 x Per Week/30 Days Discharge Instructions: Use triamcinolone 15 (g) as directed Peri-Wound Care: Sween Lotion (Moisturizing lotion) 1 x Per Week/30 Days Discharge Instructions: Apply moisturizing lotion as directed Prim Dressing: Hydrofera Blue Ready Transfer Foam, 4x5 (in/in)  1 x Per Week/30 Days ary Discharge Instructions: Apply to wound bed as instructed Secondary Dressing: Woven Gauze Sponge, Non-Sterile 4x4 in 1 x Per Week/30 Days Discharge Instructions: Apply over primary dressing as directed. Com pression Wrap: Unnaboot w/Calamine, 4x10 (in/yd) 1 x Per Week/30 Days Discharge Instructions: Apply Unnaboot as directed. 07/02/2023: This is Joan morbidly obese nondiabetic with lymphedema and chronic ulceration bilaterally. On her left lower leg, lateral aspect, there is Joan circular ulcer that penetrates into the fat layer. It has slough on the surface. There is some depth to the wound, likely owing to the patient's swelling. She has Joan similar looking, but slightly smaller wound, on the left medial lower leg, just below the level of the tibial tuberosity. On her right lateral lower leg, she has Joan pair of ulcers that almost look like Joan snake bite site, but they have been present for several years. She says these close and open spontaneously. She has Joan number of other smaller superficial ulcers on both legs. I used Joan curette to debride the slough from her wounds. We will apply Hydrofera Blue ready foam to each site. She was concerned about itching with the wraps so we will globally use triamcinolone to the  periwound skin and I am going to try calamine-based Unna boots to see if we can get an adequate degree of compression and allow the calamine to be soothing factor. She was instructed to keep her legs elevated is much as possible throughout the day and to shoot for 70 to 100 g of protein daily. I think she would benefit from lymphedema pumps in the long run and potentially alternative compression therapy such as Farrow wraps. Follow-up in 1 week. Electronic Signature(s) Signed: 07/02/2023 9:43:19 AM By: Duanne Guess MD FACS Entered By: Duanne Guess on 07/02/2023 09:43:19 [name] ([id number]) [id number] Page 14 of  15 -------------------------------------------------------------------------------- HxROS Details Patient Name: Date of Service: [name], [place] [name]. 07/02/2023 8:00 [name] Medical Record Number: [id number] Patient Account Number: [id number] Date of Birth/Sex: Treating RN: 13-Apr-1966 (57 y.o. [name] Primary Care Provider: PCP, [name]: Referring Provider: Treating Provider/Extender: Sherryl Manges in Treatment: 0 Information Obtained From Patient Constitutional Symptoms (General Health) Complaints and Symptoms: Negative for: Fatigue; Fever; Chills; Marked Weight Change Ear/Nose/Mouth/Throat Complaints and Symptoms: Negative for: Chronic sinus problems or rhinitis Hematologic/Lymphatic Medical History: Positive for: Anemia Respiratory Medical History: Positive for: Asthma Cardiovascular Medical History: Positive for: Congestive Heart Failure; Deep Vein Thrombosis; Hypertension Past Medical History Notes: Morbidly Obese Musculoskeletal Medical History: Positive for: Osteoarthritis Oncologic Immunizations Pneumococcal Vaccine: Received Pneumococcal Vaccination: No Implantable Devices None Hospitalization / Surgery History Type of Hospitalization/Surgery Tubal Ligation Family and Social History Unknown History: Yes; Never smoker; Marital Status - Divorced; Alcohol Use: Rarely; Drug Use: No History; Caffeine Use: Daily - Tea,Coffee ,Soda; Financial Concerns: No; Food, Clothing or Shelter Needs: No; Support System Lacking: No; Transportation Concerns: No Electronic Signature(s) Signed: 07/02/2023 10:35:48 AM By: Duanne Guess MD FACS Signed: 07/02/2023 5:38:09 PM By: Karie Schwalbe RN Entered By: Karie Schwalbe on 07/02/2023 08:25:49 [name] ([id number]) [id number] Page 15 of 15 -------------------------------------------------------------------------------- SuperBill Details Patient Name: Date of Service: Joan  Stephanie [name], [place] [name]. 07/02/2023 Medical Record Number: [id number] Patient Account Number: [id number] Date of Birth/Sex: Treating RN: 07-14-1966 (57 y.o. 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 I89.0 Lymphedema, not elsewhere classified I50.32 Chronic diastolic (congestive) heart failure E66.01 Morbid (severe) obesity due to excess calories Facility Procedures : CPT4 Code: [id number] Description: 99214 - WOUND CARE VISIT-LEV 4 EST PT Modifier: Quantity: 1 : CPT4 Code: [id number] Description: 97597 - DEBRIDE WOUND 1ST 20 SQ CM OR < ICD-10 Diagnosis Description L97.812 Non-pressure chronic ulcer of other part of right lower leg with fat layer expos L97.822 Non-pressure chronic ulcer of other part of left lower leg with fat layer  expose Modifier: ed d Quantity: 1 Physician Procedures : CPT4 Code Description Modifier [id number] - WC PHYS LEVEL 4 - NEW PT ICD-10 Diagnosis Description L97.812 Non-pressure chronic ulcer of other part of right lower leg with fat layer exposed L97.822 Non-pressure chronic ulcer of other part of left  lower leg with fat layer exposed I89.0 Lymphedema, not elsewhere classified I50.32 Chronic diastolic (congestive) heart failure Quantity: 1 : [id number] 97597 - WC PHYS DEBR WO ANESTH 20 SQ CM ICD-10 Diagnosis Description L97.812 Non-pressure chronic ulcer of other part of right lower leg with fat layer exposed L97.822 Non-pressure chronic  ulcer of other part of left lower leg with fat layer  exposed Quantity: 1 Electronic Signature(s) Signed: 07/02/2023 5:38:09 PM By: Karie Schwalbe RN Signed: 07/03/2023 7:38:47 AM By: Duanne Guess MD  FACS Previous Signature: 07/02/2023 9:43:47 AM Version By: Duanne Guess MD FACS Entered By: Karie Schwalbe on 07/02/2023 17:01:18

## 2023-07-03 NOTE — Progress Notes (Signed)
Joan, Joan Joan ([id number]) [id number] Page 1 of 19 Visit Report for 07/02/2023 Allergy List Details Patient Name: Date of Service: Joan Joan, [place] RY Joan. 07/02/2023 8:00 Joan Joan Medical Record Number: [id number] Patient Account Number: [id number] Date of Birth/Sex: Treating RN: 10-25-1965 (57 y.o. Joan Joan Primary Care Joan Joan: PCP, [name]: Referring Joan Joan: Treating Joan Joan/Extender: Joan Joan in Treatment: 0 Allergies Active Allergies No Known Allergies Allergy Notes Electronic Signature(s) Signed: 07/02/2023 5:38:09 PM By: Karie Schwalbe RN Entered By: Karie Schwalbe on 07/02/2023 05:19:45 -------------------------------------------------------------------------------- Arrival Information Details Patient Name: Date of Service: Joan Officer, [name] 07/02/2023 8:00 Joan Joan Medical Record Number: [id number] Patient Account Number: [id number] Date of Birth/Sex: Treating RN: [birth year] (57 y.o. Joan Joan Primary Care Joan Joan: PCP, [name]: Referring Joan Joan: Treating Joan Joan/Extender: Joan Joan in Treatment: 0 Visit Information Patient Arrived: Cane Arrival Time: 08:12 Accompanied By: self Transfer Assistance: None Patient Identification Verified: Yes Patient Has Alerts: Yes Patient Alerts: Lasix ABI R [place] (07/02/23) ABI L [place] (07/02/23) Electronic Signature(s) Signed: 07/02/2023 5:38:09 PM By: Karie Schwalbe RN Entered By: Karie Schwalbe on 07/02/2023 06:10:20 -------------------------------------------------------------------------------- Clinic Level of Care Assessment Details Patient Name: Date of Service: Joan Officer, [name] 07/02/2023 8:00 Joan [name], Joan Joan ([id number]) [phone].pdf Page 2 of 19 Medical Record Number: [id number] Patient Account Number: [id number] Date of Birth/Sex: Treating RN: 06-28-1966 (57 y.o. Joan Joan Primary Care Joan Joan: PCP, NO  Other Clinician: Referring Joan Joan: Treating Joan Joan/Extender: Joan Joan in Treatment: 0 Clinic Level of Care Assessment Items TOOL 1 Quantity Score X- 1 0 Use when EandM and Procedure is performed on INITIAL visit ASSESSMENTS - Nursing Assessment / Reassessment X- 1 20 General Physical Exam (combine w/ comprehensive assessment (listed just below) when performed on new pt. evals) X- 1 25 Comprehensive Assessment (HX, ROS, Risk Assessments, Wounds Hx, etc.) ASSESSMENTS - Wound and Skin Assessment / Reassessment X- 1 10 Dermatologic / Skin Assessment (not related to wound area) ASSESSMENTS - Ostomy and/or Continence Assessment and Care []  - 0 Incontinence Assessment and Management []  - 0 Ostomy Care Assessment and Management (repouching, etc.) PROCESS - Coordination of Care []  - 0 Simple Patient / Family Education for ongoing care X- 1 20 Complex (extensive) Patient / Family Education for ongoing care X- 1 10 Staff obtains Chiropractor, Records, T Results / Process Orders est X- 1 10 Staff telephones HHA, Nursing Homes / Clarify orders / etc []  - 0 Routine Transfer to another Facility (non-emergent condition) []  - 0 Routine Hospital Admission (non-emergent condition) X- 1 15 New Admissions / Manufacturing engineer / Ordering NPWT Apligraf, etc. , []  - 0 Emergency Hospital Admission (emergent condition) PROCESS - Special Needs []  - 0 Pediatric / Minor Patient Management []  - 0 Isolation Patient Management []  - 0 Hearing / Language / Visual special needs []  - 0 Assessment of Community assistance (transportation, D/C planning, etc.) []  - 0 Additional assistance / Altered mentation []  - 0 Support Surface(s) Assessment (bed, cushion, seat, etc.) INTERVENTIONS - Miscellaneous []  - 0 External ear exam []  - 0 Patient Transfer (multiple staff / Nurse, adult / Similar devices) []  - 0 Simple Staple / Suture removal (25 or less) []  - 0 Complex Staple / Suture  removal (26 or more) []  - 0 Hypo/Hyperglycemic Management (do not check if billed separately) X- 1 15 Ankle / Brachial Index (ABI) - do not check if billed separately Has the patient been seen at the hospital  within the last three years: Yes Total Score: 125 Level Of Care: New/Established - Level 4 Electronic Signature(s) Signed: 07/02/2023 5:38:09 PM By: Karie Schwalbe RN Entered By: Karie Schwalbe on 07/02/2023 13:59:57 Joan Joan ([id number]) [phone].pdf Page 3 of 19 -------------------------------------------------------------------------------- Compression Therapy Details Patient Name: Date of Service: Joan Joan, [place] RY Joan. 07/02/2023 8:00 Joan Joan Medical Record Number: [id number] Patient Account Number: [id number] Date of Birth/Sex: Treating RN: 12/26/1965 (57 y.o. Joan Joan Primary Care Joan Joan: PCP, [name]: Referring Joan Joan: Treating Joan Joan/Extender: Joan Joan in Treatment: 0 Compression Therapy Performed for Wound Assessment: Wound #1 Right,Anterior Lower Leg Performed By: Clinician Karie Schwalbe, RN Compression Type: Henriette Combs Post Procedure Diagnosis Same as Pre-procedure Electronic Signature(s) Signed: 07/02/2023 5:38:09 PM By: Karie Schwalbe RN Entered By: Karie Schwalbe on 07/02/2023 06:32:30 -------------------------------------------------------------------------------- Compression Therapy Details Patient Name: Date of Service: Joan Officer, [name] 07/02/2023 8:00 Joan Joan Medical Record Number: [id number] Patient Account Number: [id number] Date of Birth/Sex: Treating RN: [birth year] (57 y.o. Joan Joan Primary Care Joan Joan: PCP, [name]: Referring Joan Joan: Treating Joan Joan/Extender: Joan Joan in Treatment: 0 Compression Therapy Performed for Wound Assessment: Wound #2 Right,Lateral Lower Leg Performed By: Clinician Karie Schwalbe, RN Compression Type: Henriette Combs Post  Procedure Diagnosis Same as Pre-procedure Electronic Signature(s) Signed: 07/02/2023 5:38:09 PM By: Karie Schwalbe RN Entered By: Karie Schwalbe on 07/02/2023 06:33:01 -------------------------------------------------------------------------------- Compression Therapy Details Patient Name: Date of Service: Joan Officer, [name] 07/02/2023 8:00 Joan Joan Medical Record Number: [id number] Patient Account Number: [id number] Date of Birth/Sex: Treating RN: 10-18-1965 (57 y.o. Joan Joan Primary Care Clyda Smyth: PCP, [name]: Referring Buddy Loeffelholz: Treating Maribell Demeo/Extender: Joan Joan in Treatment: 0 Compression Therapy Performed for Wound Assessment: Wound #3 Left,Proximal,Medial Lower Leg Performed By: Clinician Karie Schwalbe, RN Compression Type: Henriette Combs Post Procedure Diagnosis MANASI, DISHON Joan ([id number]) [id number] Page 4 of 19 Same as Pre-procedure Electronic Signature(s) Signed: 07/02/2023 5:38:09 PM By: Karie Schwalbe RN Entered By: Karie Schwalbe on 07/02/2023 06:33:01 -------------------------------------------------------------------------------- Compression Therapy Details Patient Name: Date of Service: Joan Officer, [name] 07/02/2023 8:00 Joan Joan Medical Record Number: [id number] Patient Account Number: [id number] Date of Birth/Sex: Treating RN: 1966-05-03 (57 y.o. Joan Joan Primary Care Jacek Colson: PCP, [name]: Referring Shikara Mcauliffe: Treating Eddy Liszewski/Extender: Joan Joan in Treatment: 0 Compression Therapy Performed for Wound Assessment: Wound #4 Left,Distal,Medial Lower Leg Performed By: Clinician Karie Schwalbe, RN Compression Type: Henriette Combs Post Procedure Diagnosis Same as Pre-procedure Electronic Signature(s) Signed: 07/02/2023 5:38:09 PM By: Karie Schwalbe RN Entered By: Karie Schwalbe on 07/02/2023  06:33:01 -------------------------------------------------------------------------------- Compression Therapy Details Patient Name: Date of Service: Joan Officer, [name] 07/02/2023 8:00 Joan Joan Medical Record Number: [id number] Patient Account Number: [id number] Date of Birth/Sex: Treating RN: 02-11-1966 (57 y.o. Joan Joan Primary Care Aleph Nickson: PCP, [name]: Referring Estus Krakowski: Treating Malloree Raboin/Extender: Joan Joan in Treatment: 0 Compression Therapy Performed for Wound Assessment: Wound #5 Left,Anterior Lower Leg Performed By: Clinician Karie Schwalbe, RN Compression Type: Henriette Combs Post Procedure Diagnosis Same as Pre-procedure Electronic Signature(s) Signed: 07/02/2023 5:38:09 PM By: Karie Schwalbe RN Entered By: Karie Schwalbe on 07/02/2023 06:33:02 -------------------------------------------------------------------------------- Compression Therapy Details Patient Name: Date of Service: Joan Officer, [name] 07/02/2023 8:00 Joan Joan Medical Record Number: [id number] Patient Account Number: [id number] Joan Joan, Joan Joan ([id number]) [phone].pdf Page 5 of 19 Date of Birth/Sex: Treating RN: 03-25-1966 (57 y.o. Joan Joan Primary Care Lani Mendiola: PCP, NO  Other Clinician: Referring Carrington Olazabal: Treating Alvia Jablonski/Extender: Joan Joan in Treatment: 0 Compression Therapy Performed for Wound Assessment: Wound #6 Left,Lateral Lower Leg Performed By: Clinician Karie Schwalbe, RN Compression Type: Henriette Combs Post Procedure Diagnosis Same as Pre-procedure Electronic Signature(s) Signed: 07/02/2023 5:38:09 PM By: Karie Schwalbe RN Entered By: Karie Schwalbe on 07/02/2023 06:33:02 -------------------------------------------------------------------------------- Encounter Discharge Information Details Patient Name: Date of Service: Joan Officer, [name] 07/02/2023 8:00 Joan Joan Medical Record Number: [id number] Patient Account Number:  [id number] Date of Birth/Sex: Treating RN: 1966-06-16 (57 y.o. Joan Joan Primary Care Jammy Plotkin: PCP, [name]: Referring Solange Emry: Treating Ozie Lupe/Extender: Joan Joan in Treatment: 0 Encounter Discharge Information Items Post Procedure Vitals Discharge Condition: Stable Temperature (F): 98.6 Ambulatory Status: Cane Pulse (bpm): 108 Discharge Destination: Home Respiratory Rate (breaths/min): 18 Transportation: Private Auto Blood Pressure (mmHg): 165/110 Accompanied By: self Schedule Follow-up Appointment: Yes Clinical Summary of Care: Patient Declined Electronic Signature(s) Signed: 07/02/2023 5:38:09 PM By: Karie Schwalbe RN Entered By: Karie Schwalbe on 07/02/2023 14:05:04 -------------------------------------------------------------------------------- Lower Extremity Assessment Details Patient Name: Date of Service: Joan Officer, [name] 07/02/2023 8:00 Joan Joan Medical Record Number: [id number] Patient Account Number: [id number] Date of Birth/Sex: Treating RN: 11/03/1965 (57 y.o. Joan Joan Primary Care Blu Lori: PCP, [name]: Referring Druanne Bosques: Treating Hazleigh Mccleave/Extender: Joan Joan in Treatment: 0 Edema Assessment Assessed: [Left: No] [Right: No] [Left: Edema] [Right: :] Calf Left: Right: Point of Measurement: 32 cm From Medial Instep 48.5 cm 57 cm Ankle ELANY, FELIX Joan ([id number]) [phone].pdf Page 6 of 19 Left: Right: Point of Measurement: 10 cm From Medial Instep 28.5 cm 28 cm Knee To Floor Left: Right: From Medial Instep 42 cm 42 cm Vascular Assessment Pulses: Dorsalis Pedis Palpable: [Left:Yes] [Right:Yes] Doppler Audible: [Left:Yes] [Right:Yes] Posterior Tibial Palpable: [Left:Yes] [Right:Yes] Extremity colors, hair growth, and conditions: Extremity Color: [Left:Hyperpigmented] [Right:Hyperpigmented] Hair Growth on Extremity: [Left:No] [Right:No] Temperature of Extremity:  [Left:Warm] [Right:Warm] Capillary Refill: [Left:< 3 seconds] [Right:< 3 seconds] Dependent Rubor: [Left:No] [Right:No] Blanched when Elevated: [Left:No No] [Right:No No] Toe Nail Assessment Left: Right: Thick: Yes Yes Discolored: No No Deformed: No No Improper Length and Hygiene: No No Electronic Signature(s) Signed: 07/02/2023 5:38:09 PM By: Karie Schwalbe RN Entered By: Karie Schwalbe on 07/02/2023 05:33:56 -------------------------------------------------------------------------------- Multi Wound Chart Details Patient Name: Date of Service: Joan Officer, [name] 07/02/2023 8:00 Joan Joan Medical Record Number: [id number] Patient Account Number: [id number] Date of Birth/Sex: Treating RN: Aug 09, 1966 (57 y.o. F) Primary Care Vivia Rosenburg: PCP, [name]: Referring Ninfa Giannelli: Treating Talibah Colasurdo/Extender: Joan Joan in Treatment: 0 Vital Signs Height(in): 71 Pulse(bpm): 108 Weight(lbs): 352 Blood Pressure(mmHg): 165/110 Body Mass Index(BMI): 49.1 Temperature(F): 98.6 Respiratory Rate(breaths/min): 18 [1:Photos:] Right, Anterior Lower Leg Right, Lateral Lower Leg Left, Proximal, Medial Lower Leg Wound Location: Gradually Appeared Gradually Appeared Gradually Appeared Wounding Event: Lymphedema Lymphedema Lymphedema Primary Etiology: Joan Joan, Joan Joan ([id number]) [id number] Page 7 of 19 Anemia, Asthma, Congestive Heart Anemia, Asthma, Congestive Heart Anemia, Asthma, Congestive Heart Comorbid History: Failure, Deep Vein Thrombosis, Failure, Deep Vein Thrombosis, Failure, Deep Vein Thrombosis, Hypertension, Osteoarthritis Hypertension, Osteoarthritis Hypertension, Osteoarthritis 04/01/2023 07/01/2021 04/01/2023 Date Acquired: 0 0 0 Weeks of Treatment: Open Open Open Wound Status: No No No Wound Recurrence: Yes Yes No Clustered Wound: 3x2x0.1 6.5x1x0.1 2x2.5x0.3 Measurements L x W x D (cm) 4.712 5.105 3.927 Joan (cm) : rea 0.471 0.511  1.178 Volume (cm) : Full Thickness Without Exposed Full Thickness Without Exposed Full Thickness Without Exposed Classification: Support Structures Support Structures  Support Structures Medium Medium Medium Exudate Joan mount: Serosanguineous Serosanguineous Serosanguineous Exudate Type: red, brown red, brown red, brown Exudate Color: Distinct, outline attached Distinct, outline attached Distinct, outline attached Wound Margin: Large (67-100%) Medium (34-66%) N/Joan Granulation Joan mount: Red, Pink Red N/Joan Granulation Quality: Small (1-33%) Medium (34-66%) N/Joan Necrotic Joan mount: Adherent Slough Adherent Slough N/Joan Necrotic Tissue: Fat Layer (Subcutaneous Tissue): Yes Fat Layer (Subcutaneous Tissue): Yes N/Joan Exposed Structures: Fascia: No Fascia: No Tendon: No Tendon: No Muscle: No Muscle: No Joint: No Joint: No Bone: No Bone: No Small (1-33%) Small (1-33%) Small (1-33%) Epithelialization: Debridement - Selective/Open Wound Debridement - Selective/Open Wound Debridement - Selective/Open Wound Debridement: Pre-procedure Verification/Time Out 09:20 09:20 09:20 Taken: Lidocaine 4% Topical Solution Lidocaine 4% Topical Solution Lidocaine 4% Topical Solution Pain Control: [employer] Tissue Debrided: Non-Viable Tissue Non-Viable Tissue Non-Viable Tissue Level: 4.71 5.1 3.92 Debridement Joan (sq cm): rea Curette Curette Curette Instrument: Minimum Minimum Minimum Bleeding: Pressure Pressure Pressure Hemostasis Joan chieved: 0 0 0 Procedural Pain: 0 0 0 Post Procedural Pain: Procedure was tolerated well Procedure was tolerated well Procedure was tolerated well Debridement Treatment Response: 3x2x0.1 6.5x1x0.1 2x2.5x0.3 Post Debridement Measurements L x W x D (cm) 0.471 0.511 1.178 Post Debridement Volume: (cm) Scarring: Yes Scarring: Yes Periwound Skin Texture: No Abnormalities Noted No Abnormalities Noted Periwound Skin Moisture: Hemosiderin Staining:  Yes Hemosiderin Staining: Yes Periwound Skin Color: No Abnormality No Abnormality N/Joan Temperature: Compression Therapy Compression Therapy Compression Therapy Procedures Performed: Debridement Debridement Debridement Wound Number: [id number]  Photos: Left, Distal, Medial Lower Leg Left, Anterior Lower Leg Left, Lateral Lower Leg Wound Location: Gradually Appeared Gradually Appeared Gradually Appeared Wounding Event: Lymphedema Lymphedema Lymphedema Primary Etiology: Anemia, Asthma, Congestive Heart Anemia, Asthma, Congestive Heart Anemia, Asthma, Congestive Heart Comorbid History: Failure, Deep Vein Thrombosis, Failure, Deep Vein Thrombosis, Failure, Deep Vein Thrombosis, Hypertension, Osteoarthritis Hypertension, Osteoarthritis Hypertension, Osteoarthritis 06/18/2023 06/19/2023 08/09/2020 Date Acquired: 0 0 0 Weeks of Treatment: Open Open Open Wound Status: No No No Wound Recurrence: No Yes No Clustered Wound: 0.5x0.5x0.1 5x2x0.1 2.3x2x0.3 Measurements L x W x D (cm) 0.196 7.854 3.613 Joan (cm) : rea 0.02 0.785 1.084 Volume (cm) : Full Thickness Without Exposed Full Thickness Without Exposed Full Thickness Without Exposed Classification: Support Structures Support Structures Support Structures Medium Medium Medium Exudate Amount: Serosanguineous Serosanguineous Serosanguineous Exudate Type: red, brown red, brown red, brown Exudate Color: N/Joan Distinct, outline attached Distinct, outline attached Wound Margin: Small (1-33%) Small (1-33%) Medium (34-66%) Granulation Amount: Red Red Red Granulation Quality: Large (67-100%) Large (67-100%) Medium (34-66%) Necrotic Amount: Joan Joan, Joan Joan ([id number]) [phone].pdf Page 8 of 19 Eschar, Adherent Slough Adherent [employer] Necrotic Tissue: Fat Layer (Subcutaneous Tissue): Yes Fat Layer (Subcutaneous Tissue): Yes Fat Layer (Subcutaneous Tissue): Yes Exposed Structures: Fascia: No Fascia:  No Tendon: No Tendon: No Muscle: No Muscle: No Joint: No Joint: No Bone: No Bone: No Small (1-33%) Small (1-33%) Small (1-33%) Epithelialization: Debridement - Selective/Open Wound Debridement - Selective/Open Wound Debridement - Selective/Open Wound Debridement: Pre-procedure Verification/Time Out 09:20 09:20 09:20 Taken: Lidocaine 4% Topical Solution Lidocaine 4% Topical Solution Lidocaine 4% Topical Solution Pain Control: [facility] Tissue Debrided: Non-Viable Tissue Non-Viable Tissue Non-Viable Tissue Level: 0.2 7.85 3.61 Debridement Joan (sq cm): rea Curette Curette Curette Instrument: Minimum Minimum Minimum Bleeding: Pressure Pressure Pressure Hemostasis Joan chieved: 0 0 0 Procedural Pain: 0 0 0 Post Procedural Pain: Procedure was tolerated well Procedure was tolerated well Procedure was tolerated well Debridement Treatment Response: 0.5x0.5x0.1 5x2x0.1 2.3x2x0.3 Post  Debridement Measurements L x W x D (cm) 0.02 0.785 1.084 Post Debridement Volume: (cm) Scarring: Yes Scarring: Yes Scarring: Yes Periwound Skin Texture: No Abnormalities Noted No Abnormalities Noted No Abnormalities Noted Periwound Skin Moisture: Hemosiderin Staining: Yes Hemosiderin Staining: Yes Hemosiderin Staining: Yes Periwound Skin Color: No Abnormality No Abnormality No Abnormality Temperature: Compression Therapy Compression Therapy Compression Therapy Procedures Performed: Debridement Debridement Debridement Treatment Notes Electronic Signature(s) Signed: 07/02/2023 9:33:57 AM By: Duanne Guess MD FACS Entered By: Duanne Guess on 07/02/2023 06:33:57 -------------------------------------------------------------------------------- Multi-Disciplinary Care Plan Details Patient Name: Date of Service: Joan Officer, [name] 07/02/2023 8:00 Joan Joan Medical Record Number: [id number] Patient Account Number: [id number] Date of Birth/Sex: Treating RN: 03-15-1966 (57 y.o. Joan Joan Primary Care Broady Lafoy: PCP, [name]: Referring Lyndal Alamillo: Treating Nora Sabey/Extender: Joan Joan in Treatment: 0 Active Inactive Wound/Skin Impairment Nursing Diagnoses: Impaired tissue integrity Goals: Patient/caregiver will verbalize understanding of skin care regimen Date Initiated: 07/02/2023 Target Resolution Date: 09/25/2023 Goal Status: Active Interventions: Assess ulceration(s) every visit Treatment Activities: Skin care regimen initiated : 07/02/2023 Notes: Electronic Signature(s) Signed: 07/02/2023 5:38:09 PM By: Karie Schwalbe RN [name] ([id number]) [phone].pdf Page 9 of 19 Signed: 07/02/2023 5:38:09 PM By: Karie Schwalbe RN Entered By: Karie Schwalbe on 07/02/2023 13:51:55 -------------------------------------------------------------------------------- Pain Assessment Details Patient Name: Date of Service: Joan Officer, [name] 07/02/2023 8:00 Joan Joan Medical Record Number: [id number] Patient Account Number: [id number] Date of Birth/Sex: Treating RN: 12-Apr-1966 (57 y.o. Joan Joan Primary Care Sayler Mickiewicz: PCP, [name]: Referring Orrie Lascano: Treating Peyson Delao/Extender: Joan Joan in Treatment: 0 Active Problems Location of Pain Severity and Description of Pain Patient Has Paino Yes Site Locations Pain Location: Generalized Pain With Dressing Change: No Duration of the Pain. Constant / Intermittento Constant Rate the pain. Current Pain Level: 7 Worst Pain Level: 10 Least Pain Level: 3 Tolerable Pain Level: 6 Character of Pain Describe the Pain: Difficult to Pinpoint Pain Management and Medication Current Pain Management: Medication: Yes Cold Application: No Rest: Yes Massage: No Activity: No T.E.N.S.: No Heat Application: No Leg drop or elevation: No Is the Current Pain Management Adequate: Adequate How does your wound impact your activities of daily livingo Sleep:  No Bathing: No Appetite: No Relationship With Others: No Bladder Continence: No Emotions: No Bowel Continence: No Work: No Toileting: No Drive: No Dressing: No Hobbies: No Electronic Signature(s) Signed: 07/02/2023 5:38:09 PM By: Karie Schwalbe RN Entered By: Karie Schwalbe on 07/02/2023 06:03:00 [name] ([id number]) [id number] Page 10 of 19 -------------------------------------------------------------------------------- Patient/Caregiver Education Details Patient Name: Date of Service: Joan Joan, [place] RY Joan. 10/7/2024andnbsp8:00 Joan Joan Medical Record Number: [id number] Patient Account Number: [id number] Date of Birth/Gender: Treating RN: 09-28-1965 (57 y.o. Joan Joan Primary Care Physician: PCP, [name]: Referring Physician: Treating Physician/Extender: Joan Joan in Treatment: 0 Education Assessment Education Provided To: Patient Education Topics Provided Wound/Skin Impairment: Methods: Explain/Verbal Responses: See progress note Electronic Signature(s) Signed: 07/02/2023 5:38:09 PM By: Karie Schwalbe RN Entered By: Karie Schwalbe on 07/02/2023 13:52:05 -------------------------------------------------------------------------------- Wound Assessment Details Patient Name: Date of Service: Joan Officer, [name] 07/02/2023 8:00 Joan Joan Medical Record Number: [id number] Patient Account Number: [id number] Date of Birth/Sex: Treating RN: 01-11-1966 (57 y.o. Joan Joan Primary Care Aeric Burnham: PCP, [name]: Referring Pansie Guggisberg: Treating Kharee Lesesne/Extender: Joan Joan in Treatment: 0 Wound Status Wound Number: 1 Primary Lymphedema Etiology: Wound Location: Right, Anterior Lower Leg Wound Open Wounding Event: Gradually Appeared Status: Date Acquired:  04/01/2023 Comorbid Anemia, Asthma, Congestive Heart Failure, Deep Vein Weeks Of Treatment: 0 History: Thrombosis, Hypertension,  Osteoarthritis Clustered Wound: Yes Photos Wound Measurements Length: (cm) 3 Width: (cm) 2 Depth: (cm) 0.1 Area: (cm) 4.712 Volume: (cm) 0.471 % Reduction in Area: % Reduction in Volume: Epithelialization: Small (1-33%) Tunneling: No Undermining: No Wound Description Joan Joan, Joan Joan ([id number]) Classification: Full Thickness Without Exposed Support Structures Wound Margin: Distinct, outline attached Exudate Amount: Medium Exudate Type: Serosanguineous Exudate Color: red, brown [id number] Page 11 of 19 Foul Odor After Cleansing: No Slough/Fibrino Yes Wound Bed Granulation Amount: Large (67-100%) Exposed Structure Granulation Quality: Red, Pink Fascia Exposed: No Necrotic Amount: Small (1-33%) Fat Layer (Subcutaneous Tissue) Exposed: Yes Necrotic Quality: Adherent Slough Tendon Exposed: No Muscle Exposed: No Joint Exposed: No Bone Exposed: No Periwound Skin Texture Texture Color No Abnormalities Noted: No No Abnormalities Noted: No Scarring: Yes Hemosiderin Staining: Yes Moisture Temperature / Pain No Abnormalities Noted: Yes Temperature: No Abnormality Treatment Notes Wound #1 (Lower Leg) Wound Laterality: Right, Anterior Cleanser Soap and Water Discharge Instruction: At Wound center-May shower and wash wound with dial antibacterial soap and water prior to dressing change. Peri-Wound Care Triamcinolone 15 (g) Discharge Instruction: Use triamcinolone 15 (g) as directed Sween Lotion (Moisturizing lotion) Discharge Instruction: Apply moisturizing lotion as directed Topical Primary Dressing Hydrofera Blue Ready Transfer Foam, 4x5 (in/in) Discharge Instruction: Apply to wound bed as instructed Secondary Dressing Woven Gauze Sponge, Non-Sterile 4x4 in Discharge Instruction: Apply over primary dressing as directed. Secured With Compression Wrap Unnaboot w/Calamine, 4x10 (in/yd) Discharge Instruction: Apply Unnaboot as  directed. Compression Stockings Add-Ons Electronic Signature(s) Signed: 07/02/2023 5:38:09 PM By: Karie Schwalbe RN Entered By: Karie Schwalbe on 07/02/2023 06:00:01 -------------------------------------------------------------------------------- Wound Assessment Details Patient Name: Date of Service: Joan Officer, [name] 07/02/2023 8:00 Joan Joan Medical Record Number: [id number] Patient Account Number: [id number] Date of Birth/Sex: Treating RN: 13-Apr-1966 (57 y.o. Joan Joan Primary Care [place] Vierling: PCP, [name]: Referring Zineb Glade: Treating Joan Joan/Extender: Mariyah, Upshaw ([id number]) [id number] Page 12 of 19 Weeks in Treatment: 0 Wound Status Wound Number: 2 Primary Lymphedema Etiology: Wound Location: Right, Lateral Lower Leg Wound Open Wounding Event: Gradually Appeared Status: Date Acquired: 07/01/2021 Comorbid Anemia, Asthma, Congestive Heart Failure, Deep Vein Weeks Of Treatment: 0 History: Thrombosis, Hypertension, Osteoarthritis Clustered Wound: Yes Photos Wound Measurements Length: (cm) 6.5 Width: (cm) 1 Depth: (cm) 0.1 Area: (cm) 5.105 Volume: (cm) 0.511 % Reduction in Area: % Reduction in Volume: Epithelialization: Small (1-33%) Tunneling: No Undermining: No Wound Description Classification: Full Thickness Without Exposed Support Structures Wound Margin: Distinct, outline attached Exudate Amount: Medium Exudate Type: Serosanguineous Exudate Color: red, brown Foul Odor After Cleansing: No Slough/Fibrino Yes Wound Bed Granulation Amount: Medium (34-66%) Exposed Structure Granulation Quality: Red Fascia Exposed: No Necrotic Amount: Medium (34-66%) Fat Layer (Subcutaneous Tissue) Exposed: Yes Necrotic Quality: Adherent Slough Tendon Exposed: No Muscle Exposed: No Joint Exposed: No Bone Exposed: No Periwound Skin Texture Texture Color No Abnormalities Noted: No No Abnormalities Noted:  No Scarring: Yes Hemosiderin Staining: Yes Moisture Temperature / Pain No Abnormalities Noted: Yes Temperature: No Abnormality Treatment Notes Wound #2 (Lower Leg) Wound Laterality: Right, Lateral Cleanser Soap and Water Discharge Instruction: At Wound center-May shower and wash wound with dial antibacterial soap and water prior to dressing change. Peri-Wound Care Triamcinolone 15 (g) Discharge Instruction: Use triamcinolone 15 (g) as directed Sween Lotion (Moisturizing lotion) Discharge Instruction: Apply moisturizing lotion as directed Topical Primary Dressing Hydrofera Blue Ready Transfer Foam, 4x5 (in/in) Discharge Instruction:  Apply to wound bed as instructed Joan Joan, Joan Joan ([id number]) [id number] Page 13 of 19 Secondary Dressing Woven Gauze Sponge, Non-Sterile 4x4 in Discharge Instruction: Apply over primary dressing as directed. Secured With Compression Wrap Unnaboot w/Calamine, 4x10 (in/yd) Discharge Instruction: Apply Unnaboot as directed. Compression Stockings Add-Ons Electronic Signature(s) Signed: 07/02/2023 5:38:09 PM By: Karie Schwalbe RN Entered By: Karie Schwalbe on 07/02/2023 06:00:31 -------------------------------------------------------------------------------- Wound Assessment Details Patient Name: Date of Service: Joan Officer, [name] 07/02/2023 8:00 Joan Joan Medical Record Number: [id number] Patient Account Number: [id number] Date of Birth/Sex: Treating RN: 11/03/1965 (57 y.o. Joan Joan Primary Care Sway Guttierrez: PCP, [name]: Referring Adyen Bifulco: Treating Damarco Keysor/Extender: Joan Joan in Treatment: 0 Wound Status Wound Number: 3 Primary Lymphedema Etiology: Wound Location: Left, Proximal, Medial Lower Leg Wound Open Wounding Event: Gradually Appeared Status: Date Acquired: 04/01/2023 Comorbid Anemia, Asthma, Congestive Heart Failure, Deep Vein Weeks Of Treatment: 0 History: Thrombosis,  Hypertension, Osteoarthritis Clustered Wound: No Photos Wound Measurements Length: (cm) 2 Width: (cm) 2.5 Depth: (cm) 0.3 Area: (cm) 3.927 Volume: (cm) 1.178 % Reduction in Area: % Reduction in Volume: Epithelialization: Small (1-33%) Tunneling: No Undermining: No Wound Description Classification: Full Thickness Without Exposed Support Structures Wound Margin: Distinct, outline attached Exudate Amount: Medium Exudate Type: Serosanguineous Exudate Color: red, brown Foul Odor After Cleansing: No Slough/Fibrino Yes Periwound Skin Texture Texture Color Joan Joan, Joan Joan ([id number]) [phone].pdf Page 14 of 19 No Abnormalities Noted: No No Abnormalities Noted: No Moisture No Abnormalities Noted: No Treatment Notes Wound #3 (Lower Leg) Wound Laterality: Left, Medial, Proximal Cleanser Soap and Water Discharge Instruction: At Wound center-May shower and wash wound with dial antibacterial soap and water prior to dressing change. Peri-Wound Care Triamcinolone 15 (g) Discharge Instruction: Use triamcinolone 15 (g) as directed Sween Lotion (Moisturizing lotion) Discharge Instruction: Apply moisturizing lotion as directed Topical Primary Dressing Hydrofera Blue Ready Transfer Foam, 4x5 (in/in) Discharge Instruction: Apply to wound bed as instructed Secondary Dressing Woven Gauze Sponge, Non-Sterile 4x4 in Discharge Instruction: Apply over primary dressing as directed. Secured With Compression Wrap Unnaboot w/Calamine, 4x10 (in/yd) Discharge Instruction: Apply Unnaboot as directed. Compression Stockings Add-Ons Electronic Signature(s) Signed: 07/02/2023 5:38:09 PM By: Karie Schwalbe RN Entered By: Karie Schwalbe on 07/02/2023 06:01:01 -------------------------------------------------------------------------------- Wound Assessment Details Patient Name: Date of Service: Joan Officer, [name] 07/02/2023 8:00 Joan Joan Medical Record Number: [id number] Patient  Account Number: [id number] Date of Birth/Sex: Treating RN: 1966-04-15 (57 y.o. Joan Joan Primary Care Myrian Botello: PCP, [name]: Referring Alante Tolan: Treating Allizon Woznick/Extender: Joan Joan in Treatment: 0 Wound Status Wound Number: 4 Primary Lymphedema Etiology: Wound Location: Left, Distal, Medial Lower Leg Wound Open Wounding Event: Gradually Appeared Status: Date Acquired: 06/18/2023 Comorbid Anemia, Asthma, Congestive Heart Failure, Deep Vein Weeks Of Treatment: 0 History: Thrombosis, Hypertension, Osteoarthritis Clustered Wound: No Photos Joan Joan, Joan Joan ([id number]) [id number] Page 15 of 19 Wound Measurements Length: (cm) 0.5 Width: (cm) 0.5 Depth: (cm) 0.1 Area: (cm) 0.196 Volume: (cm) 0.02 % Reduction in Area: % Reduction in Volume: Epithelialization: Small (1-33%) Tunneling: No Undermining: No Wound Description Classification: Full Thickness Without Exposed Suppor Exudate Amount: Medium Exudate Type: Serosanguineous Exudate Color: red, brown t Structures Foul Odor After Cleansing: No Slough/Fibrino Yes Wound Bed Granulation Amount: Small (1-33%) Exposed Structure Granulation Quality: Red Fat Layer (Subcutaneous Tissue) Exposed: Yes Necrotic Amount: Large (67-100%) Necrotic Quality: Eschar, Adherent Slough Periwound Skin Texture Texture Color No Abnormalities Noted: No No Abnormalities Noted: No Scarring: Yes Hemosiderin Staining: Yes Moisture Temperature / Pain No  Abnormalities Noted: Yes Temperature: No Abnormality Treatment Notes Wound #4 (Lower Leg) Wound Laterality: Left, Medial, Distal Cleanser Soap and Water Discharge Instruction: At Wound center-May shower and wash wound with dial antibacterial soap and water prior to dressing change. Peri-Wound Care Triamcinolone 15 (g) Discharge Instruction: Use triamcinolone 15 (g) as directed Sween Lotion (Moisturizing lotion) Discharge Instruction:  Apply moisturizing lotion as directed Topical Primary Dressing Hydrofera Blue Ready Transfer Foam, 4x5 (in/in) Discharge Instruction: Apply to wound bed as instructed Secondary Dressing Woven Gauze Sponge, Non-Sterile 4x4 in Discharge Instruction: Apply over primary dressing as directed. Secured With Compression Wrap Unnaboot w/Calamine, 4x10 (in/yd) Discharge Instruction: Apply Unnaboot as directed. Compression Stockings Add-Ons Electronic Signature(s) ZAYLA, AGAR Joan ([id number]) [id number] Page 16 of 19 Signed: 07/02/2023 5:38:09 PM By: Karie Schwalbe RN Entered By: Karie Schwalbe on 07/02/2023 06:01:40 -------------------------------------------------------------------------------- Wound Assessment Details Patient Name: Date of Service: Joan Officer, [name] 07/02/2023 8:00 Joan Joan Medical Record Number: [id number] Patient Account Number: [id number] Date of Birth/Sex: Treating RN: [birth year] (57 y.o. Joan Joan Primary Care [place] Pelto: PCP, [name]: Referring Pharrah Rottman: Treating Breunna Nordmann/Extender: Joan Joan in Treatment: 0 Wound Status Wound Number: 5 Primary Lymphedema Etiology: Wound Location: Left, Anterior Lower Leg Wound Open Wounding Event: Gradually Appeared Status: Date Acquired: 06/19/2023 Comorbid Anemia, Asthma, Congestive Heart Failure, Deep Vein Weeks Of Treatment: 0 History: Thrombosis, Hypertension, Osteoarthritis Clustered Wound: Yes Photos Wound Measurements Length: (cm) 5 Width: (cm) 2 Depth: (cm) 0.1 Area: (cm) 7.854 Volume: (cm) 0.785 % Reduction in Area: % Reduction in Volume: Epithelialization: Small (1-33%) Tunneling: No Undermining: No Wound Description Classification: Full Thickness Without Exposed Support Structures Wound Margin: Distinct, outline attached Exudate Amount: Medium Exudate Type: Serosanguineous Exudate Color: red, brown Foul Odor After Cleansing: No Slough/Fibrino  Yes Wound Bed Granulation Amount: Small (1-33%) Exposed Structure Granulation Quality: Red Fascia Exposed: No Necrotic Amount: Large (67-100%) Fat Layer (Subcutaneous Tissue) Exposed: Yes Necrotic Quality: Adherent Slough Tendon Exposed: No Muscle Exposed: No Joint Exposed: No Bone Exposed: No Periwound Skin Texture Texture Color No Abnormalities Noted: No No Abnormalities Noted: No Scarring: Yes Hemosiderin Staining: Yes Moisture Temperature / Pain No Abnormalities Noted: Yes Temperature: No Abnormality Joan Joan, Joan Joan ([id number]) [id number].pdf Page 17 of 19 Treatment Notes Wound #5 (Lower Leg) Wound Laterality: Left, Anterior Cleanser Soap and Water Discharge Instruction: At Wound center-May shower and wash wound with dial antibacterial soap and water prior to dressing change. Peri-Wound Care Triamcinolone 15 (g) Discharge Instruction: Use triamcinolone 15 (g) as directed Sween Lotion (Moisturizing lotion) Discharge Instruction: Apply moisturizing lotion as directed Topical Primary Dressing Hydrofera Blue Ready Transfer Foam, 4x5 (in/in) Discharge Instruction: Apply to wound bed as instructed Secondary Dressing Woven Gauze Sponge, Non-Sterile 4x4 in Discharge Instruction: Apply over primary dressing as directed. Secured With Compression Wrap Unnaboot w/Calamine, 4x10 (in/yd) Discharge Instruction: Apply Unnaboot as directed. Compression Stockings Add-Ons Electronic Signature(s) Signed: 07/02/2023 5:38:09 PM By: Karie Schwalbe RN Entered By: Karie Schwalbe on 07/02/2023 06:02:19 -------------------------------------------------------------------------------- Wound Assessment Details Patient Name: Date of Service: Joan Officer, [name] 07/02/2023 8:00 Joan Joan Medical Record Number: [id number] Patient Account Number: [id number] Date of Birth/Sex: Treating RN: [birth year] (57 y.o. Joan Joan Primary Care Vashaun Osmon: PCP, NO Other  Clinician: Referring Caleb Prigmore: Treating Ovie Cornelio/Extender: Joan Joan in Treatment: 0 Wound Status Wound Number: 6 Primary Lymphedema Etiology: Wound Location: Left, Lateral Lower Leg Wound Open Wounding Event: Gradually Appeared Status: Date Acquired: 08/09/2020 Comorbid Anemia, Asthma, Congestive Heart Failure, Deep Vein Weeks Of Treatment: 0  History: Thrombosis, Hypertension, Osteoarthritis Clustered Wound: No Photos Joan Joan, Joan Joan ([id number]) [id number] Page 18 of 19 Wound Measurements Length: (cm) 2.3 Width: (cm) 2 Depth: (cm) 0.3 Area: (cm) 3.613 Volume: (cm) 1.084 % Reduction in Area: % Reduction in Volume: Epithelialization: Small (1-33%) Tunneling: No Undermining: No Wound Description Classification: Full Thickness Without Exposed Suppor Wound Margin: Distinct, outline attached Exudate Amount: Medium Exudate Type: Serosanguineous Exudate Color: red, brown t Structures Foul Odor After Cleansing: No Slough/Fibrino Yes Wound Bed Granulation Amount: Medium (34-66%) Exposed Structure Granulation Quality: Red Fascia Exposed: No Necrotic Amount: Medium (34-66%) Fat Layer (Subcutaneous Tissue) Exposed: Yes Necrotic Quality: Adherent Slough Tendon Exposed: No Muscle Exposed: No Joint Exposed: No Bone Exposed: No Periwound Skin Texture Texture Color No Abnormalities Noted: No No Abnormalities Noted: No Scarring: Yes Hemosiderin Staining: Yes Moisture Temperature / Pain No Abnormalities Noted: Yes Temperature: No Abnormality Treatment Notes Wound #6 (Lower Leg) Wound Laterality: Left, Lateral Cleanser Soap and Water Discharge Instruction: At Wound center-May shower and wash wound with dial antibacterial soap and water prior to dressing change. Peri-Wound Care Triamcinolone 15 (g) Discharge Instruction: Use triamcinolone 15 (g) as directed Sween Lotion (Moisturizing lotion) Discharge Instruction: Apply moisturizing  lotion as directed Topical Primary Dressing Hydrofera Blue Ready Transfer Foam, 4x5 (in/in) Discharge Instruction: Apply to wound bed as instructed Secondary Dressing Woven Gauze Sponge, Non-Sterile 4x4 in Discharge Instruction: Apply over primary dressing as directed. Secured With Compression Wrap Unnaboot w/Calamine, 4x10 (in/yd) Discharge Instruction: Apply Unnaboot as directed. Joan Joan, Joan Joan ([id number]) [id number] Page 19 of 19 Compression Stockings Add-Ons Electronic Signature(s) Signed: 07/02/2023 5:38:09 PM By: Karie Schwalbe RN Entered By: Karie Schwalbe on 07/02/2023 06:02:43 -------------------------------------------------------------------------------- Vitals Details Patient Name: Date of Service: Joan Officer, [name] 07/02/2023 8:00 Joan Joan Medical Record Number: [id number] Patient Account Number: [id number] Date of Birth/Sex: Treating RN: 1966/08/02 (57 y.o. Joan Joan Primary Care Kyon Bentler: PCP, [name]: Referring Nashia Remus: Treating Namari Breton/Extender: Joan Joan in Treatment: 0 Vital Signs Time Taken: 08:12 Temperature (F): 98.6 Height (in): 71 Pulse (bpm): 108 Weight (lbs): 352 Respiratory Rate (breaths/min): 18 Body Mass Index (BMI): 49.1 Blood Pressure (mmHg): 165/110 Reference Range: 80 - 120 mg / dl Electronic Signature(s) Signed: 07/02/2023 5:38:09 PM By: Karie Schwalbe RN Entered By: Karie Schwalbe on 07/02/2023 06:03:33

## 2023-07-03 NOTE — Progress Notes (Signed)
Joan Joan, Joan Joan ([id number]) [id number] Page 1 of 4 Visit Report for 07/02/2023 Abuse Risk Screen Details Patient Name: Date of Service: Joan Joan, [place] RY Joan. 07/02/2023 8:00 Joan M Medical Record Number: [id number] Patient Account Number: [id number] Date of Birth/Sex: Treating RN: 1965-10-25 (57 y.o. [name] Primary Care Marquavion Venhuizen: PCP, [name]: Referring Kayzen Kendzierski: Treating Keyan Folson/Extender: Sherryl Manges in Treatment: 0 Abuse Risk Screen Items Answer ABUSE RISK SCREEN: Has anyone close to you tried to hurt or harm you recentlyo No Do you feel uncomfortable with anyone in your familyo No Has anyone forced you do things that you didnt want to doo No Electronic Signature(s) Signed: 07/02/2023 5:38:09 PM By: Karie Schwalbe RN Entered By: Karie Schwalbe on 07/02/2023 05:25:56 -------------------------------------------------------------------------------- Activities of Daily Living Details Patient Name: Date of Service: Joan Joan, [place] RY Joan. 07/02/2023 8:00 Joan M Medical Record Number: [id number] Patient Account Number: [id number] Date of Birth/Sex: Treating RN: 29-Oct-1965 (57 y.o. [name] Primary Care Ollen Rao: PCP, [name]: Referring Micah Barnier: Treating Camala Talwar/Extender: Sherryl Manges in Treatment: 0 Activities of Daily Living Items Answer Activities of Daily Living (Please select one for each item) Drive Automobile Completely Able T Medications ake Completely Able Use T elephone Completely Able Care for Appearance Completely Able Use T oilet Completely Able Bath / Shower Completely Able Dress Self Completely Able Feed Self Completely Able Walk Completely Able Get In / Out Bed Completely Able Housework Completely Able Prepare Meals Completely Able Handle Money Completely Able Shop for Self Completely Able Electronic Signature(s) Signed: 07/02/2023 5:38:09 PM By: Karie Schwalbe RN Entered  By: Karie Schwalbe on 07/02/2023 05:26:27 [name] ([id number]) [id number] Page 2 of 4 -------------------------------------------------------------------------------- Education Screening Details Patient Name: Date of Service: Joan Joan, [place] RY Joan. 07/02/2023 8:00 Joan M Medical Record Number: [id number] Patient Account Number: [id number] Date of Birth/Sex: Treating RN: 03-19-1966 (57 y.o. [name] Primary Care Carlis Blanchard: PCP, [name]: Referring Janeann Paisley: Treating Jolea Dolle/Extender: Sherryl Manges in Treatment: 0 Primary Learner Assessed: Patient Learning Preferences/Education Level/Primary Language Learning Preference: Explanation, Demonstration, Printed Material Highest Education Level: College or Above Preferred Language: English Cognitive Barrier Language Barrier: No Translator Needed: No Memory Deficit: No Emotional Barrier: No Cultural/Religious Beliefs Affecting Medical Care: No Physical Barrier Impaired Vision: No Impaired Hearing: No Decreased Hand dexterity: No Knowledge/Comprehension Knowledge Level: High Comprehension Level: High Ability to understand written instructions: High Ability to understand verbal instructions: High Motivation Anxiety Level: Calm Cooperation: Cooperative Education Importance: Acknowledges Need Interest in Health Problems: Asks Questions Perception: Coherent Willingness to Engage in Self-Management High Activities: Readiness to Engage in Self-Management High Activities: Electronic Signature(s) Signed: 07/02/2023 5:38:09 PM By: Karie Schwalbe RN Entered By: Karie Schwalbe on 07/02/2023 05:27:07 -------------------------------------------------------------------------------- Fall Risk Assessment Details Patient Name: Date of Service: Joan Officer, [name] 07/02/2023 8:00 Joan M Medical Record Number: [id number] Patient Account Number: [id number] Date of Birth/Sex: Treating  RN: April 08, 1966 (57 y.o. [name] Primary Care Teaghan Formica: PCP, [name]: Referring Bryttani Blew: Treating Analys Ryden/Extender: Sherryl Manges in Treatment: 0 Fall Risk Assessment Items Have you had 2 or more falls in the last 12 monthso 0 No Joan Joan ([id number]) [phone] [id number] Page 3 of 4 Have you had any fall that resulted in injury in the last 12 monthso 0 No FALLS RISK SCREEN History of falling - immediate or within 3 months 0 No Secondary diagnosis (Do you have 2 or more medical diagnoseso) 0 No Ambulatory  aid None/bed rest/wheelchair/nurse 0 No Crutches/cane/walker 15 Yes Furniture 0 No Intravenous therapy Access/Saline/Heparin Lock 0 No Gait/Transferring Normal/ bed rest/ wheelchair 0 No Weak (short steps with or without shuffle, stooped but able to lift head while walking, may seek 0 No support from furniture) Impaired (short steps with shuffle, may have difficulty arising from chair, head down, impaired 0 No balance) Mental Status Oriented to own ability 0 No Electronic Signature(s) Signed: 07/02/2023 5:38:09 PM By: Karie Schwalbe RN Entered By: Karie Schwalbe on 07/02/2023 05:28:41 -------------------------------------------------------------------------------- Foot Assessment Details Patient Name: Date of Service: Joan Officer, [name] 07/02/2023 8:00 Joan M Medical Record Number: [id number] Patient Account Number: [id number] Date of Birth/Sex: Treating RN: [birth year] (57 y.o. [name] Primary Care Joan Joan: PCP, [name]: Referring Joan Joan: Treating Joan Joan/Extender: Sherryl Manges in Treatment: 0 Foot Assessment Items Site Locations + = Sensation present, - = Sensation absent, C = Callus, U = Ulcer R = Redness, W = Warmth, M = Maceration, PU = Pre-ulcerative lesion F = Fissure, S = Swelling, D = Dryness Assessment Right: Left: Other Deformity: No No Prior Foot Ulcer: No No Prior  Amputation: No No Charcot Joint: No No Ambulatory Status: Ambulatory With Help Assistance Device: [name], [name] Joan ([id number]) [id number] Page 4 of 4 Gait: Steady Electronic Signature(s) Signed: 07/02/2023 5:38:09 PM By: Karie Schwalbe RN Entered By: Karie Schwalbe on 07/02/2023 05:29:50 -------------------------------------------------------------------------------- Nutrition Risk Screening Details Patient Name: Date of Service: Joan Joan, [place] RY Joan. 07/02/2023 8:00 Joan M Medical Record Number: [id number] Patient Account Number: [id number] Date of Birth/Sex: Treating RN: 1966-02-06 (57 y.o. [name] Primary Care Roye Gustafson: PCP, [name]: Referring Nnamdi Dacus: Treating Ryn Peine/Extender: Sherryl Manges in Treatment: 0 Height (in): 71 Weight (lbs): 352 Body Mass Index (BMI): 49.1 Nutrition Risk Screening Items Score Screening NUTRITION RISK SCREEN: I have an illness or condition that made me change the kind and/or amount of food I eat 0 No I eat fewer than two meals per day 0 No I eat few fruits and vegetables, or milk products 2 Yes I have three or more drinks of beer, liquor or wine almost every day 0 No I have tooth or mouth problems that make it hard for me to eat 0 No I don't always have enough money to buy the food I need 0 No I eat alone most of the time 0 No I take three or more different prescribed or over-the-counter drugs Joan day 0 No Without wanting to, I have lost or gained 10 pounds in the last six months 0 No I am not always physically able to shop, cook and/or feed myself 0 No Nutrition Protocols Good Risk Protocol 0 No interventions needed Moderate Risk Protocol High Risk Proctocol Risk Level: Good Risk Score: 2 Electronic Signature(s) Signed: 07/02/2023 5:38:09 PM By: Karie Schwalbe RN Entered By: Karie Schwalbe on 07/02/2023 05:29:20

## 2023-07-09 ENCOUNTER — Encounter (HOSPITAL_BASED_OUTPATIENT_CLINIC_OR_DEPARTMENT_OTHER): Payer: Medicare HMO | Admitting: General Surgery

## 2023-07-09 DIAGNOSIS — I110 Hypertensive heart disease with heart failure: Secondary | ICD-10-CM | POA: Diagnosis not present

## 2023-07-09 DIAGNOSIS — I5032 Chronic diastolic (congestive) heart failure: Secondary | ICD-10-CM | POA: Diagnosis not present

## 2023-07-09 DIAGNOSIS — L97812 Non-pressure chronic ulcer of other part of right lower leg with fat layer exposed: Secondary | ICD-10-CM | POA: Diagnosis not present

## 2023-07-09 DIAGNOSIS — L97822 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osed: Secondary | ICD-10-CM | POA: Diagnosis not present

## 2023-07-09 DIAGNOSIS — Z86718 Personal history of other venous thrombosis and embolism: Secondary | ICD-10-CM | POA: Diagnosis not present

## 2023-07-09 DIAGNOSIS — I890 Lymphedema, not elsewhere classified: Secondary | ICD-10-CM | POA: Diagnosis not present

## 2023-07-09 DIAGNOSIS — M1990 Unspecified osteoarthritis, unspecified site: Secondary | ICD-10-CM | POA: Diagnosis not present

## 2023-07-09 NOTE — Progress Notes (Signed)
EZZIE, SENAT Joan ([id number]) [id number] Page 1 of 11 Visit Report for 07/09/2023 Chief Complaint Document Details Patient Name: Date of Service: [name], [place] [name]. 07/09/2023 10:00 Joan M Medical Record Number: [id number] Patient Account Number: [id number] Date of Birth/Sex: Treating RN: 03/10/1966 (57 y.o. F) Primary Care Provider: PCP, [name]: Referring Provider: Treating Provider/Extender: Sherryl Manges in Treatment: 1 Information Obtained from: Patient Chief Complaint Patient presents for treatment of open ulcers due to lymphedema Electronic Signature(s) Signed: 07/09/2023 10:38:34 AM By: Duanne Guess MD FACS Entered By: Duanne Guess on 07/09/2023 07:38:34 -------------------------------------------------------------------------------- Debridement Details Patient Name: Date of Service: Joan Officer, [place] [name]. 07/09/2023 10:00 Joan M Medical Record Number: [id number] Patient Account Number: [id number] Date of Birth/Sex: Treating RN: Apr 14, 1966 (57 y.o. [name] Primary Care Provider: PCP, [name]: Referring Provider: Treating Provider/Extender: Sherryl Manges in Treatment: 1 Debridement Performed for Assessment: Wound #6 Left,Lateral Lower Leg Performed By: Physician Duanne Guess, MD The following information was scribed by: Samuella Bruin The information was scribed for: Duanne Guess Debridement Type: Debridement Level of Consciousness (Pre-procedure): Awake and Alert Pre-procedure Verification/Time Out Yes - 10:27 Taken: Start Time: 10:27 Pain Control: Lidocaine 4% T opical Solution Percent of Wound Bed Debrided: 100% T Area Debrided (cm): otal 2.12 Tissue and other material debrided: Non-Viable, Slough, Subcutaneous, Slough Level: Skin/Subcutaneous Tissue Debridement Description: Excisional Instrument: Curette Bleeding: Minimum Hemostasis Achieved: Pressure Response to Treatment:  Procedure was tolerated well Level of Consciousness (Post- Awake and Alert procedure): Post Debridement Measurements of Total Wound Length: (cm) 1.8 Width: (cm) 1.5 Depth: (cm) 0.2 Volume: (cm) 0.424 Character of Wound/Ulcer Post Debridement: Improved Post Procedure Diagnosis [name], [name] ([id number]) [id number].pdf Page 2 of 11 Same as Pre-procedure Electronic Signature(s) Signed: 07/09/2023 10:44:08 AM By: Duanne Guess MD FACS Signed: 07/09/2023 3:34:06 PM By: Gelene Mink By: Samuella Bruin on 07/09/2023 07:29:09 -------------------------------------------------------------------------------- Debridement Details Patient Name: Date of Service: Joan Officer, MA [name]. 07/09/2023 10:00 Joan M Medical Record Number: [id number] Patient Account Number: [id number] Date of Birth/Sex: Treating RN: July 27, 1966 (57 y.o. [name] Primary Care Provider: PCP, [name]: Referring Provider: Treating Provider/Extender: Sherryl Manges in Treatment: 1 Debridement Performed for Assessment: Wound #3 Left,Proximal,Medial Lower Leg Performed By: Physician Duanne Guess, MD The following information was scribed by: Samuella Bruin The information was scribed for: Duanne Guess Debridement Type: Debridement Level of Consciousness (Pre-procedure): Awake and Alert Pre-procedure Verification/Time Out Yes - 10:27 Taken: Start Time: 10:27 Pain Control: Lidocaine 4% T opical Solution Percent of Wound Bed Debrided: 100% T Area Debrided (cm): otal 2.01 Tissue and other material debrided: Non-Viable, Slough, Subcutaneous, Slough Level: Skin/Subcutaneous Tissue Debridement Description: Excisional Instrument: Curette Bleeding: Minimum Hemostasis Achieved: Pressure Response to Treatment: Procedure was tolerated well Level of Consciousness (Post- Awake and Alert procedure): Post Debridement Measurements of Total Wound Length:  (cm) 1.6 Width: (cm) 1.6 Depth: (cm) 0.2 Volume: (cm) 0.402 Character of Wound/Ulcer Post Debridement: Improved Post Procedure Diagnosis Same as Pre-procedure Electronic Signature(s) Signed: 07/09/2023 10:44:08 AM By: Duanne Guess MD FACS Signed: 07/09/2023 3:34:06 PM By: Samuella Bruin Entered By: Samuella Bruin on 07/09/2023 07:29:45 -------------------------------------------------------------------------------- Debridement Details Patient Name: Date of Service: Joan Officer, MA [name]. 07/09/2023 10:00 Joan M Medical Record Number: [id number] Patient Account Number: [id number] Date of Birth/Sex: Treating RN: 1966/08/02 (57 y.o. [name] Primary Care Provider: PCP, [name]: SHANTERIA, [name] ([id number]) [id number] Page 3 of 11 Referring Provider: Treating Provider/Extender: Duanne Guess  Weeks in Treatment: 1 Debridement Performed for Assessment: Wound #2 Right,Lateral Lower Leg Performed By: Physician Duanne Guess, MD The following information was scribed by: Samuella Bruin The information was scribed for: Duanne Guess Debridement Type: Debridement Level of Consciousness (Pre-procedure): Awake and Alert Pre-procedure Verification/Time Out Yes - 10:27 Taken: Start Time: 10:27 Pain Control: Lidocaine 4% T opical Solution Percent of Wound Bed Debrided: 100% T Area Debrided (cm): otal 1.26 Tissue and other material debrided: Non-Viable, Slough, Slough Level: Non-Viable Tissue Debridement Description: Selective/Open Wound Instrument: Curette Bleeding: Minimum Hemostasis Achieved: Pressure Response to Treatment: Procedure was tolerated well Level of Consciousness (Post- Awake and Alert procedure): Post Debridement Measurements of Total Wound Length: (cm) 3.2 Width: (cm) 0.5 Depth: (cm) 0.1 Volume: (cm) 0.126 Character of Wound/Ulcer Post Debridement: Improved Post Procedure Diagnosis Same as  Pre-procedure Electronic Signature(s) Signed: 07/09/2023 10:44:08 AM By: Duanne Guess MD FACS Signed: 07/09/2023 3:34:06 PM By: Samuella Bruin Entered By: Samuella Bruin on 07/09/2023 07:30:12 -------------------------------------------------------------------------------- HPI Details Patient Name: Date of Service: Joan Officer, MA [name]. 07/09/2023 10:00 Joan M Medical Record Number: [id number] Patient Account Number: [id number] Date of Birth/Sex: Treating RN: 1966-07-25 (57 y.o. F) Primary Care Provider: PCP, [name]: Referring Provider: Treating Provider/Extender: Sherryl Manges in Treatment: 1 History of Present Illness HPI Description: ADMISSION 07/02/2023 ***ABIs non-compressible bilaterally; triphasic Doppler signals*** This is Joan 57 year old morbidly obese patient who is not Joan diabetic. She does have hypertension and CHF. Although she does not carry this diagnosis in her electronic medical record, she clearly has lymphedema. She has multiple wounds on her bilateral lower extremities, 1 of which has been present for 3 years. She has been treating her wounds with peroxide and bacitracin. Her provider has given her various courses of antibiotics, but the wounds have not closed. She was told not to wear compression stockings due to her history of DVT but this was well over Joan year ago. She has never worn lymphedema pumps. , 07/09/2023: Several of the smaller wounds have closed. Remaining wounds include the left proximal medial ulcer and the left lateral distal ulcer, as well as the paired wounds on her right lateral lower leg. There is slough accumulation at all sites. Electronic Signature(s) Signed: 07/09/2023 10:39:50 AM By: Duanne Guess MD FACS Entered By: Duanne Guess on 07/09/2023 07:39:49 [name] ([id number]) [id number] Page 4 of  11 -------------------------------------------------------------------------------- Physical Exam Details Patient Name: Date of Service: [name], [place] [name]. 07/09/2023 10:00 Joan M Medical Record Number: [id number] Patient Account Number: [id number] Date of Birth/Sex: Treating RN: 1966/04/23 (57 y.o. F) Primary Care Provider: PCP, [name]: Referring Provider: Treating Provider/Extender: Sherryl Manges in Treatment: 1 Constitutional Hypertensive, asymptomatic. Tachycardic, asymptomatic. . . no acute distress. Respiratory Normal work of breathing on room air. Notes 07/09/2023: Several of the smaller wounds have closed. Remaining wounds include the left proximal medial ulcer and the left lateral distal ulcer, as well as the paired wounds on her right lateral lower leg. There is slough accumulation at all sites. Electronic Signature(s) Signed: 07/09/2023 10:40:24 AM By: Duanne Guess MD FACS Entered By: Duanne Guess on 07/09/2023 07:40:23 -------------------------------------------------------------------------------- Physician Orders Details Patient Name: Date of Service: Joan Officer, MA [name]. 07/09/2023 10:00 Joan M Medical Record Number: [id number] Patient Account Number: [id number] Date of Birth/Sex: Treating RN: 1966-02-15 (57 y.o. [name] Primary Care Provider: PCP, [name]: Referring Provider: Treating Provider/Extender: Sherryl Manges in Treatment: 1 The following information was scribed by: Ander Slade,  Ladona Ridgel The information was scribed for: Duanne Guess Verbal / Phone Orders: No Diagnosis Coding ICD-10 Coding Code Description [phone] Non-pressure chronic ulcer of other part of right lower leg with fat layer exposed L97.822 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osed I89.0 Lymphedema, not elsewhere classified I50.32 Chronic diastolic (congestive) heart failure E66.01 Morbid (severe)  obesity due to excess calories Follow-up Appointments ppointment in 1 week. - Dr. Lady Gary Room 3 Return Joan Anesthetic (In clinic) Topical Lidocaine 4% applied to wound bed Bathing/ Shower/ Hygiene May shower with protection but do not get wound dressing(s) wet. Protect dressing(s) with water repellant cover (for example, large plastic bag) or Joan cast cover and may then take shower. - Keep legs dry until next appointment. Use Cast Protectors if so desired. Other Bathing/Shower/Hygiene Orders/Instructions: - May want to sponge bath if legs cannot be kept dry until next appointment. Edema Control - Lymphedema / SCD / Other Bilateral Lower Extremities [name], [name] ([id number]) [id number] Page 5 of 11 Elevate legs to the level of the heart or above for 30 minutes daily and/or when sitting for 3-4 times Joan day throughout the day. Avoid standing for long periods of time. Exercise regularly - As tolerated Moisturize legs daily. Additional Orders / Instructions Follow Nutritious Diet - Try and increase Protein intake. The goal is 70g-100g per day. Examples are Chicken,fish.meat,pork, [place] yogurt ,eggs etc. Wound Treatment Wound #2 - Lower Leg Wound Laterality: Right, Lateral Cleanser: Soap and Water 1 x Per Week/30 Days Discharge Instructions: At Wound center-May shower and wash wound with dial antibacterial soap and water prior to dressing change. Peri-Wound Care: Triamcinolone 15 (g) 1 x Per Week/30 Days Discharge Instructions: Use triamcinolone 15 (g) as directed Peri-Wound Care: Sween Lotion (Moisturizing lotion) 1 x Per Week/30 Days Discharge Instructions: Apply moisturizing lotion as directed Prim Dressing: Hydrofera Blue Ready Transfer Foam, 4x5 (in/in) 1 x Per Week/30 Days ary Discharge Instructions: Apply to wound bed as instructed Secondary Dressing: Woven Gauze Sponge, Non-Sterile 4x4 in 1 x Per Week/30 Days Discharge Instructions: Apply over primary  dressing as directed. Compression Wrap: Unnaboot w/Calamine, 4x10 (in/yd) 1 x Per Week/30 Days Discharge Instructions: Apply Unnaboot as directed. Wound #3 - Lower Leg Wound Laterality: Left, Medial, Proximal Cleanser: Soap and Water 1 x Per Week/30 Days Discharge Instructions: At Wound center-May shower and wash wound with dial antibacterial soap and water prior to dressing change. Peri-Wound Care: Triamcinolone 15 (g) 1 x Per Week/30 Days Discharge Instructions: Use triamcinolone 15 (g) as directed Peri-Wound Care: Sween Lotion (Moisturizing lotion) 1 x Per Week/30 Days Discharge Instructions: Apply moisturizing lotion as directed Prim Dressing: Hydrofera Blue Ready Transfer Foam, 4x5 (in/in) 1 x Per Week/30 Days ary Discharge Instructions: Apply to wound bed as instructed Secondary Dressing: Woven Gauze Sponge, Non-Sterile 4x4 in 1 x Per Week/30 Days Discharge Instructions: Apply over primary dressing as directed. Compression Wrap: Unnaboot w/Calamine, 4x10 (in/yd) 1 x Per Week/30 Days Discharge Instructions: Apply Unnaboot as directed. Wound #6 - Lower Leg Wound Laterality: Left, Lateral Cleanser: Soap and Water 1 x Per Week/30 Days Discharge Instructions: At Wound center-May shower and wash wound with dial antibacterial soap and water prior to dressing change. Peri-Wound Care: Triamcinolone 15 (g) 1 x Per Week/30 Days Discharge Instructions: Use triamcinolone 15 (g) as directed Peri-Wound Care: Sween Lotion (Moisturizing lotion) 1 x Per Week/30 Days Discharge Instructions: Apply moisturizing lotion as directed Prim Dressing: Hydrofera Blue Ready Transfer Foam, 4x5 (in/in) 1 x Per  Week/30 Days ary Discharge Instructions: Apply to wound bed as instructed Secondary Dressing: Woven Gauze Sponge, Non-Sterile 4x4 in 1 x Per Week/30 Days Discharge Instructions: Apply over primary dressing as directed. Compression Wrap: Unnaboot w/Calamine, 4x10 (in/yd) 1 x Per Week/30 Days Discharge  Instructions: Apply Unnaboot as directed. Patient Medications llergies: No Known Allergies Joan Notifications Medication Indication Start End 07/09/2023 lidocaine DOSE topical 4 % cream - cream topical [name], [name] ([id number]) [phone].pdf Page 6 of 11 Electronic Signature(s) Signed: 07/09/2023 10:44:08 AM By: Duanne Guess MD FACS Entered By: Duanne Guess on 07/09/2023 07:40:42 -------------------------------------------------------------------------------- Problem List Details Patient Name: Date of Service: Joan Officer, MA [name]. 07/09/2023 10:00 Joan M Medical Record Number: [id number] Patient Account Number: [id number] Date of Birth/Sex: Treating RN: 10-27-1965 (56 y.o. F) Primary Care Provider: PCP, [name]: Referring Provider: Treating Provider/Extender: Sherryl Manges in Treatment: 1 Active Problems ICD-10 Encounter Code Description Active Date MDM Diagnosis L97.812 Non-pressure chronic ulcer of other part of right lower leg with fat layer 07/02/2023 No Yes exposed L97.822 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osed10/03/2023 No Yes I89.0 Lymphedema, not elsewhere classified 07/02/2023 No Yes I50.32 Chronic diastolic (congestive) heart failure 07/02/2023 No Yes E66.01 Morbid (severe) obesity due to excess calories 07/02/2023 No Yes Inactive Problems Resolved Problems Electronic Signature(s) Signed: 07/09/2023 10:38:04 AM By: Duanne Guess MD FACS Entered By: Duanne Guess on 07/09/2023 07:38:04 -------------------------------------------------------------------------------- Progress Note Details Patient Name: Date of Service: Joan Officer, MA [name]. 07/09/2023 10:00 Joan M Medical Record Number: [id number] Patient Account Number: [id number] Date of Birth/Sex: Treating RN: Apr 01, 1966 (57 y.o. F) Primary Care Provider: PCP, [name]: Referring Provider: Treating Provider/Extender:  Sherryl Manges in Treatment: 1 [name], [name] ([id number]) [id number] Page 7 of 11 Subjective Chief Complaint Information obtained from Patient Patient presents for treatment of open ulcers due to lymphedema History of Present Illness (HPI) ADMISSION 07/02/2023 ***ABIs non-compressible bilaterally; triphasic Doppler signals*** This is Joan 57 year old morbidly obese patient who is not Joan diabetic. She does have hypertension and CHF. Although she does not carry this diagnosis in her electronic medical record, she clearly has lymphedema. She has multiple wounds on her bilateral lower extremities, 1 of which has been present for 3 years. She has been treating her wounds with peroxide and bacitracin. Her provider has given her various courses of antibiotics, but the wounds have not closed. She was told not to wear compression stockings due to her history of DVT but this was well over Joan year ago. She has never worn lymphedema pumps. , 07/09/2023: Several of the smaller wounds have closed. Remaining wounds include the left proximal medial ulcer and the left lateral distal ulcer, as well as the paired wounds on her right lateral lower leg. There is slough accumulation at all sites. Patient History Information obtained from Patient. Family History Unknown History. Social History Never smoker, Marital Status - Divorced, Alcohol Use - Rarely, Drug Use - No History, Caffeine Use - Daily - Tea,Coffee ,Soda. Medical History Hematologic/Lymphatic Patient has history of Anemia Respiratory Patient has history of Asthma Cardiovascular Patient has history of Congestive Heart Failure, Deep Vein Thrombosis, Hypertension Musculoskeletal Patient has history of Osteoarthritis Hospitalization/Surgery History - Tubal Ligation. Medical Joan Surgical History Notes nd Cardiovascular Morbidly Obese Objective Constitutional Hypertensive, asymptomatic. Tachycardic, asymptomatic.  no acute distress. Vitals Time Taken: 10:03 AM, Height: 71 in, Weight: 352 lbs, BMI: 49.1, Temperature: 98.3 F, Pulse: 123 bpm, Respiratory Rate: 18 breaths/min,  Blood Pressure: 215/98 mmHg. Respiratory Normal work of breathing on room air. General Notes: 07/09/2023: Several of the smaller wounds have closed. Remaining wounds include the left proximal medial ulcer and the left lateral distal ulcer, as well as the paired wounds on her right lateral lower leg. There is slough accumulation at all sites. Integumentary (Hair, Skin) Wound #1 status is Open. Original cause of wound was Gradually Appeared. The date acquired was: 04/01/2023. The wound has been in treatment 1 weeks. The wound is located on the Right,Anterior Lower Leg. The wound measures 0cm length x 0cm width x 0cm depth; 0cm^2 area and 0cm^3 volume. There is no tunneling or undermining noted. There is Joan none present amount of drainage noted. The wound margin is flat and intact. There is no granulation within the wound bed. There is no necrotic tissue within the wound bed. The periwound skin appearance had no abnormalities noted for moisture. The periwound skin appearance exhibited: Scarring, Hemosiderin Staining. Periwound temperature was noted as No Abnormality. Wound #2 status is Open. Original cause of wound was Gradually Appeared. The date acquired was: 07/01/2021. The wound has been in treatment 1 weeks. The wound is located on the Right,Lateral Lower Leg. The wound measures 3.2cm length x 0.5cm width x 0.1cm depth; 1.257cm^2 area and 0.126cm^3 volume. There is Fat Layer (Subcutaneous Tissue) exposed. There is no tunneling or undermining noted. There is Joan medium amount of serosanguineous drainage noted. The wound margin is distinct with the outline attached to the wound base. There is large (67-100%) red granulation within the wound bed. There is Joan small (1-33%) amount of necrotic tissue within the wound bed including Adherent Slough. The  periwound skin appearance had no abnormalities noted for texture. The periwound skin appearance had no abnormalities noted for moisture. The periwound skin appearance exhibited: Hemosiderin Staining. Periwound temperature was noted as No Abnormality. Wound #3 status is Open. Original cause of wound was Gradually Appeared. The date acquired was: 04/01/2023. The wound has been in treatment 1 weeks. The wound is located on the Left,Proximal,Medial Lower Leg. The wound measures 1.6cm length x 1.6cm width x 0.2cm depth; 2.011cm^2 area and 0.402cm^3 volume. There is Fat Layer (Subcutaneous Tissue) exposed. There is no tunneling or undermining noted. There is Joan medium amount of serosanguineous drainage noted. The wound margin is distinct with the outline attached to the wound base. There is large (67-100%) red granulation within the wound bed. There is [name], [name] ([id number]) [id number] Page 8 of 11 Joan small (1-33%) amount of necrotic tissue within the wound bed including Adherent Slough. The periwound skin appearance had no abnormalities noted for texture. The periwound skin appearance had no abnormalities noted for moisture. The periwound skin appearance exhibited: Hemosiderin Staining. Periwound temperature was noted as No Abnormality. Wound #4 status is Healed - Epithelialized. Original cause of wound was Gradually Appeared. The date acquired was: 06/18/2023. The wound has been in treatment 1 weeks. The wound is located on the Left,Distal,Medial Lower Leg. The wound measures 0cm length x 0cm width x 0cm depth; 0cm^2 area and 0cm^3 volume. There is no tunneling or undermining noted. There is Joan none present amount of drainage noted. The wound margin is distinct with the outline attached to the wound base. There is no granulation within the wound bed. There is no necrotic tissue within the wound bed. The periwound skin appearance had no abnormalities noted for texture. The  periwound skin appearance had no abnormalities noted for moisture. The periwound skin appearance exhibited: Hemosiderin  Staining. Periwound temperature was noted as No Abnormality. Wound #5 status is Healed - Epithelialized. Original cause of wound was Gradually Appeared. The date acquired was: 06/19/2023. The wound has been in treatment 1 weeks. The wound is located on the Left,Anterior Lower Leg. The wound measures 0cm length x 0cm width x 0cm depth; 0cm^2 area and 0cm^3 volume. There is no tunneling or undermining noted. There is Joan none present amount of drainage noted. The wound margin is distinct with the outline attached to the wound base. There is no granulation within the wound bed. There is no necrotic tissue within the wound bed. The periwound skin appearance had no abnormalities noted for texture. The periwound skin appearance had no abnormalities noted for moisture. The periwound skin appearance exhibited: Hemosiderin Staining. Periwound temperature was noted as No Abnormality. Wound #6 status is Open. Original cause of wound was Gradually Appeared. The date acquired was: 08/09/2020. The wound has been in treatment 1 weeks. The wound is located on the Left,Lateral Lower Leg. The wound measures 1.8cm length x 1.5cm width x 0.2cm depth; 2.121cm^2 area and 0.424cm^3 volume. There is Fat Layer (Subcutaneous Tissue) exposed. There is no tunneling or undermining noted. There is Joan medium amount of serosanguineous drainage noted. The wound margin is distinct with the outline attached to the wound base. There is medium (34-66%) red granulation within the wound bed. There is Joan medium (34- 66%) amount of necrotic tissue within the wound bed including Adherent Slough. The periwound skin appearance had no abnormalities noted for texture. The periwound skin appearance had no abnormalities noted for moisture. The periwound skin appearance exhibited: Hemosiderin Staining. Periwound temperature was noted as  No Abnormality. Assessment Active Problems ICD-10 Non-pressure chronic ulcer of other part of right lower leg with fat layer exposed 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osed Lymphedema, not elsewhere classified Chronic diastolic (congestive) heart failure Morbid (severe) obesity due to excess calories Procedures Wound #2 Pre-procedure diagnosis of Wound #2 is Joan Lymphedema located on the Right,Lateral Lower Leg . There was Joan Selective/Open Wound Non-Viable Tissue Debridement with Joan total area of 1.26 sq cm performed by Duanne Guess, MD. With the following instrument(s): Curette to remove Non-Viable tissue/material. Material removed includes [name] after achieving pain control using Lidocaine 4% Topical Solution. No specimens were taken. Joan time out was conducted at 10:27, prior to the start of the procedure. Joan Minimum amount of bleeding was controlled with Pressure. The procedure was tolerated well. Post Debridement Measurements: 3.2cm length x 0.5cm width x 0.1cm depth; 0.126cm^3 volume. Character of Wound/Ulcer Post Debridement is improved. Post procedure Diagnosis Wound #2: Same as Pre-Procedure Pre-procedure diagnosis of Wound #2 is Joan Lymphedema located on the Right,Lateral Lower Leg . There was Joan Three Layer Compression Therapy Procedure by Samuella Bruin, RN. Post procedure Diagnosis Wound #2: Same as Pre-Procedure Wound #3 Pre-procedure diagnosis of Wound #3 is Joan Lymphedema located on the Left,Proximal,Medial Lower Leg . There was Joan Excisional Skin/Subcutaneous Tissue Debridement with Joan total area of 2.01 sq cm performed by Duanne Guess, MD. With the following instrument(s): Curette to remove Non-Viable tissue/material. Material removed includes Subcutaneous Tissue and Slough and after achieving pain control using Lidocaine 4% T opical Solution. No specimens were taken. Joan time out was conducted at 10:27, prior to the start of the  procedure. Joan Minimum amount of bleeding was controlled with Pressure. The procedure was tolerated well. Post Debridement Measurements: 1.6cm length x 1.6cm width x 0.2cm depth;  0.402cm^3 volume. Character of Wound/Ulcer Post Debridement is improved. Post procedure Diagnosis Wound #3: Same as Pre-Procedure Pre-procedure diagnosis of Wound #3 is Joan Lymphedema located on the Left,Proximal,Medial Lower Leg . There was Joan Three Layer Compression Therapy Procedure by Samuella Bruin, RN. Post procedure Diagnosis Wound #3: Same as Pre-Procedure Wound #6 Pre-procedure diagnosis of Wound #6 is Joan Lymphedema located on the Left,Lateral Lower Leg . There was Joan Excisional Skin/Subcutaneous Tissue Debridement with Joan total area of 2.12 sq cm performed by Duanne Guess, MD. With the following instrument(s): Curette to remove Non-Viable tissue/material. Material removed includes Subcutaneous Tissue and Slough and after achieving pain control using Lidocaine 4% T opical Solution. No specimens were taken. Joan time out was conducted at 10:27, prior to the start of the procedure. Joan Minimum amount of bleeding was controlled with Pressure. The procedure was tolerated well. Post Debridement Measurements: 1.8cm length x 1.5cm width x 0.2cm depth; 0.424cm^3 volume. Character of Wound/Ulcer Post Debridement is improved. Post procedure Diagnosis Wound #6: Same as Pre-Procedure [name], [name] ([id number]) [id number].pdf Page 9 of 11 Plan Follow-up Appointments: Return Appointment in 1 week. - Dr. Lady Gary Room 3 Anesthetic: (In clinic) Topical Lidocaine 4% applied to wound bed Bathing/ Shower/ Hygiene: May shower with protection but do not get wound dressing(s) wet. Protect dressing(s) with water repellant cover (for example, large plastic bag) or Joan cast cover and may then take shower. - Keep legs dry until next appointment. Use Cast Protectors if so desired. Other Bathing/Shower/Hygiene  Orders/Instructions: - May want to sponge bath if legs cannot be kept dry until next appointment. Edema Control - Lymphedema / SCD / Other: Elevate legs to the level of the heart or above for 30 minutes daily and/or when sitting for 3-4 times Joan day throughout the day. Avoid standing for long periods of time. Exercise regularly - As tolerated Moisturize legs daily. Additional Orders / Instructions: Follow Nutritious Diet - Try and increase Protein intake. The goal is 70g-100g per day. Examples are Chicken,fish.meat,pork, [place] yogurt ,eggs etc. The following medication(s) was prescribed: lidocaine topical 4 % cream cream topical was prescribed at facility WOUND #2: - Lower Leg Wound Laterality: Right, Lateral Cleanser: Soap and Water 1 x Per Week/30 Days Discharge Instructions: At Wound center-May shower and wash wound with dial antibacterial soap and water prior to dressing change. Peri-Wound Care: Triamcinolone 15 (g) 1 x Per Week/30 Days Discharge Instructions: Use triamcinolone 15 (g) as directed Peri-Wound Care: Sween Lotion (Moisturizing lotion) 1 x Per Week/30 Days Discharge Instructions: Apply moisturizing lotion as directed Prim Dressing: Hydrofera Blue Ready Transfer Foam, 4x5 (in/in) 1 x Per Week/30 Days ary Discharge Instructions: Apply to wound bed as instructed Secondary Dressing: Woven Gauze Sponge, Non-Sterile 4x4 in 1 x Per Week/30 Days Discharge Instructions: Apply over primary dressing as directed. Com pression Wrap: Unnaboot w/Calamine, 4x10 (in/yd) 1 x Per Week/30 Days Discharge Instructions: Apply Unnaboot as directed. WOUND #3: - Lower Leg Wound Laterality: Left, Medial, Proximal Cleanser: Soap and Water 1 x Per Week/30 Days Discharge Instructions: At Wound center-May shower and wash wound with dial antibacterial soap and water prior to dressing change. Peri-Wound Care: Triamcinolone 15 (g) 1 x Per Week/30 Days Discharge Instructions: Use triamcinolone 15 (g) as  directed Peri-Wound Care: Sween Lotion (Moisturizing lotion) 1 x Per Week/30 Days Discharge Instructions: Apply moisturizing lotion as directed Prim Dressing: Hydrofera Blue Ready Transfer Foam, 4x5 (in/in) 1 x Per Week/30 Days ary Discharge Instructions: Apply to wound bed as instructed Secondary Dressing: Woven  Gauze Sponge, Non-Sterile 4x4 in 1 x Per Week/30 Days Discharge Instructions: Apply over primary dressing as directed. Com pression Wrap: Unnaboot w/Calamine, 4x10 (in/yd) 1 x Per Week/30 Days Discharge Instructions: Apply Unnaboot as directed. WOUND #6: - Lower Leg Wound Laterality: Left, Lateral Cleanser: Soap and Water 1 x Per Week/30 Days Discharge Instructions: At Wound center-May shower and wash wound with dial antibacterial soap and water prior to dressing change. Peri-Wound Care: Triamcinolone 15 (g) 1 x Per Week/30 Days Discharge Instructions: Use triamcinolone 15 (g) as directed Peri-Wound Care: Sween Lotion (Moisturizing lotion) 1 x Per Week/30 Days Discharge Instructions: Apply moisturizing lotion as directed Prim Dressing: Hydrofera Blue Ready Transfer Foam, 4x5 (in/in) 1 x Per Week/30 Days ary Discharge Instructions: Apply to wound bed as instructed Secondary Dressing: Woven Gauze Sponge, Non-Sterile 4x4 in 1 x Per Week/30 Days Discharge Instructions: Apply over primary dressing as directed. Com pression Wrap: Unnaboot w/Calamine, 4x10 (in/yd) 1 x Per Week/30 Days Discharge Instructions: Apply Unnaboot as directed. 07/09/2023: Several of the smaller wounds have closed. Remaining wounds include the left proximal medial ulcer and the left lateral distal ulcer, as well as the paired wounds on her right lateral lower leg. There is slough accumulation at all sites. I used Joan curette to debride slough and subcutaneous tissue from both of the left leg wounds and slough from the right leg wounds. We will continue Hydrofera Blue ready to all sites with bilateral calamine-based  Unna boot. We are applying triamcinolone to the periwound to help with itching. We are also going to order some Farrow wraps for use once her legs are healed. Electronic Signature(s) Signed: 07/09/2023 10:41:29 AM By: Duanne Guess MD FACS Entered By: Duanne Guess on 07/09/2023 07:41:28 HxROS Details -------------------------------------------------------------------------------- [name] ([id number]) [id number].pdf Page 10 of 11 Patient Name: Date of Service: [name], [place] [name]. 07/09/2023 10:00 Joan M Medical Record Number: [id number] Patient Account Number: [id number] Date of Birth/Sex: Treating RN: 1966-07-01 (57 y.o. F) Primary Care Provider: PCP, [name]: Referring Provider: Treating Provider/Extender: Sherryl Manges in Treatment: 1 Information Obtained From Patient Hematologic/Lymphatic Medical History: Positive for: Anemia Respiratory Medical History: Positive for: Asthma Cardiovascular Medical History: Positive for: Congestive Heart Failure; Deep Vein Thrombosis; Hypertension Past Medical History Notes: Morbidly Obese Musculoskeletal Medical History: Positive for: Osteoarthritis Immunizations Pneumococcal Vaccine: Received Pneumococcal Vaccination: No Implantable Devices None Hospitalization / Surgery History Type of Hospitalization/Surgery Tubal Ligation Family and Social History Unknown History: Yes; Never smoker; Marital Status - Divorced; Alcohol Use: Rarely; Drug Use: No History; Caffeine Use: Daily - Tea,Coffee ,Soda; Financial Concerns: No; Food, Clothing or Shelter Needs: No; Support System Lacking: No; Transportation Concerns: No Electronic Signature(s) Signed: 07/09/2023 10:44:08 AM By: Duanne Guess MD FACS Entered By: Duanne Guess on 07/09/2023 07:39:58 -------------------------------------------------------------------------------- SuperBill Details Patient Name: Date of Service: Joan Officer,  [place] [name]. 07/09/2023 Medical Record Number: [id number] Patient Account Number: [id number] Date of Birth/Sex: Treating RN: May 24, 1966 (57 y.o. F) Primary Care Provider: PCP, [name]: Referring Provider: Treating Provider/Extender: Sherryl Manges in Treatment: 1 Diagnosis Coding ICD-10 Codes Code Description [phone] Non-pressure chronic ulcer of other part of right lower leg with fat layer exposed CLYDELL, ALBERTS Joan ([id number]) [id number].pdf Page 11 of 11 [id number] 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osed I89.0 Lymphedema, not elsewhere classified I50.32 Chronic diastolic (congestive) heart failure E66.01 Morbid (severe) obesity due to excess calories Facility Procedures : CPT4 Code: [id number] Description: 11042 - DEB  SUBQ TISSUE 20 SQ CM/< ICD-10 Diagnosis Description L97.822 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ose Modifier: d Quantity: 1 : CPT4 Code: [id number] Description: 97597 - DEBRIDE WOUND 1ST 20 SQ CM OR < ICD-10 Diagnosis Description L97.812 Non-pressure chronic ulcer of other part of right lower leg with fat layer expos Modifier: ed Quantity: 1 Physician Procedures : CPT4 Code Description Modifier [id number] 99213 - WC PHYS LEVEL 3 - EST PT 25 ICD-10 Diagnosis Description L97.812 Non-pressure chronic ulcer of other part of right lower leg with fat layer exposed L97.822 Non-pressure chronic ulcer of other part of left  lower leg with fat layer exposed I89.0 Lymphedema, not elsewhere classified I50.32 Chronic diastolic (congestive) heart failure Quantity: 1 : [id number] 11042 - WC PHYS SUBQ TISS 20 SQ CM ICD-10 Diagnosis Description L97.822 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osed Quantity: 1 : [id number] 97597 - WC PHYS DEBR WO ANESTH 20 SQ CM ICD-10 Diagnosis Description L97.812 Non-pressure chronic ulcer of other part of right  lower leg with fat layer exposed Quantity: 1 Electronic Signature(s) Signed: 07/09/2023 10:41:50 AM By: Duanne Guess MD FACS Entered By: Duanne Guess on 07/09/2023 07:41:49

## 2023-07-09 NOTE — Progress Notes (Signed)
LORALEI, RADCLIFFE A ([id number]) [id number] Page 1 of 14 Visit Report for 07/09/2023 Arrival Information Details Patient Name: Date of Service: [name], [name] 07/09/2023 10:00 A M Medical Record Number: [id number] Patient Account Number: [id number] Date of Birth/Sex: Treating RN: 01/13/1966 (57 y.o. Fredderick Phenix Primary Care Nolene Rocks: PCP, [name]: Referring Gerda Yin: Treating Bonne Whack/Extender: Sherryl Manges in Treatment: 1 Visit Information History Since Last Visit Added or deleted any medications: No Patient Arrived: Cane Any new allergies or adverse reactions: No Arrival Time: 10:00 Had a fall or experienced change in No Accompanied By: self activities of daily living that may affect Transfer Assistance: None risk of falls: Patient Identification Verified: Yes Signs or symptoms of abuse/neglect since last visito No Secondary Verification Process Completed: Yes Hospitalized since last visit: No Patient Has Alerts: Yes Implantable device outside of the clinic excluding No Patient Alerts: Lasix cellular tissue based products placed in the center ABI R Peter (07/02/23) since last visit: ABI L Hubbard (07/02/23) Has Dressing in Place as Prescribed: Yes Has Compression in Place as Prescribed: Yes Pain Present Now: No Electronic Signature(s) Signed: 07/09/2023 3:34:06 PM By: Samuella Bruin Entered By: Samuella Bruin on 07/09/2023 07:02:54 -------------------------------------------------------------------------------- Compression Therapy Details Patient Name: Date of Service: [name], [name] 07/09/2023 10:00 A M Medical Record Number: [id number] Patient Account Number: [id number] Date of Birth/Sex: Treating RN: [birth year] (57 y.o. Fredderick Phenix Primary Care Libby Goehring: PCP, [name]: Referring Allard Lightsey: Treating Kamaiyah Uselton/Extender: Sherryl Manges in Treatment: 1 Compression Therapy Performed for  Wound Assessment: Wound #2 Right,Lateral Lower Leg Performed By: Clinician Samuella Bruin, RN Compression Type: Three Layer Post Procedure Diagnosis Same as Pre-procedure Electronic Signature(s) Signed: 07/09/2023 3:34:06 PM By: Samuella Bruin Entered By: Samuella Bruin on 07/09/2023 07:30:28 [name] ([id number]) [id number] Page 2 of 14 -------------------------------------------------------------------------------- Compression Therapy Details Patient Name: Date of Service: [name], [name] 07/09/2023 10:00 A M Medical Record Number: [id number] Patient Account Number: [id number] Date of Birth/Sex: Treating RN: [birth year] (57 y.o. Fredderick Phenix Primary Care Azaela Caracci: PCP, [name]: Referring Tome Wilson: Treating Amalie Koran/Extender: Sherryl Manges in Treatment: 1 Compression Therapy Performed for Wound Assessment: Wound #3 Left,Proximal,Medial Lower Leg Performed By: Clinician Samuella Bruin, RN Compression Type: Three Layer Post Procedure Diagnosis Same as Pre-procedure Electronic Signature(s) Signed: 07/09/2023 3:34:06 PM By: Samuella Bruin Entered By: Samuella Bruin on 07/09/2023 07:30:28 -------------------------------------------------------------------------------- Encounter Discharge Information Details Patient Name: Date of Service: [name], [name] 07/09/2023 10:00 A M Medical Record Number: [id number] Patient Account Number: [id number] Date of Birth/Sex: Treating RN: November 16, 1965 (57 y.o. Fredderick Phenix Primary Care Nashea Chumney: PCP, [name]: Referring Jenet Durio: Treating Breana Litts/Extender: Sherryl Manges in Treatment: 1 Encounter Discharge Information Items Post Procedure Vitals Discharge Condition: Stable Temperature (F): 98.3 Ambulatory Status: Cane Pulse (bpm): 123 Discharge Destination: Home Respiratory Rate (breaths/min): 18 Transportation: Private  Auto Blood Pressure (mmHg): 215/98 Accompanied By: self Schedule Follow-up Appointment: Yes Clinical Summary of Care: Patient Declined Electronic Signature(s) Signed: 07/09/2023 3:34:06 PM By: Samuella Bruin Entered By: Samuella Bruin on 07/09/2023 08:17:51 -------------------------------------------------------------------------------- Lower Extremity Assessment Details Patient Name: Date of Service: [name], [name] 07/09/2023 10:00 A M Medical Record Number: [id number] Patient Account Number: [id number] Date of Birth/Sex: Treating RN: [birth year] (57 y.o. Fredderick Phenix Primary Care Jaella Weinert: PCP, [name]: Referring Joycie Aerts: Treating Ranesha Val/Extender: Sherryl Manges in Treatment: 1 Edema Assessment Assessed: [Left: No] [Right: No] [Left: Edema] [Right: :] Calf  LEOCADIA, IDLEMAN A ([id number]) [id number] Page 3 of 14 Left: Right: Point of Measurement: 32 cm From Medial Instep 47 cm 57 cm Ankle Left: Right: Point of Measurement: 10 cm From Medial Instep 29 cm 29 cm Vascular Assessment Pulses: Dorsalis Pedis Palpable: [Left:Yes] [Right:Yes] Extremity colors, hair growth, and conditions: Extremity Color: [Left:Hyperpigmented] [Right:Hyperpigmented] Hair Growth on Extremity: [Left:No] [Right:No] Temperature of Extremity: [Left:Warm] [Right:Warm] Capillary Refill: [Left:< 3 seconds] [Right:< 3 seconds] Dependent Rubor: [Left:No No] [Right:No No] Electronic Signature(s) Signed: 07/09/2023 3:34:06 PM By: Samuella Bruin Entered By: Samuella Bruin on 07/09/2023 07:08:13 -------------------------------------------------------------------------------- Multi Wound Chart Details Patient Name: Date of Service: [name], [name] 07/09/2023 10:00 A M Medical Record Number: [id number] Patient Account Number: [id number] Date of Birth/Sex: Treating RN: 05/18/1966 (57 y.o. F) Primary Care Sharmane Dame: PCP, NO Other  Clinician: Referring Shweta Aman: Treating Malani Lees/Extender: Sherryl Manges in Treatment: 1 Vital Signs Height(in): 71 Pulse(bpm): 123 Weight(lbs): 352 Blood Pressure(mmHg): 215/98 Body Mass Index(BMI): 49.1 Temperature(F): 98.3 Respiratory Rate(breaths/min): 18 [1:Photos:] Right, Anterior Lower Leg Right, Lateral Lower Leg Left, Proximal, Medial Lower Leg Wound Location: Gradually Appeared Gradually Appeared Gradually Appeared Wounding Event: Lymphedema Lymphedema Lymphedema Primary Etiology: Anemia, Asthma, Congestive Heart Anemia, Asthma, Congestive Heart Anemia, Asthma, Congestive Heart Comorbid History: Failure, Deep Vein Thrombosis, Failure, Deep Vein Thrombosis, Failure, Deep Vein Thrombosis, Hypertension, Osteoarthritis Hypertension, Osteoarthritis Hypertension, Osteoarthritis 04/01/2023 07/01/2021 04/01/2023 Date Acquired: 1 1 1  Weeks of Treatment: Open Open Open Wound Status: No No No Wound Recurrence: Yes Yes No Clustered Wound: 0x0x0 3.2x0.5x0.1 1.6x1.6x0.2 Measurements L x W x D (cm) 0 1.257 2.011 A (cm) : rea 0 0.126 0.402 Volume (cm) : 100.00% 75.40% 48.80% % Reduction in Area: 100.00% 75.30% 65.90% % Reduction in VolumeTEDRA, COPPERNOLL A ([id number]) [phone].pdf Page 4 of 14 Full Thickness Without Exposed Full Thickness Without Exposed Full Thickness Without Exposed Classification: Support Structures Support Structures Support Structures None Present Medium Medium Exudate A mount: N/A Serosanguineous Serosanguineous Exudate Type: N/A red, brown red, brown Exudate Color: Flat and Intact Distinct, outline attached Distinct, outline attached Wound Margin: None Present (0%) Large (67-100%) Large (67-100%) Granulation A mount: N/A Red Red Granulation Quality: None Present (0%) Small (1-33%) Small (1-33%) Necrotic A mount: Fascia: No Fat Layer (Subcutaneous Tissue): Yes Fat Layer (Subcutaneous Tissue): Yes Exposed  Structures: Fat Layer (Subcutaneous Tissue): No Fascia: No Fascia: No Tendon: No Tendon: No Tendon: No Muscle: No Muscle: No Muscle: No Joint: No Joint: No Joint: No Bone: No Bone: No Bone: No Large (67-100%) Small (1-33%) Small (1-33%) Epithelialization: N/A Debridement - Selective/Open Wound Debridement - Excisional Debridement: Pre-procedure Verification/Time Out N/A 10:27 10:27 Taken: N/A Lidocaine 4% Topical Solution Lidocaine 4% Topical Solution Pain Control: N/A Slough Subcutaneous, Slough Tissue Debrided: N/A Non-Viable Tissue Skin/Subcutaneous Tissue Level: N/A 1.26 2.01 Debridement A (sq cm): rea N/A Curette Curette Instrument: N/A Minimum Minimum Bleeding: N/A Pressure Pressure Hemostasis A chieved: N/A Procedure was tolerated well Procedure was tolerated well Debridement Treatment Response: N/A 3.2x0.5x0.1 1.6x1.6x0.2 Post Debridement Measurements L x W x D (cm) N/A 0.126 0.402 Post Debridement Volume: (cm) Scarring: Yes Scarring: Yes No Abnormalities Noted Periwound Skin Texture: No Abnormalities Noted No Abnormalities Noted No Abnormalities Noted Periwound Skin Moisture: Hemosiderin Staining: Yes Hemosiderin Staining: Yes Hemosiderin Staining: Yes Periwound Skin Color: No Abnormality No Abnormality No Abnormality Temperature: N/A Compression Therapy Compression Therapy Procedures Performed: Debridement Debridement Wound Number: 4 5 6  Photos: Left, Distal, Medial Lower Leg Left, Anterior Lower Leg Left, Lateral Lower Leg Wound Location: Gradually Appeared Gradually  Appeared Gradually Appeared Wounding Event: Lymphedema Lymphedema Lymphedema Primary Etiology: Anemia, Asthma, Congestive Heart Anemia, Asthma, Congestive Heart Anemia, Asthma, Congestive Heart Comorbid History: Failure, Deep Vein Thrombosis, Failure, Deep Vein Thrombosis, Failure, Deep Vein Thrombosis, Hypertension, Osteoarthritis Hypertension, Osteoarthritis Hypertension,  Osteoarthritis 06/18/2023 06/19/2023 08/09/2020 Date Acquired: 1 1 1  Weeks of Treatment: Healed - Epithelialized Healed - Epithelialized Open Wound Status: No No No Wound Recurrence: No Yes No Clustered Wound: 0x0x0 0x0x0 1.8x1.5x0.2 Measurements L x W x D (cm) 0 0 2.121 A (cm) : rea 0 0 0.424 Volume (cm) : 100.00% 100.00% 41.30% % Reduction in A rea: 100.00% 100.00% 60.90% % Reduction in Volume: Full Thickness Without Exposed Full Thickness Without Exposed Full Thickness Without Exposed Classification: Support Structures Support Structures Support Structures None Present None Present Medium Exudate A mount: N/A N/A Serosanguineous Exudate Type: N/A N/A red, brown Exudate Color: Distinct, outline attached Distinct, outline attached Distinct, outline attached Wound Margin: None Present (0%) None Present (0%) Medium (34-66%) Granulation A mount: N/A N/A Red Granulation Quality: None Present (0%) None Present (0%) Medium (34-66%) Necrotic A mount: Fascia: No Fascia: No Fat Layer (Subcutaneous Tissue): Yes Exposed Structures: Fat Layer (Subcutaneous Tissue): No Fat Layer (Subcutaneous Tissue): No Fascia: No Tendon: No Tendon: No Tendon: No Muscle: No Muscle: No Muscle: No Joint: No Joint: No Joint: No Bone: No Bone: No Bone: No Large (67-100%) Large (67-100%) Small (1-33%) Epithelialization: N/A N/A Debridement - Excisional Debridement: Pre-procedure Verification/Time Out N/A N/A 10:27 Taken: N/A N/A Lidocaine 4% Topical Solution Pain Control: N/A N/A Subcutaneous, Slough Tissue Debrided: N/A N/A Skin/Subcutaneous Tissue LevelRIYANNA, CRUTCHLEY A ([id number]) [id number] Page 5 of 14 N/A N/A 2.12 Debridement A (sq cm): rea N/A N/A Curette Instrument: N/A N/A Minimum Bleeding: N/A N/A Pressure Hemostasis A chieved: N/A N/A Procedure was tolerated well Debridement Treatment Response: N/A N/A 1.8x1.5x0.2 Post Debridement  Measurements L x W x D (cm) N/A N/A 0.424 Post Debridement Volume: (cm) Scarring: Yes Scarring: Yes Scarring: Yes Periwound Skin Texture: No Abnormalities Noted No Abnormalities Noted No Abnormalities Noted Periwound Skin Moisture: Hemosiderin Staining: Yes Hemosiderin Staining: Yes Hemosiderin Staining: Yes Periwound Skin Color: No Abnormality No Abnormality No Abnormality Temperature: N/A N/A Debridement Procedures Performed: Treatment Notes Electronic Signature(s) Signed: 07/09/2023 10:38:19 AM By: Duanne Guess MD FACS Entered By: Duanne Guess on 07/09/2023 07:38:19 -------------------------------------------------------------------------------- Multi-Disciplinary Care Plan Details Patient Name: Date of Service: [name], [name] 07/09/2023 10:00 A M Medical Record Number: [id number] Patient Account Number: [id number] Date of Birth/Sex: Treating RN: 08/02/1966 (57 y.o. Fredderick Phenix Primary Care Sereniti Wan: PCP, [name]: Referring Mellany Dinsmore: Treating Laneta Guerin/Extender: Sherryl Manges in Treatment: 1 Active Inactive Wound/Skin Impairment Nursing Diagnoses: Impaired tissue integrity Goals: Patient/caregiver will verbalize understanding of skin care regimen Date Initiated: 07/02/2023 Target Resolution Date: 09/25/2023 Goal Status: Active Interventions: Assess ulceration(s) every visit Treatment Activities: Skin care regimen initiated : 07/02/2023 Notes: Electronic Signature(s) Signed: 07/09/2023 3:34:06 PM By: Samuella Bruin Entered By: Samuella Bruin on 07/09/2023 08:16:58 -------------------------------------------------------------------------------- Pain Assessment Details Patient Name: Date of Service: [name], [name] 07/09/2023 10:00 A M Medical Record Number: [id number] Patient Account Number: [id number] Date of Birth/Sex: Treating RN: 1966/07/23 (57 y.o. Destane, Speas, [place] A ([id number])  [id number] Page 6 of 14 Primary Care Hiren Peplinski: PCP, [name]: Referring Carlas Vandyne: Treating Breeann Reposa/Extender: Sherryl Manges in Treatment: 1 Active Problems Location of Pain Severity and Description of Pain Patient Has Paino No Site Locations Rate the pain. Current Pain Level: 0  Pain Management and Medication Current Pain Management: Electronic Signature(s) Signed: 07/09/2023 3:34:06 PM By: Samuella Bruin Entered By: Samuella Bruin on 07/09/2023 07:03:03 -------------------------------------------------------------------------------- Patient/Caregiver Education Details Patient Name: Date of Service: [name], [name] 10/14/2024andnbsp10:00 A M Medical Record Number: [id number] Patient Account Number: [id number] Date of Birth/Gender: Treating RN: [birth year] (57 y.o. Fredderick Phenix Primary Care Physician: PCP, [name]: Referring Physician: Treating Physician/Extender: Sherryl Manges in Treatment: 1 Education Assessment Education Provided To: Patient Education Topics Provided Wound/Skin Impairment: Methods: Explain/Verbal Responses: Reinforcements needed, State content correctly Electronic Signature(s) Signed: 07/09/2023 3:34:06 PM By: Samuella Bruin Entered By: Samuella Bruin on 07/09/2023 08:17:10 Claude Manges A ([id number]) [id number] Page 7 of 14 -------------------------------------------------------------------------------- Wound Assessment Details Patient Name: Date of Service: [name], [name] 07/09/2023 10:00 A M Medical Record Number: [id number] Patient Account Number: [id number] Date of Birth/Sex: Treating RN: September 06, 1966 (57 y.o. Fredderick Phenix Primary Care Ledia Hanford: PCP, [name]: Referring Ras Kollman: Treating Deckard Stuber/Extender: Sherryl Manges in Treatment: 1 Wound Status Wound Number: 1 Primary Lymphedema Etiology: Wound  Location: Right, Anterior Lower Leg Wound Open Wounding Event: Gradually Appeared Status: Date Acquired: 04/01/2023 Comorbid Anemia, Asthma, Congestive Heart Failure, Deep Vein Weeks Of Treatment: 1 History: Thrombosis, Hypertension, Osteoarthritis Clustered Wound: Yes Photos Wound Measurements Length: (cm) Width: (cm) Depth: (cm) Area: (cm) Volume: (cm) 0 % Reduction in Area: 100% 0 % Reduction in Volume: 100% 0 Epithelialization: Large (67-100%) 0 Tunneling: No 0 Undermining: No Wound Description Classification: Full Thickness Without Exposed Support Structures Wound Margin: Flat and Intact Exudate Amount: None Present Foul Odor After Cleansing: No Slough/Fibrino No Wound Bed Granulation Amount: None Present (0%) Exposed Structure Necrotic Amount: None Present (0%) Fascia Exposed: No Fat Layer (Subcutaneous Tissue) Exposed: No Tendon Exposed: No Muscle Exposed: No Joint Exposed: No Bone Exposed: No Periwound Skin Texture Texture Color No Abnormalities Noted: No No Abnormalities Noted: No Scarring: Yes Hemosiderin Staining: Yes Moisture Temperature / Pain No Abnormalities Noted: Yes Temperature: No Abnormality Electronic Signature(s) Signed: 07/09/2023 3:34:06 PM By: Samuella Bruin Entered By: Samuella Bruin on 07/09/2023 07:17:15 [name] ([id number]) [id number] Page 8 of 14 -------------------------------------------------------------------------------- Wound Assessment Details Patient Name: Date of Service: [name], [name] 07/09/2023 10:00 A M Medical Record Number: [id number] Patient Account Number: [id number] Date of Birth/Sex: Treating RN: 11-12-1965 (57 y.o. Fredderick Phenix Primary Care Izaiha Lo: PCP, [name]: Referring Oland Arquette: Treating Jearldine Cassady/Extender: Sherryl Manges in Treatment: 1 Wound Status Wound Number: 2 Primary Lymphedema Etiology: Wound Location: Right, Lateral Lower  Leg Wound Open Wounding Event: Gradually Appeared Status: Date Acquired: 07/01/2021 Comorbid Anemia, Asthma, Congestive Heart Failure, Deep Vein Weeks Of Treatment: 1 History: Thrombosis, Hypertension, Osteoarthritis Clustered Wound: Yes Photos Wound Measurements Length: (cm) 3.2 Width: (cm) 0.5 Depth: (cm) 0.1 Area: (cm) 1.257 Volume: (cm) 0.126 % Reduction in Area: 75.4% % Reduction in Volume: 75.3% Epithelialization: Small (1-33%) Tunneling: No Undermining: No Wound Description Classification: Full Thickness Without Exposed Suppor Wound Margin: Distinct, outline attached Exudate Amount: Medium Exudate Type: Serosanguineous Exudate Color: red, brown t Structures Foul Odor After Cleansing: No Slough/Fibrino Yes Wound Bed Granulation Amount: Large (67-100%) Exposed Structure Granulation Quality: Red Fascia Exposed: No Necrotic Amount: Small (1-33%) Fat Layer (Subcutaneous Tissue) Exposed: Yes Necrotic Quality: Adherent Slough Tendon Exposed: No Muscle Exposed: No Joint Exposed: No Bone Exposed: No Periwound Skin Texture Texture Color No Abnormalities Noted: Yes No Abnormalities Noted: No Hemosiderin Staining: Yes Moisture No Abnormalities Noted: Yes Temperature / Pain Temperature:  No Abnormality Treatment Notes Wound #2 (Lower Leg) Wound Laterality: Right, Lateral Cleanser Soap and Water OTTO, CARAWAY A ([id number]) [id number] Page 9 of 14 Discharge Instruction: At Wound center-May shower and wash wound with dial antibacterial soap and water prior to dressing change. Peri-Wound Care Triamcinolone 15 (g) Discharge Instruction: Use triamcinolone 15 (g) as directed Sween Lotion (Moisturizing lotion) Discharge Instruction: Apply moisturizing lotion as directed Topical Primary Dressing Hydrofera Blue Ready Transfer Foam, 4x5 (in/in) Discharge Instruction: Apply to wound bed as instructed Secondary Dressing Woven Gauze Sponge,  Non-Sterile 4x4 in Discharge Instruction: Apply over primary dressing as directed. Secured With Compression Wrap Unnaboot w/Calamine, 4x10 (in/yd) Discharge Instruction: Apply Unnaboot as directed. Compression Stockings Add-Ons Electronic Signature(s) Signed: 07/09/2023 3:34:06 PM By: Samuella Bruin Entered By: Samuella Bruin on 07/09/2023 07:17:37 -------------------------------------------------------------------------------- Wound Assessment Details Patient Name: Date of Service: [name], [name] 07/09/2023 10:00 A M Medical Record Number: [id number] Patient Account Number: [id number] Date of Birth/Sex: Treating RN: September 14, 1966 (57 y.o. Fredderick Phenix Primary Care Zandra Lajeunesse: PCP, [name]: Referring Addasyn Mcbreen: Treating Hisham Provence/Extender: Sherryl Manges in Treatment: 1 Wound Status Wound Number: 3 Primary Lymphedema Etiology: Wound Location: Left, Proximal, Medial Lower Leg Wound Open Wounding Event: Gradually Appeared Status: Date Acquired: 04/01/2023 Comorbid Anemia, Asthma, Congestive Heart Failure, Deep Vein Weeks Of Treatment: 1 History: Thrombosis, Hypertension, Osteoarthritis Clustered Wound: No Photos Wound Measurements Length: (cm) 1.6 Width: (cm) 1.6 Depth: (cm) 0.2 Devall, Saddie A ([id number]) Area: (cm) 2.011 Volume: (cm) 0.402 % Reduction in Area: 48.8% % Reduction in Volume: 65.9% Epithelialization: Small (1-33%) [phone].pdf Page 10 of 14 Tunneling: No Undermining: No Wound Description Classification: Full Thickness Without Exposed Suppor Wound Margin: Distinct, outline attached Exudate Amount: Medium Exudate Type: Serosanguineous Exudate Color: red, brown t Structures Foul Odor After Cleansing: No Slough/Fibrino Yes Wound Bed Granulation Amount: Large (67-100%) Exposed Structure Granulation Quality: Red Fascia Exposed: No Necrotic Amount: Small (1-33%) Fat Layer (Subcutaneous Tissue)  Exposed: Yes Necrotic Quality: Adherent Slough Tendon Exposed: No Muscle Exposed: No Joint Exposed: No Bone Exposed: No Periwound Skin Texture Texture Color No Abnormalities Noted: Yes No Abnormalities Noted: No Hemosiderin Staining: Yes Moisture No Abnormalities Noted: Yes Temperature / Pain Temperature: No Abnormality Treatment Notes Wound #3 (Lower Leg) Wound Laterality: Left, Medial, Proximal Cleanser Soap and Water Discharge Instruction: At Wound center-May shower and wash wound with dial antibacterial soap and water prior to dressing change. Peri-Wound Care Triamcinolone 15 (g) Discharge Instruction: Use triamcinolone 15 (g) as directed Sween Lotion (Moisturizing lotion) Discharge Instruction: Apply moisturizing lotion as directed Topical Primary Dressing Hydrofera Blue Ready Transfer Foam, 4x5 (in/in) Discharge Instruction: Apply to wound bed as instructed Secondary Dressing Woven Gauze Sponge, Non-Sterile 4x4 in Discharge Instruction: Apply over primary dressing as directed. Secured With Compression Wrap Unnaboot w/Calamine, 4x10 (in/yd) Discharge Instruction: Apply Unnaboot as directed. Compression Stockings Add-Ons Electronic Signature(s) Signed: 07/09/2023 3:34:06 PM By: Samuella Bruin Entered By: Samuella Bruin on 07/09/2023 07:17:59 Wound Assessment Details -------------------------------------------------------------------------------- [name] ([id number]) [id number] Page 11 of 14 Patient Name: Date of Service: [name], [name] 07/09/2023 10:00 A M Medical Record Number: [id number] Patient Account Number: [id number] Date of Birth/Sex: Treating RN: August 12, 1966 (57 y.o. Fredderick Phenix Primary Care Tailyn Hantz: PCP, [name]: Referring Caedence Snowden: Treating Taurus Alamo/Extender: Sherryl Manges in Treatment: 1 Wound Status Wound Number: 4 Primary Lymphedema Etiology: Wound Location: Left, Distal,  Medial Lower Leg Wound Healed - Epithelialized Wounding Event: Gradually Appeared Status: Date Acquired: 06/18/2023 Comorbid Anemia, Asthma, Congestive  Heart Failure, Deep Vein Weeks Of Treatment: 1 History: Thrombosis, Hypertension, Osteoarthritis Clustered Wound: No Photos Wound Measurements Length: (cm) 0 % Reduction in Area: 100% Width: (cm) 0 % Reduction in Volume: 100% Depth: (cm) 0 Epithelialization: Large (67-100%) Area: (cm) 0 Tunneling: No Volume: (cm) 0 Undermining: No Wound Description Classification: Full Thickness Without Exposed Support Structures Foul Odor After Cleansing: No Wound Margin: Distinct, outline attached Slough/Fibrino No Exudate Amount: None Present Wound Bed Granulation Amount: None Present (0%) Exposed Structure Necrotic Amount: None Present (0%) Fascia Exposed: No Fat Layer (Subcutaneous Tissue) Exposed: No Tendon Exposed: No Muscle Exposed: No Joint Exposed: No Bone Exposed: No Periwound Skin Texture Texture Color No Abnormalities Noted: Yes No Abnormalities Noted: No Hemosiderin Staining: Yes Moisture No Abnormalities Noted: Yes Temperature / Pain Temperature: No Abnormality Electronic Signature(s) Signed: 07/09/2023 3:34:06 PM By: Samuella Bruin Entered By: Samuella Bruin on 07/09/2023 07:28:44 -------------------------------------------------------------------------------- Wound Assessment Details Patient Name: Date of Service: [name], [name] 07/09/2023 10:00 A M Medical Record Number: [id number] Patient Account Number: [id number] TONNIA, BARDIN A ([id number]) [phone].pdf Page 12 of 14 Date of Birth/Sex: Treating RN: [birth year] (57 y.o. Fredderick Phenix Primary Care Melana Hingle: PCP, [name]: Referring Chitara Clonch: Treating Avari Gelles/Extender: Sherryl Manges in Treatment: 1 Wound Status Wound Number: 5 Primary Lymphedema Etiology: Wound Location: Left, Anterior Lower  Leg Wound Healed - Epithelialized Wounding Event: Gradually Appeared Status: Date Acquired: 06/19/2023 Comorbid Anemia, Asthma, Congestive Heart Failure, Deep Vein Weeks Of Treatment: 1 History: Thrombosis, Hypertension, Osteoarthritis Clustered Wound: Yes Photos Wound Measurements Length: (cm) Width: (cm) Depth: (cm) Area: (cm) Volume: (cm) 0 % Reduction in Area: 100% 0 % Reduction in Volume: 100% 0 Epithelialization: Large (67-100%) 0 Tunneling: No 0 Undermining: No Wound Description Classification: Full Thickness Without Exposed Support Structures Wound Margin: Distinct, outline attached Exudate Amount: None Present Foul Odor After Cleansing: No Slough/Fibrino No Wound Bed Granulation Amount: None Present (0%) Exposed Structure Necrotic Amount: None Present (0%) Fascia Exposed: No Fat Layer (Subcutaneous Tissue) Exposed: No Tendon Exposed: No Muscle Exposed: No Joint Exposed: No Bone Exposed: No Periwound Skin Texture Texture Color No Abnormalities Noted: Yes No Abnormalities Noted: No Hemosiderin Staining: Yes Moisture No Abnormalities Noted: Yes Temperature / Pain Temperature: No Abnormality Electronic Signature(s) Signed: 07/09/2023 3:34:06 PM By: Samuella Bruin Entered By: Samuella Bruin on 07/09/2023 07:29:38 -------------------------------------------------------------------------------- Wound Assessment Details Patient Name: Date of Service: [name], [name] 07/09/2023 10:00 A M Medical Record Number: [id number] Patient Account Number: [id number] Date of Birth/Sex: Treating RN: Dec 05, 1965 (57 y.o. Fredderick Phenix Primary Care Yohanna Tow: PCP, [name]: [name] ([id number]) [id number] Page 13 of 14 Referring Ayva Veilleux: Treating Joei Frangos/Extender: Sherryl Manges in Treatment: 1 Wound Status Wound Number: 6 Primary Lymphedema Etiology: Wound Location: Left, Lateral Lower Leg Wound  Open Wounding Event: Gradually Appeared Status: Date Acquired: 08/09/2020 Comorbid Anemia, Asthma, Congestive Heart Failure, Deep Vein Weeks Of Treatment: 1 History: Thrombosis, Hypertension, Osteoarthritis Clustered Wound: No Photos Wound Measurements Length: (cm) 1.8 Width: (cm) 1.5 Depth: (cm) 0.2 Area: (cm) 2.121 Volume: (cm) 0.424 % Reduction in Area: 41.3% % Reduction in Volume: 60.9% Epithelialization: Small (1-33%) Tunneling: No Undermining: No Wound Description Classification: Full Thickness Without Exposed Support Structures Wound Margin: Distinct, outline attached Exudate Amount: Medium Exudate Type: Serosanguineous Exudate Color: red, brown Foul Odor After Cleansing: No Slough/Fibrino Yes Wound Bed Granulation Amount: Medium (34-66%) Exposed Structure Granulation Quality: Red Fascia Exposed: No Necrotic Amount: Medium (34-66%) Fat Layer (Subcutaneous Tissue) Exposed: Yes Necrotic  Quality: Adherent Slough Tendon Exposed: No Muscle Exposed: No Joint Exposed: No Bone Exposed: No Periwound Skin Texture Texture Color No Abnormalities Noted: Yes No Abnormalities Noted: No Hemosiderin Staining: Yes Moisture No Abnormalities Noted: Yes Temperature / Pain Temperature: No Abnormality Treatment Notes Wound #6 (Lower Leg) Wound Laterality: Left, Lateral Cleanser Soap and Water Discharge Instruction: At Wound center-May shower and wash wound with dial antibacterial soap and water prior to dressing change. Peri-Wound Care Triamcinolone 15 (g) Discharge Instruction: Use triamcinolone 15 (g) as directed Sween Lotion (Moisturizing lotion) Discharge Instruction: Apply moisturizing lotion as directed Topical Primary Dressing SHENIAH, SUPAK A ([id number]) [id number] Page 14 of 14 Hydrofera Blue Ready Transfer Foam, 4x5 (in/in) Discharge Instruction: Apply to wound bed as instructed Secondary Dressing Woven Gauze Sponge, Non-Sterile 4x4  in Discharge Instruction: Apply over primary dressing as directed. Secured With Compression Wrap Unnaboot w/Calamine, 4x10 (in/yd) Discharge Instruction: Apply Unnaboot as directed. Compression Stockings Add-Ons Electronic Signature(s) Signed: 07/09/2023 3:34:06 PM By: Samuella Bruin Entered By: Samuella Bruin on 07/09/2023 07:19:17 -------------------------------------------------------------------------------- Vitals Details Patient Name: Date of Service: [name], [name] 07/09/2023 10:00 A M Medical Record Number: [id number] Patient Account Number: [id number] Date of Birth/Sex: Treating RN: 1965/11/07 (57 y.o. Fredderick Phenix Primary Care Marvens Hollars: PCP, [name]: Referring Arrow Emmerich: Treating Irving Bloor/Extender: Sherryl Manges in Treatment: 1 Vital Signs Time Taken: 10:03 Temperature (F): 98.3 Height (in): 71 Pulse (bpm): 123 Weight (lbs): 352 Respiratory Rate (breaths/min): 18 Body Mass Index (BMI): 49.1 Blood Pressure (mmHg): 215/98 Reference Range: 80 - 120 mg / dl Electronic Signature(s) Signed: 07/09/2023 3:34:06 PM By: Samuella Bruin Entered By: Samuella Bruin on 07/09/2023 07:06:17

## 2023-07-16 ENCOUNTER — Encounter (HOSPITAL_BASED_OUTPATIENT_CLINIC_OR_DEPARTMENT_OTHER): Payer: Medicare HMO | Admitting: General Surgery

## 2023-07-16 DIAGNOSIS — I110 Hypertensive heart disease with heart failure: Secondary | ICD-10-CM | POA: Diagnosis not present

## 2023-07-16 DIAGNOSIS — I890 Lymphedema, not elsewhere classified: Secondary | ICD-10-CM | POA: Diagnosis not present

## 2023-07-16 DIAGNOSIS — L97822 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osed: Secondary | ICD-10-CM | POA: Diagnosis not present

## 2023-07-16 DIAGNOSIS — Z86718 Personal history of other venous thrombosis and embolism: Secondary | ICD-10-CM | POA: Diagnosis not present

## 2023-07-16 DIAGNOSIS — I5032 Chronic diastolic (congestive) heart failure: Secondary | ICD-10-CM | POA: Diagnosis not present

## 2023-07-16 DIAGNOSIS — L97812 Non-pressure chronic ulcer of other part of right lower leg with fat layer exposed: Secondary | ICD-10-CM | POA: Diagnosis not present

## 2023-07-16 DIAGNOSIS — M1990 Unspecified osteoarthritis, unspecified site: Secondary | ICD-10-CM | POA: Diagnosis not present

## 2023-07-16 NOTE — Progress Notes (Addendum)
CELINES, RIZVI A ([id number]) [id number] Page 1 of 11 Visit Report for 07/16/2023 Chief Complaint Document Details Patient Name: Date of Service: [name], [name] 07/16/2023 7:45 A M Medical Record Number: [id number] Patient Account Number: [id number] Date of Birth/Sex: Treating RN: [birth year] (57 y.o. F) Primary Care Provider: PCP, [name]: Referring Provider: Treating Provider/Extender: Sherryl Manges in Treatment: 2 Information Obtained from: Patient Chief Complaint Patient presents for treatment of open ulcers due to lymphedema Electronic Signature(s) Signed: 07/16/2023 8:39:49 AM By: Duanne Guess MD FACS Entered By: Duanne Guess on 07/16/2023 05:39:49 -------------------------------------------------------------------------------- Debridement Details Patient Name: Date of Service: Joan Officer, [name] 07/16/2023 7:45 A M Medical Record Number: [id number] Patient Account Number: [id number] Date of Birth/Sex: Treating RN: 03/05/1966 (57 y.o. Fredderick Phenix Primary Care Provider: PCP, [name]: Referring Provider: Treating Provider/Extender: Sherryl Manges in Treatment: 2 Debridement Performed for Assessment: Wound #6 Left,Lateral Lower Leg Performed By: Physician Duanne Guess, MD The following information was scribed by: Samuella Bruin The information was scribed for: Duanne Guess Debridement Type: Debridement Level of Consciousness (Pre-procedure): Awake and Alert Pre-procedure Verification/Time Out Yes - 08:15 Taken: Start Time: 08:15 Pain Control: Lidocaine 4% T opical Solution Percent of Wound Bed Debrided: 100% T Area Debrided (cm): otal 1.65 Tissue and other material debrided: Non-Viable, Slough, Slough Level: Non-Viable Tissue Debridement Description: Selective/Open Wound Instrument: Curette Bleeding: Minimum Hemostasis Achieved: Pressure Response to Treatment: Procedure was  tolerated well Level of Consciousness (Post- Awake and Alert procedure): Post Debridement Measurements of Total Wound Length: (cm) 1.4 Width: (cm) 1.5 Depth: (cm) 0.2 Volume: (cm) 0.33 Character of Wound/Ulcer Post Debridement: Improved Post Procedure Diagnosis ELLANORA, TANIS A ([id number]) [id number] Page 2 of 11 Same as Pre-procedure Electronic Signature(s) Signed: 07/16/2023 10:33:56 AM By: Samuella Bruin Signed: 07/16/2023 11:01:28 AM By: Duanne Guess MD FACS Entered By: Samuella Bruin on 07/16/2023 05:15:49 -------------------------------------------------------------------------------- Debridement Details Patient Name: Date of Service: Joan Officer, [name] 07/16/2023 7:45 A M Medical Record Number: [id number] Patient Account Number: [id number] Date of Birth/Sex: Treating RN: Oct 28, 1965 (57 y.o. Fredderick Phenix Primary Care Provider: PCP, [name]: Referring Provider: Treating Provider/Extender: Sherryl Manges in Treatment: 2 Debridement Performed for Assessment: Wound #3 Left,Proximal,Medial Lower Leg Performed By: Physician Duanne Guess, MD The following information was scribed by: Samuella Bruin The information was scribed for: Duanne Guess Debridement Type: Debridement Level of Consciousness (Pre-procedure): Awake and Alert Pre-procedure Verification/Time Out Yes - 08:15 Taken: Start Time: 08:15 Pain Control: Lidocaine 4% T opical Solution Percent of Wound Bed Debrided: 100% T Area Debrided (cm): otal 1.22 Tissue and other material debrided: Non-Viable, Slough, Subcutaneous, Slough Level: Skin/Subcutaneous Tissue Debridement Description: Excisional Instrument: Curette Bleeding: Minimum Hemostasis Achieved: Pressure Response to Treatment: Procedure was tolerated well Level of Consciousness (Post- Awake and Alert procedure): Post Debridement Measurements of Total Wound Length: (cm)  1.2 Width: (cm) 1.3 Depth: (cm) 0.2 Volume: (cm) 0.245 Character of Wound/Ulcer Post Debridement: Improved Post Procedure Diagnosis Same as Pre-procedure Electronic Signature(s) Signed: 07/16/2023 10:33:56 AM By: Samuella Bruin Signed: 07/16/2023 11:01:28 AM By: Duanne Guess MD FACS Entered By: Samuella Bruin on 07/16/2023 05:16:26 -------------------------------------------------------------------------------- Debridement Details Patient Name: Date of Service: Joan Officer, [name] 07/16/2023 7:45 A M Medical Record Number: [id number] Patient Account Number: [id number] Date of Birth/Sex: Treating RN: Oct 11, 1965 (57 y.o. Fredderick Phenix Primary Care Provider: PCP, [name]: [name] ([id number]) [id number] Page 3 of 11 Referring Provider: Treating Provider/Extender: Duanne Guess  Weeks in Treatment: 2 Debridement Performed for Assessment: Wound #2 Right,Lateral Lower Leg Performed By: Physician Duanne Guess, MD The following information was scribed by: Samuella Bruin The information was scribed for: Duanne Guess Debridement Type: Debridement Level of Consciousness (Pre-procedure): Awake and Alert Pre-procedure Verification/Time Out Yes - 08:15 Taken: Start Time: 08:15 Pain Control: Lidocaine 4% Topical Solution Percent of Wound Bed Debrided: 50% T Area Debrided (cm): otal 0.47 Tissue and other material debrided: Non-Viable, Eschar, Slough, Slough Level: Non-Viable Tissue Debridement Description: Selective/Open Wound Instrument: Curette Bleeding: Minimum Hemostasis Achieved: Pressure Response to Treatment: Procedure was tolerated well Level of Consciousness (Post- Awake and Alert procedure): Post Debridement Measurements of Total Wound Length: (cm) 3 Width: (cm) 0.4 Depth: (cm) 0.1 Volume: (cm) 0.094 Character of Wound/Ulcer Post Debridement: Improved Post Procedure Diagnosis Same as  Pre-procedure Electronic Signature(s) Signed: 07/16/2023 10:33:56 AM By: Samuella Bruin Signed: 07/16/2023 11:01:28 AM By: Duanne Guess MD FACS Entered By: Samuella Bruin on 07/16/2023 05:17:19 -------------------------------------------------------------------------------- HPI Details Patient Name: Date of Service: Joan Officer, [name] 07/16/2023 7:45 A M Medical Record Number: [id number] Patient Account Number: [id number] Date of Birth/Sex: Treating RN: [birth year] (57 y.o. F) Primary Care Provider: PCP, [name]: Referring Provider: Treating Provider/Extender: Sherryl Manges in Treatment: 2 History of Present Illness HPI Description: ADMISSION 07/02/2023 ***ABIs non-compressible bilaterally; triphasic Doppler signals*** This is a 57 year old morbidly obese patient who is not a diabetic. She does have hypertension and CHF. Although she does not carry this diagnosis in her electronic medical record, she clearly has lymphedema. She has multiple wounds on her bilateral lower extremities, 1 of which has been present for 3 years. She has been treating her wounds with peroxide and bacitracin. Her provider has given her various courses of antibiotics, but the wounds have not closed. She was told not to wear compression stockings due to her history of DVT but this was well over a year ago. She has never worn lymphedema pumps. , 07/09/2023: Several of the smaller wounds have closed. Remaining wounds include the left proximal medial ulcer and the left lateral distal ulcer, as well as the paired wounds on her right lateral lower leg. There is slough accumulation at all sites. 07/16/2023: The left distal lateral leg ulcer is a little bit smaller and quite a bit cleaner. The more proximal medial left lower leg ulcer still has a fair amount of nonviable tissue present and this more tender. Her wrap did slide down and it appears that it came to rest right in the middle of this  wound causing some pressure on the site. The paired right lateral lower leg ulcers are small and superficial with a little bit of slough and eschar present. Electronic Signature(s) Signed: 07/16/2023 8:41:25 AM By: Duanne Guess MD FACS Claude Manges A ([id number]) [id number] Page 4 of 11 Signed: 07/16/2023 8:41:25 AM By: Duanne Guess MD FACS Entered By: Duanne Guess on 07/16/2023 05:41:25 -------------------------------------------------------------------------------- Physical Exam Details Patient Name: Date of Service: Joan Officer, [name] 07/16/2023 7:45 A M Medical Record Number: [id number] Patient Account Number: [id number] Date of Birth/Sex: Treating RN: January 20, 1966 (58 y.o. F) Primary Care Provider: PCP, [name]: Referring Provider: Treating Provider/Extender: Sherryl Manges in Treatment: 2 Constitutional Hypertensive, asymptomatic. Tachycardic, asymptomatic. . . no acute distress. Respiratory Normal work of breathing on room air. Notes 07/16/2023: The left distal lateral leg ulcer is a little bit smaller and quite a bit cleaner. The more proximal medial left lower leg ulcer still has a  fair amount of nonviable tissue present and this more tender. Her wrap did slide down and it appears that it came to rest right in the middle of this wound causing some pressure on the site. The paired right lateral lower leg ulcers are small and superficial with a little bit of slough and eschar present. Electronic Signature(s) Signed: 07/16/2023 8:41:58 AM By: Duanne Guess MD FACS Entered By: Duanne Guess on 07/16/2023 05:41:58 -------------------------------------------------------------------------------- Physician Orders Details Patient Name: Date of Service: Joan Officer, [name] 07/16/2023 7:45 A M Medical Record Number: [id number] Patient Account Number: [id number] Date of Birth/Sex: Treating RN: [birth year] (57 y.o. Fredderick Phenix Primary Care Provider: PCP, [name]: Referring Provider: Treating Provider/Extender: Sherryl Manges in Treatment: 2 The following information was scribed by: Samuella Bruin The information was scribed for: Duanne Guess Verbal / Phone Orders: No Diagnosis Coding ICD-10 Coding Code Description [phone] Non-pressure chronic ulcer of other part of right lower leg with fat layer exposed L97.822 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osed I89.0 Lymphedema, not elsewhere classified I50.32 Chronic diastolic (congestive) heart failure E66.01 Morbid (severe) obesity due to excess calories Follow-up Appointments ppointment in 1 week. - Dr. Lady Gary Room 3 Return A Anesthetic (In clinic) Topical Lidocaine 4% applied to wound bed Bathing/ Shower/ Hygiene May shower with protection but do not get wound dressing(s) wet. Protect dressing(s) with water repellant cover (for example, large plastic bag) or a cast cover and may then take shower. - Keep legs dry until next appointment. Use Cast Protectors if so desired. REYNE, SUMPTION A ([id number]) [id number] Page 5 of 11 Other Bathing/Shower/Hygiene Orders/Instructions: - May want to sponge bath if legs cannot be kept dry until next appointment. Edema Control - Lymphedema / SCD / Other Bilateral Lower Extremities Elevate legs to the level of the heart or above for 30 minutes daily and/or when sitting for 3-4 times a day throughout the day. Avoid standing for long periods of time. Exercise regularly - As tolerated Moisturize legs daily. Additional Orders / Instructions Follow Nutritious Diet - Try and increase Protein intake. The goal is 70g-100g per day. Examples are Chicken,fish.meat,pork, [place] yogurt ,eggs etc. Wound Treatment Wound #2 - Lower Leg Wound Laterality: Right, Lateral Cleanser: Soap and Water 1 x Per Week/30 Days Discharge Instructions: At  Wound center-May shower and wash wound with dial antibacterial soap and water prior to dressing change. Peri-Wound Care: Triamcinolone 15 (g) 1 x Per Week/30 Days Discharge Instructions: Use triamcinolone 15 (g) as directed Peri-Wound Care: Sween Lotion (Moisturizing lotion) 1 x Per Week/30 Days Discharge Instructions: Apply moisturizing lotion as directed Secondary Dressing: Woven Gauze Sponge, Non-Sterile 4x4 in 1 x Per Week/30 Days Discharge Instructions: Apply over primary dressing as directed. Compression Wrap: Unnaboot w/Calamine, 4x10 (in/yd) 1 x Per Week/30 Days Discharge Instructions: Apply Unnaboot as directed. Compression Stockings: Jobst Farrow Wrap 4000 (DME) Right Leg Compression Amount: 30-40 mmHG Discharge Instructions: Apply Renee Pain daily as instructed. Apply first thing in the morning, remove at night before bed. Wound #3 - Lower Leg Wound Laterality: Left, Medial, Proximal Cleanser: Soap and Water 1 x Per Week/30 Days Discharge Instructions: At Wound center-May shower and wash wound with dial antibacterial soap and water prior to dressing change. Peri-Wound Care: Triamcinolone 15 (g) 1 x Per Week/30 Days Discharge Instructions: Use triamcinolone 15 (g) as directed Peri-Wound Care: Sween Lotion (Moisturizing lotion) 1 x Per Week/30 Days Discharge Instructions: Apply moisturizing lotion as directed Prim  Dressing: Hydrofera Blue Ready Transfer Foam, 4x5 (in/in) 1 x Per Week/30 Days ary Discharge Instructions: Apply to wound bed as instructed Secondary Dressing: Woven Gauze Sponge, Non-Sterile 4x4 in 1 x Per Week/30 Days Discharge Instructions: Apply over primary dressing as directed. Compression Wrap: Unnaboot w/Calamine, 4x10 (in/yd) 1 x Per Week/30 Days Discharge Instructions: Apply Unnaboot as directed. Wound #6 - Lower Leg Wound Laterality: Left, Lateral Cleanser: Soap and Water 1 x Per Week/30 Days Discharge Instructions: At Wound center-May shower and wash wound  with dial antibacterial soap and water prior to dressing change. Peri-Wound Care: Triamcinolone 15 (g) 1 x Per Week/30 Days Discharge Instructions: Use triamcinolone 15 (g) as directed Peri-Wound Care: Sween Lotion (Moisturizing lotion) 1 x Per Week/30 Days Discharge Instructions: Apply moisturizing lotion as directed Prim Dressing: Hydrofera Blue Ready Transfer Foam, 4x5 (in/in) 1 x Per Week/30 Days ary Discharge Instructions: Apply to wound bed as instructed Secondary Dressing: Woven Gauze Sponge, Non-Sterile 4x4 in 1 x Per Week/30 Days Discharge Instructions: Apply over primary dressing as directed. Compression Wrap: Unnaboot w/Calamine, 4x10 (in/yd) 1 x Per Week/30 Days Discharge Instructions: Apply Unnaboot as directed. Compression Stockings: Jobst Farrow Wrap 4000 (DME) Left Leg Compression Amount: 30-40 mmHG Discharge Instructions: Apply Renee Pain daily as instructed. Apply first thing in the morning, remove at night before bed. VERLINDA, KINNISON A ([id number]) [id number] Page 6 of 11 Patient Medications llergies: No Known Allergies A Notifications Medication Indication Start End 07/16/2023 lidocaine DOSE topical 4 % cream - cream topical Electronic Signature(s) Signed: 07/18/2023 11:18:33 AM By: Duanne Guess MD FACS Signed: 07/18/2023 4:15:04 PM By: Samuella Bruin Previous Signature: 07/16/2023 11:01:28 AM Version By: Duanne Guess MD FACS Entered By: Samuella Bruin on 07/18/2023 08:13:07 -------------------------------------------------------------------------------- Problem List Details Patient Name: Date of Service: Joan Officer, [name] 07/16/2023 7:45 A M Medical Record Number: [id number] Patient Account Number: [id number] Date of Birth/Sex: Treating RN: 25-Mar-1966 (57 y.o. F) Primary Care Provider: PCP, [name]: Referring Provider: Treating Provider/Extender: Sherryl Manges in Treatment: 2 Active  Problems ICD-10 Encounter Code Description Active Date MDM Diagnosis L97.812 Non-pressure chronic ulcer of other part of right lower leg with fat layer 07/02/2023 No Yes exposed L97.822 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osed10/03/2023 No Yes I89.0 Lymphedema, not elsewhere classified 07/02/2023 No Yes I50.32 Chronic diastolic (congestive) heart failure 07/02/2023 No Yes E66.01 Morbid (severe) obesity due to excess calories 07/02/2023 No Yes Inactive Problems Resolved Problems Electronic Signature(s) Signed: 07/16/2023 8:39:15 AM By: Duanne Guess MD FACS Entered By: Duanne Guess on 07/16/2023 05:39:15 [name] ([id number]) [id number] Page 7 of 11 -------------------------------------------------------------------------------- Progress Note Details Patient Name: Date of Service: [name], [name] 07/16/2023 7:45 A M Medical Record Number: [id number] Patient Account Number: [id number] Date of Birth/Sex: Treating RN: 29-Dec-1965 (57 y.o. F) Primary Care Provider: PCP, [name]: Referring Provider: Treating Provider/Extender: Sherryl Manges in Treatment: 2 Subjective Chief Complaint Information obtained from Patient Patient presents for treatment of open ulcers due to lymphedema History of Present Illness (HPI) ADMISSION 07/02/2023 ***ABIs non-compressible bilaterally; triphasic Doppler signals*** This is a 57 year old morbidly obese patient who is not a diabetic. She does have hypertension and CHF. Although she does not carry this diagnosis in her electronic medical record, she clearly has lymphedema. She has multiple wounds on her bilateral lower extremities, 1 of which has been present for 3 years. She has been treating her wounds with peroxide and bacitracin. Her provider has  given her various courses of antibiotics, but the wounds have not closed. She was told not to wear compression  stockings due to her history of DVT but this was well over a year ago. She has never worn lymphedema pumps. , 07/09/2023: Several of the smaller wounds have closed. Remaining wounds include the left proximal medial ulcer and the left lateral distal ulcer, as well as the paired wounds on her right lateral lower leg. There is slough accumulation at all sites. 07/16/2023: The left distal lateral leg ulcer is a little bit smaller and quite a bit cleaner. The more proximal medial left lower leg ulcer still has a fair amount of nonviable tissue present and this more tender. Her wrap did slide down and it appears that it came to rest right in the middle of this wound causing some pressure on the site. The paired right lateral lower leg ulcers are small and superficial with a little bit of slough and eschar present. Patient History Information obtained from Patient. Family History Unknown History. Social History Never smoker, Marital Status - Divorced, Alcohol Use - Rarely, Drug Use - No History, Caffeine Use - Daily - Tea,Coffee ,Soda. Medical History Hematologic/Lymphatic Patient has history of Anemia Respiratory Patient has history of Asthma Cardiovascular Patient has history of Congestive Heart Failure, Deep Vein Thrombosis, Hypertension Musculoskeletal Patient has history of Osteoarthritis Hospitalization/Surgery History - Tubal Ligation. Medical A Surgical History Notes nd Cardiovascular Morbidly Obese Objective Constitutional Hypertensive, asymptomatic. Tachycardic, asymptomatic. no acute distress. Vitals Time Taken: 7:55 AM, Height: 71 in, Weight: 352 lbs, BMI: 49.1, Temperature: 98.3 F, Pulse: 111 bpm, Respiratory Rate: 18 breaths/min, Blood Pressure: 186/84 mmHg. Respiratory Normal work of breathing on room air. LORILEI, BUFORD A ([id number]) [id number] Page 8 of 11 General Notes: 07/16/2023: The left distal lateral leg ulcer is a little bit smaller and  quite a bit cleaner. The more proximal medial left lower leg ulcer still has a fair amount of nonviable tissue present and this more tender. Her wrap did slide down and it appears that it came to rest right in the middle of this wound causing some pressure on the site. The paired right lateral lower leg ulcers are small and superficial with a little bit of slough and eschar present. Integumentary (Hair, Skin) Wound #2 status is Open. Original cause of wound was Gradually Appeared. The date acquired was: 07/01/2021. The wound has been in treatment 2 weeks. The wound is located on the Right,Lateral Lower Leg. The wound measures 3cm length x 0.4cm width x 0.1cm depth; 0.942cm^2 area and 0.094cm^3 volume. There is Fat Layer (Subcutaneous Tissue) exposed. There is no tunneling or undermining noted. There is a medium amount of serosanguineous drainage noted. The wound margin is distinct with the outline attached to the wound base. There is large (67-100%) red granulation within the wound bed. There is a small (1-33%) amount of necrotic tissue within the wound bed including Adherent Slough. The periwound skin appearance had no abnormalities noted for texture. The periwound skin appearance had no abnormalities noted for moisture. The periwound skin appearance exhibited: Hemosiderin Staining. Periwound temperature was noted as No Abnormality. Wound #3 status is Open. Original cause of wound was Gradually Appeared. The date acquired was: 04/01/2023. The wound has been in treatment 2 weeks. The wound is located on the Left,Proximal,Medial Lower Leg. The wound measures 1.2cm length x 1.3cm width x 0.2cm depth; 1.225cm^2 area and 0.245cm^3 volume. There is Fat Layer (Subcutaneous Tissue) exposed. There is no tunneling or undermining noted.  There is a medium amount of serosanguineous drainage noted. The wound margin is distinct with the outline attached to the wound base. There is medium (34-66%) red granulation within  the wound bed. There is a medium (34-66%) amount of necrotic tissue within the wound bed including Adherent Slough. The periwound skin appearance had no abnormalities noted for texture. The periwound skin appearance had no abnormalities noted for moisture. The periwound skin appearance exhibited: Hemosiderin Staining. Periwound temperature was noted as No Abnormality. Wound #6 status is Open. Original cause of wound was Gradually Appeared. The date acquired was: 08/09/2020. The wound has been in treatment 2 weeks. The wound is located on the Left,Lateral Lower Leg. The wound measures 1.4cm length x 1.5cm width x 0.2cm depth; 1.649cm^2 area and 0.33cm^3 volume. There is Fat Layer (Subcutaneous Tissue) exposed. There is no tunneling or undermining noted. There is a medium amount of serosanguineous drainage noted. The wound margin is distinct with the outline attached to the wound base. There is large (67-100%) red, pink granulation within the wound bed. There is a small (1- 33%) amount of necrotic tissue within the wound bed including Adherent Slough. The periwound skin appearance had no abnormalities noted for texture. The periwound skin appearance had no abnormalities noted for moisture. The periwound skin appearance exhibited: Hemosiderin Staining. Periwound temperature was noted as No Abnormality. Assessment Active Problems ICD-10 Non-pressure chronic ulcer of other part of right lower leg with fat layer exposed 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osed Lymphedema, not elsewhere classified Chronic diastolic (congestive) heart failure Morbid (severe) obesity due to excess calories Procedures Wound #2 Pre-procedure diagnosis of Wound #2 is a Lymphedema located on the Right,Lateral Lower Leg . There was a Selective/Open Wound Non-Viable Tissue Debridement with a total area of 0.47 sq cm performed by Duanne Guess, MD. With the following instrument(s):  Curette to remove Non-Viable tissue/material. Material removed includes Eschar and Slough and after achieving pain control using Lidocaine 4% Topical Solution. No specimens were taken. A time out was conducted at 08:15, prior to the start of the procedure. A Minimum amount of bleeding was controlled with Pressure. The procedure was tolerated well. Post Debridement Measurements: 3cm length x 0.4cm width x 0.1cm depth; 0.094cm^3 volume. Character of Wound/Ulcer Post Debridement is improved. Post procedure Diagnosis Wound #2: Same as Pre-Procedure Pre-procedure diagnosis of Wound #2 is a Lymphedema located on the Right,Lateral Lower Leg . There was a Three Layer Compression Therapy Procedure by Samuella Bruin, RN. Post procedure Diagnosis Wound #2: Same as Pre-Procedure Wound #3 Pre-procedure diagnosis of Wound #3 is a Lymphedema located on the Left,Proximal,Medial Lower Leg . There was a Excisional Skin/Subcutaneous Tissue Debridement with a total area of 1.22 sq cm performed by Duanne Guess, MD. With the following instrument(s): Curette to remove Non-Viable tissue/material. Material removed includes Subcutaneous Tissue and Slough and after achieving pain control using Lidocaine 4% T opical Solution. No specimens were taken. A time out was conducted at 08:15, prior to the start of the procedure. A Minimum amount of bleeding was controlled with Pressure. The procedure was tolerated well. Post Debridement Measurements: 1.2cm length x 1.3cm width x 0.2cm depth; 0.245cm^3 volume. Character of Wound/Ulcer Post Debridement is improved. Post procedure Diagnosis Wound #3: Same as Pre-Procedure Wound #6 Pre-procedure diagnosis of Wound #6 is a Lymphedema located on the Left,Lateral Lower Leg . There was a Selective/Open Wound Non-Viable Tissue Debridement with a total area of 1.65 sq cm performed by Duanne Guess,  MD. With the following instrument(s): Curette to remove Non-Viable tissue/material.  Material removed includes [facility] after achieving pain control using Lidocaine 4% Topical Solution. No specimens were taken. A time out was conducted at 08:15, prior to the start of the procedure. A Minimum amount of bleeding was controlled with Pressure. The procedure was tolerated well. Post Debridement Measurements: 1.4cm length x 1.5cm width x 0.2cm depth; 0.33cm^3 volume. Character of Wound/Ulcer Post Debridement is improved. Post procedure Diagnosis Wound #6: Same as Pre-Procedure Pre-procedure diagnosis of Wound #6 is a Lymphedema located on the Left,Lateral Lower Leg . There was a Three Layer Compression Therapy Procedure by Samuella Bruin, RN. Post procedure Diagnosis Wound #6: Same as Pre-Procedure SAORY, VERONA A ([id number]) [phone].pdf Page 9 of 11 Plan Follow-up Appointments: Return Appointment in 1 week. - Dr. Lady Gary Room 3 Anesthetic: (In clinic) Topical Lidocaine 4% applied to wound bed Bathing/ Shower/ Hygiene: May shower with protection but do not get wound dressing(s) wet. Protect dressing(s) with water repellant cover (for example, large plastic bag) or a cast cover and may then take shower. - Keep legs dry until next appointment. Use Cast Protectors if so desired. Other Bathing/Shower/Hygiene Orders/Instructions: - May want to sponge bath if legs cannot be kept dry until next appointment. Edema Control - Lymphedema / SCD / Other: Elevate legs to the level of the heart or above for 30 minutes daily and/or when sitting for 3-4 times a day throughout the day. Avoid standing for long periods of time. Exercise regularly - As tolerated Moisturize legs daily. Additional Orders / Instructions: Follow Nutritious Diet - Try and increase Protein intake. The goal is 70g-100g per day. Examples are Chicken,fish.meat,pork, [place] yogurt ,eggs etc. The following medication(s) was prescribed: lidocaine topical 4 % cream cream topical was prescribed at  facility WOUND #2: - Lower Leg Wound Laterality: Right, Lateral Cleanser: Soap and Water 1 x Per Week/30 Days Discharge Instructions: At Wound center-May shower and wash wound with dial antibacterial soap and water prior to dressing change. Peri-Wound Care: Triamcinolone 15 (g) 1 x Per Week/30 Days Discharge Instructions: Use triamcinolone 15 (g) as directed Peri-Wound Care: Sween Lotion (Moisturizing lotion) 1 x Per Week/30 Days Discharge Instructions: Apply moisturizing lotion as directed Secondary Dressing: Woven Gauze Sponge, Non-Sterile 4x4 in 1 x Per Week/30 Days Discharge Instructions: Apply over primary dressing as directed. Com pression Wrap: Unnaboot w/Calamine, 4x10 (in/yd) 1 x Per Week/30 Days Discharge Instructions: Apply Unnaboot as directed. Com pression Stockings: Jobst Farrow Wrap 4000 (DME) Compression Amount: 30-40 mmHg (right) Discharge Instructions: Apply Renee Pain daily as instructed. Apply first thing in the morning, remove at night before bed. WOUND #3: - Lower Leg Wound Laterality: Left, Medial, Proximal Cleanser: Soap and Water 1 x Per Week/30 Days Discharge Instructions: At Wound center-May shower and wash wound with dial antibacterial soap and water prior to dressing change. Peri-Wound Care: Triamcinolone 15 (g) 1 x Per Week/30 Days Discharge Instructions: Use triamcinolone 15 (g) as directed Peri-Wound Care: Sween Lotion (Moisturizing lotion) 1 x Per Week/30 Days Discharge Instructions: Apply moisturizing lotion as directed Prim Dressing: Hydrofera Blue Ready Transfer Foam, 4x5 (in/in) 1 x Per Week/30 Days ary Discharge Instructions: Apply to wound bed as instructed Secondary Dressing: Woven Gauze Sponge, Non-Sterile 4x4 in 1 x Per Week/30 Days Discharge Instructions: Apply over primary dressing as directed. Com pression Wrap: Unnaboot w/Calamine, 4x10 (in/yd) 1 x Per Week/30 Days Discharge Instructions: Apply Unnaboot as directed. WOUND #6: - Lower Leg  Wound Laterality: Left, Lateral Cleanser: Soap and  Water 1 x Per Week/30 Days Discharge Instructions: At Wound center-May shower and wash wound with dial antibacterial soap and water prior to dressing change. Peri-Wound Care: Triamcinolone 15 (g) 1 x Per Week/30 Days Discharge Instructions: Use triamcinolone 15 (g) as directed Peri-Wound Care: Sween Lotion (Moisturizing lotion) 1 x Per Week/30 Days Discharge Instructions: Apply moisturizing lotion as directed Prim Dressing: Hydrofera Blue Ready Transfer Foam, 4x5 (in/in) 1 x Per Week/30 Days ary Discharge Instructions: Apply to wound bed as instructed Secondary Dressing: Woven Gauze Sponge, Non-Sterile 4x4 in 1 x Per Week/30 Days Discharge Instructions: Apply over primary dressing as directed. Com pression Wrap: Unnaboot w/Calamine, 4x10 (in/yd) 1 x Per Week/30 Days Discharge Instructions: Apply Unnaboot as directed. Com pression Stockings: Jobst Farrow Wrap 4000 (DME) Compression Amount: 30-40 mmHg (left) Discharge Instructions: Apply Renee Pain daily as instructed. Apply first thing in the morning, remove at night before bed. 07/16/2023: The left distal lateral leg ulcer is a little bit smaller and quite a bit cleaner. The more proximal medial left lower leg ulcer still has a fair amount of nonviable tissue present and this more tender. Her wrap did slide down and it appears that it came to rest right in the middle of this wound causing some pressure on the site. The paired right lateral lower leg ulcers are small and superficial with a little bit of slough and eschar present. I used a curette to debride slough from the left distal leg wound, slough and subcutaneous tissue from the left proximal lower leg wound and slough and eschar from the 2 right lower leg wounds. We will continue Hydrofera Blue ready with Unna boot compression wraps. Follow-up in 1 week. Electronic Signature(s) Signed: 07/20/2023 2:17:02 PM By: Shawn Stall RN,  BSN Signed: 07/23/2023 8:12:42 AM By: Duanne Guess MD FACS Previous Signature: 07/16/2023 8:43:05 AM Version By: Duanne Guess MD FACS Entered By: Shawn Stall on 07/20/2023 09:58:49 Claude Manges A ([id number]) [id number] Page 10 of 11 -------------------------------------------------------------------------------- HxROS Details Patient Name: Date of Service: [name], [name] 07/16/2023 7:45 A M Medical Record Number: [id number] Patient Account Number: [id number] Date of Birth/Sex: Treating RN: 1966/03/04 (57 y.o. F) Primary Care Provider: PCP, [name]: Referring Provider: Treating Provider/Extender: Sherryl Manges in Treatment: 2 Information Obtained From Patient Hematologic/Lymphatic Medical History: Positive for: Anemia Respiratory Medical History: Positive for: Asthma Cardiovascular Medical History: Positive for: Congestive Heart Failure; Deep Vein Thrombosis; Hypertension Past Medical History Notes: Morbidly Obese Musculoskeletal Medical History: Positive for: Osteoarthritis Immunizations Pneumococcal Vaccine: Received Pneumococcal Vaccination: No Implantable Devices None Hospitalization / Surgery History Type of Hospitalization/Surgery Tubal Ligation Family and Social History Unknown History: Yes; Never smoker; Marital Status - Divorced; Alcohol Use: Rarely; Drug Use: No History; Caffeine Use: Daily - Tea,Coffee ,Soda; Financial Concerns: No; Food, Clothing or Shelter Needs: No; Support System Lacking: No; Transportation Concerns: No Electronic Signature(s) Signed: 07/16/2023 11:01:28 AM By: Duanne Guess MD FACS Entered By: Duanne Guess on 07/16/2023 05:41:31 -------------------------------------------------------------------------------- SuperBill Details Patient Name: Date of Service: Joan Officer, [name] 07/16/2023 Medical Record Number: [id number] Patient Account Number: [id number] Date of  Birth/Sex: Treating RN: 1965/10/17 (57 y.o. F) Primary Care Provider: PCP, [name]: Referring Provider: Treating Provider/Extender: Sherril, Zablocki, Lamar Blinks ([id number]) [id number] Page 11 of 11 Weeks in Treatment: 2 Diagnosis Coding ICD-10 Codes Code Description [phone] Non-pressure chronic ulcer of other part of right lower leg with fat layer exposed L97.822 Non-pressure chronic ulcer of other part of left lower leg with  fat layer exposed I89.0 Lymphedema, not elsewhere classified I50.32 Chronic diastolic (congestive) heart failure E66.01 Morbid (severe) obesity due to excess calories Facility Procedures : CPT4 Code: 16109604 Description: 11042 - DEB SUBQ TISSUE 20 SQ CM/< ICD-10 Diagnosis Description L97.822 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ose Modifier: d Quantity: 1 : CPT4 Code: [id number] Description: 97597 - DEBRIDE WOUND 1ST 20 SQ CM OR < ICD-10 Diagnosis Description L97.812 Non-pressure chronic ulcer of other part of right lower leg with fat layer expos L97.822 Non-pressure chronic ulcer of other part of left lower leg with fat layer  expose Modifier: ed d Quantity: 1 Physician Procedures : CPT4 Code Description Modifier [id number] 99213 - WC PHYS LEVEL 3 - EST PT 25 ICD-10 Diagnosis Description L97.812 Non-pressure chronic ulcer of other part of right lower leg with fat layer exposed L97.822 Non-pressure chronic ulcer of other part of left  lower leg with fat layer exposed I89.0 Lymphedema, not elsewhere classified I50.32 Chronic diastolic (congestive) heart failure Quantity: 1 : [id number] 11042 - WC PHYS SUBQ TISS 20 SQ CM ICD-10 Diagnosis Description L97.822 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osed Quantity: 1 : [id number] 97597 - WC PHYS DEBR WO ANESTH 20 SQ CM ICD-10 Diagnosis Description L97.812 Non-pressure chronic ulcer of other part of right lower  leg with fat layer exposed L97.822 Non-pressure chronic ulcer of other part of left lower leg with fat layer  exposed Quantity: 1 Electronic Signature(s) Signed: 07/16/2023 8:54:06 AM By: Duanne Guess MD FACS Entered By: Duanne Guess on 07/16/2023 05:54:05

## 2023-07-16 NOTE — Progress Notes (Signed)
Joan [name], Joan [name] ([id number]) [id number] Page 1 of 10 Visit Report for 07/16/2023 Arrival Information Details Patient Name: Date of Service: Joan [name], Joan [name]. 07/16/2023 7:45 [name] Medical Record Number: [id number] Patient Account Number: [id number] Date of Birth/Sex: Treating RN: 09-22-1966 (57 y.o. F) Primary Care Sidonia Nutter: PCP, [name]: Referring Kassim Guertin: Treating Zeus Marquis/Extender: Sherryl Manges in Treatment: 2 Visit Information History Since Last Visit Added or deleted any medications: No Patient Arrived: Cane Any new allergies or adverse reactions: No Arrival Time: 07:55 Had [name] fall or experienced change in No Accompanied By: self activities of daily living that may affect Transfer Assistance: None risk of falls: Patient Identification Verified: Yes Signs or symptoms of abuse/neglect since last visito No Secondary Verification Process Completed: Yes Hospitalized since last visit: No Patient Has Alerts: Yes Implantable device outside of the clinic excluding No Patient Alerts: Lasix cellular tissue based products placed in the center ABI R [place] (07/02/23) since last visit: ABI L [place] (07/02/23) Pain Present Now: No Electronic Signature(s) Signed: 07/16/2023 10:22:24 AM By: Dayton Scrape Entered By: Dayton Scrape on 07/16/2023 04:55:41 -------------------------------------------------------------------------------- Compression Therapy Details Patient Name: Date of Service: Joan Officer, Joan [name]. 07/16/2023 7:45 [name] Medical Record Number: [id number] Patient Account Number: [id number] Date of Birth/Sex: Treating RN: [birth year] (57 y.o. Joan [name] Primary Care Ora Mcnatt: PCP, [name]: Referring Radley Teston: Treating Jaquawn Saffran/Extender: Sherryl Manges in Treatment: 2 Compression Therapy Performed for Wound Assessment: Wound #2 Right,Lateral Lower Leg Performed By: Clinician Samuella Bruin, RN Compression Type: Three  Layer Post Procedure Diagnosis Same as Pre-procedure Electronic Signature(s) Signed: 07/16/2023 10:33:56 AM By: Samuella Bruin Entered By: Samuella Bruin on 07/16/2023 05:16:41 Compression Therapy Details -------------------------------------------------------------------------------- Joan [name] ([id number]) [id number] Page 2 of 10 Patient Name: Date of Service: Joan [name], Joan [name]. 07/16/2023 7:45 [name] Medical Record Number: [id number] Patient Account Number: [id number] Date of Birth/Sex: Treating RN: 1966/07/12 (57 y.o. Joan [name] Primary Care Journey Ratterman: PCP, [name]: Referring Shawntina Diffee: Treating Lily Velasquez/Extender: Sherryl Manges in Treatment: 2 Compression Therapy Performed for Wound Assessment: Wound #6 Left,Lateral Lower Leg Performed By: Clinician Samuella Bruin, RN Compression Type: Three Layer Post Procedure Diagnosis Same as Pre-procedure Electronic Signature(s) Signed: 07/16/2023 10:33:56 AM By: Gelene Mink By: Samuella Bruin on 07/16/2023 05:16:41 -------------------------------------------------------------------------------- Encounter Discharge Information Details Patient Name: Date of Service: Joan Officer, Joan [name]. 07/16/2023 7:45 [name] Medical Record Number: [id number] Patient Account Number: [id number] Date of Birth/Sex: Treating RN: 1966-06-20 (57 y.o. Joan [name] Primary Care Indica Marcott: PCP, [name]: Referring Ahnesty Finfrock: Treating Kelechi Astarita/Extender: Sherryl Manges in Treatment: 2 Encounter Discharge Information Items Post Procedure Vitals Discharge Condition: Stable Temperature (F): 98.3 Ambulatory Status: Cane Pulse (bpm): 111 Discharge Destination: Home Respiratory Rate (breaths/min): 18 Transportation: Private Auto Blood Pressure (mmHg): 186/84 Accompanied By: self Schedule Follow-up Appointment: Yes Clinical Summary of Care: Patient  Declined Electronic Signature(s) Signed: 07/16/2023 10:33:56 AM By: Samuella Bruin Entered By: Samuella Bruin on 07/16/2023 07:26:39 -------------------------------------------------------------------------------- Lower Extremity Assessment Details Patient Name: Date of Service: Joan Officer, Joan [name]. 07/16/2023 7:45 [name] Medical Record Number: [id number] Patient Account Number: [id number] Date of Birth/Sex: Treating RN: 05/09/1966 (57 y.o. Joan [name] Primary Care Sankalp Ferrell: PCP, [name]: Referring Shia Delaine: Treating Gerome Kokesh/Extender: Sherryl Manges in Treatment: 2 Edema Assessment Assessed: [Left: No] [Right: No] [Left: Edema] [Right: :] Calf Left: Right: Point of Measurement: 32 cm From Medial Instep 44.8 cm 45 cm Joan [name], Joan [name] ([id number]) [id number] Page 3 of 10 Ankle Left: Right: Point of Measurement: 10 cm From Medial Instep 31.2 cm 25.5 cm Vascular Assessment Pulses: Dorsalis Pedis Palpable: [Left:Yes] [Right:Yes] Extremity colors, hair growth, and conditions: Extremity Color: [Left:Hyperpigmented] [Right:Hyperpigmented] Hair Growth on Extremity: [Left:No] [Right:No] Temperature of Extremity: [Left:Warm] [Right:Warm] Capillary Refill: [Left:< 3 seconds] [Right:< 3 seconds] Dependent Rubor: [Left:No No] [Right:No No] Electronic Signature(s) Signed: 07/16/2023 10:33:56 AM By: Samuella Bruin Entered By: Samuella Bruin on 07/16/2023 05:01:56 -------------------------------------------------------------------------------- Multi Wound Chart Details Patient Name: Date of Service: Joan Officer, Joan [name]. 07/16/2023 7:45 [name] Medical Record Number: [id number] Patient Account Number: [id number] Date of Birth/Sex: Treating RN: 1966-03-12 (57 y.o. F) Primary Care Nakul Avino: PCP, [name]: Referring Eilene Voigt: Treating Jamichael Knotts/Extender: Sherryl Manges in Treatment: 2 Vital Signs Height(in):  71 Pulse(bpm): 111 Weight(lbs): 352 Blood Pressure(mmHg): 186/84 Body Mass Index(BMI): 49.1 Temperature(F): 98.3 Respiratory Rate(breaths/min): 18 [2:Photos:] Right, Lateral Lower Leg Left, Proximal, Medial Lower Leg Left, Lateral Lower Leg Wound Location: Gradually Appeared Gradually Appeared Gradually Appeared Wounding Event: Lymphedema Lymphedema Lymphedema Primary Etiology: Anemia, Asthma, Congestive Heart Anemia, Asthma, Congestive Heart Anemia, Asthma, Congestive Heart Comorbid History: Failure, Deep Vein Thrombosis, Failure, Deep Vein Thrombosis, Failure, Deep Vein Thrombosis, Hypertension, Osteoarthritis Hypertension, Osteoarthritis Hypertension, Osteoarthritis 07/01/2021 04/01/2023 08/09/2020 Date Acquired: 2 2 2  Weeks of Treatment: Open Open Open Wound Status: No No No Wound Recurrence: Yes No No Clustered Wound: 3x0.4x0.1 1.2x1.3x0.2 1.4x1.5x0.2 Measurements L x W x D (cm) 0.942 1.225 1.649 [name] (cm) : rea 0.094 0.245 0.33 Volume (cm) : 81.50% 68.80% 54.40% % Reduction in Area: 81.60% 79.20% 69.60% % Reduction in Volume: Full Thickness Without Exposed Full Thickness Without Exposed Full Thickness Without Exposed Classification: Support Structures Support Structures Support Structures Medium Medium Medium Exudate AmountMARITSSA, SCATURRO [name] ([id number]) [id number] Page 4 of 10 Serosanguineous Serosanguineous Serosanguineous Exudate Type: red, brown red, brown red, brown Exudate Color: Distinct, outline attached Distinct, outline attached Distinct, outline attached Wound Margin: Large (67-100%) Medium (34-66%) Large (67-100%) Granulation Amount: Red Red Red, Pink Granulation Quality: Small (1-33%) Medium (34-66%) Small (1-33%) Necrotic Amount: Fat Layer (Subcutaneous Tissue): Yes Fat Layer (Subcutaneous Tissue): Yes Fat Layer (Subcutaneous Tissue): Yes Exposed Structures: Fascia: No Fascia: No Fascia: No Tendon: No Tendon:  No Tendon: No Muscle: No Muscle: No Muscle: No Joint: No Joint: No Joint: No Bone: No Bone: No Bone: No Medium (34-66%) Small (1-33%) Small (1-33%) Epithelialization: Debridement - Selective/Open Wound Debridement - Excisional Debridement - Selective/Open Wound Debridement: Pre-procedure Verification/Time Out 08:15 08:15 08:15 Taken: Lidocaine 4% Topical Solution Lidocaine 4% Topical Solution Lidocaine 4% Topical Solution Pain Control: Necrotic/Eschar, Slough Subcutaneous, [employer] Tissue Debrided: Non-Viable Tissue Skin/Subcutaneous Tissue Non-Viable Tissue Level: 0.47 1.22 1.65 Debridement [name] (sq cm): rea Curette Curette Curette Instrument: Minimum Minimum Minimum Bleeding: Pressure Pressure Pressure Hemostasis [name] chieved: Procedure was tolerated well Procedure was tolerated well Procedure was tolerated well Debridement Treatment Response: 3x0.4x0.1 1.2x1.3x0.2 1.4x1.5x0.2 Post Debridement Measurements L x W x D (cm) 0.094 0.245 0.33 Post Debridement Volume: (cm) Scarring: Yes No Abnormalities Noted Scarring: Yes Periwound Skin Texture: No Abnormalities Noted No Abnormalities Noted No Abnormalities Noted Periwound Skin Moisture: Hemosiderin Staining: Yes Hemosiderin Staining: Yes Hemosiderin Staining: Yes Periwound Skin Color: No Abnormality No Abnormality No Abnormality Temperature: Compression Therapy Debridement Compression Therapy Procedures Performed: Debridement Debridement Treatment Notes Electronic Signature(s) Signed: 07/16/2023 8:39:23 AM By: Duanne Guess MD FACS Entered By: Duanne Guess on 07/16/2023 05:39:23 -------------------------------------------------------------------------------- Multi-Disciplinary Care Plan Details Patient Name: Date of Service: Joan Officer,  Joan [name]. 07/16/2023 7:45 [name] Medical Record Number: [id number] Patient Account Number: [id number] Date of Birth/Sex: Treating RN: 22-Jul-1966 (57 y.o. Joan [name] Primary Care Azaylea Maves: PCP, [name]: Referring Aydia Maj: Treating Maribell Demeo/Extender: Sherryl Manges in Treatment: 2 Active Inactive Wound/Skin Impairment Nursing Diagnoses: Impaired tissue integrity Goals: Patient/caregiver will verbalize understanding of skin care regimen Date Initiated: 07/02/2023 Target Resolution Date: 09/25/2023 Goal Status: Active Interventions: Assess ulceration(s) every visit Treatment Activities: Skin care regimen initiated : 07/02/2023 Notes: Joan [name], Joan [name] ([id number]) [id number] Page 5 of 10 Electronic Signature(s) Signed: 07/16/2023 10:33:56 AM By: Gelene Mink By: Samuella Bruin on 07/16/2023 05:15:45 -------------------------------------------------------------------------------- Pain Assessment Details Patient Name: Date of Service: Joan Officer, Joan [name]. 07/16/2023 7:45 [name] Medical Record Number: [id number] Patient Account Number: [id number] Date of Birth/Sex: Treating RN: 1966-03-13 (57 y.o. F) Primary Care Nessa Ramaker: PCP, [name]: Referring Shakthi Scipio: Treating Kingjames Coury/Extender: Sherryl Manges in Treatment: 2 Active Problems Location of Pain Severity and Description of Pain Patient Has Paino No Site Locations Pain Management and Medication Current Pain Management: Electronic Signature(s) Signed: 07/16/2023 10:22:24 AM By: Dayton Scrape Entered By: Dayton Scrape on 07/16/2023 04:56:10 -------------------------------------------------------------------------------- Patient/Caregiver Education Details Patient Name: Date of Service: Joan Officer, Joan [name]. 10/21/2024andnbsp7:45 [name] Medical Record Number: [id number] Patient Account Number: [id number] Date of Birth/Gender: Treating RN: 04/12/1966 (57 y.o. Joan [name] Primary Care Physician: PCP, [name]: Referring Physician: Treating Physician/Extender: Sherryl Manges in Treatment:  2 Education Assessment Education Provided To: Patient Joan [name], Joan [name] ([id number]) [id number] Page 6 of 10 Education Topics Provided Wound/Skin Impairment: Methods: Explain/Verbal Responses: Reinforcements needed, State content correctly Electronic Signature(s) Signed: 07/16/2023 10:33:56 AM By: Samuella Bruin Entered By: Samuella Bruin on 07/16/2023 05:16:19 -------------------------------------------------------------------------------- Wound Assessment Details Patient Name: Date of Service: Joan Officer, Joan [name]. 07/16/2023 7:45 [name] Medical Record Number: [id number] Patient Account Number: [id number] Date of Birth/Sex: Treating RN: 06-15-1966 (57 y.o. Joan [name] Primary Care Jacquelyn Shadrick: PCP, [name]: Referring Kynsli Haapala: Treating Algis Lehenbauer/Extender: Sherryl Manges in Treatment: 2 Wound Status Wound Number: 2 Primary Lymphedema Etiology: Wound Location: Right, Lateral Lower Leg Wound Open Wounding Event: Gradually Appeared Status: Date Acquired: 07/01/2021 Comorbid Anemia, Asthma, Congestive Heart Failure, Deep Vein Weeks Of Treatment: 2 History: Thrombosis, Hypertension, Osteoarthritis Clustered Wound: Yes Photos Wound Measurements Length: (cm) 3 Width: (cm) 0.4 Depth: (cm) 0.1 Area: (cm) 0.942 Volume: (cm) 0.094 % Reduction in Area: 81.5% % Reduction in Volume: 81.6% Epithelialization: Medium (34-66%) Tunneling: No Undermining: No Wound Description Classification: Full Thickness Without Exposed Support Structures Wound Margin: Distinct, outline attached Exudate Amount: Medium Exudate Type: Serosanguineous Exudate Color: red, brown Foul Odor After Cleansing: No Slough/Fibrino Yes Wound Bed Granulation Amount: Large (67-100%) Exposed Structure Granulation Quality: Red Fascia Exposed: No Necrotic Amount: Small (1-33%) Fat Layer (Subcutaneous Tissue) Exposed: Yes Necrotic Quality: Adherent Slough Tendon  Exposed: No Muscle Exposed: No Joint Exposed: No Bone Exposed: No Joan [name], Joan [name] ([id number]) [id number] Page 7 of 10 Periwound Skin Texture Texture Color No Abnormalities Noted: Yes No Abnormalities Noted: No Hemosiderin Staining: Yes Moisture No Abnormalities Noted: Yes Temperature / Pain Temperature: No Abnormality Treatment Notes Wound #2 (Lower Leg) Wound Laterality: Right, Lateral Cleanser Soap and Water Discharge Instruction: At Wound center-May shower and wash wound with dial antibacterial soap and water prior to dressing change. Peri-Wound Care Triamcinolone 15 (g) Discharge Instruction: Use triamcinolone 15 (g) as directed Sween Lotion (Moisturizing lotion) Discharge Instruction: Apply moisturizing lotion as directed  Topical Primary Dressing Hydrofera Blue Ready Transfer Foam, 4x5 (in/in) Discharge Instruction: Apply to wound bed as instructed Secondary Dressing Woven Gauze Sponge, Non-Sterile 4x4 in Discharge Instruction: Apply over primary dressing as directed. Secured With Compression Wrap Unnaboot w/Calamine, 4x10 (in/yd) Discharge Instruction: Apply Unnaboot as directed. Compression Stockings Add-Ons Electronic Signature(s) Signed: 07/16/2023 10:33:56 AM By: Samuella Bruin Entered By: Samuella Bruin on 07/16/2023 05:05:19 -------------------------------------------------------------------------------- Wound Assessment Details Patient Name: Date of Service: Joan Officer, Joan [name]. 07/16/2023 7:45 [name] Medical Record Number: [id number] Patient Account Number: [id number] Date of Birth/Sex: Treating RN: 10-11-1965 (57 y.o. Joan [name] Primary Care Sherrel Ploch: PCP, [name]: Referring Silvino Selman: Treating Tazaria Dlugosz/Extender: Sherryl Manges in Treatment: 2 Wound Status Wound Number: 3 Primary Lymphedema Etiology: Wound Location: Left, Proximal, Medial Lower Leg Wound Open Wounding Event: Gradually  Appeared Status: Date Acquired: 04/01/2023 Comorbid Anemia, Asthma, Congestive Heart Failure, Deep Vein Weeks Of Treatment: 2 History: Thrombosis, Hypertension, Osteoarthritis Clustered Wound: No Photos Joan [name], Joan [name] ([id number]) [id number] Page 8 of 10 Wound Measurements Length: (cm) 1.2 Width: (cm) 1.3 Depth: (cm) 0.2 Area: (cm) 1.225 Volume: (cm) 0.245 % Reduction in Area: 68.8% % Reduction in Volume: 79.2% Epithelialization: Small (1-33%) Tunneling: No Undermining: No Wound Description Classification: Full Thickness Without Exposed Suppor Wound Margin: Distinct, outline attached Exudate Amount: Medium Exudate Type: Serosanguineous Exudate Color: red, brown t Structures Foul Odor After Cleansing: No Slough/Fibrino Yes Wound Bed Granulation Amount: Medium (34-66%) Exposed Structure Granulation Quality: Red Fascia Exposed: No Necrotic Amount: Medium (34-66%) Fat Layer (Subcutaneous Tissue) Exposed: Yes Necrotic Quality: Adherent Slough Tendon Exposed: No Muscle Exposed: No Joint Exposed: No Bone Exposed: No Periwound Skin Texture Texture Color No Abnormalities Noted: Yes No Abnormalities Noted: No Hemosiderin Staining: Yes Moisture No Abnormalities Noted: Yes Temperature / Pain Temperature: No Abnormality Treatment Notes Wound #3 (Lower Leg) Wound Laterality: Left, Medial, Proximal Cleanser Soap and Water Discharge Instruction: At Wound center-May shower and wash wound with dial antibacterial soap and water prior to dressing change. Peri-Wound Care Triamcinolone 15 (g) Discharge Instruction: Use triamcinolone 15 (g) as directed Sween Lotion (Moisturizing lotion) Discharge Instruction: Apply moisturizing lotion as directed Topical Primary Dressing Hydrofera Blue Ready Transfer Foam, 4x5 (in/in) Discharge Instruction: Apply to wound bed as instructed Secondary Dressing Woven Gauze Sponge, Non-Sterile 4x4 in Discharge Instruction:  Apply over primary dressing as directed. Secured With Compression Wrap Unnaboot w/Calamine, 4x10 (in/yd) Discharge Instruction: Apply Unnaboot as directed. Joan [name], Joan [name] ([id number]) [id number] Page 9 of 10 Compression Stockings Add-Ons Electronic Signature(s) Signed: 07/16/2023 10:33:56 AM By: Gelene Mink By: Samuella Bruin on 07/16/2023 05:05:37 -------------------------------------------------------------------------------- Wound Assessment Details Patient Name: Date of Service: Joan Officer, Joan [name]. 07/16/2023 7:45 [name] Medical Record Number: [id number] Patient Account Number: [id number] Date of Birth/Sex: Treating RN: 1966-07-31 (57 y.o. Joan [name] Primary Care Shatori Bertucci: PCP, [name]: Referring Deshaun Weisinger: Treating Deaglan Lile/Extender: Sherryl Manges in Treatment: 2 Wound Status Wound Number: 6 Primary Lymphedema Etiology: Wound Location: Left, Lateral Lower Leg Wound Open Wounding Event: Gradually Appeared Status: Date Acquired: 08/09/2020 Comorbid Anemia, Asthma, Congestive Heart Failure, Deep Vein Weeks Of Treatment: 2 History: Thrombosis, Hypertension, Osteoarthritis Clustered Wound: No Photos Wound Measurements Length: (cm) 1.4 Width: (cm) 1.5 Depth: (cm) 0.2 Area: (cm) 1.649 Volume: (cm) 0.33 % Reduction in Area: 54.4% % Reduction in Volume: 69.6% Epithelialization: Small (1-33%) Tunneling: No Undermining: No Wound Description Classification: Full Thickness Without Exposed Support Structures Wound Margin: Distinct, outline attached Exudate Amount: Medium Exudate Type: Serosanguineous Exudate Color:  red, brown Foul Odor After Cleansing: No Slough/Fibrino Yes Wound Bed Granulation Amount: Large (67-100%) Exposed Structure Granulation Quality: Red, Pink Fascia Exposed: No Necrotic Amount: Small (1-33%) Fat Layer (Subcutaneous Tissue) Exposed: Yes Necrotic Quality: Adherent  Slough Tendon Exposed: No Muscle Exposed: No Joint Exposed: No Bone Exposed: No Periwound Skin Texture Texture Color No Abnormalities Noted: Yes No Abnormalities Noted: No Hemosiderin Staining: Yes Moisture Joan [name], AZOULAY [name] ([id number]) [id number].pdf Page 10 of 10 No Abnormalities Noted: Yes Temperature / Pain Temperature: No Abnormality Treatment Notes Wound #6 (Lower Leg) Wound Laterality: Left, Lateral Cleanser Soap and Water Discharge Instruction: At Wound center-May shower and wash wound with dial antibacterial soap and water prior to dressing change. Peri-Wound Care Triamcinolone 15 (g) Discharge Instruction: Use triamcinolone 15 (g) as directed Sween Lotion (Moisturizing lotion) Discharge Instruction: Apply moisturizing lotion as directed Topical Primary Dressing Hydrofera Blue Ready Transfer Foam, 4x5 (in/in) Discharge Instruction: Apply to wound bed as instructed Secondary Dressing Woven Gauze Sponge, Non-Sterile 4x4 in Discharge Instruction: Apply over primary dressing as directed. Secured With Compression Wrap Unnaboot w/Calamine, 4x10 (in/yd) Discharge Instruction: Apply Unnaboot as directed. Compression Stockings Add-Ons Electronic Signature(s) Signed: 07/16/2023 10:33:56 AM By: Samuella Bruin Entered By: Samuella Bruin on 07/16/2023 05:05:55 -------------------------------------------------------------------------------- Vitals Details Patient Name: Date of Service: Joan Officer, Joan [name]. 07/16/2023 7:45 [name] Medical Record Number: [id number] Patient Account Number: [id number] Date of Birth/Sex: Treating RN: [birth year] (57 y.o. F) Primary Care Angline Schweigert: PCP, [name]: Referring Mariateresa Batra: Treating Felica Chargois/Extender: Sherryl Manges in Treatment: 2 Vital Signs Time Taken: 07:55 Temperature (F): 98.3 Height (in): 71 Pulse (bpm): 111 Weight (lbs): 352 Respiratory Rate (breaths/min): 18 Body Mass Index (BMI):  49.1 Blood Pressure (mmHg): 186/84 Reference Range: 80 - 120 mg / dl Electronic Signature(s) Signed: 07/16/2023 10:22:24 AM By: Dayton Scrape Entered By: Dayton Scrape on 07/16/2023 04:56:04

## 2023-07-23 ENCOUNTER — Encounter (HOSPITAL_BASED_OUTPATIENT_CLINIC_OR_DEPARTMENT_OTHER): Payer: Medicare HMO | Admitting: General Surgery

## 2023-07-23 DIAGNOSIS — L97812 Non-pressure chronic ulcer of other part of right lower leg with fat layer exposed: Secondary | ICD-10-CM | POA: Diagnosis not present

## 2023-07-23 DIAGNOSIS — L97822 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osed: Secondary | ICD-10-CM | POA: Diagnosis not present

## 2023-07-23 DIAGNOSIS — Z86718 Personal history of other venous thrombosis and embolism: Secondary | ICD-10-CM | POA: Diagnosis not present

## 2023-07-23 DIAGNOSIS — I890 Lymphedema, not elsewhere classified: Secondary | ICD-10-CM | POA: Diagnosis not present

## 2023-07-23 DIAGNOSIS — I5032 Chronic diastolic (congestive) heart failure: Secondary | ICD-10-CM | POA: Diagnosis not present

## 2023-07-23 DIAGNOSIS — I110 Hypertensive heart disease with heart failure: Secondary | ICD-10-CM | POA: Diagnosis not present

## 2023-07-23 DIAGNOSIS — M1990 Unspecified osteoarthritis, unspecified site: Secondary | ICD-10-CM | POA: Diagnosis not present

## 2023-07-23 NOTE — Progress Notes (Signed)
[name], [name] ([id number]) [id number] Page 1 of 11 Visit Report for 07/23/2023 Arrival Information Details Patient Name: Date of Service: [name], [name]. 07/23/2023 10:00 [name] Medical Record Number: [id number] Patient Account Number: [id number] Date of Birth/Sex: Treating RN: 1966-05-27 (57 y.o. [name] Primary Care Monya Kozakiewicz: PCP, [name]: Referring Kammi Hechler: Treating Nanda Bittick/Extender: Sherryl Manges in Treatment: 3 Visit Information History Since Last Visit Added or deleted any medications: No Patient Arrived: Cane Any new allergies or adverse reactions: No Arrival Time: 09:55 Had [name] fall or experienced change in No Accompanied By: self activities of daily living that may affect Transfer Assistance: None risk of falls: Patient Identification Verified: Yes Signs or symptoms of abuse/neglect since last visito No Secondary Verification Process Completed: Yes Hospitalized since last visit: No Patient Has Alerts: Yes Implantable device outside of the clinic excluding No Patient Alerts: Lasix cellular tissue based products placed in the center ABI R Willows (07/02/23) since last visit: ABI L Helena (07/02/23) Has Dressing in Place as Prescribed: Yes Has Compression in Place as Prescribed: Yes Pain Present Now: No Electronic Signature(s) Signed: 07/23/2023 4:18:02 PM By: Samuella Bruin Entered By: Samuella Bruin on 07/23/2023 09:56:31 -------------------------------------------------------------------------------- Compression Therapy Details Patient Name: Date of Service: [name], [name]. 07/23/2023 10:00 [name] Medical Record Number: [id number] Patient Account Number: [id number] Date of Birth/Sex: Treating RN: 03/08/1966 (57 y.o. [name] Primary Care Birdell Frasier: PCP, [name]: Referring Willman Cuny: Treating Meloni Hinz/Extender: Sherryl Manges in Treatment: 3 Compression Therapy Performed for  Wound Assessment: Wound #2 Right,Lateral Lower Leg Performed By: Clinician Samuella Bruin, RN Compression Type: Henriette Combs Post Procedure Diagnosis Same as Pre-procedure Electronic Signature(s) Signed: 07/23/2023 4:18:02 PM By: Samuella Bruin Entered By: Samuella Bruin on 07/23/2023 10:19:37 [name] ([id number]) [id number] Page 2 of 11 -------------------------------------------------------------------------------- Compression Therapy Details Patient Name: Date of Service: [name], [name]. 07/23/2023 10:00 [name] Medical Record Number: [id number] Patient Account Number: [id number] Date of Birth/Sex: Treating RN: 03-26-1966 (57 y.o. [name] Primary Care Gari Hartsell: PCP, [name]: Referring Dimitri Shakespeare: Treating Michaelangelo Mittelman/Extender: Sherryl Manges in Treatment: 3 Compression Therapy Performed for Wound Assessment: Wound #3 Left,Proximal,Medial Lower Leg Performed By: Clinician Samuella Bruin, RN Compression Type: Henriette Combs Post Procedure Diagnosis Same as Pre-procedure Electronic Signature(s) Signed: 07/23/2023 4:18:02 PM By: Gelene Mink By: Samuella Bruin on 07/23/2023 10:19:37 -------------------------------------------------------------------------------- Encounter Discharge Information Details Patient Name: Date of Service: [name], [name]. 07/23/2023 10:00 [name] Medical Record Number: [id number] Patient Account Number: [id number] Date of Birth/Sex: Treating RN: 1966-08-19 (57 y.o. [name] Primary Care Sharlee Rufino: PCP, [name]: Referring Davan Nawabi: Treating Renell Coaxum/Extender: Sherryl Manges in Treatment: 3 Encounter Discharge Information Items Post Procedure Vitals Discharge Condition: Stable Temperature (F): 98.2 Ambulatory Status: Cane Pulse (bpm): 109 Discharge Destination: Home Respiratory Rate (breaths/min): 16 Transportation: Private Auto Blood  Pressure (mmHg): 191/102 Accompanied By: self Schedule Follow-up Appointment: Yes Clinical Summary of Care: Patient Declined Electronic Signature(s) Signed: 07/23/2023 4:18:02 PM By: Samuella Bruin Entered By: Samuella Bruin on 07/23/2023 10:38:29 -------------------------------------------------------------------------------- Lower Extremity Assessment Details Patient Name: Date of Service: [name], [name]. 07/23/2023 10:00 [name] Medical Record Number: [id number] Patient Account Number: [id number] Date of Birth/Sex: Treating RN: 1965-12-22 (57 y.o. [name] Primary Care Avian Greenawalt: PCP, [name]: Referring Bernedette Auston: Treating Isiaha Greenup/Extender: Sherryl Manges in Treatment: 3 Edema Assessment Assessed: [Left: No] [Right: No] [Left: Edema] [Right: :] Calf  [name] ([id number]) [id number] Page 3 of 11 Left: Right: Point of Measurement: 32 cm From Medial Instep 46.8 cm 52.8 cm Ankle Left: Right: Point of Measurement: 10 cm From Medial Instep 31 cm 23.5 cm Vascular Assessment Pulses: Dorsalis Pedis Palpable: [Left:Yes] [Right:Yes] Extremity colors, hair growth, and conditions: Extremity Color: [Left:Hyperpigmented] [Right:Hyperpigmented] Hair Growth on Extremity: [Left:No] [Right:No] Temperature of Extremity: [Left:Warm] [Right:Warm] Capillary Refill: [Left:< 3 seconds] [Right:< 3 seconds] Dependent Rubor: [Left:No No] [Right:No No] Electronic Signature(s) Signed: 07/23/2023 4:18:02 PM By: Samuella Bruin Entered By: Samuella Bruin on 07/23/2023 10:04:36 -------------------------------------------------------------------------------- Multi Wound Chart Details Patient Name: Date of Service: [name], [name]. 07/23/2023 10:00 [name] Medical Record Number: [id number] Patient Account Number: [id number] Date of Birth/Sex: Treating RN: [birth year] (57 y.o. F) Primary Care Riad Wagley: PCP, NO Other  Clinician: Referring Kaulder Zahner: Treating Dariana Garbett/Extender: Sherryl Manges in Treatment: 3 Vital Signs Height(in): 71 Pulse(bpm): 109 Weight(lbs): 352 Blood Pressure(mmHg): 191/102 Body Mass Index(BMI): 49.1 Temperature(F): 98.2 Respiratory Rate(breaths/min): 16 [2:Photos:] Right, Lateral Lower Leg Left, Proximal, Medial Lower Leg Left, Lateral Lower Leg Wound Location: Gradually Appeared Gradually Appeared Gradually Appeared Wounding Event: Lymphedema Lymphedema Lymphedema Primary Etiology: Anemia, Asthma, Congestive Heart Anemia, Asthma, Congestive Heart Anemia, Asthma, Congestive Heart Comorbid History: Failure, Deep Vein Thrombosis, Failure, Deep Vein Thrombosis, Failure, Deep Vein Thrombosis, Hypertension, Osteoarthritis Hypertension, Osteoarthritis Hypertension, Osteoarthritis 07/01/2021 04/01/2023 08/09/2020 Date Acquired: 3 3 3  Weeks of Treatment: Open Open Open Wound Status: No No No Wound Recurrence: Yes No No Clustered Wound: 0.2x0.2x0.1 1.1x0.9x0.2 1.6x1.5x0.2 Measurements L x W x D (cm) 0.031 0.778 1.885 [name] (cm) : rea 0.003 0.156 0.377 Volume (cm) : 99.40% 80.20% 47.80% % Reduction in Area: 99.40% 86.80% 65.20% % Reduction in VolumeDOMANIQUE, [name] ([id number]) [id number] Page 4 of 11 Full Thickness Without Exposed Full Thickness Without Exposed Full Thickness Without Exposed Classification: Support Structures Support Structures Support Structures Medium Medium Medium Exudate [name] mount: Serosanguineous Serosanguineous Serosanguineous Exudate Type: red, brown red, brown red, brown Exudate Color: Distinct, outline attached Distinct, outline attached Distinct, outline attached Wound Margin: Large (67-100%) Medium (34-66%) Large (67-100%) Granulation [name] mount: Red Red Red, Pink Granulation Quality: Small (1-33%) Medium (34-66%) Small (1-33%) Necrotic [name] mount: Fat Layer (Subcutaneous Tissue): Yes Fat Layer (Subcutaneous  Tissue): Yes Fat Layer (Subcutaneous Tissue): Yes Exposed Structures: Fascia: No Fascia: No Fascia: No Tendon: No Tendon: No Tendon: No Muscle: No Muscle: No Muscle: No Joint: No Joint: No Joint: No Bone: No Bone: No Bone: No Large (67-100%) Small (1-33%) Small (1-33%) Epithelialization: Debridement - Selective/Open Wound Debridement - Excisional Debridement - Excisional Debridement: Pre-procedure Verification/Time Out 10:16 10:16 10:16 Taken: Lidocaine 4% Topical Solution Lidocaine 4% Topical Solution Lidocaine 4% Topical Solution Pain Control: Necrotic/Eschar Subcutaneous, Slough Subcutaneous, Slough Tissue Debrided: Non-Viable Tissue Skin/Subcutaneous Tissue Skin/Subcutaneous Tissue Level: 0.03 0.78 1.88 Debridement [name] (sq cm): rea Curette Curette Curette Instrument: Minimum Minimum Minimum Bleeding: Pressure Pressure Pressure Hemostasis [name] chieved: Procedure was tolerated well Procedure was tolerated well Procedure was tolerated well Debridement Treatment Response: 0.2x0.2x0.1 1.1x0.9x0.2 1.6x1.5x0.2 Post Debridement Measurements L x W x D (cm) 0.003 0.156 0.377 Post Debridement Volume: (cm) Scarring: Yes No Abnormalities Noted Scarring: Yes Periwound Skin Texture: No Abnormalities Noted No Abnormalities Noted No Abnormalities Noted Periwound Skin Moisture: Hemosiderin Staining: Yes Hemosiderin Staining: Yes Hemosiderin Staining: Yes Periwound Skin Color: No Abnormality No Abnormality No Abnormality Temperature: Compression Therapy Compression Therapy Debridement Procedures Performed: Debridement Debridement Treatment Notes Electronic Signature(s) Signed: 07/23/2023 10:22:42 AM By: Duanne Guess MD FACS Entered By:  Duanne Guess on 07/23/2023 10:22:42 -------------------------------------------------------------------------------- Multi-Disciplinary Care Plan Details Patient Name: Date of Service: [name], [name]. 07/23/2023 10:00 [name]  [name] Medical Record Number: [id number] Patient Account Number: [id number] Date of Birth/Sex: Treating RN: 1966-03-07 (57 y.o. [name] Primary Care Britt Petroni: PCP, [name]: Referring Kataleia Quaranta: Treating Fritz Cauthon/Extender: Sherryl Manges in Treatment: 3 Active Inactive Wound/Skin Impairment Nursing Diagnoses: Impaired tissue integrity Goals: Patient/caregiver will verbalize understanding of skin care regimen Date Initiated: 07/02/2023 Target Resolution Date: 09/25/2023 Goal Status: Active Interventions: Assess ulceration(s) every visit Treatment Activities: Skin care regimen initiated : 07/02/2023 [name], [name] ([id number]) [id number] Page 5 of 11 Notes: Electronic Signature(s) Signed: 07/23/2023 4:18:02 PM By: Samuella Bruin Entered By: Samuella Bruin on 07/23/2023 10:19:11 -------------------------------------------------------------------------------- Pain Assessment Details Patient Name: Date of Service: [name], [name]. 07/23/2023 10:00 [name] Medical Record Number: [id number] Patient Account Number: [id number] Date of Birth/Sex: Treating RN: [birth year] (57 y.o. [name] Primary Care Neo Yepiz: PCP, [name]: Referring Edy Mcbane: Treating Chianna Spirito/Extender: Sherryl Manges in Treatment: 3 Active Problems Location of Pain Severity and Description of Pain Patient Has Paino No Site Locations Rate the pain. Current Pain Level: 0 Pain Management and Medication Current Pain Management: Electronic Signature(s) Signed: 07/23/2023 4:18:02 PM By: Samuella Bruin Entered By: Samuella Bruin on 07/23/2023 09:56:38 -------------------------------------------------------------------------------- Patient/Caregiver Education Details Patient Name: Date of Service: [name], [name]. 10/28/2024andnbsp10:00 [name] Medical Record Number: [id number] Patient Account Number: [id number] Date of  Birth/Gender: Treating RN: October 01, 1965 (57 y.o. [name] Primary Care Physician: PCP, [name]: Referring Physician: Treating Physician/Extender: Sherryl Manges in Treatment: 3 Education Assessment [name] ([id number]) [id number] Page 6 of 11 Education Provided To: Patient Education Topics Provided Wound/Skin Impairment: Methods: Explain/Verbal Responses: Reinforcements needed, State content correctly Electronic Signature(s) Signed: 07/23/2023 4:18:02 PM By: Samuella Bruin Entered By: Samuella Bruin on 07/23/2023 10:19:23 -------------------------------------------------------------------------------- Wound Assessment Details Patient Name: Date of Service: [name], [name]. 07/23/2023 10:00 [name] Medical Record Number: [id number] Patient Account Number: [id number] Date of Birth/Sex: Treating RN: 1966/06/05 (57 y.o. [name] Primary Care Yaslyn Cumby: PCP, [name]: Referring Willia Genrich: Treating Jahnaya Branscome/Extender: Sherryl Manges in Treatment: 3 Wound Status Wound Number: 2 Primary Lymphedema Etiology: Wound Location: Right, Lateral Lower Leg Wound Open Wounding Event: Gradually Appeared Status: Date Acquired: 07/01/2021 Comorbid Anemia, Asthma, Congestive Heart Failure, Deep Vein Weeks Of Treatment: 3 History: Thrombosis, Hypertension, Osteoarthritis Clustered Wound: Yes Photos Wound Measurements Length: (cm) 0.2 Width: (cm) 0.2 Depth: (cm) 0.1 Area: (cm) 0.031 Volume: (cm) 0.003 % Reduction in Area: 99.4% % Reduction in Volume: 99.4% Epithelialization: Large (67-100%) Tunneling: No Undermining: No Wound Description Classification: Full Thickness Without Exposed Support Structures Wound Margin: Distinct, outline attached Exudate Amount: Medium Exudate Type: Serosanguineous Exudate Color: red, brown Foul Odor After Cleansing: No Slough/Fibrino Yes Wound  Bed Granulation Amount: Large (67-100%) Exposed Structure Granulation Quality: Red Fascia Exposed: No Necrotic Amount: Small (1-33%) Fat Layer (Subcutaneous Tissue) Exposed: Yes Necrotic Quality: Adherent Slough Tendon Exposed: No Muscle Exposed: No [name], [name] ([id number]) [id number] Page 7 of 11 Joint Exposed: No Bone Exposed: No Periwound Skin Texture Texture Color No Abnormalities Noted: Yes No Abnormalities Noted: No Hemosiderin Staining: Yes Moisture No Abnormalities Noted: Yes Temperature / Pain Temperature: No Abnormality Treatment Notes Wound #2 (Lower Leg) Wound Laterality: Right, Lateral Cleanser Soap and Water Discharge Instruction: At Wound center-May shower and wash wound with dial antibacterial soap and water prior  to dressing change. Peri-Wound Care Triamcinolone 15 (g) Discharge Instruction: Use triamcinolone 15 (g) as directed Sween Lotion (Moisturizing lotion) Discharge Instruction: Apply moisturizing lotion as directed Topical Primary Dressing Hydrofera Blue Ready Transfer Foam, 2.5x2.5 (in/in) Discharge Instruction: Apply directly to wound bed as directed Secondary Dressing Woven Gauze Sponge, Non-Sterile 4x4 in Discharge Instruction: Apply over primary dressing as directed. Secured With Compression Wrap Unnaboot w/Calamine, 4x10 (in/yd) Discharge Instruction: Apply Unnaboot as directed. Compression Stockings Jobst Farrow Wrap 4000 Quantity: 1 Right Leg Compression Amount: 30-40 mmHg Discharge Instruction: Apply Renee Pain daily as instructed. Apply first thing in the morning, remove at night before bed. Add-Ons Electronic Signature(s) Signed: 07/23/2023 4:18:02 PM By: Samuella Bruin Entered By: Samuella Bruin on 07/23/2023 10:12:54 -------------------------------------------------------------------------------- Wound Assessment Details Patient Name: Date of Service: [name], [name]. 07/23/2023 10:00 [name]  [name] Medical Record Number: [id number] Patient Account Number: [id number] Date of Birth/Sex: Treating RN: Jan 29, 1966 (57 y.o. [name] Primary Care Annarose Ouellet: PCP, [name]: Referring Neriah Brott: Treating Yalanda Soderman/Extender: Sherryl Manges in Treatment: 3 Wound Status Wound Number: 3 Primary Lymphedema Etiology: Wound Location: Left, Proximal, Medial Lower Leg Wound Open Wounding Event: Gradually Appeared Status: Date Acquired: 04/01/2023 Comorbid Anemia, Asthma, Congestive Heart Failure, Deep Vein Weeks Of Treatment: 3 [name], [name] ([id number]) [id number] Page 8 of 11 Weeks Of Treatment: 3 History: Thrombosis, Hypertension, Osteoarthritis Clustered Wound: No Photos Wound Measurements Length: (cm) 1.1 Width: (cm) 0.9 Depth: (cm) 0.2 Area: (cm) 0.778 Volume: (cm) 0.156 % Reduction in Area: 80.2% % Reduction in Volume: 86.8% Epithelialization: Small (1-33%) Tunneling: No Undermining: No Wound Description Classification: Full Thickness Without Exposed Support Structures Wound Margin: Distinct, outline attached Exudate Amount: Medium Exudate Type: Serosanguineous Exudate Color: red, brown Foul Odor After Cleansing: No Slough/Fibrino Yes Wound Bed Granulation Amount: Medium (34-66%) Exposed Structure Granulation Quality: Red Fascia Exposed: No Necrotic Amount: Medium (34-66%) Fat Layer (Subcutaneous Tissue) Exposed: Yes Necrotic Quality: Adherent Slough Tendon Exposed: No Muscle Exposed: No Joint Exposed: No Bone Exposed: No Periwound Skin Texture Texture Color No Abnormalities Noted: Yes No Abnormalities Noted: No Hemosiderin Staining: Yes Moisture No Abnormalities Noted: Yes Temperature / Pain Temperature: No Abnormality Treatment Notes Wound #3 (Lower Leg) Wound Laterality: Left, Medial, Proximal Cleanser Soap and Water Discharge Instruction: At Wound center-May shower and wash wound with dial antibacterial  soap and water prior to dressing change. Peri-Wound Care Triamcinolone 15 (g) Discharge Instruction: Use triamcinolone 15 (g) as directed Sween Lotion (Moisturizing lotion) Discharge Instruction: Apply moisturizing lotion as directed Topical Primary Dressing Hydrofera Blue Ready Transfer Foam, 4x5 (in/in) Discharge Instruction: Apply to wound bed as instructed Secondary Dressing Woven Gauze Sponge, Non-Sterile 4x4 in Discharge Instruction: Apply over primary dressing as directed. Secured With Compression [name], [name] ([id number]) [id number] Page 9 of 11 Unnaboot w/Calamine, 4x10 (in/yd) Discharge Instruction: Apply Unnaboot as directed. Compression Stockings Add-Ons Electronic Signature(s) Signed: 07/23/2023 4:18:02 PM By: Samuella Bruin Entered By: Samuella Bruin on 07/23/2023 10:13:14 -------------------------------------------------------------------------------- Wound Assessment Details Patient Name: Date of Service: [name], [name]. 07/23/2023 10:00 [name] Medical Record Number: [id number] Patient Account Number: [id number] Date of Birth/Sex: Treating RN: 1965-11-24 (57 y.o. [name] Primary Care Dechelle Attaway: PCP, [name]: Referring Sendy Pluta: Treating Adisynn Suleiman/Extender: Sherryl Manges in Treatment: 3 Wound Status Wound Number: 6 Primary Lymphedema Etiology: Wound Location: Left, Lateral Lower Leg Wound Open Wounding Event: Gradually Appeared Status: Date Acquired: 08/09/2020 Comorbid Anemia, Asthma, Congestive Heart Failure, Deep Vein Weeks Of Treatment: 3 History: Thrombosis,  Hypertension, Osteoarthritis Clustered Wound: No Photos Wound Measurements Length: (cm) 1.6 Width: (cm) 1.5 Depth: (cm) 0.2 Area: (cm) 1.885 Volume: (cm) 0.377 % Reduction in Area: 47.8% % Reduction in Volume: 65.2% Epithelialization: Small (1-33%) Tunneling: No Undermining: No Wound Description Classification:  Full Thickness Without Exposed Suppor Wound Margin: Distinct, outline attached Exudate Amount: Medium Exudate Type: Serosanguineous Exudate Color: red, brown t Structures Foul Odor After Cleansing: No Slough/Fibrino Yes Wound Bed Granulation Amount: Large (67-100%) Exposed Structure Granulation Quality: Red, Pink Fascia Exposed: No Necrotic Amount: Small (1-33%) Fat Layer (Subcutaneous Tissue) Exposed: Yes Necrotic Quality: Adherent Slough Tendon Exposed: No Muscle Exposed: No Joint Exposed: No Bone Exposed: No Periwound Skin Texture [name], [name] ([id number]) [id number] Page 10 of 11 Texture Color No Abnormalities Noted: Yes No Abnormalities Noted: No Hemosiderin Staining: Yes Moisture No Abnormalities Noted: Yes Temperature / Pain Temperature: No Abnormality Treatment Notes Wound #6 (Lower Leg) Wound Laterality: Left, Lateral Cleanser Soap and Water Discharge Instruction: At Wound center-May shower and wash wound with dial antibacterial soap and water prior to dressing change. Peri-Wound Care Triamcinolone 15 (g) Discharge Instruction: Use triamcinolone 15 (g) as directed Sween Lotion (Moisturizing lotion) Discharge Instruction: Apply moisturizing lotion as directed Topical Primary Dressing Hydrofera Blue Ready Transfer Foam, 4x5 (in/in) Discharge Instruction: Apply to wound bed as instructed Secondary Dressing Woven Gauze Sponge, Non-Sterile 4x4 in Discharge Instruction: Apply over primary dressing as directed. Secured With Compression Wrap Unnaboot w/Calamine, 4x10 (in/yd) Discharge Instruction: Apply Unnaboot as directed. Compression Stockings Jobst Farrow Wrap 4000 Quantity: 1 Left Leg Compression Amount: 30-40 mmHg Discharge Instruction: Apply Renee Pain daily as instructed. Apply first thing in the morning, remove at night before bed. Add-Ons Electronic Signature(s) Signed: 07/23/2023 4:18:02 PM By: Samuella Bruin Entered  By: Samuella Bruin on 07/23/2023 10:13:36 -------------------------------------------------------------------------------- Vitals Details Patient Name: Date of Service: [name], [name]. 07/23/2023 10:00 [name] Medical Record Number: [id number] Patient Account Number: [id number] Date of Birth/Sex: Treating RN: July 27, 1966 (57 y.o. [name] Primary Care Cyle Kenyon: PCP, [name]: Referring Eliasar Hlavaty: Treating Arthelia Callicott/Extender: Sherryl Manges in Treatment: 3 Vital Signs Time Taken: 10:03 Temperature (F): 98.2 Height (in): 71 Pulse (bpm): 109 Weight (lbs): 352 Respiratory Rate (breaths/min): 16 Body Mass Index (BMI): 49.1 Blood Pressure (mmHg): 191/102 Reference Range: 80 - 120 mg / dl Notes [name], CARCHI [name] ([id number]) [id number] Page 11 of 11 pt did not take her BP med Electronic Signature(s) Signed: 07/23/2023 4:18:02 PM By: Samuella Bruin Entered By: Samuella Bruin on 07/23/2023 10:03:27

## 2023-07-23 NOTE — Progress Notes (Signed)
[name], Joan Joan ([id number]) [id number] Page 1 of 11 Visit Report for 07/23/2023 Chief Complaint Document Details Patient Name: Date of Service: [name], [place] RY Joan. 07/23/2023 10:00 Joan M Medical Record Number: [id number] Patient Account Number: [id number] Date of Birth/Sex: Treating RN: Jul 02, 1966 (57 y.o. F) Primary Care Provider: PCP, [name]: Referring Provider: Treating Provider/Extender: Sherryl Manges in Treatment: 3 Information Obtained from: Patient Chief Complaint Patient presents for treatment of open ulcers due to lymphedema Electronic Signature(s) Signed: 07/23/2023 10:22:48 AM By: Duanne Guess MD FACS Entered By: Duanne Guess on 07/23/2023 10:22:48 -------------------------------------------------------------------------------- Debridement Details Patient Name: Date of Service: Joan Officer, [name] 07/23/2023 10:00 Joan M Medical Record Number: [id number] Patient Account Number: [id number] Date of Birth/Sex: Treating RN: 01-18-1966 (57 y.o. Fredderick Phenix Primary Care Provider: PCP, [name]: Referring Provider: Treating Provider/Extender: Sherryl Manges in Treatment: 3 Debridement Performed for Assessment: Wound #2 Right,Lateral Lower Leg Performed By: Physician Duanne Guess, MD The following information was scribed by: Samuella Bruin The information was scribed for: Duanne Guess Debridement Type: Debridement Level of Consciousness (Pre-procedure): Awake and Alert Pre-procedure Verification/Time Out Yes - 10:16 Taken: Start Time: 10:16 Pain Control: Lidocaine 4% Topical Solution Percent of Wound Bed Debrided: 100% T Area Debrided (cm): otal 0.03 Tissue and other material debrided: Non-Viable, Eschar Level: Non-Viable Tissue Debridement Description: Selective/Open Wound Instrument: Curette Bleeding: Minimum Hemostasis Achieved: Pressure Response to Treatment: Procedure was  tolerated well Level of Consciousness (Post- Awake and Alert procedure): Post Debridement Measurements of Total Wound Length: (cm) 0.2 Width: (cm) 0.2 Depth: (cm) 0.1 Volume: (cm) 0.003 Character of Wound/Ulcer Post Debridement: Improved Post Procedure Diagnosis QUIARA, KORZENIEWSKI Joan ([id number]) [id number] Page 2 of 11 Same as Pre-procedure Electronic Signature(s) Signed: 07/23/2023 10:36:36 AM By: Duanne Guess MD FACS Signed: 07/23/2023 4:18:02 PM By: Samuella Bruin Entered By: Samuella Bruin on 07/23/2023 10:18:12 -------------------------------------------------------------------------------- Debridement Details Patient Name: Date of Service: Joan Officer, [name] 07/23/2023 10:00 Joan M Medical Record Number: [id number] Patient Account Number: [id number] Date of Birth/Sex: Treating RN: [birth year] (57 y.o. Fredderick Phenix Primary Care Provider: PCP, [name]: Referring Provider: Treating Provider/Extender: Sherryl Manges in Treatment: 3 Debridement Performed for Assessment: Wound #3 Left,Proximal,Medial Lower Leg Performed By: Physician Duanne Guess, MD The following information was scribed by: Samuella Bruin The information was scribed for: Duanne Guess Debridement Type: Debridement Level of Consciousness (Pre-procedure): Awake and Alert Pre-procedure Verification/Time Out Yes - 10:16 Taken: Start Time: 10:16 Pain Control: Lidocaine 4% T opical Solution Percent of Wound Bed Debrided: 100% T Area Debrided (cm): otal 0.78 Tissue and other material debrided: Non-Viable, Slough, Subcutaneous, Slough Level: Skin/Subcutaneous Tissue Debridement Description: Excisional Instrument: Curette Bleeding: Minimum Hemostasis Achieved: Pressure Response to Treatment: Procedure was tolerated well Level of Consciousness (Post- Awake and Alert procedure): Post Debridement Measurements of Total Wound Length: (cm)  1.1 Width: (cm) 0.9 Depth: (cm) 0.2 Volume: (cm) 0.156 Character of Wound/Ulcer Post Debridement: Improved Post Procedure Diagnosis Same as Pre-procedure Electronic Signature(s) Signed: 07/23/2023 10:36:36 AM By: Duanne Guess MD FACS Signed: 07/23/2023 4:18:02 PM By: Samuella Bruin Entered By: Samuella Bruin on 07/23/2023 10:18:43 -------------------------------------------------------------------------------- Debridement Details Patient Name: Date of Service: Joan Officer, [name] 07/23/2023 10:00 Joan M Medical Record Number: [id number] Patient Account Number: [id number] Date of Birth/Sex: Treating RN: Mar 01, 1966 (57 y.o. Fredderick Phenix Primary Care Provider: PCP, [name]: [name] ([id number]) [id number] Page 3 of 11 Referring Provider: Treating Provider/Extender: Sherryl Manges in  Treatment: 3 Debridement Performed for Assessment: Wound #6 Left,Lateral Lower Leg Performed By: Physician Duanne Guess, MD The following information was scribed by: Samuella Bruin The information was scribed for: Duanne Guess Debridement Type: Debridement Level of Consciousness (Pre-procedure): Awake and Alert Pre-procedure Verification/Time Out Yes - 10:16 Taken: Start Time: 10:16 Pain Control: Lidocaine 4% T opical Solution Percent of Wound Bed Debrided: 100% T Area Debrided (cm): otal 1.88 Tissue and other material debrided: Non-Viable, Slough, Subcutaneous, Slough Level: Skin/Subcutaneous Tissue Debridement Description: Excisional Instrument: Curette Bleeding: Minimum Hemostasis Achieved: Pressure Response to Treatment: Procedure was tolerated well Level of Consciousness (Post- Awake and Alert procedure): Post Debridement Measurements of Total Wound Length: (cm) 1.6 Width: (cm) 1.5 Depth: (cm) 0.2 Volume: (cm) 0.377 Character of Wound/Ulcer Post Debridement: Improved Post Procedure Diagnosis Same as  Pre-procedure Electronic Signature(s) Signed: 07/23/2023 10:36:36 AM By: Duanne Guess MD FACS Signed: 07/23/2023 4:18:02 PM By: Samuella Bruin Entered By: Samuella Bruin on 07/23/2023 10:19:51 -------------------------------------------------------------------------------- HPI Details Patient Name: Date of Service: Joan Officer, [name] 07/23/2023 10:00 Joan M Medical Record Number: [id number] Patient Account Number: [id number] Date of Birth/Sex: Treating RN: 1966-08-05 (57 y.o. F) Primary Care Provider: PCP, [name]: Referring Provider: Treating Provider/Extender: Sherryl Manges in Treatment: 3 History of Present Illness HPI Description: ADMISSION 07/02/2023 ***ABIs non-compressible bilaterally; triphasic Doppler signals*** This is Joan 57 year old morbidly obese patient who is not Joan diabetic. She does have hypertension and CHF. Although she does not carry this diagnosis in her electronic medical record, she clearly has lymphedema. She has multiple wounds on her bilateral lower extremities, 1 of which has been present for 3 years. She has been treating her wounds with peroxide and bacitracin. Her provider has given her various courses of antibiotics, but the wounds have not closed. She was told not to wear compression stockings due to her history of DVT but this was well over Joan year ago. She has never worn lymphedema pumps. , 07/09/2023: Several of the smaller wounds have closed. Remaining wounds include the left proximal medial ulcer and the left lateral distal ulcer, as well as the paired wounds on her right lateral lower leg. There is slough accumulation at all sites. 07/16/2023: The left distal lateral leg ulcer is Joan little bit smaller and quite Joan bit cleaner. The more proximal medial left lower leg ulcer still has Joan fair amount of nonviable tissue present and this more tender. Her wrap did slide down and it appears that it came to rest right in the middle of this  wound causing some pressure on the site. The paired right lateral lower leg ulcers are small and superficial with Joan little bit of slough and eschar present. 07/23/2023: The right lateral lower leg ulcers are nearly closed but remain just barely open underneath Joan layer of eschar. The proximal medial left lower leg ulcer has less nonviable tissue present today and is smaller. The left distal lateral leg ulcer is unchanged in size, but the surface is clean and the quality of the tissue is improving. LYSSA, OSORNO Joan ([id number]) [id number] Page 4 of 11 Electronic Signature(s) Signed: 07/23/2023 10:23:38 AM By: Duanne Guess MD FACS Entered By: Duanne Guess on 07/23/2023 10:23:38 -------------------------------------------------------------------------------- Physical Exam Details Patient Name: Date of Service: Joan Officer, [name] 07/23/2023 10:00 Joan M Medical Record Number: [id number] Patient Account Number: [id number] Date of Birth/Sex: Treating RN: 11/26/1965 (57 y.o. F) Primary Care Provider: PCP, [name]: Referring Provider: Treating Provider/Extender: Sherryl Manges in Treatment:  3 Constitutional Hypertensive, asymptomatic. Slightly tachycardic. . . no acute distress. Respiratory Normal work of breathing on room air.. Notes 07/23/2023: The right lateral lower leg ulcers are nearly closed but remain just barely open underneath Joan layer of eschar. The proximal medial left lower leg ulcer has less nonviable tissue present today and is smaller. The left distal lateral leg ulcer is unchanged in size, but the surface is clean and the quality of the tissue is improving. Electronic Signature(s) Signed: 07/23/2023 10:29:08 AM By: Duanne Guess MD FACS Previous Signature: 07/23/2023 10:24:05 AM Version By: Duanne Guess MD FACS Entered By: Duanne Guess on 07/23/2023  10:29:08 -------------------------------------------------------------------------------- Physician Orders Details Patient Name: Date of Service: Joan Officer, [name] 07/23/2023 10:00 Joan M Medical Record Number: [id number] Patient Account Number: [id number] Date of Birth/Sex: Treating RN: 01-19-1966 (57 y.o. Fredderick Phenix Primary Care Provider: PCP, [name]: Referring Provider: Treating Provider/Extender: Sherryl Manges in Treatment: 3 The following information was scribed by: Samuella Bruin The information was scribed for: Duanne Guess Verbal / Phone Orders: No Diagnosis Coding ICD-10 Coding Code Description [phone] Non-pressure chronic ulcer of other part of right lower leg with fat layer exposed L97.822 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osed I89.0 Lymphedema, not elsewhere classified I50.32 Chronic diastolic (congestive) heart failure E66.01 Morbid (severe) obesity due to excess calories Follow-up Appointments ppointment in 1 week. - Dr. Lady Gary Room 3 Return Joan Anesthetic JANNAH, RIAN Joan ([id number]) [id number] Page 5 of 11 (In clinic) Topical Lidocaine 4% applied to wound bed Bathing/ Shower/ Hygiene May shower with protection but do not get wound dressing(s) wet. Protect dressing(s) with water repellant cover (for example, large plastic bag) or Joan cast cover and may then take shower. - Keep legs dry until next appointment. Use Cast Protectors if so desired. Other Bathing/Shower/Hygiene Orders/Instructions: - May want to sponge bath if legs cannot be kept dry until next appointment. Additional Orders / Instructions Follow Nutritious Diet - Try and increase Protein intake. The goal is 70g-100g per day. Examples are Chicken,fish.meat,pork, [place] yogurt ,eggs etc. Lymphedema Treatment Plan - Exercise, Compression and Elevation Exercise daily as tolerated. (Walking, ROM, Calf Pumps  and Toe Taps) Compression Wraps as ordered Elevate legs 30 - 60 minutes at or above heart level at least 3 - 4 times daily as able/tolerated Avoid standing for long periods and elevate leg(s) parallel to the floor when sitting Wound Treatment Wound #2 - Lower Leg Wound Laterality: Right, Lateral Cleanser: Soap and Water 1 x Per Week/30 Days Discharge Instructions: At Wound center-May shower and wash wound with dial antibacterial soap and water prior to dressing change. Peri-Wound Care: Triamcinolone 15 (g) 1 x Per Week/30 Days Discharge Instructions: Use triamcinolone 15 (g) as directed Peri-Wound Care: Sween Lotion (Moisturizing lotion) 1 x Per Week/30 Days Discharge Instructions: Apply moisturizing lotion as directed Prim Dressing: Hydrofera Blue Ready Transfer Foam, 2.5x2.5 (in/in) 1 x Per Week/30 Days ary Discharge Instructions: Apply directly to wound bed as directed Secondary Dressing: Woven Gauze Sponge, Non-Sterile 4x4 in 1 x Per Week/30 Days Discharge Instructions: Apply over primary dressing as directed. Compression Wrap: Unnaboot w/Calamine, 4x10 (in/yd) 1 x Per Week/30 Days Discharge Instructions: Apply Unnaboot as directed. Compression Stockings: Jobst Farrow Wrap 4000 Right Leg Compression Amount: 30-40 mmHG Discharge Instructions: Apply Renee Pain daily as instructed. Apply first thing in the morning, remove at night before bed. Wound #3 - Lower Leg Wound Laterality: Left, Medial, Proximal Cleanser: Soap  and Water 1 x Per Week/30 Days Discharge Instructions: At Wound center-May shower and wash wound with dial antibacterial soap and water prior to dressing change. Peri-Wound Care: Triamcinolone 15 (g) 1 x Per Week/30 Days Discharge Instructions: Use triamcinolone 15 (g) as directed Peri-Wound Care: Sween Lotion (Moisturizing lotion) 1 x Per Week/30 Days Discharge Instructions: Apply moisturizing lotion as directed Prim Dressing: Hydrofera Blue Ready Transfer Foam, 4x5  (in/in) 1 x Per Week/30 Days ary Discharge Instructions: Apply to wound bed as instructed Secondary Dressing: Woven Gauze Sponge, Non-Sterile 4x4 in 1 x Per Week/30 Days Discharge Instructions: Apply over primary dressing as directed. Compression Wrap: Unnaboot w/Calamine, 4x10 (in/yd) 1 x Per Week/30 Days Discharge Instructions: Apply Unnaboot as directed. Wound #6 - Lower Leg Wound Laterality: Left, Lateral Cleanser: Soap and Water 1 x Per Week/30 Days Discharge Instructions: At Wound center-May shower and wash wound with dial antibacterial soap and water prior to dressing change. Peri-Wound Care: Triamcinolone 15 (g) 1 x Per Week/30 Days Discharge Instructions: Use triamcinolone 15 (g) as directed Peri-Wound Care: Sween Lotion (Moisturizing lotion) 1 x Per Week/30 Days Discharge Instructions: Apply moisturizing lotion as directed Prim Dressing: Hydrofera Blue Ready Transfer Foam, 4x5 (in/in) 1 x Per Week/30 Days ary Discharge Instructions: Apply to wound bed as instructed Secondary Dressing: Woven Gauze Sponge, Non-Sterile 4x4 in 1 x Per Week/30 Days Discharge Instructions: Apply over primary dressing as directed. Compression Wrap: Unnaboot w/Calamine, 4x10 (in/yd) 1 x Per Week/30 Days Discharge Instructions: Apply Unnaboot as directed. MINERVIA, BRILLA Joan ([id number]) [id number] Page 6 of 11 Compression Stockings: Jobst Farrow Wrap 4000 Left Leg Compression Amount: 30-40 mmHG Discharge Instructions: Apply Renee Pain daily as instructed. Apply first thing in the morning, remove at night before bed. Patient Medications llergies: No Known Allergies Joan Notifications Medication Indication Start End 07/23/2023 lidocaine DOSE topical 4 % cream - cream topical Electronic Signature(s) Signed: 07/23/2023 10:36:36 AM By: Duanne Guess MD FACS Entered By: Duanne Guess on 07/23/2023  10:29:20 -------------------------------------------------------------------------------- Problem List Details Patient Name: Date of Service: Joan Officer, [name] 07/23/2023 10:00 Joan M Medical Record Number: [id number] Patient Account Number: [id number] Date of Birth/Sex: Treating RN: November 08, 1965 (57 y.o. F) Primary Care Provider: PCP, [name]: Referring Provider: Treating Provider/Extender: Sherryl Manges in Treatment: 3 Active Problems ICD-10 Encounter Code Description Active Date MDM Diagnosis L97.812 Non-pressure chronic ulcer of other part of right lower leg with fat layer 07/02/2023 No Yes exposed L97.822 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osed10/03/2023 No Yes I89.0 Lymphedema, not elsewhere classified 07/02/2023 No Yes I50.32 Chronic diastolic (congestive) heart failure 07/02/2023 No Yes E66.01 Morbid (severe) obesity due to excess calories 07/02/2023 No Yes Inactive Problems Resolved Problems Electronic Signature(s) Signed: 07/23/2023 10:21:42 AM By: Duanne Guess MD FACS Entered By: Duanne Guess on 07/23/2023 10:21:42 [name] ([id number]) [id number] Page 7 of 11 -------------------------------------------------------------------------------- Progress Note Details Patient Name: Date of Service: [name], [place] RY Joan. 07/23/2023 10:00 Joan M Medical Record Number: [id number] Patient Account Number: [id number] Date of Birth/Sex: Treating RN: [birth year] (57 y.o. F) Primary Care Provider: PCP, [name]: Referring Provider: Treating Provider/Extender: Sherryl Manges in Treatment: 3 Subjective Chief Complaint Information obtained from Patient Patient presents for treatment of open ulcers due to lymphedema History of Present Illness (HPI) ADMISSION 07/02/2023 ***ABIs non-compressible bilaterally; triphasic Doppler signals*** This is Joan 57 year old morbidly obese patient  who is not Joan diabetic. She does have hypertension and CHF. Although she  does not carry this diagnosis in her electronic medical record, she clearly has lymphedema. She has multiple wounds on her bilateral lower extremities, 1 of which has been present for 3 years. She has been treating her wounds with peroxide and bacitracin. Her provider has given her various courses of antibiotics, but the wounds have not closed. She was told not to wear compression stockings due to her history of DVT but this was well over Joan year ago. She has never worn lymphedema pumps. , 07/09/2023: Several of the smaller wounds have closed. Remaining wounds include the left proximal medial ulcer and the left lateral distal ulcer, as well as the paired wounds on her right lateral lower leg. There is slough accumulation at all sites. 07/16/2023: The left distal lateral leg ulcer is Joan little bit smaller and quite Joan bit cleaner. The more proximal medial left lower leg ulcer still has Joan fair amount of nonviable tissue present and this more tender. Her wrap did slide down and it appears that it came to rest right in the middle of this wound causing some pressure on the site. The paired right lateral lower leg ulcers are small and superficial with Joan little bit of slough and eschar present. 07/23/2023: The right lateral lower leg ulcers are nearly closed but remain just barely open underneath Joan layer of eschar. The proximal medial left lower leg ulcer has less nonviable tissue present today and is smaller. The left distal lateral leg ulcer is unchanged in size, but the surface is clean and the quality of the tissue is improving. Patient History Information obtained from Patient. Family History Unknown History. Social History Never smoker, Marital Status - Divorced, Alcohol Use - Rarely, Drug Use - No History, Caffeine Use - Daily - Tea,Coffee ,Soda. Medical History Hematologic/Lymphatic Patient has history of  Anemia Respiratory Patient has history of Asthma Cardiovascular Patient has history of Congestive Heart Failure, Deep Vein Thrombosis, Hypertension Musculoskeletal Patient has history of Osteoarthritis Hospitalization/Surgery History - Tubal Ligation. Medical Joan Surgical History Notes nd Cardiovascular Morbidly Obese Objective Constitutional Hypertensive, asymptomatic. Slightly tachycardic. no acute distress. Vitals Time Taken: 10:03 AM, Height: 71 in, Weight: 352 lbs, BMI: 49.1, Temperature: 98.2 F, Pulse: 109 bpm, Respiratory Rate: 16 breaths/min, Blood Pressure: 191/102 mmHg. General Notes: pt did not take her BP med ZANETTA, POL Joan ([id number]) [id number] Page 8 of 11 Respiratory Normal work of breathing on room air.. General Notes: 07/23/2023: The right lateral lower leg ulcers are nearly closed but remain just barely open underneath Joan layer of eschar. The proximal medial left lower leg ulcer has less nonviable tissue present today and is smaller. The left distal lateral leg ulcer is unchanged in size, but the surface is clean and the quality of the tissue is improving. Integumentary (Hair, Skin) Wound #2 status is Open. Original cause of wound was Gradually Appeared. The date acquired was: 07/01/2021. The wound has been in treatment 3 weeks. The wound is located on the Right,Lateral Lower Leg. The wound measures 0.2cm length x 0.2cm width x 0.1cm depth; 0.031cm^2 area and 0.003cm^3 volume. There is Fat Layer (Subcutaneous Tissue) exposed. There is no tunneling or undermining noted. There is Joan medium amount of serosanguineous drainage noted. The wound margin is distinct with the outline attached to the wound base. There is large (67-100%) red granulation within the wound bed. There is Joan small (1-33%) amount of necrotic tissue within the wound bed including Adherent Slough. The periwound skin appearance had no abnormalities noted for texture. The  periwound  skin appearance had no abnormalities noted for moisture. The periwound skin appearance exhibited: Hemosiderin Staining. Periwound temperature was noted as No Abnormality. Wound #3 status is Open. Original cause of wound was Gradually Appeared. The date acquired was: 04/01/2023. The wound has been in treatment 3 weeks. The wound is located on the Left,Proximal,Medial Lower Leg. The wound measures 1.1cm length x 0.9cm width x 0.2cm depth; 0.778cm^2 area and 0.156cm^3 volume. There is Fat Layer (Subcutaneous Tissue) exposed. There is no tunneling or undermining noted. There is Joan medium amount of serosanguineous drainage noted. The wound margin is distinct with the outline attached to the wound base. There is medium (34-66%) red granulation within the wound bed. There is Joan medium (34-66%) amount of necrotic tissue within the wound bed including Adherent Slough. The periwound skin appearance had no abnormalities noted for texture. The periwound skin appearance had no abnormalities noted for moisture. The periwound skin appearance exhibited: Hemosiderin Staining. Periwound temperature was noted as No Abnormality. Wound #6 status is Open. Original cause of wound was Gradually Appeared. The date acquired was: 08/09/2020. The wound has been in treatment 3 weeks. The wound is located on the Left,Lateral Lower Leg. The wound measures 1.6cm length x 1.5cm width x 0.2cm depth; 1.885cm^2 area and 0.377cm^3 volume. There is Fat Layer (Subcutaneous Tissue) exposed. There is no tunneling or undermining noted. There is Joan medium amount of serosanguineous drainage noted. The wound margin is distinct with the outline attached to the wound base. There is large (67-100%) red, pink granulation within the wound bed. There is Joan small (1- 33%) amount of necrotic tissue within the wound bed including Adherent Slough. The periwound skin appearance had no abnormalities noted for texture. The periwound skin appearance had no  abnormalities noted for moisture. The periwound skin appearance exhibited: Hemosiderin Staining. Periwound temperature was noted as No Abnormality. Assessment Active Problems ICD-10 Non-pressure chronic ulcer of other part of right lower leg with fat layer exposed 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osed Lymphedema, not elsewhere classified Chronic diastolic (congestive) heart failure Morbid (severe) obesity due to excess calories Procedures Wound #2 Pre-procedure diagnosis of Wound #2 is Joan Lymphedema located on the Right,Lateral Lower Leg . There was Joan Selective/Open Wound Non-Viable Tissue Debridement with Joan total area of 0.03 sq cm performed by Duanne Guess, MD. With the following instrument(s): Curette to remove Non-Viable tissue/material. Material removed includes Eschar after achieving pain control using Lidocaine 4% Topical Solution. No specimens were taken. Joan time out was conducted at 10:16, prior to the start of the procedure. Joan Minimum amount of bleeding was controlled with Pressure. The procedure was tolerated well. Post Debridement Measurements: 0.2cm length x 0.2cm width x 0.1cm depth; 0.003cm^3 volume. Character of Wound/Ulcer Post Debridement is improved. Post procedure Diagnosis Wound #2: Same as Pre-Procedure Pre-procedure diagnosis of Wound #2 is Joan Lymphedema located on the Right,Lateral Lower Leg . There was Joan Radio broadcast assistant Compression Therapy Procedure by Samuella Bruin, RN. Post procedure Diagnosis Wound #2: Same as Pre-Procedure Wound #3 Pre-procedure diagnosis of Wound #3 is Joan Lymphedema located on the Left,Proximal,Medial Lower Leg . There was Joan Excisional Skin/Subcutaneous Tissue Debridement with Joan total area of 0.78 sq cm performed by Duanne Guess, MD. With the following instrument(s): Curette to remove Non-Viable tissue/material. Material removed includes Subcutaneous Tissue and Slough and after achieving pain  control using Lidocaine 4% T opical Solution. No specimens were taken. Joan time out was conducted at 10:16, prior  to the start of the procedure. Joan Minimum amount of bleeding was controlled with Pressure. The procedure was tolerated well. Post Debridement Measurements: 1.1cm length x 0.9cm width x 0.2cm depth; 0.156cm^3 volume. Character of Wound/Ulcer Post Debridement is improved. Post procedure Diagnosis Wound #3: Same as Pre-Procedure Pre-procedure diagnosis of Wound #3 is Joan Lymphedema located on the Left,Proximal,Medial Lower Leg . There was Joan Radio broadcast assistant Compression Therapy Procedure by Samuella Bruin, RN. Post procedure Diagnosis Wound #3: Same as Pre-Procedure Wound #6 Pre-procedure diagnosis of Wound #6 is Joan Lymphedema located on the Left,Lateral Lower Leg . There was Joan Excisional Skin/Subcutaneous Tissue Debridement with Joan total area of 1.88 sq cm performed by Duanne Guess, MD. With the following instrument(s): Curette to remove Non-Viable tissue/material. Material removed includes Subcutaneous Tissue and Slough and after achieving pain control using Lidocaine 4% T opical Solution. No specimens were taken. Joan time out KAELIE, BOISVERT Joan ([id number]) [phone].pdf Page 9 of 11 was conducted at 10:16, prior to the start of the procedure. Joan Minimum amount of bleeding was controlled with Pressure. The procedure was tolerated well. Post Debridement Measurements: 1.6cm length x 1.5cm width x 0.2cm depth; 0.377cm^3 volume. Character of Wound/Ulcer Post Debridement is improved. Post procedure Diagnosis Wound #6: Same as Pre-Procedure Plan Follow-up Appointments: Return Appointment in 1 week. - Dr. Lady Gary Room 3 Anesthetic: (In clinic) Topical Lidocaine 4% applied to wound bed Bathing/ Shower/ Hygiene: May shower with protection but do not get wound dressing(s) wet. Protect dressing(s) with water repellant cover (for example, large plastic bag) or Joan cast cover and may  then take shower. - Keep legs dry until next appointment. Use Cast Protectors if so desired. Other Bathing/Shower/Hygiene Orders/Instructions: - May want to sponge bath if legs cannot be kept dry until next appointment. Additional Orders / Instructions: Follow Nutritious Diet - Try and increase Protein intake. The goal is 70g-100g per day. Examples are Chicken,fish.meat,pork, [place] yogurt ,eggs etc. Lymphedema Treatment Plan - Exercise, Compression and Elevation: Exercise daily as tolerated. (Walking, ROM, Calf Pumps and T T oe aps) Compression Wraps as ordered Elevate legs 30 - 60 minutes at or above heart level at least 3 - 4 times daily as able/tolerated Avoid standing for long periods and elevate leg(s) parallel to the floor when sitting The following medication(s) was prescribed: lidocaine topical 4 % cream cream topical was prescribed at facility WOUND #2: - Lower Leg Wound Laterality: Right, Lateral Cleanser: Soap and Water 1 x Per Week/30 Days Discharge Instructions: At Wound center-May shower and wash wound with dial antibacterial soap and water prior to dressing change. Peri-Wound Care: Triamcinolone 15 (g) 1 x Per Week/30 Days Discharge Instructions: Use triamcinolone 15 (g) as directed Peri-Wound Care: Sween Lotion (Moisturizing lotion) 1 x Per Week/30 Days Discharge Instructions: Apply moisturizing lotion as directed Prim Dressing: Hydrofera Blue Ready Transfer Foam, 2.5x2.5 (in/in) 1 x Per Week/30 Days ary Discharge Instructions: Apply directly to wound bed as directed Secondary Dressing: Woven Gauze Sponge, Non-Sterile 4x4 in 1 x Per Week/30 Days Discharge Instructions: Apply over primary dressing as directed. Com pression Wrap: Unnaboot w/Calamine, 4x10 (in/yd) 1 x Per Week/30 Days Discharge Instructions: Apply Unnaboot as directed. Com pression Stockings: Jobst Farrow Wrap 4000 Compression Amount: 30-40 mmHg (right) Discharge Instructions: Apply Renee Pain daily as  instructed. Apply first thing in the morning, remove at night before bed. WOUND #3: - Lower Leg Wound Laterality: Left, Medial, Proximal Cleanser: Soap and Water 1 x Per Week/30 Days Discharge Instructions: At Wound center-May shower and  wash wound with dial antibacterial soap and water prior to dressing change. Peri-Wound Care: Triamcinolone 15 (g) 1 x Per Week/30 Days Discharge Instructions: Use triamcinolone 15 (g) as directed Peri-Wound Care: Sween Lotion (Moisturizing lotion) 1 x Per Week/30 Days Discharge Instructions: Apply moisturizing lotion as directed Prim Dressing: Hydrofera Blue Ready Transfer Foam, 4x5 (in/in) 1 x Per Week/30 Days ary Discharge Instructions: Apply to wound bed as instructed Secondary Dressing: Woven Gauze Sponge, Non-Sterile 4x4 in 1 x Per Week/30 Days Discharge Instructions: Apply over primary dressing as directed. Com pression Wrap: Unnaboot w/Calamine, 4x10 (in/yd) 1 x Per Week/30 Days Discharge Instructions: Apply Unnaboot as directed. WOUND #6: - Lower Leg Wound Laterality: Left, Lateral Cleanser: Soap and Water 1 x Per Week/30 Days Discharge Instructions: At Wound center-May shower and wash wound with dial antibacterial soap and water prior to dressing change. Peri-Wound Care: Triamcinolone 15 (g) 1 x Per Week/30 Days Discharge Instructions: Use triamcinolone 15 (g) as directed Peri-Wound Care: Sween Lotion (Moisturizing lotion) 1 x Per Week/30 Days Discharge Instructions: Apply moisturizing lotion as directed Prim Dressing: Hydrofera Blue Ready Transfer Foam, 4x5 (in/in) 1 x Per Week/30 Days ary Discharge Instructions: Apply to wound bed as instructed Secondary Dressing: Woven Gauze Sponge, Non-Sterile 4x4 in 1 x Per Week/30 Days Discharge Instructions: Apply over primary dressing as directed. Com pression Wrap: Unnaboot w/Calamine, 4x10 (in/yd) 1 x Per Week/30 Days Discharge Instructions: Apply Unnaboot as directed. Com pression Stockings: Jobst  Farrow Wrap 4000 Compression Amount: 30-40 mmHg (left) Discharge Instructions: Apply Renee Pain daily as instructed. Apply first thing in the morning, remove at night before bed. 07/23/2023: The right lateral lower leg ulcers are nearly closed but remain just barely open underneath Joan layer of eschar. The proximal medial left lower leg ulcer has less nonviable tissue present today and is smaller. The left distal lateral leg ulcer is unchanged in size, but the surface is clean and the quality of the tissue is improving. I used Joan curette to debride the eschar off of the right lateral lower leg ulcers. I debrided slough and subcutaneous tissue from both of the left leg ulcers. Will continue Hydrofera Blue with bilateral calamine-based Unna boots. Follow-up in 1 week. Electronic Signature(s) Signed: 07/23/2023 10:30:49 AM By: Duanne Guess MD FACS Claude Manges Joan ([id number]) [id number] Page 10 of 11 Signed: 07/23/2023 10:30:49 AM By: Duanne Guess MD FACS Entered By: Duanne Guess on 07/23/2023 10:30:49 -------------------------------------------------------------------------------- HxROS Details Patient Name: Date of Service: Joan Officer, [name] 07/23/2023 10:00 Joan M Medical Record Number: [id number] Patient Account Number: [id number] Date of Birth/Sex: Treating RN: 03-Mar-1966 (57 y.o. F) Primary Care Provider: PCP, [name]: Referring Provider: Treating Provider/Extender: Sherryl Manges in Treatment: 3 Information Obtained From Patient Hematologic/Lymphatic Medical History: Positive for: Anemia Respiratory Medical History: Positive for: Asthma Cardiovascular Medical History: Positive for: Congestive Heart Failure; Deep Vein Thrombosis; Hypertension Past Medical History Notes: Morbidly Obese Musculoskeletal Medical History: Positive for: Osteoarthritis Immunizations Pneumococcal Vaccine: Received Pneumococcal Vaccination:  No Implantable Devices None Hospitalization / Surgery History Type of Hospitalization/Surgery Tubal Ligation Family and Social History Unknown History: Yes; Never smoker; Marital Status - Divorced; Alcohol Use: Rarely; Drug Use: No History; Caffeine Use: Daily - Tea,Coffee ,Soda; Financial Concerns: No; Food, Clothing or Shelter Needs: No; Support System Lacking: No; Transportation Concerns: No Psychologist, prison and probation services) Signed: 07/23/2023 10:36:36 AM By: Duanne Guess MD FACS Entered By: Duanne Guess on 07/23/2023 10:23:43 -------------------------------------------------------------------------------- SuperBill Details Patient Name: Date of Service: Joan LSTO Dorris Carnes, [name]  07/23/2023 Claude Manges Joan ([id number]) [id number] Page 11 of 11 Medical Record Number: [id number] Patient Account Number: [id number] Date of Birth/Sex: Treating RN: 1966-05-20 (57 y.o. F) Primary Care Provider: PCP, [name]: Referring Provider: Treating Provider/Extender: Sherryl Manges in Treatment: 3 Diagnosis Coding ICD-10 Codes Code Description [id number] Non-pressure chronic ulcer of other part of right lower leg with fat layer exposed L97.822 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osed I89.0 Lymphedema, not elsewhere classified I50.32 Chronic diastolic (congestive) heart failure E66.01 Morbid (severe) obesity due to excess calories Facility Procedures : CPT4 Code: 32440102 Description: 11042 - DEB SUBQ TISSUE 20 SQ CM/< ICD-10 Diagnosis Description L97.822 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ose Modifier: d Quantity: 1 : CPT4 Code: [id number] Description: 97597 - DEBRIDE WOUND 1ST 20 SQ CM OR < ICD-10 Diagnosis Description L97.812 Non-pressure chronic ulcer of other part of right lower leg with fat layer expos Modifier: ed Quantity: 1 Physician Procedures : CPT4 Code Description  Modifier [id number] 99213 - WC PHYS LEVEL 3 - EST PT ICD-10 Diagnosis Description L97.812 Non-pressure chronic ulcer of other part of right lower leg with fat layer exposed L97.822 Non-pressure chronic ulcer of other part of left  lower leg with fat layer exposed I89.0 Lymphedema, not elsewhere classified I50.32 Chronic diastolic (congestive) heart failure Quantity: 1 : [id number] 11042 - WC PHYS SUBQ TISS 20 SQ CM ICD-10 Diagnosis Description L97.822 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osed Quantity: 1 : [id number] 97597 - WC PHYS DEBR WO ANESTH 20 SQ CM ICD-10 Diagnosis Description L97.812 Non-pressure chronic ulcer of other part of right lower leg with fat layer exposed Quantity: 1 Electronic Signature(s) Signed: 07/23/2023 10:31:09 AM By: Duanne Guess MD FACS Entered By: Duanne Guess on 07/23/2023 10:31:09

## 2023-07-26 DIAGNOSIS — Z79899 Other long term (current) drug therapy: Secondary | ICD-10-CM | POA: Diagnosis not present

## 2023-07-26 DIAGNOSIS — I10 Essential (primary) hypertension: Secondary | ICD-10-CM | POA: Diagnosis not present

## 2023-07-26 DIAGNOSIS — M5137 Other intervertebral disc degeneration, lumbosacral region with discogenic back pain only: Secondary | ICD-10-CM | POA: Diagnosis not present

## 2023-07-26 DIAGNOSIS — F419 Anxiety disorder, unspecified: Secondary | ICD-10-CM | POA: Diagnosis not present

## 2023-07-26 DIAGNOSIS — E119 Type 2 diabetes mellitus without complications: Secondary | ICD-10-CM | POA: Diagnosis not present

## 2023-07-26 DIAGNOSIS — E559 Vitamin D deficiency, unspecified: Secondary | ICD-10-CM | POA: Diagnosis not present

## 2023-07-26 DIAGNOSIS — E538 Deficiency of other specified B group vitamins: Secondary | ICD-10-CM | POA: Diagnosis not present

## 2023-07-26 DIAGNOSIS — K219 Gastro-esophageal reflux disease without esophagitis: Secondary | ICD-10-CM | POA: Diagnosis not present

## 2023-07-26 DIAGNOSIS — Z6841 Body Mass Index (BMI) 40.0 and over, adult: Secondary | ICD-10-CM | POA: Diagnosis not present

## 2023-07-26 DIAGNOSIS — F331 Major depressive disorder, recurrent, moderate: Secondary | ICD-10-CM | POA: Diagnosis not present

## 2023-07-30 ENCOUNTER — Encounter (HOSPITAL_BASED_OUTPATIENT_CLINIC_OR_DEPARTMENT_OTHER): Payer: Medicare HMO | Attending: General Surgery | Admitting: General Surgery

## 2023-07-30 DIAGNOSIS — Z79899 Other long term (current) drug therapy: Secondary | ICD-10-CM | POA: Diagnosis not present

## 2023-07-30 DIAGNOSIS — I5032 Chronic diastolic (congestive) heart failure: Secondary | ICD-10-CM | POA: Diagnosis not present

## 2023-07-30 DIAGNOSIS — L97812 Non-pressure chronic ulcer of other part of right lower leg with fat layer exposed: Secondary | ICD-10-CM | POA: Diagnosis not present

## 2023-07-30 DIAGNOSIS — Z86718 Personal history of other venous thrombosis and embolism: Secondary | ICD-10-CM | POA: Insufficient documentation

## 2023-07-30 DIAGNOSIS — I110 Hypertensive heart disease with heart failure: Secondary | ICD-10-CM | POA: Insufficient documentation

## 2023-07-30 DIAGNOSIS — L97822 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osed: Secondary | ICD-10-CM | POA: Diagnosis present

## 2023-07-30 DIAGNOSIS — Z6841 Body Mass Index (BMI) 40.0 and over, adult: Secondary | ICD-10-CM | POA: Insufficient documentation

## 2023-07-30 DIAGNOSIS — I890 Lymphedema, not elsewhere classified: Secondary | ICD-10-CM | POA: Insufficient documentation

## 2023-07-30 DIAGNOSIS — I87319 Chronic venous hypertension (idiopathic) with ulcer of unspecified lower extremity: Secondary | ICD-10-CM | POA: Insufficient documentation

## 2023-07-30 DIAGNOSIS — J45909 Unspecified asthma, uncomplicated: Secondary | ICD-10-CM | POA: Insufficient documentation

## 2023-07-30 NOTE — Progress Notes (Addendum)
NASIYAH, LAVERDIERE A ([id number]) [id number] Page 1 of 10 Visit Report for 07/30/2023 Chief Complaint Document Details Patient Name: Date of Service: [name], [name] 07/30/2023 7:45 A M Medical Record Number: [id number] Patient Account Number: [id number] Date of Birth/Sex: Treating RN: Feb 23, 1966 (57 y.o. F) Primary Care Provider: PCP, [name]: Referring Provider: Treating Provider/Extender: Sherryl Manges in Treatment: 4 Information Obtained from: Patient Chief Complaint Patient presents for treatment of open ulcers due to lymphedema Electronic Signature(s) Signed: 07/30/2023 8:17:20 AM By: Duanne Guess MD FACS Entered By: Duanne Guess on 07/30/2023 05:17:20 -------------------------------------------------------------------------------- Debridement Details Patient Name: Date of Service: Joan Officer, [name] 07/30/2023 7:45 A M Medical Record Number: [id number] Patient Account Number: [id number] Date of Birth/Sex: Treating RN: 03-29-1966 (57 y.o. Fredderick Phenix Primary Care Provider: PCP, [name]: Referring Provider: Treating Provider/Extender: Sherryl Manges in Treatment: 4 Debridement Performed for Assessment: Wound #3 Left,Proximal,Medial Lower Leg Performed By: Physician Duanne Guess, MD The following information was scribed by: Samuella Bruin The information was scribed for: Duanne Guess Debridement Type: Debridement Level of Consciousness (Pre-procedure): Awake and Alert Pre-procedure Verification/Time Out Yes - 08:12 Taken: Start Time: 08:12 Pain Control: Lidocaine 4% T opical Solution Percent of Wound Bed Debrided: 100% T Area Debrided (cm): otal 0.47 Tissue and other material debrided: Non-Viable, Slough, Subcutaneous, Slough Level: Skin/Subcutaneous Tissue Debridement Description: Excisional Instrument: Curette Bleeding: Minimum Hemostasis Achieved: Pressure Response to  Treatment: Procedure was tolerated well Level of Consciousness (Post- Awake and Alert procedure): Post Debridement Measurements of Total Wound Length: (cm) 0.6 Width: (cm) 1 Depth: (cm) 0.1 Volume: (cm) 0.047 Character of Wound/Ulcer Post Debridement: Improved Post Procedure Diagnosis ODELIA, GRACIANO A ([id number]) [id number] Page 2 of 10 Same as Pre-procedure Electronic Signature(s) Signed: 07/30/2023 9:23:45 AM By: Duanne Guess MD FACS Signed: 07/30/2023 4:14:29 PM By: Samuella Bruin Entered By: Samuella Bruin on 07/30/2023 05:13:19 -------------------------------------------------------------------------------- Debridement Details Patient Name: Date of Service: Joan Officer, [name] 07/30/2023 7:45 A M Medical Record Number: [id number] Patient Account Number: [id number] Date of Birth/Sex: Treating RN: 1966-07-02 (57 y.o. Fredderick Phenix Primary Care Provider: PCP, [name]: Referring Provider: Treating Provider/Extender: Sherryl Manges in Treatment: 4 Debridement Performed for Assessment: Wound #6 Left,Lateral Lower Leg Performed By: Physician Duanne Guess, MD The following information was scribed by: Samuella Bruin The information was scribed for: Duanne Guess Debridement Type: Debridement Level of Consciousness (Pre-procedure): Awake and Alert Pre-procedure Verification/Time Out Yes - 08:12 Taken: Start Time: 08:12 Pain Control: Lidocaine 4% T opical Solution Percent of Wound Bed Debrided: 100% T Area Debrided (cm): otal 2.12 Tissue and other material debrided: Non-Viable, Slough, Subcutaneous, Slough Level: Skin/Subcutaneous Tissue Debridement Description: Excisional Instrument: Curette Bleeding: Minimum Hemostasis Achieved: Pressure Response to Treatment: Procedure was tolerated well Level of Consciousness (Post- Awake and Alert procedure): Post Debridement Measurements of Total Wound Length:  (cm) 1.8 Width: (cm) 1.5 Depth: (cm) 0.2 Volume: (cm) 0.424 Character of Wound/Ulcer Post Debridement: Improved Post Procedure Diagnosis Same as Pre-procedure Electronic Signature(s) Signed: 07/30/2023 9:23:45 AM By: Duanne Guess MD FACS Signed: 07/30/2023 4:14:29 PM By: Samuella Bruin Entered By: Samuella Bruin on 07/30/2023 05:14:13 -------------------------------------------------------------------------------- HPI Details Patient Name: Date of Service: Joan Officer, [name] 07/30/2023 7:45 A M Medical Record Number: [id number] Patient Account Number: [id number] Date of Birth/Sex: Treating RN: [birth year] (57 y.o. F) Primary Care Provider: PCP, [name]: SHONNA, DEITER ([id number]) [id number] Page 3 of 10 Referring Provider: Treating Provider/Extender: Sherryl Manges in  Treatment: 4 History of Present Illness HPI Description: ADMISSION 07/02/2023 ***ABIs non-compressible bilaterally; triphasic Doppler signals*** This is a 57 year old morbidly obese patient who is not a diabetic. She does have hypertension and CHF. Although she does not carry this diagnosis in her electronic medical record, she clearly has lymphedema. She has multiple wounds on her bilateral lower extremities, 1 of which has been present for 3 years. She has been treating her wounds with peroxide and bacitracin. Her provider has given her various courses of antibiotics, but the wounds have not closed. She was told not to wear compression stockings due to her history of DVT but this was well over a year ago. She has never worn lymphedema pumps. , 07/09/2023: Several of the smaller wounds have closed. Remaining wounds include the left proximal medial ulcer and the left lateral distal ulcer, as well as the paired wounds on her right lateral lower leg. There is slough accumulation at all sites. 07/16/2023: The left distal lateral leg ulcer is a little bit smaller and  quite a bit cleaner. The more proximal medial left lower leg ulcer still has a fair amount of nonviable tissue present and this more tender. Her wrap did slide down and it appears that it came to rest right in the middle of this wound causing some pressure on the site. The paired right lateral lower leg ulcers are small and superficial with a little bit of slough and eschar present. 07/23/2023: The right lateral lower leg ulcers are nearly closed but remain just barely open underneath a layer of eschar. The proximal medial left lower leg ulcer has less nonviable tissue present today and is smaller. The left distal lateral leg ulcer is unchanged in size, but the surface is clean and the quality of the tissue is improving. 07/30/2023: Both right leg ulcers are healed. The proximal medial left leg ulcer is smaller and more superficial. The left distal lateral leg ulcer is still unchanged in size. There is more granulation tissue filling in, but the surface is still fairly fibrotic. Edema control is adequate. Electronic Signature(s) Signed: 07/30/2023 8:18:22 AM By: Duanne Guess MD FACS Entered By: Duanne Guess on 07/30/2023 05:18:22 -------------------------------------------------------------------------------- Physical Exam Details Patient Name: Date of Service: Joan Officer, [name] 07/30/2023 7:45 A M Medical Record Number: [id number] Patient Account Number: [id number] Date of Birth/Sex: Treating RN: 06-28-1966 (57 y.o. F) Primary Care Provider: PCP, [name]: Referring Provider: Treating Provider/Extender: Sherryl Manges in Treatment: 4 Constitutional Hypertensive, asymptomatic. Tachycardic, asymptomatic. . . no acute distress. Respiratory Normal work of breathing on room air.. Notes 07/30/2023: Both right leg ulcers are healed. The proximal medial left leg ulcer is smaller and more superficial. The left distal lateral leg ulcer is still unchanged in size. There is more  granulation tissue filling in, but the surface is still fairly fibrotic. Edema control is adequate. Electronic Signature(s) Signed: 07/30/2023 8:21:43 AM By: Duanne Guess MD FACS Previous Signature: 07/30/2023 8:18:38 AM Version By: Duanne Guess MD FACS Entered By: Duanne Guess on 07/30/2023 05:21:43 -------------------------------------------------------------------------------- Physician Orders Details Patient Name: Date of Service: Joan Officer, [name] 07/30/2023 7:45 A M Medical Record Number: [id number] Patient Account Number: [id number] Date of Birth/Sex: Treating RN: 1966-03-16 (57 y.o. Angie, [place], [place] A ([id number]) [id number] Page 4 of 10 Primary Care Provider: PCP, [name]: Referring Provider: Treating Provider/Extender: Sherryl Manges in Treatment: 4 The following information was scribed by: Samuella Bruin The information was scribed for: Duanne Guess  Verbal / Phone Orders: No Diagnosis Coding ICD-10 Coding Code Description [phone] Non-pressure chronic ulcer of other part of right lower leg with fat layer exposed L97.822 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osed I87.319 Chronic venous hypertension (idiopathic) with ulcer of unspecified lower extremity I89.0 Lymphedema, not elsewhere classified I50.32 Chronic diastolic (congestive) heart failure E66.01 Morbid (severe) obesity due to excess calories Follow-up Appointments ppointment in 1 week. - Dr. Lady Gary Room 3 Return A Anesthetic (In clinic) Topical Lidocaine 4% applied to wound bed Bathing/ Shower/ Hygiene May shower with protection but do not get wound dressing(s) wet. Protect dressing(s) with water repellant cover (for example, large plastic bag) or a cast cover and may then take shower. - Keep legs dry until next appointment. Use Cast Protectors if so desired. Other Bathing/Shower/Hygiene  Orders/Instructions: - May want to sponge bath if legs cannot be kept dry until next appointment. Additional Orders / Instructions Follow Nutritious Diet - Try and increase Protein intake. The goal is 70g-100g per day. Examples are Chicken,fish.meat,pork, [place] yogurt ,eggs etc. Lymphedema Treatment Plan - Exercise, Compression and Elevation Exercise daily as tolerated. (Walking, ROM, Calf Pumps and Toe Taps) Compression Wraps as ordered Elevate legs 30 - 60 minutes at or above heart level at least 3 - 4 times daily as able/tolerated Avoid standing for long periods and elevate leg(s) parallel to the floor when sitting Wound Treatment Wound #3 - Lower Leg Wound Laterality: Left, Medial, Proximal Cleanser: Soap and Water 1 x Per Week/30 Days Discharge Instructions: At Wound center-May shower and wash wound with dial antibacterial soap and water prior to dressing change. Peri-Wound Care: Triamcinolone 15 (g) 1 x Per Week/30 Days Discharge Instructions: Use triamcinolone 15 (g) as directed Peri-Wound Care: Sween Lotion (Moisturizing lotion) 1 x Per Week/30 Days Discharge Instructions: Apply moisturizing lotion as directed Topical: Gentamicin 1 x Per Week/30 Days Discharge Instructions: As directed by physician Topical: Mupirocin Ointment 1 x Per Week/30 Days Discharge Instructions: Apply Mupirocin (Bactroban) as instructed Prim Dressing: Hydrofera Blue Ready Transfer Foam, 4x5 (in/in) 1 x Per Week/30 Days ary Discharge Instructions: Apply to wound bed as instructed Secondary Dressing: Woven Gauze Sponge, Non-Sterile 4x4 in 1 x Per Week/30 Days Discharge Instructions: Apply over primary dressing as directed. Compression Wrap: Unnaboot w/Calamine, 4x10 (in/yd) 1 x Per Week/30 Days Discharge Instructions: Apply Unnaboot as directed. Wound #6 - Lower Leg Wound Laterality: Left, Lateral Cleanser: Soap and Water 1 x Per Week/30 Days Discharge Instructions: At Wound center-May shower and wash  wound with dial antibacterial soap and water prior to dressing change. Peri-Wound Care: Triamcinolone 15 (g) 1 x Per Week/30 Days Discharge Instructions: Use triamcinolone 15 (g) as directed Peri-Wound Care: Sween Lotion (Moisturizing lotion) 1 x Per Week/30 Days Discharge Instructions: Apply moisturizing lotion as directed OCIA, SIMEK A ([id number]) [id number] Page 5 of 10 Topical: Gentamicin 1 x Per Week/30 Days Discharge Instructions: As directed by physician Topical: Mupirocin Ointment 1 x Per Week/30 Days Discharge Instructions: Apply Mupirocin (Bactroban) as instructed Prim Dressing: Hydrofera Blue Ready Transfer Foam, 4x5 (in/in) 1 x Per Week/30 Days ary Discharge Instructions: Apply to wound bed as instructed Secondary Dressing: Woven Gauze Sponge, Non-Sterile 4x4 in 1 x Per Week/30 Days Discharge Instructions: Apply over primary dressing as directed. Compression Wrap: Unnaboot w/Calamine, 4x10 (in/yd) 1 x Per Week/30 Days Discharge Instructions: Apply Unnaboot as directed. Compression Stockings: Jobst Farrow Wrap 4000 Left Leg Compression Amount: 30-40 mmHG Discharge Instructions: Apply Renee Pain daily as instructed.  Apply first thing in the morning, remove at night before bed. Patient Medications llergies: No Known Allergies A Notifications Medication Indication Start End 07/30/2023 lidocaine DOSE topical 4 % cream - cream topical Electronic Signature(s) Signed: 07/30/2023 9:23:45 AM By: Duanne Guess MD FACS Entered By: Duanne Guess on 07/30/2023 05:21:52 -------------------------------------------------------------------------------- Problem List Details Patient Name: Date of Service: Joan Officer, [name] 07/30/2023 7:45 A M Medical Record Number: [id number] Patient Account Number: [id number] Date of Birth/Sex: Treating RN: [birth year] (57 y.o. F) Primary Care Provider: PCP, [name]: Referring Provider: Treating  Provider/Extender: Sherryl Manges in Treatment: 4 Active Problems ICD-10 Encounter Code Description Active Date MDM Diagnosis L97.812 Non-pressure chronic ulcer of other part of right lower leg with fat layer 07/02/2023 No Yes exposed L97.822 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osed10/03/2023 No Yes I87.319 Chronic venous hypertension (idiopathic) with ulcer of unspecified lower 07/02/2023 No Yes extremity I89.0 Lymphedema, not elsewhere classified 07/02/2023 No Yes I50.32 Chronic diastolic (congestive) heart failure 07/02/2023 No Yes HARPREET, SIGNORE A ([id number]) [phone].pdf Page 6 of 10 E66.01 Morbid (severe) obesity due to excess calories 07/02/2023 No Yes Inactive Problems Resolved Problems Electronic Signature(s) Signed: 07/30/2023 8:17:04 AM By: Duanne Guess MD FACS Previous Signature: 07/30/2023 8:10:26 AM Version By: Duanne Guess MD FACS Entered By: Duanne Guess on 07/30/2023 05:17:04 -------------------------------------------------------------------------------- Progress Note Details Patient Name: Date of Service: Joan Officer, [name] 07/30/2023 7:45 A M Medical Record Number: [id number] Patient Account Number: [id number] Date of Birth/Sex: Treating RN: [birth year] (57 y.o. F) Primary Care Provider: PCP, [name]: Referring Provider: Treating Provider/Extender: Sherryl Manges in Treatment: 4 Subjective Chief Complaint Information obtained from Patient Patient presents for treatment of open ulcers due to lymphedema History of Present Illness (HPI) ADMISSION 07/02/2023 ***ABIs non-compressible bilaterally; triphasic Doppler signals*** This is a 57 year old morbidly obese patient who is not a diabetic. She does have hypertension and CHF. Although she does not carry this diagnosis in her electronic medical record, she clearly has lymphedema. She has multiple wounds on her bilateral  lower extremities, 1 of which has been present for 3 years. She has been treating her wounds with peroxide and bacitracin. Her provider has given her various courses of antibiotics, but the wounds have not closed. She was told not to wear compression stockings due to her history of DVT but this was well over a year ago. She has never worn lymphedema pumps. , 07/09/2023: Several of the smaller wounds have closed. Remaining wounds include the left proximal medial ulcer and the left lateral distal ulcer, as well as the paired wounds on her right lateral lower leg. There is slough accumulation at all sites. 07/16/2023: The left distal lateral leg ulcer is a little bit smaller and quite a bit cleaner. The more proximal medial left lower leg ulcer still has a fair amount of nonviable tissue present and this more tender. Her wrap did slide down and it appears that it came to rest right in the middle of this wound causing some pressure on the site. The paired right lateral lower leg ulcers are small and superficial with a little bit of slough and eschar present. 07/23/2023: The right lateral lower leg ulcers are nearly closed but remain just barely open underneath a layer of eschar. The proximal medial left lower leg ulcer has less nonviable tissue present today and is smaller. The left distal lateral leg ulcer is unchanged in size, but the surface is clean  and the quality of the tissue is improving. 07/30/2023: Both right leg ulcers are healed. The proximal medial left leg ulcer is smaller and more superficial. The left distal lateral leg ulcer is still unchanged in size. There is more granulation tissue filling in, but the surface is still fairly fibrotic. Edema control is adequate. Patient History Information obtained from Patient. Family History Unknown History. Social History Never smoker, Marital Status - Divorced, Alcohol Use - Rarely, Drug Use - No History, Caffeine Use - Daily - Tea,Coffee  ,Soda. Medical History Hematologic/Lymphatic Patient has history of Anemia Respiratory Patient has history of Asthma Cardiovascular Patient has history of Congestive Heart Failure, Deep Vein Thrombosis, Hypertension Musculoskeletal Patient has history of Osteoarthritis TONEKA, FULLEN A ([id number]) [id number] Page 7 of 10 Hospitalization/Surgery History - Tubal Ligation. Medical A Surgical History Notes nd Cardiovascular Morbidly Obese Objective Constitutional Hypertensive, asymptomatic. Tachycardic, asymptomatic. no acute distress. Vitals Time Taken: 7:52 AM, Height: 71 in, Weight: 352 lbs, BMI: 49.1, Temperature: 98 F, Pulse: 111 bpm, Respiratory Rate: 18 breaths/min, Blood Pressure: 196/98 mmHg. Respiratory Normal work of breathing on room air.. General Notes: 07/30/2023: Both right leg ulcers are healed. The proximal medial left leg ulcer is smaller and more superficial. The left distal lateral leg ulcer is still unchanged in size. There is more granulation tissue filling in, but the surface is still fairly fibrotic. Edema control is adequate. Integumentary (Hair, Skin) Wound #2 status is Healed - Epithelialized. Original cause of wound was Gradually Appeared. The date acquired was: 07/01/2021. The wound has been in treatment 4 weeks. The wound is located on the Right,Lateral Lower Leg. The wound measures 0cm length x 0cm width x 0cm depth; 0cm^2 area and 0cm^3 volume. There is no tunneling or undermining noted. There is a none present amount of drainage noted. The wound margin is distinct with the outline attached to the wound base. There is no granulation within the wound bed. There is no necrotic tissue within the wound bed. The periwound skin appearance had no abnormalities noted for texture. The periwound skin appearance had no abnormalities noted for moisture. The periwound skin appearance exhibited: Hemosiderin Staining. Periwound temperature was  noted as No Abnormality. Wound #3 status is Open. Original cause of wound was Gradually Appeared. The date acquired was: 04/01/2023. The wound has been in treatment 4 weeks. The wound is located on the Left,Proximal,Medial Lower Leg. The wound measures 0.6cm length x 1cm width x 0.1cm depth; 0.471cm^2 area and 0.047cm^3 volume. There is Fat Layer (Subcutaneous Tissue) exposed. There is no tunneling or undermining noted. There is a medium amount of serosanguineous drainage noted. The wound margin is distinct with the outline attached to the wound base. There is large (67-100%) red granulation within the wound bed. There is a small (1-33%) amount of necrotic tissue within the wound bed including Adherent Slough. The periwound skin appearance had no abnormalities noted for texture. The periwound skin appearance had no abnormalities noted for moisture. The periwound skin appearance exhibited: Hemosiderin Staining. Periwound temperature was noted as No Abnormality. Wound #6 status is Open. Original cause of wound was Gradually Appeared. The date acquired was: 08/09/2020. The wound has been in treatment 4 weeks. The wound is located on the Left,Lateral Lower Leg. The wound measures 1.8cm length x 1.5cm width x 0.2cm depth; 2.121cm^2 area and 0.424cm^3 volume. There is Fat Layer (Subcutaneous Tissue) exposed. There is no tunneling or undermining noted. There is a medium amount of serosanguineous drainage noted. The wound margin is distinct with the  outline attached to the wound base. There is medium (34-66%) red, pink granulation within the wound bed. There is a medium (34-66%) amount of necrotic tissue within the wound bed including Adherent Slough. The periwound skin appearance had no abnormalities noted for texture. The periwound skin appearance had no abnormalities noted for moisture. The periwound skin appearance exhibited: Hemosiderin Staining. Periwound temperature was noted as No  Abnormality. Assessment Active Problems ICD-10 Non-pressure chronic ulcer of other part of right lower leg with fat layer exposed 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osed Chronic venous hypertension (idiopathic) with ulcer of unspecified lower extremity Lymphedema, not elsewhere classified Chronic diastolic (congestive) heart failure Morbid (severe) obesity due to excess calories Procedures Wound #3 Pre-procedure diagnosis of Wound #3 is a Lymphedema located on the Left,Proximal,Medial Lower Leg . There was a Excisional Skin/Subcutaneous Tissue Debridement with a total area of 0.47 sq cm performed by Duanne Guess, MD. With the following instrument(s): Curette to remove Non-Viable tissue/material. Material removed includes Subcutaneous Tissue and Slough and after achieving pain control using Lidocaine 4% T opical Solution. No specimens were taken. A time out was conducted at 08:12, prior to the start of the procedure. A Minimum amount of bleeding was controlled with Pressure. The procedure was tolerated well. Post Debridement Measurements: 0.6cm length x 1cm width x 0.1cm depth; 0.047cm^3 volume. Character of Wound/Ulcer Post Debridement is improved. Post procedure Diagnosis Wound #3: Same as Pre-Procedure ANECIA, NUSBAUM A ([id number]) [id number] Page 8 of 10 Pre-procedure diagnosis of Wound #3 is a Lymphedema located on the Left,Proximal,Medial Lower Leg . There was a Three Layer Compression Therapy Procedure by Samuella Bruin, RN. Post procedure Diagnosis Wound #3: Same as Pre-Procedure Wound #6 Pre-procedure diagnosis of Wound #6 is a Lymphedema located on the Left,Lateral Lower Leg . There was a Excisional Skin/Subcutaneous Tissue Debridement with a total area of 2.12 sq cm performed by Duanne Guess, MD. With the following instrument(s): Curette to remove Non-Viable tissue/material. Material removed includes  Subcutaneous Tissue and Slough and after achieving pain control using Lidocaine 4% T opical Solution. No specimens were taken. A time out was conducted at 08:12, prior to the start of the procedure. A Minimum amount of bleeding was controlled with Pressure. The procedure was tolerated well. Post Debridement Measurements: 1.8cm length x 1.5cm width x 0.2cm depth; 0.424cm^3 volume. Character of Wound/Ulcer Post Debridement is improved. Post procedure Diagnosis Wound #6: Same as Pre-Procedure Plan Follow-up Appointments: Return Appointment in 1 week. - Dr. Lady Gary Room 3 Anesthetic: (In clinic) Topical Lidocaine 4% applied to wound bed Bathing/ Shower/ Hygiene: May shower with protection but do not get wound dressing(s) wet. Protect dressing(s) with water repellant cover (for example, large plastic bag) or a cast cover and may then take shower. - Keep legs dry until next appointment. Use Cast Protectors if so desired. Other Bathing/Shower/Hygiene Orders/Instructions: - May want to sponge bath if legs cannot be kept dry until next appointment. Additional Orders / Instructions: Follow Nutritious Diet - Try and increase Protein intake. The goal is 70g-100g per day. Examples are Chicken,fish.meat,pork, [place] yogurt ,eggs etc. Lymphedema Treatment Plan - Exercise, Compression and Elevation: Exercise daily as tolerated. (Walking, ROM, Calf Pumps and T T oe aps) Compression Wraps as ordered Elevate legs 30 - 60 minutes at or above heart level at least 3 - 4 times daily as able/tolerated Avoid standing for long periods and elevate leg(s) parallel to the floor when sitting The following medication(s) was prescribed: lidocaine topical  4 % cream cream topical was prescribed at facility WOUND #3: - Lower Leg Wound Laterality: Left, Medial, Proximal Cleanser: Soap and Water 1 x Per Week/30 Days Discharge Instructions: At Wound center-May shower and wash wound with dial antibacterial soap and water prior to  dressing change. Peri-Wound Care: Triamcinolone 15 (g) 1 x Per Week/30 Days Discharge Instructions: Use triamcinolone 15 (g) as directed Peri-Wound Care: Sween Lotion (Moisturizing lotion) 1 x Per Week/30 Days Discharge Instructions: Apply moisturizing lotion as directed Topical: Gentamicin 1 x Per Week/30 Days Discharge Instructions: As directed by physician Topical: Mupirocin Ointment 1 x Per Week/30 Days Discharge Instructions: Apply Mupirocin (Bactroban) as instructed Prim Dressing: Hydrofera Blue Ready Transfer Foam, 4x5 (in/in) 1 x Per Week/30 Days ary Discharge Instructions: Apply to wound bed as instructed Secondary Dressing: Woven Gauze Sponge, Non-Sterile 4x4 in 1 x Per Week/30 Days Discharge Instructions: Apply over primary dressing as directed. Com pression Wrap: Unnaboot w/Calamine, 4x10 (in/yd) 1 x Per Week/30 Days Discharge Instructions: Apply Unnaboot as directed. WOUND #6: - Lower Leg Wound Laterality: Left, Lateral Cleanser: Soap and Water 1 x Per Week/30 Days Discharge Instructions: At Wound center-May shower and wash wound with dial antibacterial soap and water prior to dressing change. Peri-Wound Care: Triamcinolone 15 (g) 1 x Per Week/30 Days Discharge Instructions: Use triamcinolone 15 (g) as directed Peri-Wound Care: Sween Lotion (Moisturizing lotion) 1 x Per Week/30 Days Discharge Instructions: Apply moisturizing lotion as directed Topical: Gentamicin 1 x Per Week/30 Days Discharge Instructions: As directed by physician Topical: Mupirocin Ointment 1 x Per Week/30 Days Discharge Instructions: Apply Mupirocin (Bactroban) as instructed Prim Dressing: Hydrofera Blue Ready Transfer Foam, 4x5 (in/in) 1 x Per Week/30 Days ary Discharge Instructions: Apply to wound bed as instructed Secondary Dressing: Woven Gauze Sponge, Non-Sterile 4x4 in 1 x Per Week/30 Days Discharge Instructions: Apply over primary dressing as directed. Com pression Wrap: Unnaboot w/Calamine,  4x10 (in/yd) 1 x Per Week/30 Days Discharge Instructions: Apply Unnaboot as directed. Com pression Stockings: Jobst Farrow Wrap 4000 Compression Amount: 30-40 mmHg (left) Discharge Instructions: Apply Renee Pain daily as instructed. Apply first thing in the morning, remove at night before bed. 07/30/2023: Both right leg ulcers are healed. The proximal medial left leg ulcer is smaller and more superficial. The left distal lateral leg ulcer is still unchanged in size. There is more granulation tissue filling in, but the surface is still fairly fibrotic. Edema control is adequate. I used a curette to debride slough and subcutaneous tissue from both of the left leg ulcers. I am going to add some topical gentamicin and mupirocin to the wounds to see if suppressing normal skin flora will help these wounds progress. In addition, we are going to run an IVR for Grafix and Epicord; I am hopeful 1 of these might be approved to treat the more distal of these 2 wounds. She will follow-up in 1 week. Electronic Signature(s) KENNYA, SCHWENN A ([id number]) [id number] Page 9 of 10 Signed: 07/30/2023 8:33:06 AM By: Duanne Guess MD FACS Entered By: Duanne Guess on 07/30/2023 05:33:06 -------------------------------------------------------------------------------- HxROS Details Patient Name: Date of Service: Joan Officer, [name] 07/30/2023 7:45 A M Medical Record Number: [id number] Patient Account Number: [id number] Date of Birth/Sex: Treating RN: 01-22-1966 (57 y.o. F) Primary Care Provider: PCP, [name]: Referring Provider: Treating Provider/Extender: Sherryl Manges in Treatment: 4 Information Obtained From Patient Hematologic/Lymphatic Medical History: Positive for: Anemia Respiratory Medical History: Positive for: Asthma Cardiovascular Medical History: Positive for: Congestive Heart Failure; Deep Vein  Thrombosis; Hypertension Past Medical History  Notes: Morbidly Obese Musculoskeletal Medical History: Positive for: Osteoarthritis Immunizations Pneumococcal Vaccine: Received Pneumococcal Vaccination: No Implantable Devices None Hospitalization / Surgery History Type of Hospitalization/Surgery Tubal Ligation Family and Social History Unknown History: Yes; Never smoker; Marital Status - Divorced; Alcohol Use: Rarely; Drug Use: No History; Caffeine Use: Daily - Tea,Coffee ,Soda; Financial Concerns: No; Food, Clothing or Shelter Needs: No; Support System Lacking: No; Transportation Concerns: No Electronic Signature(s) Signed: 07/30/2023 9:23:45 AM By: Duanne Guess MD FACS Entered By: Duanne Guess on 07/30/2023 05:18:29 SuperBill Details -------------------------------------------------------------------------------- [name] ([id number]) [id number] Page 10 of 10 Patient Name: Date of Service: [name], [name] 07/30/2023 Medical Record Number: [id number] Patient Account Number: [id number] Date of Birth/Sex: Treating RN: 1966-04-21 (57 y.o. F) Primary Care Provider: PCP, [name]: Referring Provider: Treating Provider/Extender: Sherryl Manges in Treatment: 4 Diagnosis Coding ICD-10 Codes Code Description [id number] Non-pressure chronic ulcer of other part of right lower leg with fat layer exposed L97.822 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osed I87.319 Chronic venous hypertension (idiopathic) with ulcer of unspecified lower extremity I89.0 Lymphedema, not elsewhere classified I50.32 Chronic diastolic (congestive) heart failure E66.01 Morbid (severe) obesity due to excess calories Facility Procedures CPT4 Code Description Modifier Quantity [id number] 11042 - DEB SUBQ TISSUE 20 SQ CM/< 1 ICD-10 Diagnosis Description L97.822 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osed Physician  Procedures Quantity CPT4 Code Description Modifier [id number] - WC PHYS LEVEL 4 - EST PT 1 ICD-10 Diagnosis Description L97.822 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osed I50.32 Chronic diastolic (congestive) heart failure I87.319 Chronic venous hypertension (idiopathic) with ulcer of unspecified lower extremity I89.0 Lymphedema, not elsewhere classified [id number] 11042 - WC PHYS SUBQ TISS 20 SQ CM 1 ICD-10 Diagnosis Description L97.822 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osed Electronic Signature(s) Signed: 07/30/2023 8:33:39 AM By: Duanne Guess MD FACS Entered By: Duanne Guess on 07/30/2023 05:33:39

## 2023-07-30 NOTE — Progress Notes (Signed)
LEIGH, BLAS [name] ([id number]) [id number] Page 1 of 10 Visit Report for 07/30/2023 Arrival Information Details Patient Name: Date of Service: [name], [name]. 07/30/2023 7:45 [name] Medical Record Number: [id number] Patient Account Number: [id number] Date of Birth/Sex: Treating RN: 1966/02/23 (57 y.o. [name] Primary Care [name]: PCP, [name]: Referring Dhruv Christina: Treating Jarrod Mcenery/Extender: Sherryl Manges in Treatment: 4 Visit Information History Since Last Visit Added or deleted any medications: No Patient Arrived: Cane Any new allergies or adverse reactions: No Arrival Time: 07:50 Had [name] fall or experienced change in No Accompanied By: self activities of daily living that may affect Transfer Assistance: None risk of falls: Patient Identification Verified: Yes Signs or symptoms of abuse/neglect since last visito No Secondary Verification Process Completed: Yes Hospitalized since last visit: No Patient Has Alerts: Yes Implantable device outside of the clinic excluding No Patient Alerts: Lasix cellular tissue based products placed in the center ABI R [place] (07/02/23) since last visit: ABI L [place] (07/02/23) Has Dressing in Place as Prescribed: Yes Has Compression in Place as Prescribed: Yes Pain Present Now: No Electronic Signature(s) Signed: 07/30/2023 4:14:29 PM By: Samuella Bruin Entered By: Samuella Bruin on 07/30/2023 04:52:27 -------------------------------------------------------------------------------- Compression Therapy Details Patient Name: Date of Service: Joan Officer, [name] 07/30/2023 7:45 [name] Medical Record Number: [id number] Patient Account Number: [id number] Date of Birth/Sex: Treating RN: 1966/05/06 (57 y.o. [name] Primary Care [name]: PCP, [name]: Referring Edker [name]: Treating [name]/Extender: Sherryl Manges in Treatment: 4 Compression Therapy Performed for Wound  Assessment: Wound #3 Left,Proximal,Medial Lower Leg Performed By: Clinician Samuella Bruin, RN Compression Type: Three Layer Post Procedure Diagnosis Same as Pre-procedure Electronic Signature(s) Signed: 07/30/2023 4:14:29 PM By: Samuella Bruin Entered By: Samuella Bruin on 07/30/2023 05:14:21 [name] ([id number]) [id number] Page 2 of 10 -------------------------------------------------------------------------------- Encounter Discharge Information Details Patient Name: Date of Service: [name], [name]. 07/30/2023 7:45 [name] Medical Record Number: [id number] Patient Account Number: [id number] Date of Birth/Sex: Treating RN: 02/01/1966 (57 y.o. [name] Primary Care [name]: PCP, [name]: Referring [name]: Treating [name]/Extender: Sherryl Manges in Treatment: 4 Encounter Discharge Information Items Post Procedure Vitals Discharge Condition: Stable Temperature (F): 98 Ambulatory Status: Cane Pulse (bpm): 111 Discharge Destination: Home Respiratory Rate (breaths/min): 18 Transportation: Private Auto Blood Pressure (mmHg): 196/98 Accompanied By: self Schedule Follow-up Appointment: Yes Clinical Summary of Care: Patient Declined Electronic Signature(s) Signed: 07/30/2023 4:14:29 PM By: Samuella Bruin Entered By: Samuella Bruin on 07/30/2023 06:07:30 -------------------------------------------------------------------------------- Lower Extremity Assessment Details Patient Name: Date of Service: Joan Officer, [name] 07/30/2023 7:45 [name] Medical Record Number: [id number] Patient Account Number: [id number] Date of Birth/Sex: Treating RN: 09-04-1966 (57 y.o. [name] Primary Care [name]: PCP, [name]: Referring [name]: Treating [name]/Extender: Sherryl Manges in Treatment: 4 Edema Assessment Assessed: [Left: No] [Right: No] [Left: Edema] [Right: :] Calf Left:  Right: Point of Measurement: 32 cm From Medial Instep 47 cm 53.3 cm Ankle Left: Right: Point of Measurement: 10 cm From Medial Instep 30.5 cm 28 cm Vascular Assessment Pulses: Dorsalis Pedis Palpable: [Left:Yes] [Right:Yes] Extremity colors, hair growth, and conditions: Extremity Color: [Left:Hyperpigmented] [Right:Hyperpigmented] Hair Growth on Extremity: [Left:No] [Right:No] Temperature of Extremity: [Left:Warm] [Right:Warm] Capillary Refill: [Left:< 3 seconds] [Right:< 3 seconds] Dependent Rubor: [Left:No No] [Right:No No] Electronic Signature(s) Signed: 07/30/2023 4:14:29 PM By: Samuella Bruin Entered By: Samuella Bruin on 07/30/2023 05:03:03 [name] ([id number]) [id number] Page 3 of 10 --------------------------------------------------------------------------------  Multi Wound Chart Details Patient Name: Date of Service: [name], [name]. 07/30/2023 7:45 [name] Medical Record Number: [id number] Patient Account Number: [id number] Date of Birth/Sex: Treating RN: Jan 18, 1966 (57 y.o. F) Primary Care Arabela Basaldua: PCP, [name]: Referring [name]: Treating [name]/Extender: Sherryl Manges in Treatment: 4 Vital Signs Height(in): 71 Pulse(bpm): 111 Weight(lbs): 352 Blood Pressure(mmHg): 196/98 Body Mass Index(BMI): 49.1 Temperature(F): 98 Respiratory Rate(breaths/min): 18 [2:Photos:] Right, Lateral Lower Leg Left, Proximal, Medial Lower Leg Left, Lateral Lower Leg Wound Location: Gradually Appeared Gradually Appeared Gradually Appeared Wounding Event: Lymphedema Lymphedema Lymphedema Primary Etiology: Anemia, Asthma, Congestive Heart Anemia, Asthma, Congestive Heart Anemia, Asthma, Congestive Heart Comorbid History: Failure, Deep Vein Thrombosis, Failure, Deep Vein Thrombosis, Failure, Deep Vein Thrombosis, Hypertension, Osteoarthritis Hypertension, Osteoarthritis Hypertension, Osteoarthritis 07/01/2021 04/01/2023  08/09/2020 Date Acquired: 4 4 4  Weeks of Treatment: Healed - Epithelialized Open Open Wound Status: No No No Wound Recurrence: Yes No No Clustered Wound: 0x0x0 0.6x1x0.1 1.8x1.5x0.2 Measurements L x W x D (cm) 0 0.471 2.121 [name] (cm) : rea 0 0.047 0.424 Volume (cm) : 100.00% 88.00% 41.30% % Reduction in [name] rea: 100.00% 96.00% 60.90% % Reduction in Volume: Full Thickness Without Exposed Full Thickness Without Exposed Full Thickness Without Exposed Classification: Support Structures Support Structures Support Structures None Present Medium Medium Exudate [name] mount: N/[name] Serosanguineous Serosanguineous Exudate Type: N/[name] red, brown red, brown Exudate Color: Distinct, outline attached Distinct, outline attached Distinct, outline attached Wound Margin: None Present (0%) Large (67-100%) Medium (34-66%) Granulation [name] mount: N/[name] Red Red, Pink Granulation Quality: None Present (0%) Small (1-33%) Medium (34-66%) Necrotic [name] mount: Fascia: No Fat Layer (Subcutaneous Tissue): Yes Fat Layer (Subcutaneous Tissue): Yes Exposed Structures: Fat Layer (Subcutaneous Tissue): No Fascia: No Fascia: No Tendon: No Tendon: No Tendon: No Muscle: No Muscle: No Muscle: No Joint: No Joint: No Joint: No Bone: No Bone: No Bone: No Large (67-100%) Medium (34-66%) Small (1-33%) Epithelialization: N/[name] Debridement - Excisional Debridement - Excisional Debridement: Pre-procedure Verification/Time Out N/[name] 08:12 08:12 Taken: N/[name] Lidocaine 4% Topical Solution Lidocaine 4% Topical Solution Pain Control: N/[name] Subcutaneous, Slough Subcutaneous, Slough Tissue Debrided: N/[name] Skin/Subcutaneous Tissue Skin/Subcutaneous Tissue Level: N/[name] 0.47 2.12 Debridement [name] (sq cm): rea N/[name] Curette Curette Instrument: N/[name] Minimum Minimum Bleeding: N/[name] Pressure Pressure Hemostasis [name] chieved: N/[name] Procedure was tolerated well Procedure was tolerated well Debridement Treatment Response: N/[name] 0.6x1x0.1  1.8x1.5x0.2 Post Debridement Measurements L x W x D (cm) N/[name] 0.047 0.424 Post Debridement Volume: (cm) Scarring: Yes No Abnormalities Noted Scarring: Yes Periwound Skin TextureLATONJA, [name] ([id number]) [phone].pdf Page 4 of 10 No Abnormalities Noted No Abnormalities Noted No Abnormalities Noted Periwound Skin Moisture: Hemosiderin Staining: Yes Hemosiderin Staining: Yes Hemosiderin Staining: Yes Periwound Skin Color: No Abnormality No Abnormality No Abnormality Temperature: N/[name] Compression Therapy Debridement Procedures Performed: Debridement Treatment Notes Electronic Signature(s) Signed: 07/30/2023 8:17:11 AM By: Duanne Guess MD FACS Entered By: Duanne Guess on 07/30/2023 05:17:11 -------------------------------------------------------------------------------- Multi-Disciplinary Care Plan Details Patient Name: Date of Service: Joan Officer, [name] 07/30/2023 7:45 [name] Medical Record Number: [id number] Patient Account Number: [id number] Date of Birth/Sex: Treating RN: 01-Jul-1966 (57 y.o. [name] Primary Care Jessamine Barcia: PCP, [name]: Referring Andra Heslin: Treating Coralyn Roselli/Extender: Sherryl Manges in Treatment: 4 Active Inactive Wound/Skin Impairment Nursing Diagnoses: Impaired tissue integrity Goals: Patient/caregiver will verbalize understanding of skin care regimen Date Initiated: 07/02/2023 Target Resolution Date: 09/25/2023 Goal Status: Active Interventions: Assess ulceration(s) every visit Treatment Activities: Skin care regimen initiated : 07/02/2023 Notes: Electronic Signature(s)  Signed: 07/30/2023 4:14:29 PM By: Gelene Mink By: Samuella Bruin on 07/30/2023 05:13:48 -------------------------------------------------------------------------------- Pain Assessment Details Patient Name: Date of Service: Joan Officer, [name] 07/30/2023 7:45 [name] Medical Record Number: [id number] Patient  Account Number: [id number] Date of Birth/Sex: Treating RN: [birth year] (57 y.o. [name] Primary Care Charlean Carneal: PCP, [name]: Referring Shandelle Borrelli: Treating Jayon Matton/Extender: Sherryl Manges in Treatment: 4 Active Problems Location of Pain Severity and Description of Pain [name], [name] ([id number]) [id number].pdf Page 5 of 10 Patient Has Paino No Site Locations Rate the pain. Current Pain Level: 0 Pain Management and Medication Current Pain Management: Electronic Signature(s) Signed: 07/30/2023 4:14:29 PM By: Samuella Bruin Entered By: Samuella Bruin on 07/30/2023 04:52:34 -------------------------------------------------------------------------------- Patient/Caregiver Education Details Patient Name: Date of Service: Joan Officer, [name] 11/4/2024andnbsp7:45 [name] Medical Record Number: [id number] Patient Account Number: [id number] Date of Birth/Gender: Treating RN: [birth year] (57 y.o. [name] Primary Care Physician: PCP, [name]: Referring Physician: Treating Physician/Extender: Sherryl Manges in Treatment: 4 Education Assessment Education Provided To: Patient Education Topics Provided Wound/Skin Impairment: Methods: Explain/Verbal Responses: Reinforcements needed, State content correctly Electronic Signature(s) Signed: 07/30/2023 4:14:29 PM By: Samuella Bruin Entered By: Samuella Bruin on 07/30/2023 05:13:55 -------------------------------------------------------------------------------- Wound Assessment Details Patient Name: Date of Service: Joan Officer, [name] 07/30/2023 7:45 [name] Medical Record Number: [id number] Patient Account Number: [id number] [name], [name] ([id number]) [phone].pdf Page 6 of 10 Date of Birth/Sex: Treating RN: 03-19-1966 (57 y.o. [name] Primary Care Satia Winger: PCP, [name]: Referring Joleena Weisenburger: Treating  Senai Kingsley/Extender: Sherryl Manges in Treatment: 4 Wound Status Wound Number: 2 Primary Lymphedema Etiology: Wound Location: Right, Lateral Lower Leg Wound Healed - Epithelialized Wounding Event: Gradually Appeared Status: Date Acquired: 07/01/2021 Comorbid Anemia, Asthma, Congestive Heart Failure, Deep Vein Weeks Of Treatment: 4 History: Thrombosis, Hypertension, Osteoarthritis Clustered Wound: Yes Photos Wound Measurements Length: (cm) Width: (cm) Depth: (cm) Area: (cm) Volume: (cm) 0 % Reduction in Area: 100% 0 % Reduction in Volume: 100% 0 Epithelialization: Large (67-100%) 0 Tunneling: No 0 Undermining: No Wound Description Classification: Full Thickness Without Exposed Support Structures Wound Margin: Distinct, outline attached Exudate Amount: None Present Foul Odor After Cleansing: No Slough/Fibrino No Wound Bed Granulation Amount: None Present (0%) Exposed Structure Necrotic Amount: None Present (0%) Fascia Exposed: No Fat Layer (Subcutaneous Tissue) Exposed: No Tendon Exposed: No Muscle Exposed: No Joint Exposed: No Bone Exposed: No Periwound Skin Texture Texture Color No Abnormalities Noted: Yes No Abnormalities Noted: No Hemosiderin Staining: Yes Moisture No Abnormalities Noted: Yes Temperature / Pain Temperature: No Abnormality Treatment Notes Wound #2 (Lower Leg) Wound Laterality: Right, Lateral Cleanser Peri-Wound Care Topical Primary Dressing Secondary Dressing Secured With Compression Wrap Compression Stockings Add-Ons [name], [name] ([id number]) [phone].pdf Page 7 of 10 Electronic Signature(s) Signed: 07/30/2023 4:14:29 PM By: Samuella Bruin Entered By: Samuella Bruin on 07/30/2023 05:12:33 -------------------------------------------------------------------------------- Wound Assessment Details Patient Name: Date of Service: Joan Officer, [name] 07/30/2023 7:45 [name] Medical Record Number:  [id number] Patient Account Number: [id number] Date of Birth/Sex: Treating RN: 1966-07-16 (57 y.o. [name] Primary Care Jahleah Mariscal: PCP, [name]: Referring Vidal Lampkins: Treating Damyah Gugel/Extender: Sherryl Manges in Treatment: 4 Wound Status Wound Number: 3 Primary Lymphedema Etiology: Wound Location: Left, Proximal, Medial Lower Leg Wound Open Wounding Event: Gradually Appeared Status: Date Acquired: 04/01/2023 Comorbid Anemia, Asthma, Congestive Heart Failure, Deep Vein Weeks Of Treatment: 4 History: Thrombosis, Hypertension, Osteoarthritis Clustered Wound: No Photos Wound Measurements Length: (  cm) 0.6 Width: (cm) 1 Depth: (cm) 0.1 Area: (cm) 0.471 Volume: (cm) 0.047 % Reduction in Area: 88% % Reduction in Volume: 96% Epithelialization: Medium (34-66%) Tunneling: No Undermining: No Wound Description Classification: Full Thickness Without Exposed Support Structures Wound Margin: Distinct, outline attached Exudate Amount: Medium Exudate Type: Serosanguineous Exudate Color: red, brown Foul Odor After Cleansing: No Slough/Fibrino Yes Wound Bed Granulation Amount: Large (67-100%) Exposed Structure Granulation Quality: Red Fascia Exposed: No Necrotic Amount: Small (1-33%) Fat Layer (Subcutaneous Tissue) Exposed: Yes Necrotic Quality: Adherent Slough Tendon Exposed: No Muscle Exposed: No Joint Exposed: No Bone Exposed: No Periwound Skin Texture Texture Color No Abnormalities Noted: Yes No Abnormalities Noted: No Hemosiderin Staining: Yes Moisture No Abnormalities Noted: Yes Temperature / Pain Temperature: No Abnormality [name], [name] ([id number]) [phone].pdf Page 8 of 10 Treatment Notes Wound #3 (Lower Leg) Wound Laterality: Left, Medial, Proximal Cleanser Soap and Water Discharge Instruction: At Wound center-May shower and wash wound with dial antibacterial soap and water prior to dressing change. Peri-Wound  Care Triamcinolone 15 (g) Discharge Instruction: Use triamcinolone 15 (g) as directed Sween Lotion (Moisturizing lotion) Discharge Instruction: Apply moisturizing lotion as directed Topical Gentamicin Discharge Instruction: As directed by physician Mupirocin Ointment Discharge Instruction: Apply Mupirocin (Bactroban) as instructed Primary Dressing Hydrofera Blue Ready Transfer Foam, 4x5 (in/in) Discharge Instruction: Apply to wound bed as instructed Secondary Dressing Woven Gauze Sponge, Non-Sterile 4x4 in Discharge Instruction: Apply over primary dressing as directed. Secured With Compression Wrap Unnaboot w/Calamine, 4x10 (in/yd) Discharge Instruction: Apply Unnaboot as directed. Compression Stockings Add-Ons Electronic Signature(s) Signed: 07/30/2023 4:14:29 PM By: Samuella Bruin Entered By: Samuella Bruin on 07/30/2023 05:06:49 -------------------------------------------------------------------------------- Wound Assessment Details Patient Name: Date of Service: Joan Officer, [name] 07/30/2023 7:45 [name] Medical Record Number: [id number] Patient Account Number: [id number] Date of Birth/Sex: Treating RN: July 18, 1966 (57 y.o. [name] Primary Care Evellyn Tuff: PCP, [name]: Referring Torina Ey: Treating Yaileen Hofferber/Extender: Sherryl Manges in Treatment: 4 Wound Status Wound Number: 6 Primary Lymphedema Etiology: Wound Location: Left, Lateral Lower Leg Wound Open Wounding Event: Gradually Appeared Status: Date Acquired: 08/09/2020 Comorbid Anemia, Asthma, Congestive Heart Failure, Deep Vein Weeks Of Treatment: 4 History: Thrombosis, Hypertension, Osteoarthritis Clustered Wound: No Photos [name], [name] ([id number]) [id number] Page 9 of 10 Wound Measurements Length: (cm) 1.8 Width: (cm) 1.5 Depth: (cm) 0.2 Area: (cm) 2.121 Volume: (cm) 0.424 % Reduction in Area: 41.3% % Reduction in Volume:  60.9% Epithelialization: Small (1-33%) Tunneling: No Undermining: No Wound Description Classification: Full Thickness Without Exposed Support Structures Wound Margin: Distinct, outline attached Exudate Amount: Medium Exudate Type: Serosanguineous Exudate Color: red, brown Foul Odor After Cleansing: No Slough/Fibrino Yes Wound Bed Granulation Amount: Medium (34-66%) Exposed Structure Granulation Quality: Red, Pink Fascia Exposed: No Necrotic Amount: Medium (34-66%) Fat Layer (Subcutaneous Tissue) Exposed: Yes Necrotic Quality: Adherent Slough Tendon Exposed: No Muscle Exposed: No Joint Exposed: No Bone Exposed: No Periwound Skin Texture Texture Color No Abnormalities Noted: Yes No Abnormalities Noted: No Hemosiderin Staining: Yes Moisture No Abnormalities Noted: Yes Temperature / Pain Temperature: No Abnormality Treatment Notes Wound #6 (Lower Leg) Wound Laterality: Left, Lateral Cleanser Soap and Water Discharge Instruction: At Wound center-May shower and wash wound with dial antibacterial soap and water prior to dressing change. Peri-Wound Care Triamcinolone 15 (g) Discharge Instruction: Use triamcinolone 15 (g) as directed Sween Lotion (Moisturizing lotion) Discharge Instruction: Apply moisturizing lotion as directed Topical Gentamicin Discharge Instruction: As directed by physician Mupirocin Ointment Discharge Instruction: Apply Mupirocin (Bactroban) as instructed Primary Dressing Hydrofera Blue  Ready Transfer Foam, 4x5 (in/in) Discharge Instruction: Apply to wound bed as instructed Secondary Dressing Woven Gauze Sponge, Non-Sterile 4x4 in Discharge Instruction: Apply over primary dressing as directed. Secured With [name], [name] ([id number]) [id number] Page 10 of 10 Compression Wrap Unnaboot w/Calamine, 4x10 (in/yd) Discharge Instruction: Apply Unnaboot as directed. Compression Stockings Jobst Farrow Wrap 4000 Quantity: 1 Left  Leg Compression Amount: 30-40 mmHg Discharge Instruction: Apply Renee Pain daily as instructed. Apply first thing in the morning, remove at night before bed. Add-Ons Electronic Signature(s) Signed: 07/30/2023 4:14:29 PM By: Samuella Bruin Entered By: Samuella Bruin on 07/30/2023 05:07:11 -------------------------------------------------------------------------------- Vitals Details Patient Name: Date of Service: Joan Officer, [name] 07/30/2023 7:45 [name] Medical Record Number: [id number] Patient Account Number: [id number] Date of Birth/Sex: Treating RN: Jan 23, 1966 (57 y.o. [name] Primary Care Rachna Schonberger: PCP, [name]: Referring Arrionna Serena: Treating Olivya Sobol/Extender: Sherryl Manges in Treatment: 4 Vital Signs Time Taken: 07:52 Temperature (F): 98 Height (in): 71 Pulse (bpm): 111 Weight (lbs): 352 Respiratory Rate (breaths/min): 18 Body Mass Index (BMI): 49.1 Blood Pressure (mmHg): 196/98 Reference Range: 80 - 120 mg / dl Electronic Signature(s) Signed: 07/30/2023 4:14:29 PM By: Samuella Bruin Entered By: Samuella Bruin on 07/30/2023 04:55:01

## 2023-07-31 ENCOUNTER — Ambulatory Visit (HOSPITAL_BASED_OUTPATIENT_CLINIC_OR_DEPARTMENT_OTHER): Payer: Medicare HMO | Admitting: General Surgery

## 2023-08-06 ENCOUNTER — Ambulatory Visit (HOSPITAL_BASED_OUTPATIENT_CLINIC_OR_DEPARTMENT_OTHER): Payer: Medicare HMO | Admitting: General Surgery

## 2023-08-07 ENCOUNTER — Encounter (HOSPITAL_BASED_OUTPATIENT_CLINIC_OR_DEPARTMENT_OTHER): Payer: Medicare HMO | Admitting: General Surgery

## 2023-08-07 DIAGNOSIS — L97822 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osed: Secondary | ICD-10-CM | POA: Diagnosis not present

## 2023-08-07 DIAGNOSIS — I890 Lymphedema, not elsewhere classified: Secondary | ICD-10-CM | POA: Diagnosis not present

## 2023-08-07 NOTE — Progress Notes (Signed)
[name] ([id number]) [id number] Page 1 of 12 Visit Report for 08/07/2023 Chief Complaint Document Details Patient Name: Date of Service: [name], [name] 08/07/2023 2:00 PM Medical Record Number: [id number] Patient Account Number: [id number] Date of Birth/Sex: Treating RN: 07-Jun-1966 (57 y.o. F) Primary Care Provider: PCP, [name]: Referring Provider: Treating Provider/Extender: Sherryl Manges in Treatment: 5 Information Obtained from: Patient Chief Complaint Patient presents for treatment of open ulcers due to lymphedema Electronic Signature(s) Signed: 08/07/2023 2:43:20 PM By: Duanne Guess MD FACS Entered By: Duanne Guess on 08/07/2023 11:43:20 -------------------------------------------------------------------------------- Cellular or Tissue Based Product Details Patient Name: Date of Service: Joan Officer, [name] 08/07/2023 2:00 PM Medical Record Number: [id number] Patient Account Number: [id number] Date of Birth/Sex: Treating RN: December 12, 1965 (56 y.o. Fredderick Phenix Primary Care Provider: PCP, [name]: Referring Provider: Treating Provider/Extender: Sherryl Manges in Treatment: 5 Cellular or Tissue Based Product Type Wound #3 Left,Proximal,Medial Lower Leg Applied to: Performed By: Physician Duanne Guess, MD The following information was scribed by: Samuella Bruin The information was scribed for: Duanne Guess Cellular or Tissue Based Product Type: Epifix Level of Consciousness (Pre-procedure): Awake and Alert Pre-procedure Verification/Time Out Yes - 14:31 Taken: Location: trunk / arms / legs Wound Size (sq cm): 1 Product Size (sq cm): 3 Waste Size (sq cm): 0 Amount of Product Applied (sq cm): 3 Instrument Used: Forceps, Scissors Lot #: [id number] Expiration Date: 02/24/2028 Fenestrated: No Reconstituted: Yes Solution Type: normal saline Solution Amount: 4 ml Lot  #: [id number] [place] Solution Expiration Date: 01/13/2025 Secured: Yes Secured With: Steri-Strips Dressing Applied: Yes Primary Dressing: adaptic Procedural Pain: 0 Post Procedural Pain: 0 Response to Treatment: Procedure was tolerated well SHANELE, BELONGIA [name] ([id number]) [id number].pdf Page 2 of 12 Level of Consciousness (Post- Awake and Alert procedure): Post Procedure Diagnosis Same as Pre-procedure Electronic Signature(s) Signed: 08/07/2023 2:54:12 PM By: Duanne Guess MD FACS Signed: 08/07/2023 3:33:50 PM By: Gelene Mink By: Samuella Bruin on 08/07/2023 11:36:22 -------------------------------------------------------------------------------- Cellular or Tissue Based Product Details Patient Name: Date of Service: Joan Officer, [name] 08/07/2023 2:00 PM Medical Record Number: [id number] Patient Account Number: [id number] Date of Birth/Sex: Treating RN: [birth year] (57 y.o. Fredderick Phenix Primary Care Provider: PCP, [name]: Referring Provider: Treating Provider/Extender: Sherryl Manges in Treatment: 5 Cellular or Tissue Based Product Type Wound #6 Left,Lateral Lower Leg Applied to: Performed By: Physician Duanne Guess, MD The following information was scribed by: Samuella Bruin The information was scribed for: Duanne Guess Cellular or Tissue Based Product Type: Epifix Level of Consciousness (Pre-procedure): Awake and Alert Pre-procedure Verification/Time Out Yes - 14:31 Taken: Location: trunk / arms / legs Wound Size (sq cm): 2.38 Product Size (sq cm): 3 Waste Size (sq cm): 0 Amount of Product Applied (sq cm): 3 Instrument Used: Forceps, Scissors Lot #: [id number] Expiration Date: 02/24/2028 Fenestrated: No Reconstituted: Yes Solution Type: normal saline Solution Amount: 4 ml Lot #: [id number] KS Solution Expiration Date: 01/13/2025 Secured: Yes Secured With: Steri-Strips Dressing Applied:  Yes Primary Dressing: adaptic Procedural Pain: 0 Post Procedural Pain: 0 Response to Treatment: Procedure was tolerated well Level of Consciousness (Post- Awake and Alert procedure): Post Procedure Diagnosis Same as Pre-procedure Electronic Signature(s) Signed: 08/07/2023 2:54:12 PM By: Duanne Guess MD FACS Signed: 08/07/2023 3:33:50 PM By: Samuella Bruin Entered By: Samuella Bruin on 08/07/2023 11:36:30 [name] ([id number]) [phone].pdf Page 3 of 12 -------------------------------------------------------------------------------- Debridement Details Patient Name: Date of Service: [name] LSTO Dorris Carnes, [name] 08/07/2023  2:00 PM Medical Record Number: [id number] Patient Account Number: [id number] Date of Birth/Sex: Treating RN: 1965/11/15 (57 y.o. Fredderick Phenix Primary Care Provider: PCP, [name]: Referring Provider: Treating Provider/Extender: Sherryl Manges in Treatment: 5 Debridement Performed for Assessment: Wound #3 Left,Proximal,Medial Lower Leg Performed By: Physician Duanne Guess, MD The following information was scribed by: Samuella Bruin The information was scribed for: Duanne Guess Debridement Type: Debridement Level of Consciousness (Pre-procedure): Awake and Alert Pre-procedure Verification/Time Out Yes - 14:29 Taken: Start Time: 14:29 Pain Control: Lidocaine 4% T opical Solution Percent of Wound Bed Debrided: 100% T Area Debrided (cm): otal 0.78 Tissue and other material debrided: Non-Viable, Slough, Subcutaneous, Slough Level: Skin/Subcutaneous Tissue Debridement Description: Excisional Instrument: Curette Bleeding: Minimum Hemostasis Achieved: Pressure Response to Treatment: Procedure was tolerated well Level of Consciousness (Post- Awake and Alert procedure): Post Debridement Measurements of Total Wound Length: (cm) 1 Width: (cm) 1 Depth: (cm) 0.1 Volume: (cm) 0.079 Character of  Wound/Ulcer Post Debridement: Improved Post Procedure Diagnosis Same as Pre-procedure Electronic Signature(s) Signed: 08/07/2023 2:54:12 PM By: Duanne Guess MD FACS Signed: 08/07/2023 3:33:50 PM By: Samuella Bruin Entered By: Samuella Bruin on 08/07/2023 11:29:55 -------------------------------------------------------------------------------- Debridement Details Patient Name: Date of Service: Joan Officer, [name] 08/07/2023 2:00 PM Medical Record Number: [id number] Patient Account Number: [id number] Date of Birth/Sex: Treating RN: 1966/01/12 (57 y.o. Fredderick Phenix Primary Care Provider: PCP, [name]: Referring Provider: Treating Provider/Extender: Sherryl Manges in Treatment: 5 Debridement Performed for Assessment: Wound #6 Left,Lateral Lower Leg Performed By: Physician Duanne Guess, MD The following information was scribed by: Samuella Bruin The information was scribed for: Duanne Guess Debridement Type: Debridement Level of Consciousness (Pre-procedure): Awake and Alert Pre-procedure Verification/Time Out Yes - 14:29 Taken: MYRIKAL, NEUHART ([id number]) [id number] Page 4 of 12 Start Time: 14:29 Pain Control: Lidocaine 4% T opical Solution Percent of Wound Bed Debrided: 100% T Area Debrided (cm): otal 1.87 Tissue and other material debrided: Non-Viable, Slough, Subcutaneous, Slough Level: Skin/Subcutaneous Tissue Debridement Description: Excisional Instrument: Curette Bleeding: Minimum Hemostasis Achieved: Pressure Response to Treatment: Procedure was tolerated well Level of Consciousness (Post- Awake and Alert procedure): Post Debridement Measurements of Total Wound Length: (cm) 1.7 Width: (cm) 1.4 Depth: (cm) 0.4 Volume: (cm) 0.748 Character of Wound/Ulcer Post Debridement: Improved Post Procedure Diagnosis Same as Pre-procedure Electronic Signature(s) Signed: 08/07/2023 2:54:12 PM By:  Duanne Guess MD FACS Signed: 08/07/2023 3:33:50 PM By: Samuella Bruin Entered By: Samuella Bruin on 08/07/2023 11:30:15 -------------------------------------------------------------------------------- HPI Details Patient Name: Date of Service: Joan Officer, [name] 08/07/2023 2:00 PM Medical Record Number: [id number] Patient Account Number: [id number] Date of Birth/Sex: Treating RN: Jan 05, 1966 (57 y.o. F) Primary Care Provider: PCP, [name]: Referring Provider: Treating Provider/Extender: Sherryl Manges in Treatment: 5 History of Present Illness HPI Description: ADMISSION 07/02/2023 ***ABIs non-compressible bilaterally; triphasic Doppler signals*** This is [name] 57 year old morbidly obese patient who is not [name] diabetic. She does have hypertension and CHF. Although she does not carry this diagnosis in her electronic medical record, she clearly has lymphedema. She has multiple wounds on her bilateral lower extremities, 1 of which has been present for 3 years. She has been treating her wounds with peroxide and bacitracin. Her provider has given her various courses of antibiotics, but the wounds have not closed. She was told not to wear compression stockings due to her history of DVT but this was well over [name] year ago. She has never worn lymphedema pumps. , 07/09/2023: Several of the smaller  wounds have closed. Remaining wounds include the left proximal medial ulcer and the left lateral distal ulcer, as well as the paired wounds on her right lateral lower leg. There is slough accumulation at all sites. 07/16/2023: The left distal lateral leg ulcer is [name] little bit smaller and quite [name] bit cleaner. The more proximal medial left lower leg ulcer still has [name] fair amount of nonviable tissue present and this more tender. Her wrap did slide down and it appears that it came to rest right in the middle of this wound causing some pressure on the site. The paired right lateral lower leg  ulcers are small and superficial with [name] little bit of slough and eschar present. 07/23/2023: The right lateral lower leg ulcers are nearly closed but remain just barely open underneath [name] layer of eschar. The proximal medial left lower leg ulcer has less nonviable tissue present today and is smaller. The left distal lateral leg ulcer is unchanged in size, but the surface is clean and the quality of the tissue is improving. 07/30/2023: Both right leg ulcers are healed. The proximal medial left leg ulcer is smaller and more superficial. The left distal lateral leg ulcer is still unchanged in size. There is more granulation tissue filling in, but the surface is still fairly fibrotic. Edema control is adequate. 08/07/2023: She unfortunately had to travel out of town urgently to go to [place] this past week and as [name] result, was on her feet and with her legs in [name] dependent position quite [name] bit. This has resulted in the medial leg ulcer getting [name] little bit larger and the lateral leg ulcer getting [name] little bit deeper. Her juxta lite stockings were delivered and she has them with her today. She has been approved for Epifix so we will proceed with application today. Electronic Signature(s) Signed: 08/07/2023 2:44:48 PM By: Duanne Guess MD FACS [name] ([id number]) [id number] Page 5 of 12 Entered By: Duanne Guess on 08/07/2023 11:44:47 -------------------------------------------------------------------------------- Physical Exam Details Patient Name: Date of Service: [name], [name] 08/07/2023 2:00 PM Medical Record Number: [id number] Patient Account Number: [id number] Date of Birth/Sex: Treating RN: 1966/03/25 (57 y.o. F) Primary Care Provider: PCP, [name]: Referring Provider: Treating Provider/Extender: Sherryl Manges in Treatment: 5 Constitutional Hypertensive, asymptomatic. Tachycardic, asymptomatic. . . no acute  distress. Respiratory Normal work of breathing on room air.. Notes 08/07/2023: She unfortunately had to travel out of town urgently to go to [place] this past week and as [name] result, was on her feet and with her legs in [name] dependent position quite [name] bit. This has resulted in the medial leg ulcer getting [name] little bit larger and the lateral leg ulcer getting [name] little bit deeper. Electronic Signature(s) Signed: 08/07/2023 2:47:08 PM By: Duanne Guess MD FACS Entered By: Duanne Guess on 08/07/2023 11:47:08 -------------------------------------------------------------------------------- Physician Orders Details Patient Name: Date of Service: Joan Officer, [name] 08/07/2023 2:00 PM Medical Record Number: [id number] Patient Account Number: [id number] Date of Birth/Sex: Treating RN: 1965-12-27 (57 y.o. Fredderick Phenix Primary Care Provider: PCP, [name]: Referring Provider: Treating Provider/Extender: Sherryl Manges in Treatment: 5 The following information was scribed by: Samuella Bruin The information was scribed for: Duanne Guess Verbal / Phone Orders: No Diagnosis Coding ICD-10 Coding Code Description [phone] 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osed I87.319 Chronic venous hypertension (idiopathic) with ulcer of unspecified lower extremity I89.0 Lymphedema, not elsewhere  classified I50.32 Chronic diastolic (congestive) heart failure E66.01 Morbid (severe) obesity due to excess calories Follow-up Appointments ppointment in 1 week. - Dr. Lady Gary Room 3 Return [name] Anesthetic (In clinic) Topical Lidocaine 4% applied to wound bed Cellular or Tissue Based Products Cellular or Tissue Based Product Type: - Epifix #1 used on lateral lower leg wound and leftover product used for the medial lower leg wound Cellular or Tissue Based Product applied to wound bed, secured with steri-strips, cover with Adaptic or Mepitel.  (DO NOT REMOVE). DELVIA, ASANO [name] ([id number]) [id number] Page 6 of 12 Bathing/ Shower/ Hygiene May shower with protection but do not get wound dressing(s) wet. Protect dressing(s) with water repellant cover (for example, large plastic bag) or [name] cast cover and may then take shower. - Keep legs dry until next appointment. Use Cast Protectors if so desired. Other Bathing/Shower/Hygiene Orders/Instructions: - May want to sponge bath if legs cannot be kept dry until next appointment. Additional Orders / Instructions Follow Nutritious Diet - Try and increase Protein intake. The goal is 70g-100g per day. Examples are Chicken,fish.meat,pork, [place] yogurt ,eggs etc. Lymphedema Treatment Plan - Exercise, Compression and Elevation Exercise daily as tolerated. (Walking, ROM, Calf Pumps and Toe Taps) Compression Wraps as ordered Elevate legs 30 - 60 minutes at or above heart level at least 3 - 4 times daily as able/tolerated Avoid standing for long periods and elevate leg(s) parallel to the floor when sitting Wound Treatment Wound #3 - Lower Leg Wound Laterality: Left, Medial, Proximal Cleanser: Soap and Water 1 x Per Week/30 Days Discharge Instructions: At Wound center-May shower and wash wound with dial antibacterial soap and water prior to dressing change. Peri-Wound Care: Triamcinolone 15 (g) 1 x Per Week/30 Days Discharge Instructions: Use triamcinolone 15 (g) as directed Peri-Wound Care: Sween Lotion (Moisturizing lotion) 1 x Per Week/30 Days Discharge Instructions: Apply moisturizing lotion as directed Topical: Gentamicin 1 x Per Week/30 Days Discharge Instructions: As directed by physician Topical: Mupirocin Ointment 1 x Per Week/30 Days Discharge Instructions: Apply Mupirocin (Bactroban) as instructed Prim Dressing: ADAPTIC TOUCH 3x4.25 (in/in) 1 x Per Week/30 Days ary Discharge Instructions: Apply to wound bed as instructed Secondary Dressing: Woven Gauze Sponge,  Non-Sterile 4x4 in 1 x Per Week/30 Days Discharge Instructions: Apply over primary dressing as directed. Compression Wrap: Unnaboot w/Calamine, 4x10 (in/yd) 1 x Per Week/30 Days Discharge Instructions: Apply Unnaboot as directed. Wound #6 - Lower Leg Wound Laterality: Left, Lateral Cleanser: Soap and Water 1 x Per Week/30 Days Discharge Instructions: At Wound center-May shower and wash wound with dial antibacterial soap and water prior to dressing change. Peri-Wound Care: Triamcinolone 15 (g) 1 x Per Week/30 Days Discharge Instructions: Use triamcinolone 15 (g) as directed Peri-Wound Care: Sween Lotion (Moisturizing lotion) 1 x Per Week/30 Days Discharge Instructions: Apply moisturizing lotion as directed Topical: Gentamicin 1 x Per Week/30 Days Discharge Instructions: As directed by physician Topical: Mupirocin Ointment 1 x Per Week/30 Days Discharge Instructions: Apply Mupirocin (Bactroban) as instructed Prim Dressing: ADAPTIC TOUCH 3x4.25 (in/in) 1 x Per Week/30 Days ary Discharge Instructions: Apply to wound bed as instructed Prim Dressing: Hydrofera Blue Ready Transfer Foam, 4x5 (in/in) 1 x Per Week/30 Days ary Discharge Instructions: Apply to wound bed as instructed Secondary Dressing: Woven Gauze Sponge, Non-Sterile 4x4 in 1 x Per Week/30 Days Discharge Instructions: Apply over primary dressing as directed. Compression Wrap: Unnaboot w/Calamine, 4x10 (in/yd) 1 x Per Week/30 Days Discharge Instructions: Apply Unnaboot as directed. Compression Stockings: Jobst Farrow Wrap 4000 Left Leg Compression  Amount: 30-40 mmHG Discharge Instructions: Apply Renee Pain daily as instructed. Apply first thing in the morning, remove at night before bed. Patient Medications llergies: No Known Allergies [name] ALICEMAE, ROED [name] ([id number]) [id number] Page 7 of 12 Notifications Medication Indication Start End 08/07/2023 lidocaine DOSE topical 4 % cream - cream  topical Electronic Signature(s) Signed: 08/07/2023 2:54:12 PM By: Duanne Guess MD FACS Entered By: Duanne Guess on 08/07/2023 11:47:35 -------------------------------------------------------------------------------- Problem List Details Patient Name: Date of Service: Joan Officer, [name] 08/07/2023 2:00 PM Medical Record Number: [id number] Patient Account Number: [id number] Date of Birth/Sex: Treating RN: Nov 04, 1965 (57 y.o. F) Primary Care Provider: PCP, [name]: Referring Provider: Treating Provider/Extender: Sherryl Manges in Treatment: 5 Active Problems ICD-10 Encounter Code Description Active Date MDM Diagnosis L97.822 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osed10/03/2023 No Yes I87.319 Chronic venous hypertension (idiopathic) with ulcer of unspecified lower 07/02/2023 No Yes extremity I89.0 Lymphedema, not elsewhere classified 07/02/2023 No Yes I50.32 Chronic diastolic (congestive) heart failure 07/02/2023 No Yes E66.01 Morbid (severe) obesity due to excess calories 07/02/2023 No Yes Inactive Problems Resolved Problems ICD-10 Code Description Active Date Resolved Date L97.812 Non-pressure chronic ulcer of other part of right lower leg with fat layer exposed 07/02/2023 07/02/2023 Electronic Signature(s) Signed: 08/07/2023 2:42:57 PM By: Duanne Guess MD FACS Entered By: Duanne Guess on 08/07/2023 11:42:56 [name] ([id number]) [id number] Page 8 of 12 -------------------------------------------------------------------------------- Progress Note Details Patient Name: Date of Service: [name], [name] 08/07/2023 2:00 PM Medical Record Number: [id number] Patient Account Number: [id number] Date of Birth/Sex: Treating RN: 1966/09/02 (57 y.o. F) Primary Care Provider: PCP, [name]: Referring Provider: Treating Provider/Extender: Sherryl Manges in Treatment:  5 Subjective Chief Complaint Information obtained from Patient Patient presents for treatment of open ulcers due to lymphedema History of Present Illness (HPI) ADMISSION 07/02/2023 ***ABIs non-compressible bilaterally; triphasic Doppler signals*** This is [name] 57 year old morbidly obese patient who is not [name] diabetic. She does have hypertension and CHF. Although she does not carry this diagnosis in her electronic medical record, she clearly has lymphedema. She has multiple wounds on her bilateral lower extremities, 1 of which has been present for 3 years. She has been treating her wounds with peroxide and bacitracin. Her provider has given her various courses of antibiotics, but the wounds have not closed. She was told not to wear compression stockings due to her history of DVT but this was well over [name] year ago. She has never worn lymphedema pumps. , 07/09/2023: Several of the smaller wounds have closed. Remaining wounds include the left proximal medial ulcer and the left lateral distal ulcer, as well as the paired wounds on her right lateral lower leg. There is slough accumulation at all sites. 07/16/2023: The left distal lateral leg ulcer is [name] little bit smaller and quite [name] bit cleaner. The more proximal medial left lower leg ulcer still has [name] fair amount of nonviable tissue present and this more tender. Her wrap did slide down and it appears that it came to rest right in the middle of this wound causing some pressure on the site. The paired right lateral lower leg ulcers are small and superficial with [name] little bit of slough and eschar present. 07/23/2023: The right lateral lower leg ulcers are nearly closed but remain just barely open underneath [name] layer of eschar. The proximal medial left lower leg ulcer has less nonviable tissue present today and is smaller. The left  distal lateral leg ulcer is unchanged in size, but the surface is clean and the quality of the tissue is improving. 07/30/2023:  Both right leg ulcers are healed. The proximal medial left leg ulcer is smaller and more superficial. The left distal lateral leg ulcer is still unchanged in size. There is more granulation tissue filling in, but the surface is still fairly fibrotic. Edema control is adequate. 08/07/2023: She unfortunately had to travel out of town urgently to go to [place] this past week and as [name] result, was on her feet and with her legs in [name] dependent position quite [name] bit. This has resulted in the medial leg ulcer getting [name] little bit larger and the lateral leg ulcer getting [name] little bit deeper. Her juxta lite stockings were delivered and she has them with her today. She has been approved for Epifix so we will proceed with application today. Patient History Information obtained from Patient. Family History Unknown History. Social History Never smoker, Marital Status - Divorced, Alcohol Use - Rarely, Drug Use - No History, Caffeine Use - Daily - Tea,Coffee ,Soda. Medical History Hematologic/Lymphatic Patient has history of Anemia Respiratory Patient has history of Asthma Cardiovascular Patient has history of Congestive Heart Failure, Deep Vein Thrombosis, Hypertension Musculoskeletal Patient has history of Osteoarthritis Hospitalization/Surgery History - Tubal Ligation. Medical [name] Surgical History Notes nd Cardiovascular Morbidly Obese Objective Constitutional JONINA, TREGLIA [name] ([id number]) [id number] Page 9 of 12 Hypertensive, asymptomatic. Tachycardic, asymptomatic. no acute distress. Vitals Time Taken: 2:13 PM, Height: 71 in, Weight: 352 lbs, BMI: 49.1, Temperature: 98.3 F, Pulse: 116 bpm, Respiratory Rate: 20 breaths/min, Blood Pressure: 166/95 mmHg. Respiratory Normal work of breathing on room air.. General Notes: 08/07/2023: She unfortunately had to travel out of town urgently to go to [place] this past week and as [name] result, was on her feet and with her legs in  [name] dependent position quite [name] bit. This has resulted in the medial leg ulcer getting [name] little bit larger and the lateral leg ulcer getting [name] little bit deeper. Integumentary (Hair, Skin) Wound #3 status is Open. Original cause of wound was Gradually Appeared. The date acquired was: 04/01/2023. The wound has been in treatment 5 weeks. The wound is located on the Left,Proximal,Medial Lower Leg. The wound measures 1cm length x 1cm width x 0.1cm depth; 0.785cm^2 area and 0.079cm^3 volume. There is Fat Layer (Subcutaneous Tissue) exposed. There is no tunneling or undermining noted. There is [name] medium amount of serosanguineous drainage noted. The wound margin is distinct with the outline attached to the wound base. There is large (67-100%) red granulation within the wound bed. There is [name] small (1-33%) amount of necrotic tissue within the wound bed including Adherent Slough. The periwound skin appearance had no abnormalities noted for texture. The periwound skin appearance had no abnormalities noted for moisture. The periwound skin appearance exhibited: Hemosiderin Staining. Periwound temperature was noted as No Abnormality. Wound #6 status is Open. Original cause of wound was Gradually Appeared. The date acquired was: 08/09/2020. The wound has been in treatment 5 weeks. The wound is located on the Left,Lateral Lower Leg. The wound measures 1.7cm length x 1.4cm width x 0.4cm depth; 1.869cm^2 area and 0.748cm^3 volume. There is Fat Layer (Subcutaneous Tissue) exposed. There is no tunneling or undermining noted. There is [name] medium amount of serosanguineous drainage noted. The wound margin is distinct with the outline attached to the wound base. There is medium (34-66%) red, pink granulation within the wound bed. There is [name]  medium (34-66%) amount of necrotic tissue within the wound bed including Adherent Slough. The periwound skin appearance had no abnormalities noted for texture. The periwound skin appearance had no  abnormalities noted for moisture. The periwound skin appearance exhibited: Hemosiderin Staining. Periwound temperature was noted as No Abnormality. Assessment Active Problems ICD-10 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osed Chronic venous hypertension (idiopathic) with ulcer of unspecified lower extremity Lymphedema, not elsewhere classified Chronic diastolic (congestive) heart failure Morbid (severe) obesity due to excess calories Procedures Wound #3 Pre-procedure diagnosis of Wound #3 is [name] Lymphedema located on the Left,Proximal,Medial Lower Leg . There was [name] Excisional Skin/Subcutaneous Tissue Debridement with [name] total area of 0.78 sq cm performed by Duanne Guess, MD. With the following instrument(s): Curette to remove Non-Viable tissue/material. Material removed includes Subcutaneous Tissue and Slough and after achieving pain control using Lidocaine 4% T opical Solution. No specimens were taken. [name] time out was conducted at 14:29, prior to the start of the procedure. [name] Minimum amount of bleeding was controlled with Pressure. The procedure was tolerated well. Post Debridement Measurements: 1cm length x 1cm width x 0.1cm depth; 0.079cm^3 volume. Character of Wound/Ulcer Post Debridement is improved. Post procedure Diagnosis Wound #3: Same as Pre-Procedure Pre-procedure diagnosis of Wound #3 is [name] Lymphedema located on the Left,Proximal,Medial Lower Leg. [name] skin graft procedure using [name] bioengineered skin substitute/cellular or tissue based product was performed by Duanne Guess, MD with the following instrument(s): Forceps and Scissors. Epifix was applied and secured with Steri-Strips. 3 sq cm of product was utilized and 0 sq cm was wasted. Post Application, adaptic was applied. [name] Time Out was conducted at 14:31, prior to the start of the procedure. The procedure was tolerated well with [name] pain level of 0 throughout and [name] pain level of 0 following the  procedure. Post procedure Diagnosis Wound #3: Same as Pre-Procedure . Pre-procedure diagnosis of Wound #3 is [name] Lymphedema located on the Left,Proximal,Medial Lower Leg . There was [name] Three Layer Compression Therapy Procedure by Samuella Bruin, RN. Post procedure Diagnosis Wound #3: Same as Pre-Procedure Wound #6 Pre-procedure diagnosis of Wound #6 is [name] Lymphedema located on the Left,Lateral Lower Leg . There was [name] Excisional Skin/Subcutaneous Tissue Debridement with [name] total area of 1.87 sq cm performed by Duanne Guess, MD. With the following instrument(s): Curette to remove Non-Viable tissue/material. Material removed includes Subcutaneous Tissue and Slough and after achieving pain control using Lidocaine 4% T opical Solution. No specimens were taken. [name] time out was conducted at 14:29, prior to the start of the procedure. [name] Minimum amount of bleeding was controlled with Pressure. The procedure was tolerated well. Post Debridement Measurements: 1.7cm length x 1.4cm width x 0.4cm depth; 0.748cm^3 volume. Character of Wound/Ulcer Post Debridement is improved. Post procedure Diagnosis Wound #6: Same as Pre-Procedure Pre-procedure diagnosis of Wound #6 is [name] Lymphedema located on the Left,Lateral Lower Leg. [name] skin graft procedure using [name] bioengineered skin substitute/cellular or tissue based product was performed by Duanne Guess, MD with the following instrument(s): Forceps and Scissors. Epifix was applied and secured with Steri-Strips. 3 sq cm of product was utilized and 0 sq cm was wasted. Post Application, adaptic was applied. [name] Time Out was conducted at 14:31, prior to the start of the procedure. The procedure was tolerated well with [name] pain level of 0 throughout and [name] pain level of 0 following the procedure. Post procedure Diagnosis Wound #6: Same as Pre-Procedure . CABRIELLE, BILICKI [name] ([id number]) [id number]  Page 10 of 12 Plan Follow-up Appointments: Return  Appointment in 1 week. - Dr. Lady Gary Room 3 Anesthetic: (In clinic) Topical Lidocaine 4% applied to wound bed Cellular or Tissue Based Products: Cellular or Tissue Based Product Type: - Epifix #1 used on lateral lower leg wound and leftover product used for the medial lower leg wound Cellular or Tissue Based Product applied to wound bed, secured with steri-strips, cover with Adaptic or Mepitel. (DO NOT REMOVE). Bathing/ Shower/ Hygiene: May shower with protection but do not get wound dressing(s) wet. Protect dressing(s) with water repellant cover (for example, large plastic bag) or [name] cast cover and may then take shower. - Keep legs dry until next appointment. Use Cast Protectors if so desired. Other Bathing/Shower/Hygiene Orders/Instructions: - May want to sponge bath if legs cannot be kept dry until next appointment. Additional Orders / Instructions: Follow Nutritious Diet - Try and increase Protein intake. The goal is 70g-100g per day. Examples are Chicken,fish.meat,pork, [place] yogurt ,eggs etc. Lymphedema Treatment Plan - Exercise, Compression and Elevation: Exercise daily as tolerated. (Walking, ROM, Calf Pumps and T T oe aps) Compression Wraps as ordered Elevate legs 30 - 60 minutes at or above heart level at least 3 - 4 times daily as able/tolerated Avoid standing for long periods and elevate leg(s) parallel to the floor when sitting The following medication(s) was prescribed: lidocaine topical 4 % cream cream topical was prescribed at facility WOUND #3: - Lower Leg Wound Laterality: Left, Medial, Proximal Cleanser: Soap and Water 1 x Per Week/30 Days Discharge Instructions: At Wound center-May shower and wash wound with dial antibacterial soap and water prior to dressing change. Peri-Wound Care: Triamcinolone 15 (g) 1 x Per Week/30 Days Discharge Instructions: Use triamcinolone 15 (g) as directed Peri-Wound Care: Sween Lotion (Moisturizing lotion) 1 x Per Week/30 Days Discharge  Instructions: Apply moisturizing lotion as directed Topical: Gentamicin 1 x Per Week/30 Days Discharge Instructions: As directed by physician Topical: Mupirocin Ointment 1 x Per Week/30 Days Discharge Instructions: Apply Mupirocin (Bactroban) as instructed Prim Dressing: ADAPTIC TOUCH 3x4.25 (in/in) 1 x Per Week/30 Days ary Discharge Instructions: Apply to wound bed as instructed Secondary Dressing: Woven Gauze Sponge, Non-Sterile 4x4 in 1 x Per Week/30 Days Discharge Instructions: Apply over primary dressing as directed. Com pression Wrap: Unnaboot w/Calamine, 4x10 (in/yd) 1 x Per Week/30 Days Discharge Instructions: Apply Unnaboot as directed. WOUND #6: - Lower Leg Wound Laterality: Left, Lateral Cleanser: Soap and Water 1 x Per Week/30 Days Discharge Instructions: At Wound center-May shower and wash wound with dial antibacterial soap and water prior to dressing change. Peri-Wound Care: Triamcinolone 15 (g) 1 x Per Week/30 Days Discharge Instructions: Use triamcinolone 15 (g) as directed Peri-Wound Care: Sween Lotion (Moisturizing lotion) 1 x Per Week/30 Days Discharge Instructions: Apply moisturizing lotion as directed Topical: Gentamicin 1 x Per Week/30 Days Discharge Instructions: As directed by physician Topical: Mupirocin Ointment 1 x Per Week/30 Days Discharge Instructions: Apply Mupirocin (Bactroban) as instructed Prim Dressing: ADAPTIC TOUCH 3x4.25 (in/in) 1 x Per Week/30 Days ary Discharge Instructions: Apply to wound bed as instructed Prim Dressing: Hydrofera Blue Ready Transfer Foam, 4x5 (in/in) 1 x Per Week/30 Days ary Discharge Instructions: Apply to wound bed as instructed Secondary Dressing: Woven Gauze Sponge, Non-Sterile 4x4 in 1 x Per Week/30 Days Discharge Instructions: Apply over primary dressing as directed. Com pression Wrap: Unnaboot w/Calamine, 4x10 (in/yd) 1 x Per Week/30 Days Discharge Instructions: Apply Unnaboot as directed. Com pression Stockings: Jobst  Farrow Wrap 4000 Compression Amount: 30-40  mmHg (left) Discharge Instructions: Apply Renee Pain daily as instructed. Apply first thing in the morning, remove at night before bed. 08/07/2023: She unfortunately had to travel out of town urgently to go to [place] this past week and as [name] result, was on her feet and with her legs in [name] dependent position quite [name] bit. This has resulted in the medial leg ulcer getting [name] little bit larger and the lateral leg ulcer getting [name] little bit deeper. Her juxta lite stockings were delivered and she has them with her today. She has been approved for Epifix so we will proceed with application today. I used [name] curette to debride slough and subcutaneous tissue from each of her wounds. I applied [name] mixture of topical gentamicin and mupirocin to her wounds. The epi fix was then divided into 2 appropriately sized pieces. It was applied to each of the wounds and moistened with saline. On the deeper wound, I used Hydrofera Blue as [name] backing bolster along with [name] folded 2 x 2 sponge. The skin substitute was secured with Adaptic and Steri-Strips in both locations. Calamine based Unna boot was applied for compression. Her Juxtalite was applied to her right leg. Follow-up in 1 week. Electronic Signature(s) Signed: 08/07/2023 2:53:33 PM By: Duanne Guess MD FACS Previous Signature: 08/07/2023 2:49:12 PM Version By: Duanne Guess MD FACS Entered By: Duanne Guess on 08/07/2023 11:53:33 [name] ([id number]) [id number] Page 11 of 12 -------------------------------------------------------------------------------- HxROS Details Patient Name: Date of Service: [name], [name] 08/07/2023 2:00 PM Medical Record Number: [id number] Patient Account Number: [id number] Date of Birth/Sex: Treating RN: November 03, 1965 (57 y.o. F) Primary Care Provider: PCP, [name]: Referring Provider: Treating Provider/Extender: Sherryl Manges  in Treatment: 5 Information Obtained From Patient Hematologic/Lymphatic Medical History: Positive for: Anemia Respiratory Medical History: Positive for: Asthma Cardiovascular Medical History: Positive for: Congestive Heart Failure; Deep Vein Thrombosis; Hypertension Past Medical History Notes: Morbidly Obese Musculoskeletal Medical History: Positive for: Osteoarthritis Immunizations Pneumococcal Vaccine: Received Pneumococcal Vaccination: No Implantable Devices None Hospitalization / Surgery History Type of Hospitalization/Surgery Tubal Ligation Family and Social History Unknown History: Yes; Never smoker; Marital Status - Divorced; Alcohol Use: Rarely; Drug Use: No History; Caffeine Use: Daily - Tea,Coffee ,Soda; Financial Concerns: No; Food, Clothing or Shelter Needs: No; Support System Lacking: No; Transportation Concerns: No Psychologist, prison and probation services) Signed: 08/07/2023 2:54:12 PM By: Duanne Guess MD FACS Entered By: Duanne Guess on 08/07/2023 11:46:11 -------------------------------------------------------------------------------- SuperBill Details Patient Name: Date of Service: Joan [place], [name] 08/07/2023 Medical Record Number: [id number] Patient Account Number: [id number] Date of Birth/Sex: Treating RN: [birth year] (57 y.o. F) Primary Care Provider: PCP, [name]: Referring Provider: Treating Provider/Extender: Alaiza, Kasch, Lamar Blinks ([id number]) [id number] Page 12 of 12 Weeks in Treatment: 5 Diagnosis Coding ICD-10 Codes Code Description [phone] 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osed I87.319 Chronic venous hypertension (idiopathic) with ulcer of unspecified lower extremity I89.0 Lymphedema, not elsewhere classified I50.32 Chronic diastolic (congestive) heart failure E66.01 Morbid (severe) obesity due to excess calories Facility Procedures : CPT4 Code:  [id number] Description: Q4186 Epifix 2cm x 3cm Modifier: Quantity: 6 : CPT4 Code: [id number] Description: 15271 - SKIN SUB GRAFT TRNK/ARM/LEG ICD-10 Diagnosis Description L97.822 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os Modifier: ed Quantity: 1 Physician Procedures : CPT4 Code Description Modifier [id number] 99214 - WC PHYS LEVEL 4 - EST PT ICD-10 Diagnosis Description Q59.563 Non-pressure chronic ulcer  of other part of left lower leg with fat layer exposed I87.319 Chronic venous hypertension (idiopathic) with ulcer  of unspecified lower extremity I89.0 Lymphedema, not elsewhere classified I50.32 Chronic diastolic (congestive) heart failure Quantity: 1 : [id number] 15271 - WC PHYS SKIN SUB GRAFT TRNK/ARM/LEG ICD-10 Diagnosis Description L97.822 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osed Quantity: 1 Electronic Signature(s) Signed: 08/07/2023 2:49:44 PM By: Duanne Guess MD FACS Entered By: Duanne Guess on 08/07/2023 11:49:44

## 2023-08-07 NOTE — Progress Notes (Signed)
[name] ([id number]) [id number] Page 1 of 9 Visit Report for 08/07/2023 Arrival Information Details Patient Name: Date of Service: [name], [place] [name]. 08/07/2023 2:00 PM Medical Record Number: [id number] Patient Account Number: [id number] Date of Birth/Sex: Treating RN: 1966-03-21 (57 y.o. [name] Primary Care Rody Keadle: PCP, [name]: Referring Zenya Hickam: Treating Beverly Ferner/Extender: Sherryl [name] in Treatment: 5 Visit Information History Since Last Visit Added or deleted any medications: No Patient Arrived: Cane Any new allergies or adverse reactions: No Arrival Time: 14:11 Had [name] fall or experienced change in No Accompanied By: self activities of daily living that may affect Transfer Assistance: None risk of falls: Patient Identification Verified: Yes Signs or symptoms of abuse/neglect since last visito No Secondary Verification Process Completed: Yes Hospitalized since last visit: No Patient Has Alerts: Yes Implantable device outside of the clinic excluding No Patient Alerts: Lasix cellular tissue based products placed in the center ABI R Rocky (07/02/23) since last visit: ABI L Delta (07/02/23) Has Dressing in Place as Prescribed: Yes Has Compression in Place as Prescribed: Yes Pain Present Now: No Electronic Signature(s) Signed: 08/07/2023 3:33:50 PM By: Samuella Bruin Entered By: Samuella Bruin on 08/07/2023 11:11:59 -------------------------------------------------------------------------------- Compression Therapy Details Patient Name: Date of Service: Joan Officer, [place] [name]. 08/07/2023 2:00 PM Medical Record Number: [id number] Patient Account Number: [id number] Date of Birth/Sex: Treating RN: [birth year] (57 y.o. [name] Primary Care Herminia Warren: PCP, [name]: Referring Rayson Rando: Treating [name]/Extender: Sherryl [name] in Treatment: 5 Compression Therapy Performed for Wound  Assessment: Wound #3 Left,Proximal,Medial Lower Leg Performed By: Clinician Samuella Bruin, RN Compression Type: Three Layer Post Procedure Diagnosis Same as Pre-procedure Electronic Signature(s) Signed: 08/07/2023 3:33:50 PM By: Samuella Bruin Entered By: Samuella Bruin on 08/07/2023 11:30:28 [name] ([id number]) [phone].pdf Page 2 of 9 -------------------------------------------------------------------------------- Encounter Discharge Information Details Patient Name: Date of Service: [name], [place] [name]. 08/07/2023 2:00 PM Medical Record Number: [id number] Patient Account Number: [id number] Date of Birth/Sex: Treating RN: [birth year] (57 y.o. [name] Primary Care Keaten Mashek: PCP, [name]: Referring Ladasia Sircy: Treating Shanikka Wonders/Extender: Sherryl [name] in Treatment: 5 Encounter Discharge Information Items Post Procedure Vitals Discharge Condition: Stable Temperature (F): 98.3 Ambulatory Status: Cane Pulse (bpm): 116 Discharge Destination: Home Respiratory Rate (breaths/min): 20 Transportation: Private Auto Blood Pressure (mmHg): 166/95 Accompanied By: self Schedule Follow-up Appointment: Yes Clinical Summary of Care: Patient Declined Electronic Signature(s) Signed: 08/07/2023 3:33:50 PM By: Samuella Bruin Entered By: Samuella Bruin on 08/07/2023 12:17:38 -------------------------------------------------------------------------------- Lower Extremity Assessment Details Patient Name: Date of Service: Joan Officer, [place] [name]. 08/07/2023 2:00 PM Medical Record Number: [id number] Patient Account Number: [id number] Date of Birth/Sex: Treating RN: 03-08-1966 (57 y.o. [name] Primary Care Yanisa Goodgame: PCP, [name]: Referring Makensie Mulhall: Treating [name]/Extender: Sherryl [name] in Treatment: 5 Edema Assessment Assessed: [Left: No] [Right: No] [Left: Edema] [Right:  :] Calf Left: Right: Point of Measurement: 32 cm From Medial Instep 44.5 cm 53.3 cm Ankle Left: Right: Point of Measurement: 10 cm From Medial Instep 31 cm 28 cm Vascular Assessment Pulses: Dorsalis Pedis Palpable: [Left:Yes] Extremity colors, hair growth, and conditions: Extremity Color: [Left:Hyperpigmented] [Right:Hyperpigmented] Hair Growth on Extremity: [Left:No] [Right:No] Temperature of Extremity: [Left:Warm] [Right:Warm] Capillary Refill: [Left:< 3 seconds] [Right:< 3 seconds] Dependent Rubor: [Left:No No] [Right:No No] Electronic Signature(s) Signed: 08/07/2023 3:33:50 PM By: Samuella Bruin Entered By: Samuella Bruin on 08/07/2023 11:19:23 [name] ([id number]) [id number] Page 3 of 9 -------------------------------------------------------------------------------- Multi Wound Chart Details Patient  Name: Date of Service: [name], [place] [name]. 08/07/2023 2:00 PM Medical Record Number: [id number] Patient Account Number: [id number] Date of Birth/Sex: Treating RN: 1966/04/24 (57 y.o. F) Primary Care Lundy Cozart: PCP, [name]: Referring Marycruz Boehner: Treating Olegario Emberson/Extender: Sherryl [name] in Treatment: 5 Vital Signs Height(in): 71 Pulse(bpm): 116 Weight(lbs): 352 Blood Pressure(mmHg): 166/95 Body Mass Index(BMI): 49.1 Temperature(F): 98.3 Respiratory Rate(breaths/min): 20 [3:Photos:] [N/[name]] Left, Proximal, Medial Lower Leg Left, Lateral Lower Leg N/[name] Wound Location: Gradually Appeared Gradually Appeared N/[name] Wounding Event: Lymphedema Lymphedema N/[name] Primary Etiology: Anemia, Asthma, Congestive Heart Anemia, Asthma, Congestive Heart N/[name] Comorbid History: Failure, Deep Vein Thrombosis, Failure, Deep Vein Thrombosis, Hypertension, Osteoarthritis Hypertension, Osteoarthritis 04/01/2023 08/09/2020 N/[name] Date Acquired: 5 5 N/[name] Weeks of Treatment: Open Open N/[name] Wound Status: No No N/[name] Wound Recurrence: 1x1x0.1  1.7x1.4x0.4 N/[name] Measurements L x W x D (cm) 0.785 1.869 N/[name] [name] (cm) : rea 0.079 0.748 N/[name] Volume (cm) : 80.00% 48.30% N/[name] % Reduction in [name] rea: 93.30% 31.00% N/[name] % Reduction in Volume: Full Thickness Without Exposed Full Thickness Without Exposed N/[name] Classification: Support Structures Support Structures Medium Medium N/[name] Exudate [name] mount: Serosanguineous Serosanguineous N/[name] Exudate Type: red, brown red, brown N/[name] Exudate Color: Distinct, outline attached Distinct, outline attached N/[name] Wound Margin: Large (67-100%) Medium (34-66%) N/[name] Granulation [name] mount: Red Red, Pink N/[name] Granulation Quality: Small (1-33%) Medium (34-66%) N/[name] Necrotic [name] mount: Fat Layer (Subcutaneous Tissue): Yes Fat Layer (Subcutaneous Tissue): Yes N/[name] Exposed Structures: Fascia: No Fascia: No Tendon: No Tendon: No Muscle: No Muscle: No Joint: No Joint: No Bone: No Bone: No Medium (34-66%) Small (1-33%) N/[name] Epithelialization: Debridement - Excisional Debridement - Excisional N/[name] Debridement: Pre-procedure Verification/Time Out 14:29 14:29 N/[name] Taken: Lidocaine 4% Topical Solution Lidocaine 4% Topical Solution N/[name] Pain Control: Subcutaneous, Slough Subcutaneous, Slough N/[name] Tissue Debrided: Skin/Subcutaneous Tissue Skin/Subcutaneous Tissue N/[name] Level: 0.78 1.87 N/[name] Debridement [name] (sq cm): rea Curette Curette N/[name] Instrument: Minimum Minimum N/[name] Bleeding: Pressure Pressure N/[name] Hemostasis [name] chieved: Procedure was tolerated well Procedure was tolerated well N/[name] Debridement Treatment Response: 1x1x0.1 1.7x1.4x0.4 N/[name] Post Debridement Measurements L x W x D (cm) 0.079 0.748 N/[name] Post Debridement Volume: (cm) No Abnormalities Noted Scarring: Yes N/[name] Periwound Skin Texture: No Abnormalities Noted No Abnormalities Noted N/[name] Periwound Skin Moisture: ALIYHA, REEVER [name] ([id number]) [phone].pdf Page 4 of 9 Hemosiderin Staining: Yes Hemosiderin Staining: Yes N/[name] Periwound  Skin Color: No Abnormality No Abnormality N/[name] Temperature: Cellular or Tissue Based Product Cellular or Tissue Based Product N/[name] Procedures Performed: Compression Therapy Debridement Debridement Treatment Notes Electronic Signature(s) Signed: 08/07/2023 2:43:03 PM By: Duanne Guess MD FACS Entered By: Duanne Guess on 08/07/2023 11:43:03 -------------------------------------------------------------------------------- Multi-Disciplinary Care Plan Details Patient Name: Date of Service: Joan Officer, [place] [name]. 08/07/2023 2:00 PM Medical Record Number: [id number] Patient Account Number: [id number] Date of Birth/Sex: Treating RN: 1965/10/06 (57 y.o. [name] Primary Care Celina Shiley: PCP, [name]: Referring Emon Miggins: Treating Jowanna Loeffler/Extender: Sherryl [name] in Treatment: 5 Active Inactive Wound/Skin Impairment Nursing Diagnoses: Impaired tissue integrity Goals: Patient/caregiver will verbalize understanding of skin care regimen Date Initiated: 07/02/2023 Target Resolution Date: 09/25/2023 Goal Status: Active Interventions: Assess ulceration(s) every visit Treatment Activities: Skin care regimen initiated : 07/02/2023 Notes: Electronic Signature(s) Signed: 08/07/2023 3:33:50 PM By: Samuella Bruin Entered By: Samuella Bruin on 08/07/2023 11:36:40 -------------------------------------------------------------------------------- Pain Assessment Details Patient Name: Date of Service: Joan Officer, [place] [name]. 08/07/2023 2:00 PM Medical Record Number: [id number] Patient Account Number: [id number] Date of Birth/Sex: Treating RN: [birth year] (57 y.o. F)  Samuella Bruin Primary Care Marcques Wrightsman: PCP, [name]: Referring Christinia Lambeth: Treating [name]/Extender: Sherryl [name] in Treatment: 5 Active Problems Location of Pain Severity and Description of Pain [name], [name] ([id number]) [phone].pdf Page 5 of 9 Patient  Has Paino No Site Locations Rate the pain. Current Pain Level: 0 Pain Management and Medication Current Pain Management: Electronic Signature(s) Signed: 08/07/2023 3:33:50 PM By: Samuella Bruin Entered By: Samuella Bruin on 08/07/2023 11:13:58 -------------------------------------------------------------------------------- Patient/Caregiver Education Details Patient Name: Date of Service: Joan Officer, [place] [name]. 11/12/2024andnbsp2:00 PM Medical Record Number: [id number] Patient Account Number: [id number] Date of Birth/Gender: Treating RN: 27-Mar-1966 (57 y.o. [name] Primary Care Physician: PCP, [name]: Referring Physician: Treating Physician/Extender: Sherryl [name] in Treatment: 5 Education Assessment Education Provided To: Patient Education Topics Provided Wound/Skin Impairment: Methods: Explain/Verbal Responses: Reinforcements needed, State content correctly Electronic Signature(s) Signed: 08/07/2023 3:33:50 PM By: Samuella Bruin Entered By: Samuella Bruin on 08/07/2023 11:36:57 -------------------------------------------------------------------------------- Wound Assessment Details Patient Name: Date of Service: Joan Officer, [place] [name]. 08/07/2023 2:00 PM Medical Record Number: [id number] Patient Account Number: [id number] Date of Birth/Sex: Treating RN: 08-25-1966 (57 y.o. [address] [place], Martin [name] ([id number]) [id number] Page 6 of 9 Primary Care Kenneshia Rehm: PCP, [name]: Referring Terius Jacuinde: Treating [name]/Extender: Sherryl [name] in Treatment: 5 Wound Status Wound Number: 3 Primary Lymphedema Etiology: Wound Location: Left, Proximal, Medial Lower Leg Wound Open Wounding Event: Gradually Appeared Status: Date Acquired: 04/01/2023 Comorbid Anemia, Asthma, Congestive Heart Failure, Deep Vein Weeks Of Treatment: 5 History: Thrombosis, Hypertension, Osteoarthritis Clustered  Wound: No Photos Wound Measurements Length: (cm) 1 Width: (cm) 1 Depth: (cm) 0.1 Area: (cm) 0.785 Volume: (cm) 0.079 % Reduction in Area: 80% % Reduction in Volume: 93.3% Epithelialization: Medium (34-66%) Tunneling: No Undermining: No Wound Description Classification: Full Thickness Without Exposed Support Structures Wound Margin: Distinct, outline attached Exudate Amount: Medium Exudate Type: Serosanguineous Exudate Color: red, brown Foul Odor After Cleansing: No Slough/Fibrino Yes Wound Bed Granulation Amount: Large (67-100%) Exposed Structure Granulation Quality: Red Fascia Exposed: No Necrotic Amount: Small (1-33%) Fat Layer (Subcutaneous Tissue) Exposed: Yes Necrotic Quality: Adherent Slough Tendon Exposed: No Muscle Exposed: No Joint Exposed: No Bone Exposed: No Periwound Skin Texture Texture Color No Abnormalities Noted: Yes No Abnormalities Noted: No Hemosiderin Staining: Yes Moisture No Abnormalities Noted: Yes Temperature / Pain Temperature: No Abnormality Treatment Notes Wound #3 (Lower Leg) Wound Laterality: Left, Medial, Proximal Cleanser Soap and Water Discharge Instruction: At Wound center-May shower and wash wound with dial antibacterial soap and water prior to dressing change. Peri-Wound Care Triamcinolone 15 (g) Discharge Instruction: Use triamcinolone 15 (g) as directed Sween Lotion (Moisturizing lotion) Discharge Instruction: Apply moisturizing lotion as directed Topical Gentamicin [name], [name] ([id number]) [phone].pdf Page 7 of 9 Discharge Instruction: As directed by physician Mupirocin Ointment Discharge Instruction: Apply Mupirocin (Bactroban) as instructed Primary Dressing ADAPTIC TOUCH 3x4.25 (in/in) Discharge Instruction: Apply to wound bed as instructed Secondary Dressing Woven Gauze Sponge, Non-Sterile 4x4 in Discharge Instruction: Apply over primary dressing as directed. Secured With Compression  Wrap Unnaboot w/Calamine, 4x10 (in/yd) Discharge Instruction: Apply Unnaboot as directed. Compression Stockings Add-Ons Electronic Signature(s) Signed: 08/07/2023 3:33:50 PM By: Samuella Bruin Entered By: Samuella Bruin on 08/07/2023 11:23:04 -------------------------------------------------------------------------------- Wound Assessment Details Patient Name: Date of Service: Joan Officer, [place] [name]. 08/07/2023 2:00 PM Medical Record Number: [id number] Patient Account Number: [id number] Date of Birth/Sex: Treating RN: 1966/05/22 (57 y.o. [name] Primary Care [name]: PCP, [name]: Referring George Alcantar: Treating [name]/Extender:  Duanne Guess Weeks in Treatment: 5 Wound Status Wound Number: 6 Primary Lymphedema Etiology: Wound Location: Left, Lateral Lower Leg Wound Open Wounding Event: Gradually Appeared Status: Date Acquired: 08/09/2020 Comorbid Anemia, Asthma, Congestive Heart Failure, Deep Vein Weeks Of Treatment: 5 History: Thrombosis, Hypertension, Osteoarthritis Clustered Wound: No Photos Wound Measurements Length: (cm) 1.7 Width: (cm) 1.4 Depth: (cm) 0.4 Area: (cm) 1.869 Volume: (cm) 0.748 % Reduction in Area: 48.3% % Reduction in Volume: 31% Epithelialization: Small (1-33%) Tunneling: No Undermining: No Wound Description Classification: Full Thickness Without Exposed Support Structures [name], [name] ([id number]) Wound Margin: Distinct, outline attached Exudate Amount: Medium Exudate Type: Serosanguineous Exudate Color: red, brown Foul Odor After Cleansing: No [phone].pdf Page 8 of 9 Slough/Fibrino Yes Wound Bed Granulation Amount: Medium (34-66%) Exposed Structure Granulation Quality: Red, Pink Fascia Exposed: No Necrotic Amount: Medium (34-66%) Fat Layer (Subcutaneous Tissue) Exposed: Yes Necrotic Quality: Adherent Slough Tendon Exposed: No Muscle Exposed: No Joint Exposed: No Bone Exposed:  No Periwound Skin Texture Texture Color No Abnormalities Noted: Yes No Abnormalities Noted: No Hemosiderin Staining: Yes Moisture No Abnormalities Noted: Yes Temperature / Pain Temperature: No Abnormality Treatment Notes Wound #6 (Lower Leg) Wound Laterality: Left, Lateral Cleanser Soap and Water Discharge Instruction: At Wound center-May shower and wash wound with dial antibacterial soap and water prior to dressing change. Peri-Wound Care Triamcinolone 15 (g) Discharge Instruction: Use triamcinolone 15 (g) as directed Sween Lotion (Moisturizing lotion) Discharge Instruction: Apply moisturizing lotion as directed Topical Gentamicin Discharge Instruction: As directed by physician Mupirocin Ointment Discharge Instruction: Apply Mupirocin (Bactroban) as instructed Primary Dressing ADAPTIC TOUCH 3x4.25 (in/in) Discharge Instruction: Apply to wound bed as instructed Hydrofera Blue Ready Transfer Foam, 4x5 (in/in) Discharge Instruction: Apply to wound bed as instructed Secondary Dressing Woven Gauze Sponge, Non-Sterile 4x4 in Discharge Instruction: Apply over primary dressing as directed. Secured With Compression Wrap Unnaboot w/Calamine, 4x10 (in/yd) Discharge Instruction: Apply Unnaboot as directed. Compression Stockings Jobst Farrow Wrap 4000 Quantity: 1 Left Leg Compression Amount: 30-40 mmHg Discharge Instruction: Apply Renee Pain daily as instructed. Apply first thing in the morning, remove at night before bed. Add-Ons Electronic Signature(s) Signed: 08/07/2023 3:33:50 PM By: Samuella Bruin Entered By: Samuella Bruin on 08/07/2023 11:23:41 [name] ([id number]) [id number] Page 9 of 9 -------------------------------------------------------------------------------- Vitals Details Patient Name: Date of Service: [name], [place] [name]. 08/07/2023 2:00 PM Medical Record Number: [id number] Patient Account Number: [id number] Date of  Birth/Sex: Treating RN: 1966-07-01 (57 y.o. [name] Primary Care Maegen Wigle: PCP, [name]: Referring Veatrice Eckstein: Treating Bubber Rothert/Extender: Sherryl [name] in Treatment: 5 Vital Signs Time Taken: 14:13 Temperature (F): 98.3 Height (in): 71 Pulse (bpm): 116 Weight (lbs): 352 Respiratory Rate (breaths/min): 20 Body Mass Index (BMI): 49.1 Blood Pressure (mmHg): 166/95 Reference Range: 80 - 120 mg / dl Electronic Signature(s) Signed: 08/07/2023 3:33:50 PM By: Samuella Bruin Entered By: Samuella Bruin on 08/07/2023 11:13:51

## 2023-08-13 ENCOUNTER — Encounter (HOSPITAL_BASED_OUTPATIENT_CLINIC_OR_DEPARTMENT_OTHER): Payer: Medicare HMO | Admitting: General Surgery

## 2023-08-13 DIAGNOSIS — L97822 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osed: Secondary | ICD-10-CM | POA: Diagnosis not present

## 2023-08-13 DIAGNOSIS — I890 Lymphedema, not elsewhere classified: Secondary | ICD-10-CM | POA: Diagnosis not present

## 2023-08-13 NOTE — Progress Notes (Signed)
NYIESHA, EMMITT Joan ([id number]) [id number] Page 1 of 10 Visit Report for 08/13/2023 Arrival Information Details Patient Name: Date of Service: [name], [place] RY Joan. 08/13/2023 10:30 Joan M Medical Record Number: [id number] Patient Account Number: [id number] Date of Birth/Sex: Treating RN: 04-25-1966 (57 y.o. [name] Primary Care Aadhya Bustamante: PCP, [name]: Referring Nahdia Doucet: Treating Via Rosado/Extender: Sherryl Manges in Treatment: 6 Visit Information History Since Last Visit Added or deleted any medications: No Patient Arrived: Cane Any new allergies or adverse reactions: No Arrival Time: 11:02 Had Joan fall or experienced change in No Accompanied By: self activities of daily living that may affect Transfer Assistance: None risk of falls: Patient Identification Verified: Yes Signs or symptoms of abuse/neglect since last visito No Secondary Verification Process Completed: Yes Hospitalized since last visit: No Patient Has Alerts: Yes Implantable device outside of the clinic excluding No Patient Alerts: Lasix cellular tissue based products placed in the center ABI R [place]oh[place] (07/02/23) since last visit: ABI L [place] (07/02/23) Has Dressing in Place as Prescribed: Yes Has Compression in Place as Prescribed: Yes Pain Present Now: No Electronic Signature(s) Signed: 08/13/2023 4:12:41 PM By: Samuella Bruin Entered By: Samuella Bruin on 08/13/2023 08:02:57 -------------------------------------------------------------------------------- Compression Therapy Details Patient Name: Date of Service: Joan Officer, [name] 08/13/2023 10:30 Joan M Medical Record Number: [id number] Patient Account Number: [id number] Date of Birth/Sex: Treating RN: [birth year] (57 y.o. [name] Primary Care Abhay Godbolt: PCP, [name]: Referring Lynnann Knudsen: Treating Philippe Gang/Extender: Sherryl Manges in Treatment: 6 Compression Therapy Performed for  Wound Assessment: Wound #6 Left,Lateral Lower Leg Performed By: Clinician Samuella Bruin, RN Compression Type: Three Layer Post Procedure Diagnosis Same as Pre-procedure Electronic Signature(s) Signed: 08/13/2023 4:12:41 PM By: Samuella Bruin Entered By: Samuella Bruin on 08/13/2023 08:22:56 [name] ([id number]) [id number] Page 2 of 10 -------------------------------------------------------------------------------- Encounter Discharge Information Details Patient Name: Date of Service: [name], [place] RY Joan. 08/13/2023 10:30 Joan M Medical Record Number: [id number] Patient Account Number: [id number] Date of Birth/Sex: Treating RN: Jan 09, 1966 (57 y.o. [name] Primary Care Donnica Jarnagin: PCP, [name]: Referring Rehman Levinson: Treating Nealy Karapetian/Extender: Sherryl Manges in Treatment: 6 Encounter Discharge Information Items Post Procedure Vitals Discharge Condition: Stable Temperature (F): 98.3 Ambulatory Status: Cane Pulse (bpm): 114 Discharge Destination: Home Respiratory Rate (breaths/min): 20 Transportation: Private Auto Blood Pressure (mmHg): 184/101 Accompanied By: self Schedule Follow-up Appointment: Yes Clinical Summary of Care: Patient Declined Electronic Signature(s) Signed: 08/13/2023 4:12:41 PM By: Samuella Bruin Entered By: Samuella Bruin on 08/13/2023 08:25:31 -------------------------------------------------------------------------------- Lower Extremity Assessment Details Patient Name: Date of Service: Joan Officer, [name] 08/13/2023 10:30 Joan M Medical Record Number: [id number] Patient Account Number: [id number] Date of Birth/Sex: Treating RN: 1966-02-01 (57 y.o. [name] Primary Care Nyaira Hodgens: PCP, [name]: Referring Pellegrino Kennard: Treating Lachelle Rissler/Extender: Sherryl Manges in Treatment: 6 Edema Assessment Assessed: [Left: No] [Right: No] [Left: Edema] [Right:  :] Calf Left: Right: Point of Measurement: 32 cm From Medial Instep 57.5 cm 53.3 cm Ankle Left: Right: Point of Measurement: 10 cm From Medial Instep 31.8 cm 28 cm Vascular Assessment Pulses: Dorsalis Pedis Palpable: [Left:Yes] Extremity colors, hair growth, and conditions: Extremity Color: [Left:Hyperpigmented] [Right:Hyperpigmented] Hair Growth on Extremity: [Left:No] [Right:No] Temperature of Extremity: [Left:Warm] [Right:Warm] Capillary Refill: [Left:< 3 seconds] [Right:< 3 seconds] Dependent Rubor: [Left:No No] [Right:No No] Electronic Signature(s) Signed: 08/13/2023 4:12:41 PM By: Samuella Bruin Entered By: Samuella Bruin on 08/13/2023 08:11:48 [name] ([id number]) [id number] Page 3 of 10 -------------------------------------------------------------------------------- Multi  Wound Chart Details Patient Name: Date of Service: [name], [place] RY Joan. 08/13/2023 10:30 Joan M Medical Record Number: [id number] Patient Account Number: [id number] Date of Birth/Sex: Treating RN: [birth year] (57 y.o. F) Primary Care Francyne Arreaga: PCP, [name]: Referring Piper Hassebrock: Treating Rojean Ige/Extender: Sherryl Manges in Treatment: 6 Vital Signs Height(in): 71 Pulse(bpm): 114 Weight(lbs): 352 Blood Pressure(mmHg): 184/101 Body Mass Index(BMI): 49.1 Temperature(F): 98.3 Respiratory Rate(breaths/min): 20 [3:Photos:] [N/Joan:N/Joan] Left, Proximal, Medial Lower Leg Left, Lateral Lower Leg N/Joan Wound Location: Gradually Appeared Gradually Appeared N/Joan Wounding Event: Lymphedema Lymphedema N/Joan Primary Etiology: Anemia, Asthma, Congestive Heart Anemia, Asthma, Congestive Heart N/Joan Comorbid History: Failure, Deep Vein Thrombosis, Failure, Deep Vein Thrombosis, Hypertension, Osteoarthritis Hypertension, Osteoarthritis 04/01/2023 08/09/2020 N/Joan Date Acquired: 6 6 N/Joan Weeks of Treatment: Open Open N/Joan Wound Status: No No N/Joan Wound  Recurrence: 0.5x0.3x0.1 1.7x1.2x0.3 N/Joan Measurements L x W x D (cm) 0.118 1.602 N/Joan Joan (cm) : rea 0.012 0.481 N/Joan Volume (cm) : 97.00% 55.70% N/Joan % Reduction in Joan rea: 99.00% 55.60% N/Joan % Reduction in Volume: Full Thickness Without Exposed Full Thickness Without Exposed N/Joan Classification: Support Structures Support Structures Medium Medium N/Joan Exudate Joan mount: Serosanguineous Serosanguineous N/Joan Exudate Type: red, brown red, brown N/Joan Exudate Color: Distinct, outline attached Distinct, outline attached N/Joan Wound Margin: Large (67-100%) Large (67-100%) N/Joan Granulation Joan mount: Red Red, Pink N/Joan Granulation Quality: None Present (0%) Small (1-33%) N/Joan Necrotic Joan mount: Fat Layer (Subcutaneous Tissue): Yes Fat Layer (Subcutaneous Tissue): Yes N/Joan Exposed Structures: Fascia: No Fascia: No Tendon: No Tendon: No Muscle: No Muscle: No Joint: No Joint: No Bone: No Bone: No Large (67-100%) Small (1-33%) N/Joan Epithelialization: Debridement - Excisional Debridement - Excisional N/Joan Debridement: Pre-procedure Verification/Time Out 11:19 11:19 N/Joan Taken: Lidocaine 5% topical ointment Lidocaine 5% topical ointment N/Joan Pain Control: Necrotic/Eschar, Subcutaneous, Subcutaneous, Slough N/Joan Tissue Debrided: Slough Skin/Subcutaneous Tissue Skin/Subcutaneous Tissue N/Joan Level: 0.12 1.6 N/Joan Debridement Joan (sq cm): rea Curette Curette N/Joan Instrument: Minimum Minimum N/Joan Bleeding: Pressure Pressure N/Joan Hemostasis Achieved: Debridement Treatment Response: Procedure was tolerated well Procedure was tolerated well N/Joan Post Debridement Measurements L x 0.5x0.3x0.1 1.7x1.2x0.3 N/Joan W x D (cm) 0.012 0.481 N/Joan Post Debridement Volume: (cm) No Abnormalities Noted Scarring: Yes N/Joan Periwound Skin TextureCIA, SHARF Joan ([id number]) [id number] Page 4 of 10 No Abnormalities Noted No Abnormalities Noted N/Joan Periwound Skin Moisture: Hemosiderin Staining:  Yes Hemosiderin Staining: Yes N/Joan Periwound Skin Color: No Abnormality No Abnormality N/Joan Temperature: Cellular or Tissue Based Product Cellular or Tissue Based Product N/Joan Procedures Performed: Debridement Compression Therapy Debridement Treatment Notes Wound #3 (Lower Leg) Wound Laterality: Left, Medial, Proximal Cleanser Soap and Water Discharge Instruction: At Wound center-May shower and wash wound with dial antibacterial soap and water prior to dressing change. Peri-Wound Care Triamcinolone 15 (g) Discharge Instruction: Use triamcinolone 15 (g) as directed Sween Lotion (Moisturizing lotion) Discharge Instruction: Apply moisturizing lotion as directed Topical Gentamicin Discharge Instruction: As directed by physician Mupirocin Ointment Discharge Instruction: Apply Mupirocin (Bactroban) as instructed Primary Dressing ADAPTIC TOUCH 3x4.25 (in/in) Discharge Instruction: Apply to wound bed as instructed Secondary Dressing Woven Gauze Sponge, Non-Sterile 4x4 in Discharge Instruction: Apply over primary dressing as directed. Secured With Compression Wrap Unnaboot w/Calamine, 4x10 (in/yd) Discharge Instruction: Apply Unnaboot as directed. Compression Stockings Add-Ons Wound #6 (Lower Leg) Wound Laterality: Left, Lateral Cleanser Soap and Water Discharge Instruction: At Wound center-May shower and wash wound with dial antibacterial soap and water prior to dressing change. Peri-Wound Care Triamcinolone 15 (g) Discharge Instruction: Use triamcinolone  15 (g) as directed Sween Lotion (Moisturizing lotion) Discharge Instruction: Apply moisturizing lotion as directed Topical Gentamicin Discharge Instruction: As directed by physician Mupirocin Ointment Discharge Instruction: Apply Mupirocin (Bactroban) as instructed Primary Dressing ADAPTIC TOUCH 3x4.25 (in/in) Discharge Instruction: Apply to wound bed as instructed Hydrofera Blue Ready Transfer Foam, 4x5 (in/in) Discharge  Instruction: Apply to wound bed as instructed Secondary Dressing Woven Gauze Sponge, Non-Sterile 4x4 in Discharge Instruction: Apply over primary dressing as directed. [name], [name] ([id number]) [id number] Page 5 of 10 Secured With Compression Wrap Unnaboot w/Calamine, 4x10 (in/yd) Discharge Instruction: Apply Unnaboot as directed. Compression Stockings Jobst Farrow Wrap 4000 Quantity: 1 Left Leg Compression Amount: 30-40 mmHg Discharge Instruction: Apply Renee Pain daily as instructed. Apply first thing in the morning, remove at night before bed. Add-Ons Electronic Signature(s) Signed: 08/13/2023 11:33:48 AM By: Duanne Guess MD FACS Entered By: Duanne Guess on 08/13/2023 08:33:47 -------------------------------------------------------------------------------- Multi-Disciplinary Care Plan Details Patient Name: Date of Service: Joan Officer, [name] 08/13/2023 10:30 Joan M Medical Record Number: [id number] Patient Account Number: [id number] Date of Birth/Sex: Treating RN: 23-Aug-1966 (57 y.o. [name] Primary Care Kamaljit Hizer: PCP, [name]: Referring Tyran Huser: Treating Margart Zemanek/Extender: Sherryl Manges in Treatment: 6 Active Inactive Wound/Skin Impairment Nursing Diagnoses: Impaired tissue integrity Goals: Patient/caregiver will verbalize understanding of skin care regimen Date Initiated: 07/02/2023 Target Resolution Date: 09/25/2023 Goal Status: Active Interventions: Assess ulceration(s) every visit Treatment Activities: Skin care regimen initiated : 07/02/2023 Notes: Electronic Signature(s) Signed: 08/13/2023 4:12:41 PM By: Samuella Bruin Entered By: Samuella Bruin on 08/13/2023 08:22:06 -------------------------------------------------------------------------------- Pain Assessment Details Patient Name: Date of Service: Joan Officer, [name] 08/13/2023 10:30 Joan M Medical Record Number: [id number] Patient  Account Number: [id number] Date of Birth/Sex: Treating RN: 1966/06/15 (57 y.o. [name] Primary Care Erby Sanderson: PCP, [name]: Vedia [name] ([id number]) [id number] Page 6 of 10 Referring Ramin Zoll: Treating Savonna Birchmeier/Extender: Sherryl Manges in Treatment: 6 Active Problems Location of Pain Severity and Description of Pain Patient Has Paino No Site Locations Rate the pain. Current Pain Level: 0 Pain Management and Medication Current Pain Management: Electronic Signature(s) Signed: 08/13/2023 4:12:41 PM By: Samuella Bruin Entered By: Samuella Bruin on 08/13/2023 08:03:05 -------------------------------------------------------------------------------- Patient/Caregiver Education Details Patient Name: Date of Service: Joan Officer, [name] 11/18/2024andnbsp10:30 Joan M Medical Record Number: [id number] Patient Account Number: [id number] Date of Birth/Gender: Treating RN: 01-09-1966 (57 y.o. [name] Primary Care Physician: PCP, [name]: Referring Physician: Treating Physician/Extender: Sherryl Manges in Treatment: 6 Education Assessment Education Provided To: Patient Education Topics Provided Wound/Skin Impairment: Methods: Explain/Verbal Responses: Reinforcements needed, State content correctly Electronic Signature(s) Signed: 08/13/2023 4:12:41 PM By: Samuella Bruin Entered By: Samuella Bruin on 08/13/2023 08:22:26 [name] ([id number]) [id number] Page 7 of 10 -------------------------------------------------------------------------------- Wound Assessment Details Patient Name: Date of Service: [name], [place] RY Joan. 08/13/2023 10:30 Joan M Medical Record Number: [id number] Patient Account Number: [id number] Date of Birth/Sex: Treating RN: [birth year] (57 y.o. [name] Primary Care Aamira Bischoff: PCP, [name]: Referring  Zilda No: Treating Jahdai Padovano/Extender: Sherryl Manges in Treatment: 6 Wound Status Wound Number: 3 Primary Lymphedema Etiology: Wound Location: Left, Proximal, Medial Lower Leg Wound Open Wounding Event: Gradually Appeared Status: Date Acquired: 04/01/2023 Comorbid Anemia, Asthma, Congestive Heart Failure, Deep Vein Weeks Of Treatment: 6 History: Thrombosis, Hypertension, Osteoarthritis Clustered Wound: No Photos Wound Measurements Length: (cm) 0.5 Width: (cm) 0.3 Depth: (cm) 0.1 Area: (cm) 0.118 Volume: (cm) 0.012 % Reduction in Area: 97% %  Reduction in Volume: 99% Epithelialization: Large (67-100%) Tunneling: No Undermining: No Wound Description Classification: Full Thickness Without Exposed Suppor Wound Margin: Distinct, outline attached Exudate Amount: Medium Exudate Type: Serosanguineous Exudate Color: red, brown t Structures Foul Odor After Cleansing: No Slough/Fibrino No Wound Bed Granulation Amount: Large (67-100%) Exposed Structure Granulation Quality: Red Fascia Exposed: No Necrotic Amount: None Present (0%) Fat Layer (Subcutaneous Tissue) Exposed: Yes Tendon Exposed: No Muscle Exposed: No Joint Exposed: No Bone Exposed: No Periwound Skin Texture Texture Color No Abnormalities Noted: Yes No Abnormalities Noted: No Hemosiderin Staining: Yes Moisture No Abnormalities Noted: Yes Temperature / Pain Temperature: No Abnormality Treatment Notes Wound #3 (Lower Leg) Wound Laterality: Left, Medial, Proximal Cleanser Soap and Water [name], [name] ([id number]) [id number] Page 8 of 10 Discharge Instruction: At Wound center-May shower and wash wound with dial antibacterial soap and water prior to dressing change. Peri-Wound Care Triamcinolone 15 (g) Discharge Instruction: Use triamcinolone 15 (g) as directed Sween Lotion (Moisturizing lotion) Discharge Instruction: Apply moisturizing lotion as  directed Topical Gentamicin Discharge Instruction: As directed by physician Mupirocin Ointment Discharge Instruction: Apply Mupirocin (Bactroban) as instructed Primary Dressing ADAPTIC TOUCH 3x4.25 (in/in) Discharge Instruction: Apply to wound bed as instructed Secondary Dressing Woven Gauze Sponge, Non-Sterile 4x4 in Discharge Instruction: Apply over primary dressing as directed. Secured With Compression Wrap Unnaboot w/Calamine, 4x10 (in/yd) Discharge Instruction: Apply Unnaboot as directed. Compression Stockings Add-Ons Electronic Signature(s) Signed: 08/13/2023 4:12:41 PM By: Samuella Bruin Entered By: Samuella Bruin on 08/13/2023 08:14:32 -------------------------------------------------------------------------------- Wound Assessment Details Patient Name: Date of Service: Joan Officer, [name] 08/13/2023 10:30 Joan M Medical Record Number: [id number] Patient Account Number: [id number] Date of Birth/Sex: Treating RN: Jul 20, 1966 (57 y.o. [name] Primary Care Adoni Greenough: PCP, [name]: Referring Hilmer Aliberti: Treating Rodd Heft/Extender: Sherryl Manges in Treatment: 6 Wound Status Wound Number: 6 Primary Lymphedema Etiology: Wound Location: Left, Lateral Lower Leg Wound Open Wounding Event: Gradually Appeared Status: Date Acquired: 08/09/2020 Comorbid Anemia, Asthma, Congestive Heart Failure, Deep Vein Weeks Of Treatment: 6 History: Thrombosis, Hypertension, Osteoarthritis Clustered Wound: No Photos [name], [name] ([id number]) [id number] Page 9 of 10 Wound Measurements Length: (cm) 1.7 Width: (cm) 1.2 Depth: (cm) 0.3 Area: (cm) 1.602 Volume: (cm) 0.481 % Reduction in Area: 55.7% % Reduction in Volume: 55.6% Epithelialization: Small (1-33%) Tunneling: No Undermining: No Wound Description Classification: Full Thickness Without Exposed Support Structures Wound Margin: Distinct, outline attached Exudate  Amount: Medium Exudate Type: Serosanguineous Exudate Color: red, brown Foul Odor After Cleansing: No Slough/Fibrino Yes Wound Bed Granulation Amount: Large (67-100%) Exposed Structure Granulation Quality: Red, Pink Fascia Exposed: No Necrotic Amount: Small (1-33%) Fat Layer (Subcutaneous Tissue) Exposed: Yes Necrotic Quality: Adherent Slough Tendon Exposed: No Muscle Exposed: No Joint Exposed: No Bone Exposed: No Periwound Skin Texture Texture Color No Abnormalities Noted: Yes No Abnormalities Noted: No Hemosiderin Staining: Yes Moisture No Abnormalities Noted: Yes Temperature / Pain Temperature: No Abnormality Treatment Notes Wound #6 (Lower Leg) Wound Laterality: Left, Lateral Cleanser Soap and Water Discharge Instruction: At Wound center-May shower and wash wound with dial antibacterial soap and water prior to dressing change. Peri-Wound Care Triamcinolone 15 (g) Discharge Instruction: Use triamcinolone 15 (g) as directed Sween Lotion (Moisturizing lotion) Discharge Instruction: Apply moisturizing lotion as directed Topical Gentamicin Discharge Instruction: As directed by physician Mupirocin Ointment Discharge Instruction: Apply Mupirocin (Bactroban) as instructed Primary Dressing ADAPTIC TOUCH 3x4.25 (in/in) Discharge Instruction: Apply to wound bed as instructed Hydrofera Blue Ready Transfer Foam, 4x5 (in/in) Discharge Instruction: Apply to wound bed as instructed  Secondary Dressing Woven Gauze Sponge, Non-Sterile 4x4 in Discharge Instruction: Apply over primary dressing as directed. Secured With Compression Wrap Unnaboot w/Calamine, 4x10 (in/yd) Discharge Instruction: Apply Unnaboot as directed. Compression Stockings Jobst Farrow Wrap 4000 Quantity: 1 Left Leg Compression Amount: 30-40 mmHg Discharge Instruction: Apply Renee Pain daily as instructed. Apply first thing in the morning, remove at night before bed. [name], [name] ([id number])  [id number] Page 10 of 10 Add-Ons Electronic Signature(s) Signed: 08/13/2023 4:12:41 PM By: Gelene Mink By: Samuella Bruin on 08/13/2023 08:14:52 -------------------------------------------------------------------------------- Vitals Details Patient Name: Date of Service: Joan Officer, [name] 08/13/2023 10:30 Joan M Medical Record Number: [id number] Patient Account Number: [id number] Date of Birth/Sex: Treating RN: 1966/01/07 (57 y.o. [name] Primary Care [name]: PCP, [name]: Referring [name]: Treating Yazeed Pryer/Extender: Sherryl Manges in Treatment: 6 Vital Signs Time Taken: 11:03 Temperature (F): 98.3 Height (in): 71 Pulse (bpm): 114 Weight (lbs): 352 Respiratory Rate (breaths/min): 20 Body Mass Index (BMI): 49.1 Blood Pressure (mmHg): 184/101 Reference Range: 80 - 120 mg / dl Electronic Signature(s) Signed: 08/13/2023 4:12:41 PM By: Samuella Bruin Entered By: Samuella Bruin on 08/13/2023 08:05:52

## 2023-08-13 NOTE — Progress Notes (Signed)
HAILO, VINEYARD Joan ([id number]) [id number] Page 1 of 12 Visit Report for 08/13/2023 Chief Complaint Document Details Patient Name: Date of Service: [name], [place] RY Joan. 08/13/2023 10:30 Joan M Medical Record Number: [id number] Patient Account Number: [id number] Date of Birth/Sex: Treating RN: November 27, 1965 (57 y.o. F) Primary Care Provider: PCP, [name]: Referring Provider: Treating Provider/Extender: Sherryl Manges in Treatment: 6 Information Obtained from: Patient Chief Complaint Patient presents for treatment of open ulcers due to lymphedema Electronic Signature(s) Signed: 08/13/2023 11:33:54 AM By: Duanne Guess MD FACS Entered By: Duanne Guess on 08/13/2023 08:33:54 -------------------------------------------------------------------------------- Cellular or Tissue Based Product Details Patient Name: Date of Service: Joan Officer, [name] 08/13/2023 10:30 Joan M Medical Record Number: [id number] Patient Account Number: [id number] Date of Birth/Sex: Treating RN: 1966/04/28 (57 y.o. Fredderick Phenix Primary Care Provider: PCP, [name]: Referring Provider: Treating Provider/Extender: Sherryl Manges in Treatment: 6 Cellular or Tissue Based Product Type Wound #6 Left,Lateral Lower Leg Applied to: Performed By: Physician Duanne Guess, MD The following information was scribed by: Samuella Bruin The information was scribed for: Duanne Guess Cellular or Tissue Based Product Type: Epifix Level of Consciousness (Pre-procedure): Awake and Alert Pre-procedure Verification/Time Out Yes - 11:23 Taken: Location: trunk / arms / legs Wound Size (sq cm): 2.04 Product Size (sq cm): 4 Waste Size (sq cm): 0 Amount of Product Applied (sq cm): 4 Instrument Used: Forceps, Scissors Lot #: [id number] Expiration Date: 12/25/2027 Fenestrated: No Reconstituted: Yes Solution Type: normal saline Solution Amount: 4 ml Lot  #: [id number] KS Solution Expiration Date: 01/13/2025 Secured: Yes Secured With: Steri-Strips Dressing Applied: Yes Primary Dressing: adaptic Procedural Pain: 0 Post Procedural Pain: 0 Response to Treatment: Procedure was tolerated well [name], [name] ([id number]) [id number] Page 2 of 12 Level of Consciousness (Post- Awake and Alert procedure): Post Procedure Diagnosis Same as Pre-procedure Electronic Signature(s) Signed: 08/13/2023 12:11:08 PM By: Duanne Guess MD FACS Signed: 08/13/2023 4:12:41 PM By: Gelene Mink By: Samuella Bruin on 08/13/2023 08:24:28 -------------------------------------------------------------------------------- Cellular or Tissue Based Product Details Patient Name: Date of Service: Joan Officer, [name] 08/13/2023 10:30 Joan M Medical Record Number: [id number] Patient Account Number: [id number] Date of Birth/Sex: Treating RN: 1965-12-21 (57 y.o. Fredderick Phenix Primary Care Provider: PCP, [name]: Referring Provider: Treating Provider/Extender: Sherryl Manges in Treatment: 6 Cellular or Tissue Based Product Type Wound #3 Left,Proximal,Medial Lower Leg Applied to: Performed By: Physician Duanne Guess, MD The following information was scribed by: Samuella Bruin The information was scribed for: Duanne Guess Cellular or Tissue Based Product Type: Epifix Level of Consciousness (Pre-procedure): Awake and Alert Pre-procedure Verification/Time Out Yes - 11:23 Taken: Location: trunk / arms / legs Wound Size (sq cm): 0.15 Product Size (sq cm): 2 Waste Size (sq cm): 0 Amount of Product Applied (sq cm): 2 Instrument Used: Forceps, Scissors Lot #: [id number] Expiration Date: 12/25/2027 Fenestrated: No Reconstituted: Yes Solution Type: normal saline Solution Amount: 4 ml Lot #: [id number] KS Solution Expiration Date: 01/13/2025 Secured: Yes Secured With: Steri-Strips Dressing  Applied: Yes Primary Dressing: adaptic Procedural Pain: 0 Post Procedural Pain: 0 Response to Treatment: Procedure was tolerated well Level of Consciousness (Post- Awake and Alert procedure): Post Procedure Diagnosis Same as Pre-procedure Electronic Signature(s) Signed: 08/13/2023 12:11:08 PM By: Duanne Guess MD FACS Signed: 08/13/2023 4:12:41 PM By: Samuella Bruin Entered By: Samuella Bruin on 08/13/2023 08:24:53 [name] ([id number]) [id number] Page 3 of 12 -------------------------------------------------------------------------------- Debridement Details Patient Name: Date of Service: Joan Joan Coup, MA  RY Joan. 08/13/2023 10:30 Joan M Medical Record Number: [id number] Patient Account Number: [id number] Date of Birth/Sex: Treating RN: 05-Feb-1966 (57 y.o. Fredderick Phenix Primary Care Provider: PCP, [name]: Referring Provider: Treating Provider/Extender: Sherryl Manges in Treatment: 6 Debridement Performed for Assessment: Wound #3 Left,Proximal,Medial Lower Leg Performed By: Physician Duanne Guess, MD The following information was scribed by: Samuella Bruin The information was scribed for: Duanne Guess Debridement Type: Debridement Level of Consciousness (Pre-procedure): Awake and Alert Pre-procedure Verification/Time Out Yes - 11:19 Taken: Start Time: 11:19 Pain Control: Lidocaine 5% topical ointment Percent of Wound Bed Debrided: 100% T Area Debrided (cm): otal 0.12 Tissue and other material debrided: Non-Viable, Eschar, Slough, Subcutaneous, Slough Level: Skin/Subcutaneous Tissue Debridement Description: Excisional Instrument: Curette Bleeding: Minimum Hemostasis Achieved: Pressure Response to Treatment: Procedure was tolerated well Level of Consciousness (Post- Awake and Alert procedure): Post Debridement Measurements of Total Wound Length: (cm) 0.5 Width: (cm) 0.3 Depth: (cm) 0.1 Volume:  (cm) 0.012 Character of Wound/Ulcer Post Debridement: Improved Post Procedure Diagnosis Same as Pre-procedure Electronic Signature(s) Signed: 08/13/2023 12:11:08 PM By: Duanne Guess MD FACS Signed: 08/13/2023 4:12:41 PM By: Samuella Bruin Entered By: Samuella Bruin on 08/13/2023 08:20:09 -------------------------------------------------------------------------------- Debridement Details Patient Name: Date of Service: Joan Officer, [name] 08/13/2023 10:30 Joan M Medical Record Number: [id number] Patient Account Number: [id number] Date of Birth/Sex: Treating RN: 1965/10/25 (57 y.o. Fredderick Phenix Primary Care Provider: PCP, [name]: Referring Provider: Treating Provider/Extender: Sherryl Manges in Treatment: 6 Debridement Performed for Assessment: Wound #6 Left,Lateral Lower Leg Performed By: Physician Duanne Guess, MD The following information was scribed by: Samuella Bruin The information was scribed for: Duanne Guess Debridement Type: Debridement Level of Consciousness (Pre-procedure): Awake and Alert Pre-procedure Verification/Time Out Yes - 11:19 Taken: [place] ([id number]) [id number] Page 4 of 12 Start Time: 11:19 Pain Control: Lidocaine 5% topical ointment Percent of Wound Bed Debrided: 100% T Area Debrided (cm): otal 1.6 Tissue and other material debrided: Non-Viable, Slough, Subcutaneous, Slough Level: Skin/Subcutaneous Tissue Debridement Description: Excisional Instrument: Curette Bleeding: Minimum Hemostasis Achieved: Pressure Response to Treatment: Procedure was tolerated well Level of Consciousness (Post- Awake and Alert procedure): Post Debridement Measurements of Total Wound Length: (cm) 1.7 Width: (cm) 1.2 Depth: (cm) 0.3 Volume: (cm) 0.481 Character of Wound/Ulcer Post Debridement: Improved Post Procedure Diagnosis Same as Pre-procedure Electronic Signature(s) Signed:  08/13/2023 12:11:08 PM By: Duanne Guess MD FACS Signed: 08/13/2023 4:12:41 PM By: Samuella Bruin Entered By: Samuella Bruin on 08/13/2023 08:22:45 -------------------------------------------------------------------------------- HPI Details Patient Name: Date of Service: Joan Officer, [name] 08/13/2023 10:30 Joan M Medical Record Number: [id number] Patient Account Number: [id number] Date of Birth/Sex: Treating RN: 01/08/1966 (57 y.o. F) Primary Care Provider: PCP, [name]: Referring Provider: Treating Provider/Extender: Sherryl Manges in Treatment: 6 History of Present Illness HPI Description: ADMISSION 07/02/2023 ***ABIs non-compressible bilaterally; triphasic Doppler signals*** This is Joan 57 year old morbidly obese patient who is not Joan diabetic. She does have hypertension and CHF. Although she does not carry this diagnosis in her electronic medical record, she clearly has lymphedema. She has multiple wounds on her bilateral lower extremities, 1 of which has been present for 3 years. She has been treating her wounds with peroxide and bacitracin. Her provider has given her various courses of antibiotics, but the wounds have not closed. She was told not to wear compression stockings due to her history of DVT but this was well over Joan year ago. She has never worn lymphedema pumps. ,  07/09/2023: Several of the smaller wounds have closed. Remaining wounds include the left proximal medial ulcer and the left lateral distal ulcer, as well as the paired wounds on her right lateral lower leg. There is slough accumulation at all sites. 07/16/2023: The left distal lateral leg ulcer is Joan little bit smaller and quite Joan bit cleaner. The more proximal medial left lower leg ulcer still has Joan fair amount of nonviable tissue present and this more tender. Her wrap did slide down and it appears that it came to rest right in the middle of this wound causing some pressure on the site. The  paired right lateral lower leg ulcers are small and superficial with Joan little bit of slough and eschar present. 07/23/2023: The right lateral lower leg ulcers are nearly closed but remain just barely open underneath Joan layer of eschar. The proximal medial left lower leg ulcer has less nonviable tissue present today and is smaller. The left distal lateral leg ulcer is unchanged in size, but the surface is clean and the quality of the tissue is improving. 07/30/2023: Both right leg ulcers are healed. The proximal medial left leg ulcer is smaller and more superficial. The left distal lateral leg ulcer is still unchanged in size. There is more granulation tissue filling in, but the surface is still fairly fibrotic. Edema control is adequate. 08/07/2023: She unfortunately had to travel out of town urgently to go to [place] this past week and as Joan result, was on her feet and with her legs in Joan dependent position quite Joan bit. This has resulted in the medial leg ulcer getting Joan little bit larger and the lateral leg ulcer getting Joan little bit deeper. Her juxta lite stockings were delivered and she has them with her today. She has been approved for Epifix so we will proceed with application today. 08/13/2023: The medial leg ulcer is quite Joan bit smaller this week and the lateral leg ulcer also measured slightly smaller but the tissue surface is healthier- looking. She has been on her feet again Joan lot this past week so her edema control remains poor. [name], [name] ([id number]) [id number] Page 5 of 12 Electronic Signature(s) Signed: 08/13/2023 11:34:51 AM By: Duanne Guess MD FACS Entered By: Duanne Guess on 08/13/2023 08:34:51 -------------------------------------------------------------------------------- Physical Exam Details Patient Name: Date of Service: Joan Officer, [name] 08/13/2023 10:30 Joan M Medical Record Number: [id number] Patient Account Number: [id number] Date  of Birth/Sex: Treating RN: 04/23/1966 (57 y.o. F) Primary Care Provider: PCP, [name]: Referring Provider: Treating Provider/Extender: Sherryl Manges in Treatment: 6 Constitutional Hypertensive, asymptomatic. Tachycardic, asymptomatic. . . no acute distress. Respiratory Normal work of breathing on room air.. Notes 08/13/2023: The medial leg ulcer is quite Joan bit smaller this week and the lateral leg ulcer also measured slightly smaller but the tissue surface is healthier- looking. She has been on her feet again Joan lot this past week so her edema control remains poor. Electronic Signature(s) Signed: 08/13/2023 11:35:22 AM By: Duanne Guess MD FACS Entered By: Duanne Guess on 08/13/2023 08:35:22 -------------------------------------------------------------------------------- Physician Orders Details Patient Name: Date of Service: Joan Officer, [name] 08/13/2023 10:30 Joan M Medical Record Number: [id number] Patient Account Number: [id number] Date of Birth/Sex: Treating RN: Jan 01, 1966 (57 y.o. Fredderick Phenix Primary Care Provider: PCP, [name]: Referring Provider: Treating Provider/Extender: Sherryl Manges in Treatment: 6 The following information was scribed by: Samuella Bruin The information was scribed for: Duanne Guess Verbal /  Phone Orders: No Diagnosis Coding ICD-10 Coding Code Description [phone] 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osed I87.319 Chronic venous hypertension (idiopathic) with ulcer of unspecified lower extremity I89.0 Lymphedema, not elsewhere classified I50.32 Chronic diastolic (congestive) heart failure E66.01 Morbid (severe) obesity due to excess calories Follow-up Appointments ppointment in 1 week. - Dr. Lady Gary Room 2 Return Joan Anesthetic (In clinic) Topical Lidocaine 5% applied to wound bed Cellular or Tissue Based Products THASHA, HOLLERN Joan ([id number])  [id number] Page 6 of 12 Cellular or Tissue Based Product Type: - Epifix #2 used on lateral lower leg wound and leftover product used for the medial lower leg wound Cellular or Tissue Based Product applied to wound bed, secured with steri-strips, cover with Adaptic or Mepitel. (DO NOT REMOVE). Bathing/ Shower/ Hygiene May shower with protection but do not get wound dressing(s) wet. Protect dressing(s) with water repellant cover (for example, large plastic bag) or Joan cast cover and may then take shower. - Keep legs dry until next appointment. Use Cast Protectors if so desired. Other Bathing/Shower/Hygiene Orders/Instructions: - May want to sponge bath if legs cannot be kept dry until next appointment. Additional Orders / Instructions Follow Nutritious Diet - Try and increase Protein intake. The goal is 70g-100g per day. Examples are chicken, fish, meat, pork, Greek yogurt, eggs etc. Lymphedema Treatment Plan - Exercise, Compression and Elevation Exercise daily as tolerated. (Walking, ROM, Calf Pumps and Toe Taps) Compression Wraps as ordered Elevate legs 30 - 60 minutes at or above heart level at least 3 - 4 times daily as able/tolerated Avoid standing for long periods and elevate leg(s) parallel to the floor when sitting Wound Treatment Wound #3 - Lower Leg Wound Laterality: Left, Medial, Proximal Cleanser: Soap and Water 1 x Per Week/30 Days Discharge Instructions: At Wound center-May shower and wash wound with dial antibacterial soap and water prior to dressing change. Peri-Wound Care: Triamcinolone 15 (g) 1 x Per Week/30 Days Discharge Instructions: Use triamcinolone 15 (g) as directed Peri-Wound Care: Sween Lotion (Moisturizing lotion) 1 x Per Week/30 Days Discharge Instructions: Apply moisturizing lotion as directed Topical: Gentamicin 1 x Per Week/30 Days Discharge Instructions: As directed by physician Topical: Mupirocin Ointment 1 x Per Week/30  Days Discharge Instructions: Apply Mupirocin (Bactroban) as instructed Prim Dressing: ADAPTIC TOUCH 3x4.25 (in/in) 1 x Per Week/30 Days ary Discharge Instructions: Apply to wound bed as instructed Secondary Dressing: Woven Gauze Sponge, Non-Sterile 4x4 in 1 x Per Week/30 Days Discharge Instructions: Apply over primary dressing as directed. Compression Wrap: Unnaboot w/Calamine, 4x10 (in/yd) 1 x Per Week/30 Days Discharge Instructions: Apply Unnaboot as directed. Wound #6 - Lower Leg Wound Laterality: Left, Lateral Cleanser: Soap and Water 1 x Per Week/30 Days Discharge Instructions: At Wound center-May shower and wash wound with dial antibacterial soap and water prior to dressing change. Peri-Wound Care: Triamcinolone 15 (g) 1 x Per Week/30 Days Discharge Instructions: Use triamcinolone 15 (g) as directed Peri-Wound Care: Sween Lotion (Moisturizing lotion) 1 x Per Week/30 Days Discharge Instructions: Apply moisturizing lotion as directed Topical: Gentamicin 1 x Per Week/30 Days Discharge Instructions: As directed by physician Topical: Mupirocin Ointment 1 x Per Week/30 Days Discharge Instructions: Apply Mupirocin (Bactroban) as instructed Prim Dressing: ADAPTIC TOUCH 3x4.25 (in/in) 1 x Per Week/30 Days ary Discharge Instructions: Apply to wound bed as instructed Prim Dressing: Hydrofera Blue Ready Transfer Foam, 4x5 (in/in) 1 x Per Week/30 Days ary Discharge Instructions: Apply to wound bed as instructed Secondary Dressing: Woven Gauze Sponge,  Non-Sterile 4x4 in 1 x Per Week/30 Days Discharge Instructions: Apply over primary dressing as directed. Compression Wrap: Unnaboot w/Calamine, 4x10 (in/yd) 1 x Per Week/30 Days Discharge Instructions: Apply Unnaboot as directed. Compression Stockings: Jobst Farrow Wrap 4000 Left Leg Compression Amount: 30-40 mmHG Discharge Instructions: Apply Renee Pain daily as instructed. Apply first thing in the morning, remove at night before bed. CUBA, [name] ([id number]) [id number] Page 7 of 12 Patient Medications llergies: No Known Allergies Joan Notifications Medication Indication Start End 08/13/2023 lidocaine DOSE topical 5 % ointment - ointment topical Electronic Signature(s) Signed: 08/13/2023 12:11:08 PM By: Duanne Guess MD FACS Entered By: Duanne Guess on 08/13/2023 08:36:06 -------------------------------------------------------------------------------- Problem List Details Patient Name: Date of Service: Joan Officer, [name] 08/13/2023 10:30 Joan M Medical Record Number: [id number] Patient Account Number: [id number] Date of Birth/Sex: Treating RN: 07-26-1966 (57 y.o. F) Primary Care Provider: PCP, [name]: Referring Provider: Treating Provider/Extender: Sherryl Manges in Treatment: 6 Active Problems ICD-10 Encounter Code Description Active Date MDM Diagnosis L97.822 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osed10/03/2023 No Yes I87.319 Chronic venous hypertension (idiopathic) with ulcer of unspecified lower 07/02/2023 No Yes extremity I89.0 Lymphedema, not elsewhere classified 07/02/2023 No Yes I50.32 Chronic diastolic (congestive) heart failure 07/02/2023 No Yes E66.01 Morbid (severe) obesity due to excess calories 07/02/2023 No Yes Inactive Problems Resolved Problems ICD-10 Code Description Active Date Resolved Date L97.812 Non-pressure chronic ulcer of other part of right lower leg with fat layer exposed 07/02/2023 07/02/2023 Electronic Signature(s) Signed: 08/13/2023 11:28:40 AM By: Duanne Guess MD FACS Entered By: Duanne Guess on 08/13/2023 08:28:40 [name] ([id number]) [id number] Page 8 of 12 -------------------------------------------------------------------------------- Progress Note Details Patient Name: Date of Service: [name], [place] RY Joan. 08/13/2023 10:30 Joan M Medical Record Number:  [id number] Patient Account Number: [id number] Date of Birth/Sex: Treating RN: [birth year] (57 y.o. F) Primary Care Provider: PCP, [name]: Referring Provider: Treating Provider/Extender: Sherryl Manges in Treatment: 6 Subjective Chief Complaint Information obtained from Patient Patient presents for treatment of open ulcers due to lymphedema History of Present Illness (HPI) ADMISSION 07/02/2023 ***ABIs non-compressible bilaterally; triphasic Doppler signals*** This is Joan 57 year old morbidly obese patient who is not Joan diabetic. She does have hypertension and CHF. Although she does not carry this diagnosis in her electronic medical record, she clearly has lymphedema. She has multiple wounds on her bilateral lower extremities, 1 of which has been present for 3 years. She has been treating her wounds with peroxide and bacitracin. Her provider has given her various courses of antibiotics, but the wounds have not closed. She was told not to wear compression stockings due to her history of DVT but this was well over Joan year ago. She has never worn lymphedema pumps. , 07/09/2023: Several of the smaller wounds have closed. Remaining wounds include the left proximal medial ulcer and the left lateral distal ulcer, as well as the paired wounds on her right lateral lower leg. There is slough accumulation at all sites. 07/16/2023: The left distal lateral leg ulcer is Joan little bit smaller and quite Joan bit cleaner. The more proximal medial left lower leg ulcer still has Joan fair amount of nonviable tissue present and this more tender. Her wrap did slide down and it appears that it came to rest right in the middle of this wound causing some pressure on the site. The paired right lateral lower leg ulcers are small and superficial with Joan little  bit of slough and eschar present. 07/23/2023: The right lateral lower leg ulcers are nearly closed but remain just barely open underneath Joan layer of eschar.  The proximal medial left lower leg ulcer has less nonviable tissue present today and is smaller. The left distal lateral leg ulcer is unchanged in size, but the surface is clean and the quality of the tissue is improving. 07/30/2023: Both right leg ulcers are healed. The proximal medial left leg ulcer is smaller and more superficial. The left distal lateral leg ulcer is still unchanged in size. There is more granulation tissue filling in, but the surface is still fairly fibrotic. Edema control is adequate. 08/07/2023: She unfortunately had to travel out of town urgently to go to [place] this past week and as Joan result, was on her feet and with her legs in Joan dependent position quite Joan bit. This has resulted in the medial leg ulcer getting Joan little bit larger and the lateral leg ulcer getting Joan little bit deeper. Her juxta lite stockings were delivered and she has them with her today. She has been approved for Epifix so we will proceed with application today. 08/13/2023: The medial leg ulcer is quite Joan bit smaller this week and the lateral leg ulcer also measured slightly smaller but the tissue surface is healthier- looking. She has been on her feet again Joan lot this past week so her edema control remains poor. Patient History Information obtained from Patient. Family History Unknown History. Social History Never smoker, Marital Status - Divorced, Alcohol Use - Rarely, Drug Use - No History, Caffeine Use - Daily - Tea,Coffee ,Soda. Medical History Hematologic/Lymphatic Patient has history of Anemia Respiratory Patient has history of Asthma Cardiovascular Patient has history of Congestive Heart Failure, Deep Vein Thrombosis, Hypertension Musculoskeletal Patient has history of Osteoarthritis Hospitalization/Surgery History - Tubal Ligation. Medical Joan Surgical History Notes nd Cardiovascular Morbidly Obese [name], [name] ([id number]) [id number] Page 9 of  12 Objective Constitutional Hypertensive, asymptomatic. Tachycardic, asymptomatic. no acute distress. Vitals Time Taken: 11:03 AM, Height: 71 in, Weight: 352 lbs, BMI: 49.1, Temperature: 98.3 F, Pulse: 114 bpm, Respiratory Rate: 20 breaths/min, Blood Pressure: 184/101 mmHg. Respiratory Normal work of breathing on room air.. General Notes: 08/13/2023: The medial leg ulcer is quite Joan bit smaller this week and the lateral leg ulcer also measured slightly smaller but the tissue surface is healthier-looking. She has been on her feet again Joan lot this past week so her edema control remains poor. Integumentary (Hair, Skin) Wound #3 status is Open. Original cause of wound was Gradually Appeared. The date acquired was: 04/01/2023. The wound has been in treatment 6 weeks. The wound is located on the Left,Proximal,Medial Lower Leg. The wound measures 0.5cm length x 0.3cm width x 0.1cm depth; 0.118cm^2 area and 0.012cm^3 volume. There is Fat Layer (Subcutaneous Tissue) exposed. There is no tunneling or undermining noted. There is Joan medium amount of serosanguineous drainage noted. The wound margin is distinct with the outline attached to the wound base. There is large (67-100%) red granulation within the wound bed. There is no necrotic tissue within the wound bed. The periwound skin appearance had no abnormalities noted for texture. The periwound skin appearance had no abnormalities noted for moisture. The periwound skin appearance exhibited: Hemosiderin Staining. Periwound temperature was noted as No Abnormality. Wound #6 status is Open. Original cause of wound was Gradually Appeared. The date acquired was: 08/09/2020. The wound has been in treatment 6 weeks. The wound is located on the Left,Lateral  Lower Leg. The wound measures 1.7cm length x 1.2cm width x 0.3cm depth; 1.602cm^2 area and 0.481cm^3 volume. There is Fat Layer (Subcutaneous Tissue) exposed. There is no tunneling or undermining noted. There is Joan  medium amount of serosanguineous drainage noted. The wound margin is distinct with the outline attached to the wound base. There is large (67-100%) red, pink granulation within the wound bed. There is Joan small (1- 33%) amount of necrotic tissue within the wound bed including Adherent Slough. The periwound skin appearance had no abnormalities noted for texture. The periwound skin appearance had no abnormalities noted for moisture. The periwound skin appearance exhibited: Hemosiderin Staining. Periwound temperature was noted as No Abnormality. Assessment Active Problems ICD-10 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osed Chronic venous hypertension (idiopathic) with ulcer of unspecified lower extremity Lymphedema, not elsewhere classified Chronic diastolic (congestive) heart failure Morbid (severe) obesity due to excess calories Procedures Wound #3 Pre-procedure diagnosis of Wound #3 is Joan Lymphedema located on the Left,Proximal,Medial Lower Leg . There was Joan Excisional Skin/Subcutaneous Tissue Debridement with Joan total area of 0.12 sq cm performed by Duanne Guess, MD. With the following instrument(s): Curette to remove Non-Viable tissue/material. Material removed includes Eschar, Subcutaneous Tissue, and Slough after achieving pain control using Lidocaine 5% topical ointment. No specimens were taken. Joan time out was conducted at 11:19, prior to the start of the procedure. Joan Minimum amount of bleeding was controlled with Pressure. The procedure was tolerated well. Post Debridement Measurements: 0.5cm length x 0.3cm width x 0.1cm depth; 0.012cm^3 volume. Character of Wound/Ulcer Post Debridement is improved. Post procedure Diagnosis Wound #3: Same as Pre-Procedure Pre-procedure diagnosis of Wound #3 is Joan Lymphedema located on the Left,Proximal,Medial Lower Leg. Joan skin graft procedure using Joan bioengineered skin substitute/cellular or tissue based product was  performed by Duanne Guess, MD with the following instrument(s): Forceps and Scissors. Epifix was applied and secured with Steri-Strips. 2 sq cm of product was utilized and 0 sq cm was wasted. Post Application, adaptic was applied. Joan Time Out was conducted at 11:23, prior to the start of the procedure. The procedure was tolerated well with Joan pain level of 0 throughout and Joan pain level of 0 following the procedure. Post procedure Diagnosis Wound #3: Same as Pre-Procedure . Wound #6 Pre-procedure diagnosis of Wound #6 is Joan Lymphedema located on the Left,Lateral Lower Leg . There was Joan Excisional Skin/Subcutaneous Tissue Debridement with Joan total area of 1.6 sq cm performed by Duanne Guess, MD. With the following instrument(s): Curette to remove Non-Viable tissue/material. Material removed includes Subcutaneous Tissue and Slough and after achieving pain control using Lidocaine 5% topical ointment. No specimens were taken. Joan time out was conducted at 11:19, prior to the start of the procedure. Joan Minimum amount of bleeding was controlled with Pressure. The procedure was tolerated well. Post Debridement Measurements: 1.7cm length x 1.2cm width x 0.3cm depth; 0.481cm^3 volume. Character of Wound/Ulcer Post Debridement is improved. Post procedure Diagnosis Wound #6: Same as Pre-Procedure Pre-procedure diagnosis of Wound #6 is Joan Lymphedema located on the Left,Lateral Lower Leg. Joan skin graft procedure using Joan bioengineered skin substitute/cellular or tissue based product was performed by Duanne Guess, MD with the following instrument(s): Forceps and Scissors. Epifix was applied and secured with Steri-Strips. 4 sq cm of product was utilized and 0 sq cm was wasted. Post Application, adaptic was applied. Joan Time Out was conducted at 11:23, prior to the start of the procedure. The procedure was  tolerated well with Joan pain level of 0 throughout and Joan pain level of 0 following the procedure. Post procedure  Diagnosis Wound #6: Same as Pre-Procedure . Pre-procedure diagnosis of Wound #6 is Joan Lymphedema located on the Left,Lateral Lower Leg . There was Joan Three Layer Compression Therapy Procedure by [name] ([id number]) [id number] Page 10 of 12 Samuella Bruin, [place]. Post procedure Diagnosis Wound #6: Same as Pre-Procedure Plan Follow-up Appointments: Return Appointment in 1 week. - Dr. Lady Gary Room 2 Anesthetic: (In clinic) Topical Lidocaine 5% applied to wound bed Cellular or Tissue Based Products: Cellular or Tissue Based Product Type: - Epifix #2 used on lateral lower leg wound and leftover product used for the medial lower leg wound Cellular or Tissue Based Product applied to wound bed, secured with steri-strips, cover with Adaptic or Mepitel. (DO NOT REMOVE). Bathing/ Shower/ Hygiene: May shower with protection but do not get wound dressing(s) wet. Protect dressing(s) with water repellant cover (for example, large plastic bag) or Joan cast cover and may then take shower. - Keep legs dry until next appointment. Use Cast Protectors if so desired. Other Bathing/Shower/Hygiene Orders/Instructions: - May want to sponge bath if legs cannot be kept dry until next appointment. Additional Orders / Instructions: Follow Nutritious Diet - Try and increase Protein intake. The goal is 70g-100g per day. Examples are chicken, fish, meat, pork, Greek yogurt, eggs etc. Lymphedema Treatment Plan - Exercise, Compression and Elevation: Exercise daily as tolerated. (Walking, ROM, Calf Pumps and T T oe aps) Compression Wraps as ordered Elevate legs 30 - 60 minutes at or above heart level at least 3 - 4 times daily as able/tolerated Avoid standing for long periods and elevate leg(s) parallel to the floor when sitting The following medication(s) was prescribed: lidocaine topical 5 % ointment ointment topical was prescribed at facility WOUND #3: - Lower Leg Wound Laterality: Left,  Medial, Proximal Cleanser: Soap and Water 1 x Per Week/30 Days Discharge Instructions: At Wound center-May shower and wash wound with dial antibacterial soap and water prior to dressing change. Peri-Wound Care: Triamcinolone 15 (g) 1 x Per Week/30 Days Discharge Instructions: Use triamcinolone 15 (g) as directed Peri-Wound Care: Sween Lotion (Moisturizing lotion) 1 x Per Week/30 Days Discharge Instructions: Apply moisturizing lotion as directed Topical: Gentamicin 1 x Per Week/30 Days Discharge Instructions: As directed by physician Topical: Mupirocin Ointment 1 x Per Week/30 Days Discharge Instructions: Apply Mupirocin (Bactroban) as instructed Prim Dressing: ADAPTIC TOUCH 3x4.25 (in/in) 1 x Per Week/30 Days ary Discharge Instructions: Apply to wound bed as instructed Secondary Dressing: Woven Gauze Sponge, Non-Sterile 4x4 in 1 x Per Week/30 Days Discharge Instructions: Apply over primary dressing as directed. Com pression Wrap: Unnaboot w/Calamine, 4x10 (in/yd) 1 x Per Week/30 Days Discharge Instructions: Apply Unnaboot as directed. WOUND #6: - Lower Leg Wound Laterality: Left, Lateral Cleanser: Soap and Water 1 x Per Week/30 Days Discharge Instructions: At Wound center-May shower and wash wound with dial antibacterial soap and water prior to dressing change. Peri-Wound Care: Triamcinolone 15 (g) 1 x Per Week/30 Days Discharge Instructions: Use triamcinolone 15 (g) as directed Peri-Wound Care: Sween Lotion (Moisturizing lotion) 1 x Per Week/30 Days Discharge Instructions: Apply moisturizing lotion as directed Topical: Gentamicin 1 x Per Week/30 Days Discharge Instructions: As directed by physician Topical: Mupirocin Ointment 1 x Per Week/30 Days Discharge Instructions: Apply Mupirocin (Bactroban) as instructed Prim Dressing: ADAPTIC TOUCH 3x4.25 (in/in) 1 x Per Week/30 Days ary Discharge Instructions: Apply to wound bed as instructed Prim Dressing: Hydrofera  Blue Ready Transfer  Foam, 4x5 (in/in) 1 x Per Week/30 Days ary Discharge Instructions: Apply to wound bed as instructed Secondary Dressing: Woven Gauze Sponge, Non-Sterile 4x4 in 1 x Per Week/30 Days Discharge Instructions: Apply over primary dressing as directed. Com pression Wrap: Unnaboot w/Calamine, 4x10 (in/yd) 1 x Per Week/30 Days Discharge Instructions: Apply Unnaboot as directed. Com pression Stockings: Jobst Farrow Wrap 4000 Compression Amount: 30-40 mmHg (left) Discharge Instructions: Apply Renee Pain daily as instructed. Apply first thing in the morning, remove at night before bed. 08/13/2023: The medial leg ulcer is quite Joan bit smaller this week and the lateral leg ulcer also measured slightly smaller but the tissue surface is healthier- looking. She has been on her feet again Joan lot this past week so her edema control remains poor. I used Joan curette to debride slough and subcutaneous tissue from both of her wounds. T opical gentamicin and mupirocin were mixed and applied to the wound surfaces. I then applied EpiFix #2 to each of the wounds. On the lateral leg, Joan circle of Hydrofera Blue and Joan gauze sponge were used as Joan bolster. The product was secured in situ in both locations with Adaptic and Steri-Strips. Calamine based Unna boot was then applied. She will follow-up in 1 week. Electronic Signature(s) Signed: 08/13/2023 11:36:59 AM By: Duanne Guess MD FACS Entered By: Duanne Guess on 08/13/2023 08:36:58 [name] ([id number]) [id number] Page 11 of 12 -------------------------------------------------------------------------------- HxROS Details Patient Name: Date of Service: [name], [place] RY Joan. 08/13/2023 10:30 Joan M Medical Record Number: [id number] Patient Account Number: [id number] Date of Birth/Sex: Treating RN: 07-08-1966 (57 y.o. F) Primary Care Provider: PCP, [name]: Referring Provider: Treating Provider/Extender: Sherryl Manges in  Treatment: 6 Information Obtained From Patient Hematologic/Lymphatic Medical History: Positive for: Anemia Respiratory Medical History: Positive for: Asthma Cardiovascular Medical History: Positive for: Congestive Heart Failure; Deep Vein Thrombosis; Hypertension Past Medical History Notes: Morbidly Obese Musculoskeletal Medical History: Positive for: Osteoarthritis Immunizations Pneumococcal Vaccine: Received Pneumococcal Vaccination: No Implantable Devices None Hospitalization / Surgery History Type of Hospitalization/Surgery Tubal Ligation Family and Social History Unknown History: Yes; Never smoker; Marital Status - Divorced; Alcohol Use: Rarely; Drug Use: No History; Caffeine Use: Daily - Tea,Coffee ,Soda; Financial Concerns: No; Food, Clothing or Shelter Needs: No; Support System Lacking: No; Transportation Concerns: No Psychologist, prison and probation services) Signed: 08/13/2023 12:11:08 PM By: Duanne Guess MD FACS Entered By: Duanne Guess on 08/13/2023 08:34:56 -------------------------------------------------------------------------------- SuperBill Details Patient Name: Date of Service: Joan Officer, [name] 08/13/2023 Medical Record Number: [id number] Patient Account Number: [id number] Date of Birth/Sex: Treating RN: 04-26-1966 (57 y.o. F) Primary Care Provider: PCP, [name]: Referring Provider: Treating Provider/Extender: Adline, Milillo, Lamar Blinks ([id number]) [id number] Page 12 of 12 Weeks in Treatment: 6 Diagnosis Coding ICD-10 Codes Code Description [phone] 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osed I87.319 Chronic venous hypertension (idiopathic) with ulcer of unspecified lower extremity I89.0 Lymphedema, not elsewhere classified I50.32 Chronic diastolic (congestive) heart failure E66.01 Morbid (severe) obesity due to excess calories Facility Procedures : CPT4 Code:  64332951 Description: Q4186 Epifix 2cm x 3cm Modifier: Quantity: 6 : CPT4 Code: [id number] Description: 15271 - SKIN SUB GRAFT TRNK/ARM/LEG ICD-10 Diagnosis Description L97.822 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os Modifier: ed Quantity: 1 Physician Procedures : CPT4 Code Description Modifier [id number] - WC PHYS LEVEL 4 - EST PT ICD-10 Diagnosis Description X32.355 Non-pressure chronic ulcer  of other part of left lower leg with fat layer exposed I87.319 Chronic venous hypertension (idiopathic) with ulcer  of unspecified lower extremity I89.0 Lymphedema, not elsewhere classified I50.32 Chronic diastolic (congestive) heart failure Quantity: 1 : [id number] 15271 - WC PHYS SKIN SUB GRAFT TRNK/ARM/LEG ICD-10 Diagnosis Description L97.822 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osed Quantity: 1 Electronic Signature(s) Signed: 08/13/2023 11:37:17 AM By: Duanne Guess MD FACS Entered By: Duanne Guess on 08/13/2023 08:37:17

## 2023-08-20 ENCOUNTER — Encounter (HOSPITAL_BASED_OUTPATIENT_CLINIC_OR_DEPARTMENT_OTHER): Payer: Medicare HMO | Admitting: General Surgery

## 2023-08-20 DIAGNOSIS — I890 Lymphedema, not elsewhere classified: Secondary | ICD-10-CM | POA: Diagnosis not present

## 2023-08-20 DIAGNOSIS — L97822 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osed: Secondary | ICD-10-CM | POA: Diagnosis not present

## 2023-08-20 NOTE — Progress Notes (Signed)
AYLEENE, ANDREASEN [name] ([id number]) [id number] Page 1 of 9 Visit Report for 08/20/2023 Arrival Information Details Patient Name: Date of Service: [name], [name]. 08/20/2023 7:45 [name] Patient Account Number: [id number] Date of Birth/Sex: Treating RN: Aug 17, 1966 (57 y.o. F) Primary Care [name]: PCP, [name]: Referring [name]: Treating [name]/Extender: [name] in Treatment: 7 Visit Information History Since Last Visit Added or deleted any medications: No Patient Arrived: Cane Any new allergies or adverse reactions: No Arrival Time: 08:05 Had [name] fall or experienced change in No Accompanied By: self activities of daily living that may affect Transfer Assistance: None risk of falls: Patient Has Alerts: Yes Signs or symptoms of abuse/neglect since last visito No Patient Alerts: Las[facility] Hospitalized since last visit: No ABI R [name] (07/02/23) Implantable device outside of the clinic excluding No ABI L [name] (07/02/23) cellular tissue based products placed in the center since last visit: [name] Present Now: Yes Electronic Signature(s) Signed: 08/20/2023 8:48:57 AM By: [name] Entered By: [name] on 08/20/2023 08:05:46 -------------------------------------------------------------------------------- Compression Therapy Details Patient Name: Date of Service: Joan Officer, [name] 08/20/2023 7:45 [name] Medical Record Number: [id number] Patient Account Number: [id number] Date of Birth/Sex: Treating RN: 1966-03-01 (57 y.o. [name] Primary Care Yitta Gongaware: PCP, [name]: Referring [name]: Treating [name]/Extender: [name] in Treatment: 7 Compression Therapy Performed for Wound Assessment: Wound #6 Left,Lateral Lower Leg Performed By: Clinician Joan Bruin, RN Compression Type: Three Layer Post Procedure Diagnosis Same as Pre-procedure Electronic  Signature(s) Signed: 08/20/2023 3:57:27 PM By: [name] Entered By: [name] on 08/20/2023 08:27:22 Encounter Discharge Information Details -------------------------------------------------------------------------------- [name] ([id number]) [id number] Page 2 of 9 Patient Name: Date of Service: [name], [name]. 08/20/2023 7:45 [name] Medical Record Number: [id number] Patient Account Number: [id number] Date of Birth/Sex: Treating RN: Apr 11, 1966 (57 y.o. [name] Primary Care [name]: PCP, [name]: Referring [name]: Treating [name]/Extender: [name] in Treatment: 7 Encounter Discharge Information Items Post Procedure Vitals Discharge Condition: Stable Temperature (F): 98 Ambulatory Status: Cane Pulse (bpm): 112 Discharge Destination: Home Respiratory Rate (breaths/min): 20 Transportation: Private Auto Blood Pressure (mmHg): 195/103 Accompanied By: self Schedule Follow-up Appointment: Yes Clinical Summary of Care: Patient Declined Electronic Signature(s) Signed: 08/20/2023 3:57:27 PM By: [name] Entered By: [name] on 08/20/2023 09:25:10 -------------------------------------------------------------------------------- Lower Extremity Assessment Details Patient Name: Date of Service: Joan Officer, [name] 08/20/2023 7:45 [name] Medical Record Number: [id number] Patient Account Number: [id number] Date of Birth/Sex: Treating RN: Oct 09, 1965 (57 y.o. [name] Primary Care Akaila Rambo: PCP, [name]: Referring [name]: Treating [name]/Extender: [name] in Treatment: 7 Edema Assessment Assessed: [Left: No] [Right: No] [Left: Edema] [Right: :] Calf Left: Right: Point of Measurement: 32 cm From Medial Instep 45.5 cm 53.3 cm Ankle Left: Right: Point of Measurement: 10 cm From Medial Instep 30.3 cm 28 cm Vascular  Assessment Pulses: Dorsalis Pedis Palpable: [Left:Yes] Extremity colors, hair growth, and conditions: Extremity Color: [Left:Hyperpigmented] [Right:Hyperpigmented] Hair Growth on Extremity: [Left:No] [Right:No] Temperature of Extremity: [Left:Warm] [Right:Warm] Capillary Refill: [Left:< 3 seconds] [Right:< 3 seconds] Dependent Rubor: [Left:No No] [Right:No No] Electronic Signature(s) Signed: 08/20/2023 3:57:27 PM By: [name] Entered By: [name] on 08/20/2023 08:14:08 [name] ([id number]) [id number] Page 3 of 9 -------------------------------------------------------------------------------- Multi Wound Chart Details Patient Name: Date of Service: [name], [name]. 08/20/2023 7:45 [name] Medical Record Number: [id number] Patient Account Number:  [id number] Date of Birth/Sex: Treating RN: 07-Oct-1965 (57 y.o. F) Primary Care [name]: PCP, [name]: Referring [name]: Treating [name]/Extender: [name] in Treatment: 7 Vital Signs Height(in): 71 Pulse(bpm): 112 Weight(lbs): 352 Blood Pressure(mmHg): 195/103 Body Mass Index(BMI): 49.1 Temperature(F): 98.0 Respiratory Rate(breaths/min): 20 [3:Photos:] [N/[name]] Left, Proximal, Medial Lower Leg Left, Lateral Lower Leg N/[name] Wound Location: Gradually Appeared Gradually Appeared N/[name] Wounding Event: Lymphedema Lymphedema N/[name] Primary Etiology: Anemia, Asthma, Congestive Heart Anemia, Asthma, Congestive Heart N/[name] Comorbid History: Failure, Deep Vein Thrombosis, Failure, Deep Vein Thrombosis, Hypertension, Osteoarthritis Hypertension, Osteoarthritis 04/01/2023 08/09/2020 N/[name] Date Acquired: 7 7 N/[name] Weeks of Treatment: Open Open N/[name] Wound Status: No No N/[name] Wound Recurrence: 0.1x0.1x0.1 1.6x1.4x0.3 N/[name] Measurements L x W x D (cm) 0.008 1.759 N/[name] [name] (cm) : rea 0.001 0.528 N/[name] Volume (cm) : 99.80% 51.30% N/[name] % Reduction in [name] rea: 99.90% 51.30% N/[name] %  Reduction in Volume: Full Thickness Without Exposed Full Thickness Without Exposed N/[name] Classification: Support Structures Support Structures None Present Medium N/[name] Exudate [name] mount: N/[name] Serosanguineous N/[name] Exudate Type: N/[name] red, brown N/[name] Exudate Color: Distinct, outline attached Distinct, outline attached N/[name] Wound Margin: None Present (0%) Large (67-100%) N/[name] Granulation [name] mount: N/[name] Red, Pink N/[name] Granulation Quality: Large (67-100%) Small (1-33%) N/[name] Necrotic [name] mount: Eschar Adherent Slough N/[name] Necrotic Tissue: Fascia: No Fat Layer (Subcutaneous Tissue): Yes N/[name] Exposed Structures: Fat Layer (Subcutaneous Tissue): No Fascia: No Tendon: No Tendon: No Muscle: No Muscle: No Joint: No Joint: No Bone: No Bone: No Large (67-100%) Small (1-33%) N/[name] Epithelialization: Debridement - Selective/Open Wound Debridement - Excisional N/[name] Debridement: Pre-procedure Verification/Time Out 08:24 08:24 N/[name] Taken: Lidocaine 4% Topical Solution Lidocaine 4% Topical Solution N/[name] [name] Control: Necrotic/Eschar Subcutaneous, Slough N/[name] Tissue Debrided: Non-Viable Tissue Skin/Subcutaneous Tissue N/[name] Level: 0.01 1.76 N/[name] Debridement [name] (sq cm): rea Curette Curette N/[name] Instrument: Minimum Minimum N/[name] Bleeding: Pressure Pressure N/[name] Hemostasis [name] chieved: Procedure was tolerated well Procedure was tolerated well N/[name] Debridement Treatment Response: 0.1x0.1x0.1 1.6x1.4x0.3 N/[name] Post Debridement Measurements L x W x D (cm) 0.001 0.528 N/[name] Post Debridement Volume: (cm) No Abnormalities Noted Scarring: Yes N/[name] Periwound Skin TextureCHARLANNE, [name] ([id number]) [id number] Page 4 of 9 No Abnormalities Noted No Abnormalities Noted N/[name] Periwound Skin Moisture: Hemosiderin Staining: Yes Hemosiderin Staining: Yes N/[name] Periwound Skin Color: No Abnormality No Abnormality N/[name] Temperature: Debridement Cellular or Tissue Based Product N/[name] Procedures  Performed: Compression Therapy Debridement Treatment Notes Electronic Signature(s) Signed: 08/20/2023 8:34:44 AM By: Duanne Guess MD FACS Entered By: Duanne Guess on 08/20/2023 08:34:44 -------------------------------------------------------------------------------- Multi-Disciplinary Care Plan Details Patient Name: Date of Service: Joan Officer, [name] 08/20/2023 7:45 [name] Medical Record Number: [id number] Patient Account Number: [id number] Date of Birth/Sex: Treating RN: 05/07/1966 (57 y.o. [name] Primary Care Cate Oravec: PCP, [name]: Referring Markayla Reichart: Treating Shahed Yeoman/Extender: [name] in Treatment: 7 Active Inactive Wound/Skin Impairment Nursing Diagnoses: Impaired tissue integrity Goals: Patient/caregiver will verbalize understanding of skin care regimen Date Initiated: 07/02/2023 Target Resolution Date: 09/25/2023 Goal Status: Active Interventions: Assess ulceration(s) every visit Treatment Activities: Skin care regimen initiated : 07/02/2023 Notes: Electronic Signature(s) Signed: 08/20/2023 3:57:27 PM By: [name] Entered By: [name] on 08/20/2023 08:04:57 -------------------------------------------------------------------------------- [name] Assessment Details Patient Name: Date of Service: Joan Officer, [name] 08/20/2023 7:45 [name] Medical Record Number: [id number] Patient Account Number: [id number] Date of Birth/Sex: Treating RN: 1966/08/09 (57 y.o. F) Primary Care Lino Wickliff: PCP, [name]: Referring Lashanda Storlie: Treating Janise Gora/Extender: [name] in Treatment:  7 Active Problems [name], [name] ([id number]) [id number] Page 5 of 9 Location of [name] Severity and Description of [name] Patient Has Paino Yes Site Locations Rate the [name]. Current [name] Level: 2 Worst [name] Level: 10 Least [name] Level: 0 Tolerable [name] Level: 2 Character of [name] Describe the  [name]: Burning [name] Management and Medication Current [name] Management: Electronic Signature(s) Signed: 08/20/2023 8:48:57 AM By: [name] Entered By: [name] on 08/20/2023 08:06:26 -------------------------------------------------------------------------------- Patient/Caregiver Education Details Patient Name: Date of Service: Joan Officer, [name] 11/25/2024andnbsp7:45 [name] Medical Record Number: [id number] Patient Account Number: [id number] Date of Birth/Gender: Treating RN: Apr 13, 1966 (57 y.o. [name] Primary Care Physician: PCP, [name]: Referring Physician: Treating Physician/Extender: [name] in Treatment: 7 Education Assessment Education Provided To: Patient Education Topics Provided Wound/Skin Impairment: Methods: Explain/Verbal Responses: Reinforcements needed, State content correctly Electronic Signature(s) Signed: 08/20/2023 3:57:27 PM By: [name] Entered By: [name] on 08/20/2023 08:05:06 [name] ([id number]) [id number] Page 6 of 9 -------------------------------------------------------------------------------- Wound Assessment Details Patient Name: Date of Service: [name], [name]. 08/20/2023 7:45 [name] Medical Record Number: [id number] Patient Account Number: [id number] Date of Birth/Sex: Treating RN: 02-22-1966 (57 y.o. [name] Primary Care Marguita Venning: PCP, [name]: Referring Teryn Gust: Treating Cliffard Hair/Extender: [name] in Treatment: 7 Wound Status Wound Number: 3 Primary Lymphedema Etiology: Wound Location: Left, Proximal, Medial Lower Leg Wound Open Wounding Event: Gradually Appeared Status: Date Acquired: 04/01/2023 Comorbid Anemia, Asthma, Congestive Heart Failure, Deep Vein Weeks Of Treatment: 7 History: Thrombosis, Hypertension, Osteoarthritis Clustered Wound: No Photos Wound Measurements Length: (cm) 0.1 Width:  (cm) 0.1 Depth: (cm) 0.1 Area: (cm) 0.008 Volume: (cm) 0.001 % Reduction in Area: 99.8% % Reduction in Volume: 99.9% Epithelialization: Large (67-100%) Tunneling: No Undermining: No Wound Description Classification: Full Thickness Without Exposed Support Structures Wound Margin: Distinct, outline attached Exudate Amount: None Present Foul Odor After Cleansing: No Slough/Fibrino No Wound Bed Granulation Amount: None Present (0%) Exposed Structure Necrotic Amount: Large (67-100%) Fascia Exposed: No Necrotic Quality: Eschar Fat Layer (Subcutaneous Tissue) Exposed: No Tendon Exposed: No Muscle Exposed: No Joint Exposed: No Bone Exposed: No Periwound Skin Texture Texture Color No Abnormalities Noted: Yes No Abnormalities Noted: No Hemosiderin Staining: Yes Moisture No Abnormalities Noted: Yes Temperature / [name] Temperature: No Abnormality Treatment Notes Wound #3 (Lower Leg) Wound Laterality: Left, Medial, Proximal Cleanser Soap and Water Discharge Instruction: At Wound center-May shower and wash wound with dial antibacterial soap and water prior to dressing change. Peri-Wound Care Triamcinolone 15 (g) Discharge Instruction: Use triamcinolone 15 (g) as directed Sween Lotion (Moisturizing lotion) Discharge Instruction: Apply moisturizing lotion as directed [name], [name] ([id number]) [id number] Page 7 of 9 Topical Primary Dressing Hydrofera Blue Ready Transfer Foam, 2.5x2.5 (in/in) Discharge Instruction: Apply directly to wound bed as directed Secondary Dressing Woven Gauze Sponge, Non-Sterile 4x4 in Discharge Instruction: Apply over primary dressing as directed. Secured With Compression Wrap Unnaboot w/Calamine, 4x10 (in/yd) Discharge Instruction: Apply Unnaboot as directed. Compression Stockings Add-Ons Electronic Signature(s) Signed: 08/20/2023 3:57:27 PM By: [name] Entered By: [name] on 08/20/2023  08:18:15 -------------------------------------------------------------------------------- Wound Assessment Details Patient Name: Date of Service: Joan Officer, [name] 08/20/2023 7:45 [name] Medical Record Number: [id number] Patient Account Number: [id number] Date of Birth/Sex: Treating RN: 1966-08-10 (57 y.o. [name] Primary Care Ihan Pat: PCP, [name]: Referring Arlette Schaad: Treating Anthonny Schiller/Extender: [name] in Treatment: 7 Wound Status Wound Number: 6 Primary Lymphedema Etiology: Wound Location: Left,  Lateral Lower Leg Wound Open Wounding Event: Gradually Appeared Status: Date Acquired: 08/09/2020 Comorbid Anemia, Asthma, Congestive Heart Failure, Deep Vein Weeks Of Treatment: 7 History: Thrombosis, Hypertension, Osteoarthritis Clustered Wound: No Photos Wound Measurements Length: (cm) 1.6 Width: (cm) 1.4 Depth: (cm) 0.3 Area: (cm) 1.759 Volume: (cm) 0.528 % Reduction in Area: 51.3% % Reduction in Volume: 51.3% Epithelialization: Small (1-33%) Tunneling: No Undermining: No Wound Description Classification: Full Thickness Without Exposed Support Structures Wound Margin: Distinct, outline attached Exudate Amount: Medium [name], [name] ([id number]) Exudate Type: Serosanguineous Exudate Color: red, brown Foul Odor After Cleansing: No Slough/Fibrino Yes [id number] Page 8 of 9 Wound Bed Granulation Amount: Large (67-100%) Exposed Structure Granulation Quality: Red, Pink Fascia Exposed: No Necrotic Amount: Small (1-33%) Fat Layer (Subcutaneous Tissue) Exposed: Yes Necrotic Quality: Adherent Slough Tendon Exposed: No Muscle Exposed: No Joint Exposed: No Bone Exposed: No Periwound Skin Texture Texture Color No Abnormalities Noted: Yes No Abnormalities Noted: No Hemosiderin Staining: Yes Moisture No Abnormalities Noted: Yes Temperature / [name] Temperature: No Abnormality Treatment Notes Wound #6 (Lower  Leg) Wound Laterality: Left, Lateral Cleanser Soap and Water Discharge Instruction: At Wound center-May shower and wash wound with dial antibacterial soap and water prior to dressing change. Peri-Wound Care Triamcinolone 15 (g) Discharge Instruction: Use triamcinolone 15 (g) as directed Sween Lotion (Moisturizing lotion) Discharge Instruction: Apply moisturizing lotion as directed Topical Gentamicin Discharge Instruction: As directed by physician Mupirocin Ointment Discharge Instruction: Apply Mupirocin (Bactroban) as instructed Primary Dressing ADAPTIC TOUCH 3x4.25 (in/in) Discharge Instruction: Apply to wound bed as instructed Hydrofera Blue Ready Transfer Foam, 4x5 (in/in) Discharge Instruction: Apply to wound bed as instructed Secondary Dressing Woven Gauze Sponge, Non-Sterile 4x4 in Discharge Instruction: Apply over primary dressing as directed. Secured With Compression Wrap Unnaboot w/Calamine, 4x10 (in/yd) Discharge Instruction: Apply Unnaboot as directed. Compression Stockings Jobst Farrow Wrap 4000 Quantity: 1 Left Leg Compression Amount: 30-40 mmHg Discharge Instruction: Apply [name] daily as instructed. Apply first thing in the morning, remove at night before bed. Add-Ons Electronic Signature(s) Signed: 08/20/2023 3:57:27 PM By: [name] Entered By: [name] on 08/20/2023 08:18:35 [name] ([id number]) [id number] Page 9 of 9 -------------------------------------------------------------------------------- Vitals Details Patient Name: Date of Service: [name], [name]. 08/20/2023 7:45 [name] Medical Record Number: [id number] Patient Account Number: [id number] Date of Birth/Sex: Treating RN: 1965/12/29 (57 y.o. F) Primary Care Leslyn Monda: PCP, [name]: Referring Vela Render: Treating Aarian Griffie/Extender: [name] in Treatment: 7 Vital Signs Time Taken: 08:05 Temperature (F): 98.0 Height  (in): 71 Pulse (bpm): 112 Weight (lbs): 352 Respiratory Rate (breaths/min): 20 Body Mass Index (BMI): 49.1 Blood Pressure (mmHg): 195/103 Reference Range: 80 - 120 mg / dl Electronic Signature(s) Signed: 08/20/2023 8:48:57 AM By: [name] Entered By: [name] on 08/20/2023 08:06:09

## 2023-08-20 NOTE — Progress Notes (Signed)
AOLANI, WAS [name] ([id number]) [id number] Page 1 of 11 Visit Report for 08/20/2023 Chief Complaint Document Details Patient Name: Date of Service: Joan [name], [place] Joan [name]. 08/20/2023 7:45 [name] Medical Record Number: [id number] Patient Account Number: [id number] Date of Birth/Sex: Treating RN: 12/02/1965 (57 y.o. F) Primary Care Provider: PCP, [name]: Referring Provider: Treating Provider/Extender: Joan [name] in Treatment: 7 Information Obtained from: Patient Chief Complaint Patient presents for treatment of open ulcers due to lymphedema Electronic Signature(s) Signed: 08/20/2023 8:34:51 AM By: Duanne Guess MD FACS Entered By: Duanne Guess on 08/20/2023 08:34:51 -------------------------------------------------------------------------------- Cellular or Tissue Based Product Details Patient Name: Date of Service: Joan Officer, MA Joan [name]. 08/20/2023 7:45 [name] Medical Record Number: [id number] Patient Account Number: [id number] Date of Birth/Sex: Treating RN: [birth year] (57 y.o. Joan [name] Primary Care Provider: PCP, [name]: Referring Provider: Treating Provider/Extender: Joan [name] in Treatment: 7 Cellular or Tissue Based Product Type Wound #6 Left,Lateral Lower Leg Applied to: Performed By: Physician Duanne Guess, MD The following information was scribed by: Samuella Bruin The information was scribed for: Duanne Guess Cellular or Tissue Based Product Type: Epifix Level of Consciousness (Pre-procedure): Awake and Alert Pre-procedure Verification/Time Out Yes - 08:28 Taken: Location: trunk / arms / legs Wound Size (sq cm): 2.24 Product Size (sq cm): 6 Waste Size (sq cm): 0 Amount of Product Applied (sq cm): 6 Instrument Used: Forceps, Scissors Lot #: [id number] Expiration Date: 02/24/2028 Fenestrated: No Reconstituted: Yes Solution Type: normal saline Solution Amount: 5 ml Lot #:  [id number] Solution Expiration Date: 01/13/2025 Secured: Yes Secured With: Steri-Strips Dressing Applied: Yes Primary Dressing: adaptic Procedural Pain: 0 Post Procedural Pain: 0 Response to Treatment: Procedure was tolerated well Joan [name], Joan [name] ([id number]) [id number].pdf Page 2 of 11 Level of Consciousness (Post- Awake and Alert procedure): Post Procedure Diagnosis Same as Pre-procedure Electronic Signature(s) Signed: 08/20/2023 8:41:54 AM By: Duanne Guess MD FACS Signed: 08/20/2023 3:57:27 PM By: Samuella Bruin Entered By: Samuella Bruin on 08/20/2023 08:29:41 -------------------------------------------------------------------------------- Debridement Details Patient Name: Date of Service: Joan Officer, MA Joan [name]. 08/20/2023 7:45 [name] Medical Record Number: [id number] Patient Account Number: [id number] Date of Birth/Sex: Treating RN: October 27, 1965 (57 y.o. Joan [name] Primary Care Provider: PCP, [name]: Referring Provider: Treating Provider/Extender: Joan [name] in Treatment: 7 Debridement Performed for Assessment: Wound #3 Left,Proximal,Medial Lower Leg Performed By: Physician Duanne Guess, MD The following information was scribed by: Samuella Bruin The information was scribed for: Duanne Guess Debridement Type: Debridement Level of Consciousness (Pre-procedure): Awake and Alert Pre-procedure Verification/Time Out Yes - 08:24 Taken: Start Time: 08:24 Pain Control: Lidocaine 4% Topical Solution Percent of Wound Bed Debrided: 100% T Area Debrided (cm): otal 0.01 Tissue and other material debrided: Non-Viable, Eschar Level: Non-Viable Tissue Debridement Description: Selective/Open Wound Instrument: Curette Bleeding: Minimum Hemostasis Achieved: Pressure Response to Treatment: Procedure was tolerated well Level of Consciousness (Post- Awake and Alert procedure): Post Debridement Measurements of Total  Wound Length: (cm) 0.1 Width: (cm) 0.1 Depth: (cm) 0.1 Volume: (cm) 0.001 Character of Wound/Ulcer Post Debridement: Improved Post Procedure Diagnosis Same as Pre-procedure Electronic Signature(s) Signed: 08/20/2023 8:41:54 AM By: Duanne Guess MD FACS Signed: 08/20/2023 3:57:27 PM By: Samuella Bruin Entered By: Samuella Bruin on 08/20/2023 08:24:36 Debridement Details -------------------------------------------------------------------------------- Joan [name] ([id number]) [id number] Page 3 of 11 Patient Name: Date of Service: [name] Joan [name], [place] Joan [name]. 08/20/2023 7:45 [name] Medical Record Number: [id number] Patient Account Number: [id number] Date of Birth/Sex: Treating RN: [birth year] [phone]  y.o. Joan [name] Primary Care Provider: PCP, [name]: Referring Provider: Treating Provider/Extender: Joan [name] in Treatment: 7 Debridement Performed for Assessment: Wound #6 Left,Lateral Lower Leg Performed By: Physician Duanne Guess, MD The following information was scribed by: Samuella Bruin The information was scribed for: Duanne Guess Debridement Type: Debridement Level of Consciousness (Pre-procedure): Awake and Alert Pre-procedure Verification/Time Out Yes - 08:24 Taken: Start Time: 08:24 Pain Control: Lidocaine 4% T opical Solution Percent of Wound Bed Debrided: 100% T Area Debrided (cm): otal 1.76 Tissue and other material debrided: Non-Viable, Slough, Subcutaneous, Slough Level: Skin/Subcutaneous Tissue Debridement Description: Excisional Instrument: Curette Specimen: Tissue Culture Number of Specimens T aken: 1 Bleeding: Minimum Hemostasis Achieved: Pressure Response to Treatment: Procedure was tolerated well Level of Consciousness (Post- Awake and Alert procedure): Post Debridement Measurements of Total Wound Length: (cm) 1.6 Width: (cm) 1.4 Depth: (cm) 0.3 Volume: (cm) 0.528 Character of  Wound/Ulcer Post Debridement: Improved Post Procedure Diagnosis Same as Pre-procedure Electronic Signature(s) Signed: 08/20/2023 8:41:54 AM By: Duanne Guess MD FACS Signed: 08/20/2023 3:57:27 PM By: Samuella Bruin Entered By: Samuella Bruin on 08/20/2023 08:27:11 -------------------------------------------------------------------------------- HPI Details Patient Name: Date of Service: Joan Officer, MA Joan [name]. 08/20/2023 7:45 [name] Medical Record Number: [id number] Patient Account Number: [id number] Date of Birth/Sex: Treating RN: 1965-10-12 (57 y.o. F) Primary Care Provider: PCP, [name]: Referring Provider: Treating Provider/Extender: Joan [name] in Treatment: 7 History of Present Illness HPI Description: ADMISSION 07/02/2023 ***ABIs non-compressible bilaterally; triphasic Doppler signals*** This is [name] 57 year old morbidly obese patient who is not [name] diabetic. She does have hypertension and CHF. Although she does not carry this diagnosis in her electronic medical record, she clearly has lymphedema. She has multiple wounds on her bilateral lower extremities, 1 of which has been present for 3 years. She has been treating her wounds with peroxide and bacitracin. Her provider has given her various courses of antibiotics, but the wounds have not closed. She was told not to wear compression stockings due to her history of DVT but this was well over [name] year ago. She has never worn lymphedema pumps. , 07/09/2023: Several of the smaller wounds have closed. Remaining wounds include the left proximal medial ulcer and the left lateral distal ulcer, as well as the paired wounds on her right lateral lower leg. There is slough accumulation at all sites. Joan [name], Joan [name] ([id number]) [id number] Page 4 of 11 07/16/2023: The left distal lateral leg ulcer is [name] little bit smaller and quite [name] bit cleaner. The more proximal medial left lower leg ulcer still has  [name] fair amount of nonviable tissue present and this more tender. Her wrap did slide down and it appears that it came to rest right in the middle of this wound causing some pressure on the site. The paired right lateral lower leg ulcers are small and superficial with [name] little bit of slough and eschar present. 07/23/2023: The right lateral lower leg ulcers are nearly closed but remain just barely open underneath [name] layer of eschar. The proximal medial left lower leg ulcer has less nonviable tissue present today and is smaller. The left distal lateral leg ulcer is unchanged in size, but the surface is clean and the quality of the tissue is improving. 07/30/2023: Both right leg ulcers are healed. The proximal medial left leg ulcer is smaller and more superficial. The left distal lateral leg ulcer is still unchanged in size. There is more granulation tissue filling in, but the surface is still fairly  fibrotic. Edema control is adequate. 08/07/2023: She unfortunately had to travel out of town urgently to go to [place] this past week and as [name] result, was on her feet and with her legs in [name] dependent position quite [name] bit. This has resulted in the medial leg ulcer getting [name] little bit larger and the lateral leg ulcer getting [name] little bit deeper. Her juxta lite stockings were delivered and she has them with her today. She has been approved for Epifix so we will proceed with application today. 08/13/2023: The medial leg ulcer is quite [name] bit smaller this week and the lateral leg ulcer also measured slightly smaller but the tissue surface is healthier- looking. She has been on her feet again [name] lot this past week so her edema control remains poor. 08/20/2023: The medial leg ulcer is nearly healed with just [name] tiny open area remaining underneath [name] layer of eschar. The lateral leg ulcer is unchanged and the surface remains [name] bit fibrotic. Edema control is better this week. Electronic Signature(s) Signed: 08/20/2023  8:35:30 AM By: Duanne Guess MD FACS Entered By: Duanne Guess on 08/20/2023 08:35:30 -------------------------------------------------------------------------------- Physical Exam Details Patient Name: Date of Service: Joan Officer, MA Joan [name]. 08/20/2023 7:45 [name] Patient Account Number: [id number] Date of Birth/Sex: Treating RN: 1966/03/27 (57 y.o. F) Primary Care Provider: PCP, [name]: Referring Provider: Treating Provider/Extender: Joan [name] in Treatment: 7 Constitutional Hypertensive, asymptomatic. Tachycardic, asymptomatic. . . no acute distress. Respiratory Normal work of breathing on room air.. Notes 08/20/2023: The medial leg ulcer is nearly healed with just [name] tiny open area remaining underneath [name] layer of eschar. The lateral leg ulcer is unchanged and the surface remains [name] bit fibrotic. Edema control is better this week. Electronic Signature(s) Signed: 08/20/2023 8:36:28 AM By: Duanne Guess MD FACS Entered By: Duanne Guess on 08/20/2023 78:29:56 -------------------------------------------------------------------------------- Physician Orders Details Patient Name: Date of Service: Joan Officer, MA Joan [name]. 08/20/2023 7:45 [name] Medical Record Number: [id number] Patient Account Number: [id number] Date of Birth/Sex: Treating RN: [birth year] (57 y.o. Joan [name] Primary Care Provider: PCP, [name]: Referring Provider: Treating Provider/Extender: Joan [name] in Treatment: 7 The following information was scribed by: Samuella Bruin The information was scribed for: Duanne Guess Verbal / Phone Orders: No Diagnosis Coding Joan [name], Joan [name] ([id number]) [id number] Page 5 of 11 ICD-10 Coding Code Description 7205188603 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osed I87.319 Chronic venous hypertension (idiopathic) with ulcer of  unspecified lower extremity I89.0 Lymphedema, not elsewhere classified I50.32 Chronic diastolic (congestive) heart failure E66.01 Morbid (severe) obesity due to excess calories Follow-up Appointments ppointment in 1 week. - Dr. Lady Gary Room 2 Return [name] Anesthetic (In clinic) Topical Lidocaine 4% applied to wound bed Cellular or Tissue Based Products Wound #6 Left,Lateral Lower Leg Cellular or Tissue Based Product Type: - Epifix #3 Cellular or Tissue Based Product applied to wound bed, secured with steri-strips, cover with Adaptic or Mepitel. (DO NOT REMOVE). Bathing/ Shower/ Hygiene May shower with protection but do not get wound dressing(s) wet. Protect dressing(s) with water repellant cover (for example, large plastic bag) or [name] cast cover and may then take shower. - Keep legs dry until next appointment. Use Cast Protectors if so desired. Other Bathing/Shower/Hygiene Orders/Instructions: - May want to sponge bath if legs cannot be kept dry until next appointment. Additional Orders / Instructions Follow Nutritious Diet - Try and increase Protein intake.  The goal is 70g-100g per day. Examples are chicken, fish, meat, pork, Greek yogurt, eggs etc. Lymphedema Treatment Plan - Exercise, Compression and Elevation Exercise daily as tolerated. (Walking, ROM, Calf Pumps and Toe Taps) Compression Wraps as ordered Elevate legs 30 - 60 minutes at or above heart level at least 3 - 4 times daily as able/tolerated Avoid standing for long periods and elevate leg(s) parallel to the floor when sitting Wound Treatment Wound #3 - Lower Leg Wound Laterality: Left, Medial, Proximal Cleanser: Soap and Water 1 x Per Week/30 Days Discharge Instructions: At Wound center-May shower and wash wound with dial antibacterial soap and water prior to dressing change. Peri-Wound Care: Triamcinolone 15 (g) 1 x Per Week/30 Days Discharge Instructions: Use triamcinolone 15 (g) as directed Peri-Wound Care: Sween Lotion  (Moisturizing lotion) 1 x Per Week/30 Days Discharge Instructions: Apply moisturizing lotion as directed Prim Dressing: Hydrofera Blue Ready Transfer Foam, 2.5x2.5 (in/in) 1 x Per Week/30 Days ary Discharge Instructions: Apply directly to wound bed as directed Secondary Dressing: Woven Gauze Sponge, Non-Sterile 4x4 in 1 x Per Week/30 Days Discharge Instructions: Apply over primary dressing as directed. Compression Wrap: Unnaboot w/Calamine, 4x10 (in/yd) 1 x Per Week/30 Days Discharge Instructions: Apply Unnaboot as directed. Wound #6 - Lower Leg Wound Laterality: Left, Lateral Cleanser: Soap and Water 1 x Per Week/30 Days Discharge Instructions: At Wound center-May shower and wash wound with dial antibacterial soap and water prior to dressing change. Peri-Wound Care: Triamcinolone 15 (g) 1 x Per Week/30 Days Discharge Instructions: Use triamcinolone 15 (g) as directed Peri-Wound Care: Sween Lotion (Moisturizing lotion) 1 x Per Week/30 Days Discharge Instructions: Apply moisturizing lotion as directed Topical: Gentamicin 1 x Per Week/30 Days Discharge Instructions: As directed by physician Topical: Mupirocin Ointment 1 x Per Week/30 Days Discharge Instructions: Apply Mupirocin (Bactroban) as instructed Prim Dressing: ADAPTIC TOUCH 3x4.25 (in/in) 1 x Per Week/30 Days ary Discharge Instructions: Apply to wound bed as instructed Prim Dressing: Hydrofera Blue Ready Transfer Foam, 4x5 (in/in) 1 x Per Week/30 Days ary Discharge Instructions: Apply to wound bed as instructed Joan [name], Joan [name] ([id number]) [id number] Page 6 of 11 Secondary Dressing: Woven Gauze Sponge, Non-Sterile 4x4 in 1 x Per Week/30 Days Discharge Instructions: Apply over primary dressing as directed. Compression Wrap: Unnaboot w/Calamine, 4x10 (in/yd) 1 x Per Week/30 Days Discharge Instructions: Apply Unnaboot as directed. Compression Stockings: Jobst Farrow Wrap 4000 Left Leg Compression Amount:  30-40 mmHG Discharge Instructions: Apply Renee Pain daily as instructed. Apply first thing in the morning, remove at night before bed. Laboratory naerobe culture (MICRO) - PCR of nonhealing wound to left lateral lower leg Bacteria identified in Unspecified specimen by [name] LOINC Code: 635-3 Convenience Name: Anaerobic culture Patient Medications llergies: No Known Allergies [name] Notifications Medication Indication Start End 08/20/2023 lidocaine DOSE topical 4 % cream - cream topical Electronic Signature(s) Signed: 08/20/2023 8:41:54 AM By: Duanne Guess MD FACS Entered By: Duanne Guess on 08/20/2023 08:36:56 -------------------------------------------------------------------------------- Problem List Details Patient Name: Date of Service: Joan Officer, MA Joan [name]. 08/20/2023 7:45 [name] Medical Record Number: [id number] Patient Account Number: [id number] Date of Birth/Sex: Treating RN: [birth year] (57 y.o. F) Primary Care Provider: PCP, [name]: Referring Provider: Treating Provider/Extender: Joan [name] in Treatment: 7 Active Problems ICD-10 Encounter Code Description Active Date MDM Diagnosis L97.822 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osed10/03/2023 No Yes I87.319 Chronic venous hypertension (idiopathic) with ulcer of unspecified lower 07/02/2023 No Yes extremity I89.0 Lymphedema, not  elsewhere classified 07/02/2023 No Yes I50.32 Chronic diastolic (congestive) heart failure 07/02/2023 No Yes E66.01 Morbid (severe) obesity due to excess calories 07/02/2023 No Yes Joan [name], Joan [name] ([id number]) [id number] Page 7 of 11 Inactive Problems Resolved Problems ICD-10 Code Description Active Date Resolved Date L97.812 Non-pressure chronic ulcer of other part of right lower leg with fat layer exposed 07/02/2023 07/02/2023 Electronic Signature(s) Signed: 08/20/2023 8:34:36 AM By: Duanne Guess MD  FACS Entered By: Duanne Guess on 08/20/2023 08:34:36 -------------------------------------------------------------------------------- Progress Note Details Patient Name: Date of Service: Joan Officer, MA Joan [name]. 08/20/2023 7:45 [name] Medical Record Number: [id number] Patient Account Number: [id number] Date of Birth/Sex: Treating RN: Nov 15, 1965 (57 y.o. F) Primary Care Provider: PCP, [name]: Referring Provider: Treating Provider/Extender: Joan [name] in Treatment: 7 Subjective Chief Complaint Information obtained from Patient Patient presents for treatment of open ulcers due to lymphedema History of Present Illness (HPI) ADMISSION 07/02/2023 ***ABIs non-compressible bilaterally; triphasic Doppler signals*** This is [name] 57 year old morbidly obese patient who is not [name] diabetic. She does have hypertension and CHF. Although she does not carry this diagnosis in her electronic medical record, she clearly has lymphedema. She has multiple wounds on her bilateral lower extremities, 1 of which has been present for 3 years. She has been treating her wounds with peroxide and bacitracin. Her provider has given her various courses of antibiotics, but the wounds have not closed. She was told not to wear compression stockings due to her history of DVT but this was well over [name] year ago. She has never worn lymphedema pumps. , 07/09/2023: Several of the smaller wounds have closed. Remaining wounds include the left proximal medial ulcer and the left lateral distal ulcer, as well as the paired wounds on her right lateral lower leg. There is slough accumulation at all sites. 07/16/2023: The left distal lateral leg ulcer is [name] little bit smaller and quite [name] bit cleaner. The more proximal medial left lower leg ulcer still has [name] fair amount of nonviable tissue present and this more tender. Her wrap did slide down and it appears that it came to rest right in the middle of this wound causing  some pressure on the site. The paired right lateral lower leg ulcers are small and superficial with [name] little bit of slough and eschar present. 07/23/2023: The right lateral lower leg ulcers are nearly closed but remain just barely open underneath [name] layer of eschar. The proximal medial left lower leg ulcer has less nonviable tissue present today and is smaller. The left distal lateral leg ulcer is unchanged in size, but the surface is clean and the quality of the tissue is improving. 07/30/2023: Both right leg ulcers are healed. The proximal medial left leg ulcer is smaller and more superficial. The left distal lateral leg ulcer is still unchanged in size. There is more granulation tissue filling in, but the surface is still fairly fibrotic. Edema control is adequate. 08/07/2023: She unfortunately had to travel out of town urgently to go to [place] this past week and as [name] result, was on her feet and with her legs in [name] dependent position quite [name] bit. This has resulted in the medial leg ulcer getting [name] little bit larger and the lateral leg ulcer getting [name] little bit deeper. Her juxta lite stockings were delivered and she has them with her today. She has been approved for Epifix so we will proceed with application today. 08/13/2023: The medial leg ulcer is quite [name] bit smaller this week  and the lateral leg ulcer also measured slightly smaller but the tissue surface is healthier- looking. She has been on her feet again [name] lot this past week so her edema control remains poor. 08/20/2023: The medial leg ulcer is nearly healed with just [name] tiny open area remaining underneath [name] layer of eschar. The lateral leg ulcer is unchanged and the surface remains [name] bit fibrotic. Edema control is better this week. Patient History Information obtained from Patient. Family History Unknown History. Social History Never smoker, Marital Status - Divorced, Alcohol Use - Rarely, Drug Use - No History, Caffeine Use - Daily -  Tea,Coffee ,Soda. Joan [name], Joan [name] ([id number]) [id number] Page 8 of 11 Medical History Hematologic/Lymphatic Patient has history of Anemia Respiratory Patient has history of Asthma Cardiovascular Patient has history of Congestive Heart Failure, Deep Vein Thrombosis, Hypertension Musculoskeletal Patient has history of Osteoarthritis Hospitalization/Surgery History - Tubal Ligation. Medical [name] Surgical History Notes nd Cardiovascular Morbidly Obese Objective Constitutional Hypertensive, asymptomatic. Tachycardic, asymptomatic. no acute distress. Vitals Time Taken: 8:05 AM, Height: 71 in, Weight: 352 lbs, BMI: 49.1, Temperature: 98.0 F, Pulse: 112 bpm, Respiratory Rate: 20 breaths/min, Blood Pressure: 195/103 mmHg. Respiratory Normal work of breathing on room air.. General Notes: 08/20/2023: The medial leg ulcer is nearly healed with just [name] tiny open area remaining underneath [name] layer of eschar. The lateral leg ulcer is unchanged and the surface remains [name] bit fibrotic. Edema control is better this week. Integumentary (Hair, Skin) Wound #3 status is Open. Original cause of wound was Gradually Appeared. The date acquired was: 04/01/2023. The wound has been in treatment 7 weeks. The wound is located on the Left,Proximal,Medial Lower Leg. The wound measures 0.1cm length x 0.1cm width x 0.1cm depth; 0.008cm^2 area and 0.001cm^3 volume. There is no tunneling or undermining noted. There is [name] none present amount of drainage noted. The wound margin is distinct with the outline attached to the wound base. There is no granulation within the wound bed. There is [name] large (67-100%) amount of necrotic tissue within the wound bed including Eschar. The periwound skin appearance had no abnormalities noted for texture. The periwound skin appearance had no abnormalities noted for moisture. The periwound skin appearance exhibited: Hemosiderin Staining. Periwound temperature was noted as  No Abnormality. Wound #6 status is Open. Original cause of wound was Gradually Appeared. The date acquired was: 08/09/2020. The wound has been in treatment 7 weeks. The wound is located on the Left,Lateral Lower Leg. The wound measures 1.6cm length x 1.4cm width x 0.3cm depth; 1.759cm^2 area and 0.528cm^3 volume. There is Fat Layer (Subcutaneous Tissue) exposed. There is no tunneling or undermining noted. There is [name] medium amount of serosanguineous drainage noted. The wound margin is distinct with the outline attached to the wound base. There is large (67-100%) red, pink granulation within the wound bed. There is [name] small (1- 33%) amount of necrotic tissue within the wound bed including Adherent Slough. The periwound skin appearance had no abnormalities noted for texture. The periwound skin appearance had no abnormalities noted for moisture. The periwound skin appearance exhibited: Hemosiderin Staining. Periwound temperature was noted as No Abnormality. Assessment Active Problems ICD-10 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osed Chronic venous hypertension (idiopathic) with ulcer of unspecified lower extremity Lymphedema, not elsewhere classified Chronic diastolic (congestive) heart failure Morbid (severe) obesity due to excess calories Procedures Wound #3 Pre-procedure diagnosis of Wound #3 is [name] Lymphedema located on the Left,Proximal,Medial Lower Leg . There was  [name] Selective/Open Wound Non-Viable Tissue Debridement with [name] total area of 0.01 sq cm performed by Duanne Guess, MD. With the following instrument(s): Curette to remove Non-Viable tissue/material. Material removed includes Eschar after achieving pain control using Lidocaine 4% Topical Solution. No specimens were taken. [name] time out was conducted at 08:24, prior to the start of the procedure. [name] Minimum amount of bleeding was controlled with Pressure. The procedure was tolerated well.  Post Debridement Measurements: 0.1cm length x 0.1cm width x 0.1cm depth; 0.001cm^3 volume. Character of Wound/Ulcer Post Debridement is improved. Post procedure Diagnosis Wound #3: Same as Pre-Procedure Joan [name], Joan [name] ([id number]) [id number] Page 9 of 11 Wound #6 Pre-procedure diagnosis of Wound #6 is [name] Lymphedema located on the Left,Lateral Lower Leg . There was [name] Excisional Skin/Subcutaneous Tissue Debridement with [name] total area of 1.76 sq cm performed by Duanne Guess, MD. With the following instrument(s): Curette to remove Non-Viable tissue/material. Material removed includes Subcutaneous Tissue and Slough and after achieving pain control using Lidocaine 4% T opical Solution. 1 specimen was taken by [name] Tissue Culture and sent to the lab per facility protocol. [name] time out was conducted at 08:24, prior to the start of the procedure. [name] Minimum amount of bleeding was controlled with Pressure. The procedure was tolerated well. Post Debridement Measurements: 1.6cm length x 1.4cm width x 0.3cm depth; 0.528cm^3 volume. Character of Wound/Ulcer Post Debridement is improved. Post procedure Diagnosis Wound #6: Same as Pre-Procedure Pre-procedure diagnosis of Wound #6 is [name] Lymphedema located on the Left,Lateral Lower Leg. [name] skin graft procedure using [name] bioengineered skin substitute/cellular or tissue based product was performed by Duanne Guess, MD with the following instrument(s): Forceps and Scissors. Epifix was applied and secured with Steri-Strips. 6 sq cm of product was utilized and 0 sq cm was wasted. Post Application, adaptic was applied. [name] Time Out was conducted at 08:28, prior to the start of the procedure. The procedure was tolerated well with [name] pain level of 0 throughout and [name] pain level of 0 following the procedure. Post procedure Diagnosis Wound #6: Same as Pre-Procedure . Pre-procedure diagnosis of Wound #6 is [name] Lymphedema located on the Left,Lateral Lower Leg  . There was [name] Three Layer Compression Therapy Procedure by Samuella Bruin, RN. Post procedure Diagnosis Wound #6: Same as Pre-Procedure Plan Follow-up Appointments: Return Appointment in 1 week. - Dr. Lady Gary Room 2 Anesthetic: (In clinic) Topical Lidocaine 4% applied to wound bed Cellular or Tissue Based Products: Wound #6 Left,Lateral Lower Leg: Cellular or Tissue Based Product Type: - Epifix #3 Cellular or Tissue Based Product applied to wound bed, secured with steri-strips, cover with Adaptic or Mepitel. (DO NOT REMOVE). Bathing/ Shower/ Hygiene: May shower with protection but do not get wound dressing(s) wet. Protect dressing(s) with water repellant cover (for example, large plastic bag) or [name] cast cover and may then take shower. - Keep legs dry until next appointment. Use Cast Protectors if so desired. Other Bathing/Shower/Hygiene Orders/Instructions: - May want to sponge bath if legs cannot be kept dry until next appointment. Additional Orders / Instructions: Follow Nutritious Diet - Try and increase Protein intake. The goal is 70g-100g per day. Examples are chicken, fish, meat, pork, Greek yogurt, eggs etc. Lymphedema Treatment Plan - Exercise, Compression and Elevation: Exercise daily as tolerated. (Walking, ROM, Calf Pumps and T T oe aps) Compression Wraps as ordered Elevate legs 30 - 60 minutes at or above heart level at least 3 - 4 times daily as able/tolerated Avoid standing for long periods and elevate  leg(s) parallel to the floor when sitting Laboratory ordered were: Anaerobic culture - PCR of nonhealing wound to left lateral lower leg The following medication(s) was prescribed: lidocaine topical 4 % cream cream topical was prescribed at facility WOUND #3: - Lower Leg Wound Laterality: Left, Medial, Proximal Cleanser: Soap and Water 1 x Per Week/30 Days Discharge Instructions: At Wound center-May shower and wash wound with dial antibacterial soap and water prior to  dressing change. Peri-Wound Care: Triamcinolone 15 (g) 1 x Per Week/30 Days Discharge Instructions: Use triamcinolone 15 (g) as directed Peri-Wound Care: Sween Lotion (Moisturizing lotion) 1 x Per Week/30 Days Discharge Instructions: Apply moisturizing lotion as directed Prim Dressing: Hydrofera Blue Ready Transfer Foam, 2.5x2.5 (in/in) 1 x Per Week/30 Days ary Discharge Instructions: Apply directly to wound bed as directed Secondary Dressing: Woven Gauze Sponge, Non-Sterile 4x4 in 1 x Per Week/30 Days Discharge Instructions: Apply over primary dressing as directed. Com pression Wrap: Unnaboot w/Calamine, 4x10 (in/yd) 1 x Per Week/30 Days Discharge Instructions: Apply Unnaboot as directed. WOUND #6: - Lower Leg Wound Laterality: Left, Lateral Cleanser: Soap and Water 1 x Per Week/30 Days Discharge Instructions: At Wound center-May shower and wash wound with dial antibacterial soap and water prior to dressing change. Peri-Wound Care: Triamcinolone 15 (g) 1 x Per Week/30 Days Discharge Instructions: Use triamcinolone 15 (g) as directed Peri-Wound Care: Sween Lotion (Moisturizing lotion) 1 x Per Week/30 Days Discharge Instructions: Apply moisturizing lotion as directed Topical: Gentamicin 1 x Per Week/30 Days Discharge Instructions: As directed by physician Topical: Mupirocin Ointment 1 x Per Week/30 Days Discharge Instructions: Apply Mupirocin (Bactroban) as instructed Prim Dressing: ADAPTIC TOUCH 3x4.25 (in/in) 1 x Per Week/30 Days ary Discharge Instructions: Apply to wound bed as instructed Prim Dressing: Hydrofera Blue Ready Transfer Foam, 4x5 (in/in) 1 x Per Week/30 Days ary Discharge Instructions: Apply to wound bed as instructed Secondary Dressing: Woven Gauze Sponge, Non-Sterile 4x4 in 1 x Per Week/30 Days Discharge Instructions: Apply over primary dressing as directed. Com pression Wrap: Unnaboot w/Calamine, 4x10 (in/yd) 1 x Per Week/30 Days Discharge Instructions: Apply  Unnaboot as directed. Com pression Stockings: Jobst Farrow Wrap 4000 Compression Amount: 30-40 mmHg (left) Discharge Instructions: Apply Renee Pain daily as instructed. Apply first thing in the morning, remove at night before bed. Joan [name], Joan [name] ([id number]) [id number] Page 10 of 11 08/20/2023: The medial leg ulcer is nearly healed with just [name] tiny open area remaining underneath [name] layer of eschar. The lateral leg ulcer is unchanged and the surface remains [name] bit fibrotic. Edema control is better this week. I used [name] curette to debride eschar from the proximal wound and slough and subcutaneous tissue from the distal wound. Although it does not appear overtly infected, due to the lack of improvement, I did take [name] culture and will prescribe antibiotics if indicated by those results. For now, we will continue to use the mixture of topical gentamicin and mupirocin on the wound surface. EpiFix was applied in standard fashion with [facility] used as [name] bolster to hold the product on the wound surface. Everything was secured in place with Adaptic and Steri-Strips. Hydrofera Blue was applied to the proximal leg ulcer, as well. Calamine based Unna boot was applied for compression. Follow-up in 1 week. I expect the proximal wound will be healed at that time. Electronic Signature(s) Signed: 08/20/2023 8:38:59 AM By: Duanne Guess MD FACS Previous Signature: 08/20/2023 8:38:24 AM Version By: Duanne Guess MD FACS Entered By: Duanne Guess on 08/20/2023 08:38:59 -------------------------------------------------------------------------------- HxROS Details  Patient Name: Date of Service: Joan [name], [place] ZO [name]. 08/20/2023 7:45 [name] Medical Record Number: [id number] Patient Account Number: [id number] Date of Birth/Sex: Treating RN: 12-Aug-1966 (57 y.o. F) Primary Care Provider: PCP, [name]: Referring Provider: Treating Provider/Extender: Joan [name] in  Treatment: 7 Information Obtained From Patient Hematologic/Lymphatic Medical History: Positive for: Anemia Respiratory Medical History: Positive for: Asthma Cardiovascular Medical History: Positive for: Congestive Heart Failure; Deep Vein Thrombosis; Hypertension Past Medical History Notes: Morbidly Obese Musculoskeletal Medical History: Positive for: Osteoarthritis Immunizations Pneumococcal Vaccine: Received Pneumococcal Vaccination: No Implantable Devices None Hospitalization / Surgery History Type of Hospitalization/Surgery Tubal Ligation Family and Social History Unknown History: Yes; Never smoker; Marital Status - Divorced; Alcohol Use: Rarely; Drug Use: No History; Caffeine Use: Daily - Tea,Coffee ,Soda; Financial Concerns: No; Food, Clothing or Shelter Needs: No; Support System Lacking: No; Transportation Concerns: No Electronic Signature(s) Signed: 08/20/2023 8:41:54 AM By: Duanne Guess MD FACS Joan [name] ([id number]) [id number] Page 11 of 11 Signed: 08/20/2023 8:41:54 AM By: Duanne Guess MD FACS Entered By: Duanne Guess on 08/20/2023 08:36:01 -------------------------------------------------------------------------------- SuperBill Details Patient Name: Date of Service: Joan Officer, [place] Joan [name]. 08/20/2023 Medical Record Number: [id number] Patient Account Number: [id number] Date of Birth/Sex: Treating RN: 1966-03-27 (57 y.o. F) Primary Care Provider: PCP, [name]: Referring Provider: Treating Provider/Extender: Joan [name] in Treatment: 7 Diagnosis Coding ICD-10 Codes Code Description 2056003172 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osed I87.319 Chronic venous hypertension (idiopathic) with ulcer of unspecified lower extremity I89.0 Lymphedema, not elsewhere classified I50.32 Chronic diastolic (congestive) heart failure E66.01 Morbid (severe) obesity due to excess  calories Facility Procedures : CPT4 Code: 96295284 Description: Q4186 Epifix 2cm x 3cm Modifier: Quantity: 6 : CPT4 Code: 13244010 Description: 15271 - SKIN SUB GRAFT TRNK/ARM/LEG ICD-10 Diagnosis Description L97.822 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osed Modifier: Quantity: 1 : CPT4 Code: [id number] Description: 97597 - DEBRIDE WOUND 1ST 20 SQ CM OR < ICD-10 Diagnosis Description L97.822 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osed Modifier: Quantity: 1 Physician Procedures : CPT4 Code Description Modifier [id number] 99214 - WC PHYS LEVEL 4 - EST PT 25 ICD-10 Diagnosis Description L97.822 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osed I87.319 Chronic venous hypertension (idiopathic) with  ulcer of unspecified lower extremity I89.0 Lymphedema, not elsewhere classified I50.32 Chronic diastolic (congestive) heart failure Quantity: 1 : [id number] 15271 - WC PHYS SKIN SUB GRAFT TRNK/ARM/LEG ICD-10 Diagnosis Description L97.822 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osed Quantity: 1 : [id number] 97597 - WC PHYS DEBR WO ANESTH 20 SQ CM ICD-10 Diagnosis Description L97.822 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osed Quantity: 1 Electronic Signature(s) Signed: 08/20/2023 8:39:27 AM By: Duanne Guess MD FACS Entered By: Duanne Guess on 08/20/2023 08:39:26

## 2023-08-22 DIAGNOSIS — E538 Deficiency of other specified B group vitamins: Secondary | ICD-10-CM | POA: Diagnosis not present

## 2023-08-22 DIAGNOSIS — M79641 Pain in right hand: Secondary | ICD-10-CM | POA: Diagnosis not present

## 2023-08-22 DIAGNOSIS — F419 Anxiety disorder, unspecified: Secondary | ICD-10-CM | POA: Diagnosis not present

## 2023-08-22 DIAGNOSIS — Z6841 Body Mass Index (BMI) 40.0 and over, adult: Secondary | ICD-10-CM | POA: Diagnosis not present

## 2023-08-22 DIAGNOSIS — G8929 Other chronic pain: Secondary | ICD-10-CM | POA: Diagnosis not present

## 2023-08-22 DIAGNOSIS — M25562 Pain in left knee: Secondary | ICD-10-CM | POA: Diagnosis not present

## 2023-08-22 DIAGNOSIS — E119 Type 2 diabetes mellitus without complications: Secondary | ICD-10-CM | POA: Diagnosis not present

## 2023-08-22 DIAGNOSIS — Z79899 Other long term (current) drug therapy: Secondary | ICD-10-CM | POA: Diagnosis not present

## 2023-08-22 DIAGNOSIS — M5137 Other intervertebral disc degeneration, lumbosacral region with discogenic back pain only: Secondary | ICD-10-CM | POA: Diagnosis not present

## 2023-08-22 DIAGNOSIS — F331 Major depressive disorder, recurrent, moderate: Secondary | ICD-10-CM | POA: Diagnosis not present

## 2023-08-22 DIAGNOSIS — M25561 Pain in right knee: Secondary | ICD-10-CM | POA: Diagnosis not present

## 2023-08-27 DIAGNOSIS — Z79899 Other long term (current) drug therapy: Secondary | ICD-10-CM | POA: Diagnosis not present

## 2023-08-29 ENCOUNTER — Encounter (HOSPITAL_BASED_OUTPATIENT_CLINIC_OR_DEPARTMENT_OTHER): Payer: Medicare HMO | Attending: Internal Medicine | Admitting: Internal Medicine

## 2023-08-29 DIAGNOSIS — L97822 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osed: Secondary | ICD-10-CM | POA: Diagnosis present

## 2023-08-29 DIAGNOSIS — I110 Hypertensive heart disease with heart failure: Secondary | ICD-10-CM | POA: Diagnosis not present

## 2023-08-29 DIAGNOSIS — I5032 Chronic diastolic (congestive) heart failure: Secondary | ICD-10-CM | POA: Diagnosis not present

## 2023-08-29 DIAGNOSIS — I87319 Chronic venous hypertension (idiopathic) with ulcer of unspecified lower extremity: Secondary | ICD-10-CM | POA: Insufficient documentation

## 2023-08-29 DIAGNOSIS — Z86718 Personal history of other venous thrombosis and embolism: Secondary | ICD-10-CM | POA: Diagnosis not present

## 2023-08-29 DIAGNOSIS — I890 Lymphedema, not elsewhere classified: Secondary | ICD-10-CM | POA: Diagnosis not present

## 2023-08-30 NOTE — Progress Notes (Signed)
Joan, TROW [name] ([id number]) [id number] Page 1 of 7 Visit Report for 08/29/2023 Cellular or Tissue Based Product Details Patient Name: Date of Service: [name], [name] 08/29/2023 9:30 [name] Medical Record Number: [id number] Patient Account Number: [id number] Date of Birth/Sex: Treating RN: 12/20/1965 (57 y.o. F) Primary Care Provider: PCP, [name]: Referring Provider: Treating Provider/Extender: [name] in Treatment: 8 Cellular or Tissue Based Product Type Wound #6 Left,Lateral Lower Leg Applied to: Performed By: Physician [name]., MD The following information was scribed by: [name] The information was scribed for: [name] Cellular or Tissue Based Product Type: Epifix Level of Consciousness (Pre-procedure): Awake and Alert Pre-procedure Verification/Time Out Yes - 10:23 Taken: Location: trunk / arms / legs Wound Size (sq cm): 1.54 Product Size (sq cm): 6 Waste Size (sq cm): 0 Amount of Product Applied (sq cm): 6 Instrument Used: Forceps, Scissors Lot #: [id number] Order #: 4 Expiration Date: 02/24/2028 Fenestrated: No Reconstituted: Yes Solution Type: normal saline Solution Amount: 5 ml Lot #: [id number] KS Solution Expiration Date: 01/13/2025 Secured: Yes Secured With: Steri-Strips Dressing Applied: Yes Primary Dressing: adaptic, hydrofera blue Procedural [name]: 0 Post Procedural [name]: 0 Response to Treatment: Procedure was tolerated well Level of Consciousness (Post- Awake and Alert procedure): Post Procedure Diagnosis Same as Pre-procedure Electronic Signature(s) Signed: 08/30/2023 4:45:28 AM By: Joan Najjar MD Entered By: [name] on 08/29/2023 10:28:43 -------------------------------------------------------------------------------- HPI Details Patient Name: Date of Service: Joan Officer, [name] 08/29/2023 9:30 [name] Medical Record Number: [id number] Patient Account Number:  [id number] Date of Birth/Sex: Treating RN: 1966/01/17 (57 y.o. F) Primary Care Provider: PCP, [name]: Referring Provider: Treating Provider/Extender: [name] in Treatment: [address] [name] ([id number]) [id number] Page 2 of 7 History of Present Illness HPI Description: ADMISSION 07/02/2023 ***ABIs non-compressible bilaterally; triphasic Doppler signals*** This is [name] 57 year old morbidly obese patient who is not [name] diabetic. She does have hypertension and CHF. Although she does not carry this diagnosis in her electronic medical record, she clearly has lymphedema. She has multiple wounds on her bilateral lower extremities, 1 of which has been present for 3 years. She has been treating her wounds with peroxide and bacitracin. Her provider has given her various courses of antibiotics, but the wounds have not closed. She was told not to wear compression stockings due to her history of DVT but this was well over [name] year ago. She has never worn lymphedema pumps. , 07/09/2023: Several of the smaller wounds have closed. Remaining wounds include the left proximal medial ulcer and the left lateral distal ulcer, as well as the paired wounds on her right lateral lower leg. There is slough accumulation at all sites. 07/16/2023: The left distal lateral leg ulcer is [name] little bit smaller and quite [name] bit cleaner. The more proximal medial left lower leg ulcer still has [name] fair amount of nonviable tissue present and this more tender. Her wrap did slide down and it appears that it came to rest right in the middle of this wound causing some pressure on the site. The paired right lateral lower leg ulcers are small and superficial with [name] little bit of slough and eschar present. 07/23/2023: The right lateral lower leg ulcers are nearly closed but remain just barely open underneath [name] layer of eschar. The proximal medial left lower leg ulcer has less nonviable tissue present  today and is smaller. The left distal lateral leg ulcer is unchanged in size, but the surface is  clean and the quality of the tissue is improving. 07/30/2023: Both right leg ulcers are healed. The proximal medial left leg ulcer is smaller and more superficial. The left distal lateral leg ulcer is still unchanged in size. There is more granulation tissue filling in, but the surface is still fairly fibrotic. Edema control is adequate. 08/07/2023: She unfortunately had to travel out of town urgently to go to [place] this past week and as [name] result, was on her feet and with her legs in [name] dependent position quite [name] bit. This has resulted in the medial leg ulcer getting [name] little bit larger and the lateral leg ulcer getting [name] little bit deeper. Her juxta lite stockings were delivered and she has them with her today. She has been approved for Epifix so we will proceed with application today. 08/13/2023: The medial leg ulcer is quite [name] bit smaller this week and the lateral leg ulcer also measured slightly smaller but the tissue surface is healthier- looking. She has been on her feet again [name] lot this past week so her edema control remains poor. 08/20/2023: The medial leg ulcer is nearly healed with just [name] tiny open area remaining underneath [name] layer of eschar. The lateral leg ulcer is unchanged and the surface remains [name] bit fibrotic. Edema control is better this week. 12/3; the medial ulcer is healed. On the left lateral we still have [name] punched-out area with roughly 0.4 cm in depth. The granulation tissue at the base of this looks healthy. EpiFix reapplied in the [name] fashion. Electronic Signature(s) Signed: 08/30/2023 4:45:28 AM By: Joan Najjar MD Entered By: [name] on 08/29/2023 10:31:01 -------------------------------------------------------------------------------- Physical Exam Details Patient Name: Date of Service: Joan Officer, [name] 08/29/2023 9:30 [name] Medical Record Number:  [id number] Patient Account Number: [id number] Date of Birth/Sex: Treating RN: [birth year] (57 y.o. F) Primary Care Provider: PCP, [name]: Referring Provider: Treating Provider/Extender: [name] in Treatment: 8 Constitutional Patient is hypertensive.. Pulse regular and within target range for patient.Marland Kitchen Respirations regular, non-labored and within target range.. Temperature is normal and within the target range for the patient.. Notes Wound exam; the medial leg is healed this week. On the lateral leg wound as described previously roughly 0.4 cm in depth but with [name] generally healthy looking granulated wound bed. We applied epi fix in the [name] fashion back with gauze. We are using Unna boot wraps which I think are satisfactory here in terms of maintaining edema control Electronic Signature(s) Signed: 08/30/2023 4:45:28 AM By: Joan Najjar MD Entered By: [name] on 08/29/2023 10:32:32 [name] ([id number]) [id number] Page 3 of 7 -------------------------------------------------------------------------------- Physician Orders Details Patient Name: Date of Service: [name], [name] 08/29/2023 9:30 [name] Medical Record Number: [id number] Patient Account Number: [id number] Date of Birth/Sex: Treating RN: 08/18/1966 (57 y.o. [name] Primary Care Provider: PCP, [name]: Referring Provider: Treating Provider/Extender: [name] in Treatment: 8 The following information was scribed by: [name] The information was scribed for: [name] Verbal / Phone Orders: No Diagnosis Coding ICD-10 Coding Code Description [phone] 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osed I87.319 Chronic venous hypertension (idiopathic) with ulcer of unspecified lower extremity I89.0 Lymphedema, not elsewhere classified I50.32 Chronic diastolic (congestive) heart  failure E66.01 Morbid (severe) obesity due to excess calories Follow-up Appointments ppointment in 1 week. - Dr. Leanord Hawking Return [name] Anesthetic (In clinic) Topical Lidocaine 4% applied to wound  bed Cellular or Tissue Based Products Wound #6 Left,Lateral Lower Leg Cellular or Tissue Based Product Type: - Epifix #4 Cellular or Tissue Based Product applied to wound bed, secured with steri-strips, cover with Adaptic or Mepitel. (DO NOT REMOVE). Bathing/ Shower/ Hygiene May shower with protection but do not get wound dressing(s) wet. Protect dressing(s) with water repellant cover (for example, large plastic bag) or [name] cast cover and may then take shower. - Keep legs dry until next appointment. Use Cast Protectors if so desired. Other Bathing/Shower/Hygiene Orders/Instructions: - May want to sponge bath if legs cannot be kept dry until next appointment. Additional Orders / Instructions Follow Nutritious Diet - Try and increase Protein intake. The goal is 70g-100g per day. Examples are chicken, fish, meat, pork, Greek yogurt, eggs etc. Lymphedema Treatment Plan - Exercise, Compression and Elevation Exercise daily as tolerated. (Walking, ROM, Calf Pumps and Toe Taps) Compression Wraps as ordered Elevate legs 30 - 60 minutes at or above heart level at least 3 - 4 times daily as able/tolerated Avoid standing for long periods and elevate leg(s) parallel to the floor when sitting Wound Treatment Wound #6 - Lower Leg Wound Laterality: Left, Lateral Cleanser: Soap and Water 1 x Per Week/30 Days Discharge Instructions: At Wound center-May shower and wash wound with dial antibacterial soap and water prior to dressing change. Peri-Wound Care: Triamcinolone 15 (g) 1 x Per Week/30 Days Discharge Instructions: Use triamcinolone 15 (g) as directed Peri-Wound Care: Sween Lotion (Moisturizing lotion) 1 x Per Week/30 Days Discharge Instructions: Apply moisturizing lotion as directed Topical: Gentamicin 1 x Per  Week/30 Days Discharge Instructions: As directed by physician Topical: Mupirocin Ointment 1 x Per Week/30 Days Discharge Instructions: Apply Mupirocin (Bactroban) as instructed Prim Dressing: ADAPTIC TOUCH 3x4.25 (in/in) 1 x Per Week/30 Days ary Discharge Instructions: Apply to wound bed as instructed Prim Dressing: Hydrofera Blue Ready Transfer Foam, 4x5 (in/in) ary 1 x Per Week/30 Days Joan, PANZERA [name] ([id number]) [phone].pdf Page 4 of 7 Discharge Instructions: Apply to wound bed as instructed Secondary Dressing: Woven Gauze Sponge, Non-Sterile 4x4 in 1 x Per Week/30 Days Discharge Instructions: Apply over primary dressing as directed. Compression Wrap: Unnaboot w/Calamine, 4x10 (in/yd) 1 x Per Week/30 Days Discharge Instructions: Apply Unnaboot as directed. Compression Stockings: Jobst Farrow Wrap 4000 Left Leg Compression Amount: 30-40 mmHG Discharge Instructions: Apply [name] daily as instructed. Apply first thing in the morning, remove at night before bed. Electronic Signature(s) Signed: 08/29/2023 11:53:29 AM By: Joan Deed RN, BSN Signed: 08/30/2023 4:45:28 AM By: Joan Najjar MD Entered By: [name] on 08/29/2023 10:29:31 -------------------------------------------------------------------------------- Problem List Details Patient Name: Date of Service: Joan Officer, [name] 08/29/2023 9:30 [name] Medical Record Number: [id number] Patient Account Number: [id number] Date of Birth/Sex: Treating RN: 09-Mar-1966 (57 y.o. [name] Primary Care Provider: PCP, [name]: Referring Provider: Treating Provider/Extender: [name] in Treatment: 8 Active Problems ICD-10 Encounter Code Description Active Date MDM Diagnosis L97.822 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osed10/03/2023 No Yes I87.319 Chronic venous hypertension (idiopathic) with ulcer of unspecified lower 07/02/2023  No Yes extremity I89.0 Lymphedema, not elsewhere classified 07/02/2023 No Yes I50.32 Chronic diastolic (congestive) heart failure 07/02/2023 No Yes E66.01 Morbid (severe) obesity due to excess calories 07/02/2023 No Yes Inactive Problems Resolved Problems ICD-10 Code Description Active Date Resolved Date L97.812 Non-pressure chronic ulcer of other part of right lower leg with fat layer exposed 07/02/2023 07/02/2023 Electronic Signature(s) [name] (  [id number]) [id number] Page 5 of 7 Signed: 08/30/2023 4:45:28 AM By: Joan Najjar MD Entered By: [name] on 08/29/2023 10:28:21 -------------------------------------------------------------------------------- Progress Note Details Patient Name: Date of Service: Joan Officer, [name] 08/29/2023 9:30 [name] Medical Record Number: [id number] Patient Account Number: [id number] Date of Birth/Sex: Treating RN: July 01, 1966 (57 y.o. F) Primary Care Provider: PCP, [name]: Referring Provider: Treating Provider/Extender: [name] in Treatment: 8 Subjective History of Present Illness (HPI) ADMISSION 07/02/2023 ***ABIs non-compressible bilaterally; triphasic Doppler signals*** This is [name] 57 year old morbidly obese patient who is not [name] diabetic. She does have hypertension and CHF. Although she does not carry this diagnosis in her electronic medical record, she clearly has lymphedema. She has multiple wounds on her bilateral lower extremities, 1 of which has been present for 3 years. She has been treating her wounds with peroxide and bacitracin. Her provider has given her various courses of antibiotics, but the wounds have not closed. She was told not to wear compression stockings due to her history of DVT but this was well over [name] year ago. She has never worn lymphedema pumps. , 07/09/2023: Several of the smaller wounds have closed. Remaining wounds include the left proximal medial ulcer and the left  lateral distal ulcer, as well as the paired wounds on her right lateral lower leg. There is slough accumulation at all sites. 07/16/2023: The left distal lateral leg ulcer is [name] little bit smaller and quite [name] bit cleaner. The more proximal medial left lower leg ulcer still has [name] fair amount of nonviable tissue present and this more tender. Her wrap did slide down and it appears that it came to rest right in the middle of this wound causing some pressure on the site. The paired right lateral lower leg ulcers are small and superficial with [name] little bit of slough and eschar present. 07/23/2023: The right lateral lower leg ulcers are nearly closed but remain just barely open underneath [name] layer of eschar. The proximal medial left lower leg ulcer has less nonviable tissue present today and is smaller. The left distal lateral leg ulcer is unchanged in size, but the surface is clean and the quality of the tissue is improving. 07/30/2023: Both right leg ulcers are healed. The proximal medial left leg ulcer is smaller and more superficial. The left distal lateral leg ulcer is still unchanged in size. There is more granulation tissue filling in, but the surface is still fairly fibrotic. Edema control is adequate. 08/07/2023: She unfortunately had to travel out of town urgently to go to [place] this past week and as [name] result, was on her feet and with her legs in [name] dependent position quite [name] bit. This has resulted in the medial leg ulcer getting [name] little bit larger and the lateral leg ulcer getting [name] little bit deeper. Her juxta lite stockings were delivered and she has them with her today. She has been approved for Epifix so we will proceed with application today. 08/13/2023: The medial leg ulcer is quite [name] bit smaller this week and the lateral leg ulcer also measured slightly smaller but the tissue surface is healthier- looking. She has been on her feet again [name] lot this past week so her edema control remains  poor. 08/20/2023: The medial leg ulcer is nearly healed with just [name] tiny open area remaining underneath [name] layer of eschar. The lateral leg ulcer is unchanged and the surface remains [name] bit fibrotic. Edema control is better this week. 12/3; the  medial ulcer is healed. On the left lateral we still have [name] punched-out area with roughly 0.4 cm in depth. The granulation tissue at the base of this looks healthy. EpiFix reapplied in the [name] fashion. Objective Constitutional Patient is hypertensive.. Pulse regular and within target range for patient.Marland Kitchen Respirations regular, non-labored and within target range.. Temperature is normal and within the target range for the patient.. Vitals Time Taken: 9:54 AM, Height: 71 in, Weight: 352 lbs, BMI: 49.1, Temperature: 98.3 F, Pulse: 118 bpm, Respiratory Rate: 18 breaths/min, Blood Pressure: 187/103 mmHg. General Notes: Wound exam; the medial leg is healed this week. On the lateral leg wound as described previously roughly 0.4 cm in depth but with [name] generally healthy looking granulated wound bed. We applied epi fix in the [name] fashion back with gauze. We are using Unna boot wraps which I think are satisfactory here in terms of maintaining edema control Integumentary (Hair, Skin) Wound #3 status is Healed - Epithelialized. Original cause of wound was Gradually Appeared. The date acquired was: 04/01/2023. The wound has been in treatment 8 weeks. The wound is located on the Left,Proximal,Medial Lower Leg. The wound measures 0cm length x 0cm width x 0cm depth; 0cm^2 area and 0cm^3 volume. There is no tunneling or undermining noted. There is [name] none present amount of drainage noted. There is no granulation within the wound bed. Joan, KECKLER [name] ([id number]) [id number] Page 6 of 7 There is no necrotic tissue within the wound bed. The periwound skin appearance had no abnormalities noted for texture. The periwound skin appearance had  no abnormalities noted for moisture. The periwound skin appearance exhibited: Hemosiderin Staining. Periwound temperature was noted as No Abnormality. Wound #6 status is Open. Original cause of wound was Gradually Appeared. The date acquired was: 08/09/2020. The wound has been in treatment 8 weeks. The wound is located on the Left,Lateral Lower Leg. The wound measures 1.4cm length x 1.1cm width x 0.4cm depth; 1.21cm^2 area and 0.484cm^3 volume. There is Fat Layer (Subcutaneous Tissue) exposed. There is no tunneling or undermining noted. There is [name] medium amount of serosanguineous drainage noted. The wound margin is distinct with the outline attached to the wound base. There is large (67-100%) pink granulation within the wound bed. There is [name] small (1-33%) amount of necrotic tissue within the wound bed including Adherent Slough. The periwound skin appearance had no abnormalities noted for texture. The periwound skin appearance had no abnormalities noted for moisture. The periwound skin appearance exhibited: Hemosiderin Staining. Periwound temperature was noted as No Abnormality. Assessment Active Problems ICD-10 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osed Chronic venous hypertension (idiopathic) with ulcer of unspecified lower extremity Lymphedema, not elsewhere classified Chronic diastolic (congestive) heart failure Morbid (severe) obesity due to excess calories Procedures Wound #6 Pre-procedure diagnosis of Wound #6 is [name] Lymphedema located on the Left,Lateral Lower Leg. [name] skin graft procedure using [name] bioengineered skin substitute/cellular or tissue based product was performed by [name]., MD with the following instrument(s): Forceps and Scissors. Epifix was applied and secured with Steri-Strips. 6 sq cm of product was utilized and 0 sq cm was wasted. Post Application, adaptic, hydrofera blue was applied. [name] Time Out was conducted at 10:23, prior  to the start of the procedure. The procedure was tolerated well with [name] [name] level of 0 throughout and [name] [name] level of 0 following the procedure. Post procedure Diagnosis Wound #6: Same as Pre-Procedure . Pre-procedure diagnosis of Wound #6 is  [name] Lymphedema located on the Left,Lateral Lower Leg . There was [name] Radio broadcast assistant Compression Therapy Procedure by Joan Deed, RN. Post procedure Diagnosis Wound #6: Same as Pre-Procedure Plan Follow-up Appointments: Return Appointment in 1 week. - Dr. Leanord Hawking Anesthetic: (In clinic) Topical Lidocaine 4% applied to wound bed Cellular or Tissue Based Products: Wound #6 Left,Lateral Lower Leg: Cellular or Tissue Based Product Type: - Epifix #4 Cellular or Tissue Based Product applied to wound bed, secured with steri-strips, cover with Adaptic or Mepitel. (DO NOT REMOVE). Bathing/ Shower/ Hygiene: May shower with protection but do not get wound dressing(s) wet. Protect dressing(s) with water repellant cover (for example, large plastic bag) or [name] cast cover and may then take shower. - Keep legs dry until next appointment. Use Cast Protectors if so desired. Other Bathing/Shower/Hygiene Orders/Instructions: - May want to sponge bath if legs cannot be kept dry until next appointment. Additional Orders / Instructions: Follow Nutritious Diet - Try and increase Protein intake. The goal is 70g-100g per day. Examples are chicken, fish, meat, pork, Greek yogurt, eggs etc. Lymphedema Treatment Plan - Exercise, Compression and Elevation: Exercise daily as tolerated. (Walking, ROM, Calf Pumps and T T oe aps) Compression Wraps as ordered Elevate legs 30 - 60 minutes at or above heart level at least 3 - 4 times daily as able/tolerated Avoid standing for long periods and elevate leg(s) parallel to the floor when sitting WOUND #6: - Lower Leg Wound Laterality: Left, Lateral Cleanser: Soap and Water 1 x Per Week/30 Days Discharge Instructions: At Wound center-May shower and  wash wound with dial antibacterial soap and water prior to dressing change. Peri-Wound Care: Triamcinolone 15 (g) 1 x Per Week/30 Days Discharge Instructions: Use triamcinolone 15 (g) as directed Peri-Wound Care: Sween Lotion (Moisturizing lotion) 1 x Per Week/30 Days Discharge Instructions: Apply moisturizing lotion as directed Topical: Gentamicin 1 x Per Week/30 Days Discharge Instructions: As directed by physician Topical: Mupirocin Ointment 1 x Per Week/30 Days Discharge Instructions: Apply Mupirocin (Bactroban) as instructed Prim Dressing: ADAPTIC TOUCH 3x4.25 (in/in) 1 x Per Week/30 Days ary Discharge Instructions: Apply to wound bed as instructed Prim Dressing: Hydrofera Blue Ready Transfer Foam, 4x5 (in/in) 1 x Per Week/30 Days ary Discharge Instructions: Apply to wound bed as instructed Secondary Dressing: Woven Gauze Sponge, Non-Sterile 4x4 in 1 x Per Week/30 Days Discharge Instructions: Apply over primary dressing as directed. Joan, TOLEDANO [name] ([id number]) [id number] Page 7 of 7 Compression Wrap: Unnaboot w/Calamine, 4x10 (in/yd) 1 x Per Week/30 Days Discharge Instructions: Apply Unnaboot as directed. Compression Stockings: Jobst Farrow Wrap 4000 Compression Amount: 30-40 mmHg (left) Discharge Instructions: Apply [name] daily as instructed. Apply first thing in the morning, remove at night before bed. 1. Epi fix applied in the [name] fashion #4 2. Appears to be causing significant improvement in the depth and granulation of this wound Electronic Signature(s) Signed: 08/30/2023 4:45:28 AM By: Joan Najjar MD Entered By: [name] on 08/29/2023 10:33:55 -------------------------------------------------------------------------------- SuperBill Details Patient Name: Date of Service: Joan Officer, [name] 08/29/2023 Medical Record Number: [id number] Patient Account Number: [id number] Date of Birth/Sex: Treating RN: 11-17-1965 (57 y.o.  F) Primary Care Provider: PCP, [name]: Referring Provider: Treating Provider/Extender: [name] in Treatment: 8 Diagnosis Coding ICD-10 Codes Code Description [phone] 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osed I87.319 Chronic venous hypertension (idiopathic) with ulcer of unspecified lower extremity I89.0 Lymphedema, not elsewhere classified I50.32 Chronic diastolic (congestive) heart failure E66.01 Morbid (  severe) obesity due to excess calories Facility Procedures : CPT4 Code: [id number] Description: Q4186 Epifix 2cm x 3cm Modifier: Quantity: 6 : CPT4 Code: [id number] Description: 15271 - SKIN SUB GRAFT TRNK/ARM/LEG ICD-10 Diagnosis Description L97.822 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o I87.319 Chronic venous hypertension (idiopathic) with ulcer of unspecified lower extre Modifier: sed mity Quantity: 1 Physician Procedures : CPT4 Code Description Modifier [id number] - WC PHYS SKIN SUB GRAFT TRNK/ARM/LEG ICD-10 Diagnosis Description L97.822 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osed I87.319 Chronic venous hypertension (idiopathic)  with ulcer of unspecified lower extremity Quantity: 1 Electronic Signature(s) Signed: 08/29/2023 11:53:29 AM By: Joan Deed RN, BSN Signed: 08/30/2023 4:45:28 AM By: Joan Najjar MD Entered By: [name] on 08/29/2023 10:44:49

## 2023-08-30 NOTE — Progress Notes (Signed)
SHAILAH, WIEDERKEHR A ([id number]) [id number] Page 1 of 8 Visit Report for 08/29/2023 Arrival Information Details Patient Name: Date of Service: A [name], [name] 08/29/2023 9:30 A M Medical Record Number: [id number] Patient Account Number: [id number] Date of Birth/Sex: Treating RN: April 18, 1966 (57 y.o. F) Primary Care [name]: PCP, [name]: Referring [name]: Treating [name]/Extender: [name] in Treatment: 8 Visit Information History Since Last Visit Added or deleted any medications: No Patient Arrived: Cane Any new allergies or adverse reactions: No Arrival Time: 09:54 Had a fall or experienced change in No Accompanied By: self activities of daily living that may affect Transfer Assistance: None risk of falls: Patient Identification Verified: Yes Signs or symptoms of abuse/neglect since last visito No Secondary Verification Process Completed: Yes Hospitalized since last visit: No Patient Has Alerts: Yes Implantable device outside of the clinic excluding No Patient Alerts: Lasix cellular tissue based products placed in the center ABI R [name] (07/02/23) since last visit: ABI L Lockney (07/02/23) Has Dressing in Place as Prescribed: Yes Has Compression in Place as Prescribed: Yes [name] Present Now: No Electronic Signature(s) Signed: 08/29/2023 11:53:29 AM By: Joan Deed RN, BSN Entered By: [name] on 08/29/2023 09:59:40 -------------------------------------------------------------------------------- Compression Therapy Details Patient Name: Date of Service: [name], Joan RY A. 08/29/2023 9:30 A M Medical Record Number: [id number] Patient Account Number: [id number] Date of Birth/Sex: Treating RN: 10/15/1965 (57 y.o. [name] Primary Care [name]: PCP, [name]: Referring [name]: Treating [name]/Extender: [name] in Treatment: 8 Compression Therapy Performed for Wound Assessment: Wound #6  Left,Lateral Lower Leg Performed By: Clinician Joan Deed, RN Compression Type: Henriette Combs Post Procedure Diagnosis Same as Pre-procedure Electronic Signature(s) Signed: 08/29/2023 11:53:29 AM By: Joan Deed RN, BSN Entered By: [name] on 08/29/2023 10:23:04 [name] ([id number]) [id number] Page 2 of 8 -------------------------------------------------------------------------------- Encounter Discharge Information Details Patient Name: Date of Service: A [name], [name] 08/29/2023 9:30 A M Medical Record Number: [id number] Patient Account Number: [id number] Date of Birth/Sex: Treating RN: 1966-04-30 (57 y.o. [name] Primary Care [name]: PCP, [name]: Referring [name]: Treating [name]/Extender: [name] in Treatment: 8 Encounter Discharge Information Items Post Procedure Vitals Discharge Condition: Stable Temperature (F): 98.3 Ambulatory Status: Cane Pulse (bpm): 118 Discharge Destination: Home Respiratory Rate (breaths/min): 20 Transportation: Private Auto Blood Pressure (mmHg): 187/103 Accompanied By: self Schedule Follow-up Appointment: Yes Clinical Summary of Care: Patient Declined Electronic Signature(s) Signed: 08/29/2023 11:53:29 AM By: Joan Deed RN, BSN Entered By: [name] on 08/29/2023 10:45:49 -------------------------------------------------------------------------------- Lower Extremity Assessment Details Patient Name: Date of Service: [name], Joan RY A. 08/29/2023 9:30 A M Medical Record Number: [id number] Patient Account Number: [id number] Date of Birth/Sex: Treating RN: 10-14-1965 (57 y.o. [name] Primary Care [name]: PCP, [name]: Referring [name]: Treating [name]/Extender: [name] in Treatment: 8 Edema Assessment Assessed: [Left: No] [Right: No] Edema: [Left: Ye] [Right: s] Calf Left: Right: Point of Measurement:  32 cm From Medial Instep 43 cm Ankle Left: Right: Point of Measurement: 10 cm From Medial Instep 30.5 cm Vascular Assessment Pulses: Dorsalis Pedis Palpable: [Left:Yes] Extremity colors, hair growth, and conditions: Extremity Color: [Left:Hyperpigmented] Hair Growth on Extremity: [Left:No] Temperature of Extremity: [Left:Warm] Capillary Refill: [Left:< 3 seconds] Dependent Rubor: [Left:No No] Electronic Signature(s) Signed: 08/29/2023 11:53:29 AM By: Joan Deed RN, BSN Entered By: [name] on 08/29/2023 10:02:11 [name] ([id number]) [id number] Page 3 of 8 -------------------------------------------------------------------------------- Multi Wound Chart Details Patient Name:  Date of Service: A [name], [name] 08/29/2023 9:30 A M Medical Record Number: [id number] Patient Account Number: [id number] Date of Birth/Sex: Treating RN: August 06, 1966 (57 y.o. F) Primary Care [name]: PCP, [name]: Referring [name]: Treating [name]/Extender: [name] in Treatment: 8 Vital Signs Height(in): 71 Pulse(bpm): 118 Weight(lbs): 352 Blood Pressure(mmHg): 187/103 Body Mass Index(BMI): 49.1 Temperature(F): 98.3 Respiratory Rate(breaths/min): 18 [3:Photos:] [N/A:N/A] Left, Proximal, Medial Lower Leg Left, Lateral Lower Leg N/A Wound Location: Gradually Appeared Gradually Appeared N/A Wounding Event: [name] N/A Primary Etiology: Anemia, Asthma, Congestive Heart Anemia, Asthma, Congestive Heart N/A Comorbid History: Failure, Deep Vein Thrombosis, Failure, Deep Vein Thrombosis, Hypertension, Osteoarthritis Hypertension, Osteoarthritis 04/01/2023 08/09/2020 N/A Date Acquired: 8 8 N/A Weeks of Treatment: Healed - Epithelialized Open N/A Wound Status: No No N/A Wound Recurrence: 0x0x0 1.4x1.1x0.4 N/A Measurements L x W x D (cm) 0 1.21 N/A A (cm) : rea 0 0.484 N/A Volume (cm) : 100.00% 66.50% N/A %  Reduction in Area: 100.00% 55.40% N/A % Reduction in Volume: Full Thickness Without Exposed Full Thickness Without Exposed N/A Classification: Support Structures Support Structures None Present Medium N/A Exudate Amount: N/A Serosanguineous N/A Exudate Type: N/A red, brown N/A Exudate Color: N/A Distinct, outline attached N/A Wound Margin: None Present (0%) Large (67-100%) N/A Granulation Amount: N/A Pink N/A Granulation Quality: None Present (0%) Small (1-33%) N/A Necrotic Amount: Fascia: No Fat Layer (Subcutaneous Tissue): Yes N/A Exposed Structures: Fat Layer (Subcutaneous Tissue): No Fascia: No Tendon: No Tendon: No Muscle: No Muscle: No Joint: No Joint: No Bone: No Bone: No Large (67-100%) Small (1-33%) N/A Epithelialization: No Abnormalities Noted Scarring: Yes N/A Periwound Skin Texture: No Abnormalities Noted No Abnormalities Noted N/A Periwound Skin Moisture: Hemosiderin Staining: Yes Hemosiderin Staining: Yes N/A Periwound Skin Color: No Abnormality No Abnormality N/A Temperature: N/A Cellular or Tissue Based Product N/A Procedures Performed: Compression Therapy Treatment Notes Electronic Signature(s) Signed: 08/30/2023 4:45:28 AM By: Baltazar Najjar MD Entered By: Baltazar Najjar on 08/29/2023 10:28:35 [name] ([id number]) [id number] Page 4 of 8 -------------------------------------------------------------------------------- Multi-Disciplinary Care Plan Details Patient Name: Date of Service: A [name], [name] 08/29/2023 9:30 A M Medical Record Number: [id number] Patient Account Number: [id number] Date of Birth/Sex: Treating RN: [birth year] (57 y.o. [name] Primary Care Candis Kabel: PCP, [name]: Referring Antolin Belsito: Treating Kayin Kettering/Extender: [name] in Treatment: 8 Multidisciplinary Care Plan reviewed with physician Active Inactive Venous Leg Ulcer Nursing Diagnoses: Actual  venous Insuffiency (use after diagnosis is confirmed) Knowledge deficit related to disease process and management Goals: Patient will maintain optimal edema control Date Initiated: 08/29/2023 Target Resolution Date: 09/26/2023 Goal Status: Active Interventions: Assess peripheral edema status every visit. Compression as ordered Provide education on venous insufficiency Treatment Activities: Therapeutic compression applied : 08/29/2023 Notes: Wound/Skin Impairment Nursing Diagnoses: Impaired tissue integrity Goals: Patient/caregiver will verbalize understanding of skin care regimen Date Initiated: 07/02/2023 Target Resolution Date: 09/25/2023 Goal Status: Active Interventions: Assess ulceration(s) every visit Treatment Activities: Skin care regimen initiated : 07/02/2023 Notes: Electronic Signature(s) Signed: 08/29/2023 11:53:29 AM By: Joan Deed RN, BSN Entered By: [name] on 08/29/2023 10:09:29 -------------------------------------------------------------------------------- [name] Assessment Details Patient Name: Date of Service: [name], Joan RY A. 08/29/2023 9:30 A M Medical Record Number: [id number] Patient Account Number: [id number] [name], [name] ([id number]) [phone].pdf Page 5 of 8 Date of Birth/Sex: Treating RN: November 12, 1965 (57 y.o. F) Primary Care Roston Grunewald: PCP, [name]: Referring Jaqwan Wieber: Treating Blima Jaimes/Extender: [name] in Treatment: 8 Active Problems Location of [name]  Severity and Description of [name] Patient Has Paino No Site Locations Rate the [name]. Current [name] Level: 0 [name] Management and Medication Current [name] Management: Electronic Signature(s) Signed: 08/29/2023 11:53:29 AM By: Joan Deed RN, BSN Entered By: [name] on 08/29/2023 09:59:54 -------------------------------------------------------------------------------- Patient/Caregiver Education Details Patient Name: Date of  Service: [name], Joan RY A. 12/4/2024andnbsp9:30 A M Medical Record Number: [id number] Patient Account Number: [id number] Date of Birth/Gender: Treating RN: 15-Mar-1966 (57 y.o. [name] Primary Care Physician: PCP, [name]: Referring Physician: Treating Physician/Extender: [name] in Treatment: 8 Education Assessment Education Provided To: Patient Education Topics Provided Venous: Methods: Explain/Verbal Responses: Reinforcements needed, State content correctly Wound/Skin Impairment: Methods: Explain/Verbal Responses: Reinforcements needed, State content correctly Electronic Signature(s) Signed: 08/29/2023 11:53:29 AM By: Joan Deed RN, BSN Entered By: [name] on 08/29/2023 10:10:00 [name] ([id number]) [id number] Page 6 of 8 -------------------------------------------------------------------------------- Wound Assessment Details Patient Name: Date of Service: A [name], [name] 08/29/2023 9:30 A M Medical Record Number: [id number] Patient Account Number: [id number] Date of Birth/Sex: Treating RN: Jan 29, 1966 (57 y.o. [name] Primary Care Long Brimage: PCP, [name]: Referring Omara Alcon: Treating Alessandro Griep/Extender: [name] in Treatment: 8 Wound Status Wound Number: 3 Primary [name] Etiology: Wound Location: Left, Proximal, Medial Lower Leg Wound Healed - Epithelialized Wounding Event: Gradually Appeared Status: Date Acquired: 04/01/2023 Comorbid Anemia, Asthma, Congestive Heart Failure, Deep Vein Weeks Of Treatment: 8 History: Thrombosis, Hypertension, Osteoarthritis Clustered Wound: No Photos Wound Measurements Length: (cm) Width: (cm) Depth: (cm) Area: (cm) Volume: (cm) 0 % Reduction in Area: 100% 0 % Reduction in Volume: 100% 0 Epithelialization: Large (67-100%) 0 Tunneling: No 0 Undermining: No Wound Description Classification: Full Thickness Without  Exposed Support Structures Exudate Amount: None Present Foul Odor After Cleansing: No Slough/Fibrino No Wound Bed Granulation Amount: None Present (0%) Exposed Structure Necrotic Amount: None Present (0%) Fascia Exposed: No Fat Layer (Subcutaneous Tissue) Exposed: No Tendon Exposed: No Muscle Exposed: No Joint Exposed: No Bone Exposed: No Periwound Skin Texture Texture Color No Abnormalities Noted: Yes No Abnormalities Noted: No Hemosiderin Staining: Yes Moisture No Abnormalities Noted: Yes Temperature / [name] Temperature: No Abnormality Electronic Signature(s) Signed: 08/29/2023 11:53:29 AM By: Joan Deed RN, BSN Entered By: [name] on 08/29/2023 10:07:08 [name] ([id number]) [id number] Page 7 of 8 -------------------------------------------------------------------------------- Wound Assessment Details Patient Name: Date of Service: A [name], [name] 08/29/2023 9:30 A M Medical Record Number: [id number] Patient Account Number: [id number] Date of Birth/Sex: Treating RN: [birth year] (57 y.o. [name] Primary Care Khalaya Mcgurn: PCP, [name]: Referring Rozina Pointer: Treating Lakaya Tolen/Extender: [name] in Treatment: 8 Wound Status Wound Number: 6 Primary [name] Etiology: Wound Location: Left, Lateral Lower Leg Wound Open Wounding Event: Gradually Appeared Status: Date Acquired: 08/09/2020 Comorbid Anemia, Asthma, Congestive Heart Failure, Deep Vein Weeks Of Treatment: 8 History: Thrombosis, Hypertension, Osteoarthritis Clustered Wound: No Photos Wound Measurements Length: (cm) 1.4 Width: (cm) 1.1 Depth: (cm) 0.4 Area: (cm) 1.21 Volume: (cm) 0.484 % Reduction in Area: 66.5% % Reduction in Volume: 55.4% Epithelialization: Small (1-33%) Tunneling: No Undermining: No Wound Description Classification: Full Thickness Without Exposed Suppor Wound Margin: Distinct, outline attached Exudate  Amount: Medium Exudate Type: Serosanguineous Exudate Color: red, brown t Structures Foul Odor After Cleansing: No Slough/Fibrino Yes Wound Bed Granulation Amount: Large (67-100%) Exposed Structure Granulation Quality: Pink Fascia Exposed: No Necrotic Amount: Small (1-33%) Fat Layer (Subcutaneous Tissue) Exposed: Yes Necrotic Quality: Adherent Slough Tendon Exposed: No Muscle Exposed: No Joint  Exposed: No Bone Exposed: No Periwound Skin Texture Texture Color No Abnormalities Noted: Yes No Abnormalities Noted: No Hemosiderin Staining: Yes Moisture No Abnormalities Noted: Yes Temperature / [name] Temperature: No Abnormality Treatment Notes Wound #6 (Lower Leg) Wound Laterality: Left, Lateral Cleanser Soap and Water [name], Joan A ([id number]) [id number] Page 8 of 8 Discharge Instruction: At Wound center-May shower and wash wound with dial antibacterial soap and water prior to dressing change. Peri-Wound Care Triamcinolone 15 (g) Discharge Instruction: Use triamcinolone 15 (g) as directed Sween Lotion (Moisturizing lotion) Discharge Instruction: Apply moisturizing lotion as directed Topical Gentamicin Discharge Instruction: As directed by physician Mupirocin Ointment Discharge Instruction: Apply Mupirocin (Bactroban) as instructed Primary Dressing ADAPTIC TOUCH 3x4.25 (in/in) Discharge Instruction: Apply to wound bed as instructed Hydrofera Blue Ready Transfer Foam, 4x5 (in/in) Discharge Instruction: Apply to wound bed as instructed Secondary Dressing Woven Gauze Sponge, Non-Sterile 4x4 in Discharge Instruction: Apply over primary dressing as directed. Secured With Compression Wrap Unnaboot w/Calamine, 4x10 (in/yd) Discharge Instruction: Apply Unnaboot as directed. Compression Stockings Jobst Farrow Wrap 4000 Quantity: 1 Left Leg Compression Amount: 30-40 mmHg Discharge Instruction: Apply [name] daily as instructed. Apply first thing in  the morning, remove at night before bed. Add-Ons Electronic Signature(s) Signed: 08/29/2023 11:53:29 AM By: Joan Deed RN, BSN Previous Signature: 08/29/2023 10:03:47 AM Version By: Dayton Scrape Entered By: [name] on 08/29/2023 10:07:33 -------------------------------------------------------------------------------- Vitals Details Patient Name: Date of Service: [name], Joan RY A. 08/29/2023 9:30 A M Medical Record Number: [id number] Patient Account Number: [id number] Date of Birth/Sex: Treating RN: August 19, 1966 (57 y.o. F) Primary Care Alanmichael Barmore: PCP, [name]: Referring Kerrington Sova: Treating Jerrico Covello/Extender: [name] in Treatment: 8 Vital Signs Time Taken: 09:54 Temperature (F): 98.3 Height (in): 71 Pulse (bpm): 118 Weight (lbs): 352 Respiratory Rate (breaths/min): 18 Body Mass Index (BMI): 49.1 Blood Pressure (mmHg): 187/103 Reference Range: 80 - 120 mg / dl Electronic Signature(s) Signed: 08/29/2023 11:53:29 AM By: Joan Deed RN, BSN Entered By: [name] on 08/29/2023 09:59:46

## 2023-09-06 ENCOUNTER — Encounter (HOSPITAL_BASED_OUTPATIENT_CLINIC_OR_DEPARTMENT_OTHER): Payer: Medicare HMO | Admitting: Internal Medicine

## 2023-09-06 DIAGNOSIS — I87319 Chronic venous hypertension (idiopathic) with ulcer of unspecified lower extremity: Secondary | ICD-10-CM | POA: Diagnosis not present

## 2023-09-07 DIAGNOSIS — E7800 Pure hypercholesterolemia, unspecified: Secondary | ICD-10-CM | POA: Diagnosis not present

## 2023-09-07 DIAGNOSIS — R600 Localized edema: Secondary | ICD-10-CM | POA: Diagnosis not present

## 2023-09-07 DIAGNOSIS — Z79899 Other long term (current) drug therapy: Secondary | ICD-10-CM | POA: Diagnosis not present

## 2023-09-07 DIAGNOSIS — I10 Essential (primary) hypertension: Secondary | ICD-10-CM | POA: Diagnosis not present

## 2023-09-07 DIAGNOSIS — E119 Type 2 diabetes mellitus without complications: Secondary | ICD-10-CM | POA: Diagnosis not present

## 2023-09-07 DIAGNOSIS — Z6841 Body Mass Index (BMI) 40.0 and over, adult: Secondary | ICD-10-CM | POA: Diagnosis not present

## 2023-09-07 DIAGNOSIS — F331 Major depressive disorder, recurrent, moderate: Secondary | ICD-10-CM | POA: Diagnosis not present

## 2023-09-07 DIAGNOSIS — K219 Gastro-esophageal reflux disease without esophagitis: Secondary | ICD-10-CM | POA: Diagnosis not present

## 2023-09-07 DIAGNOSIS — E559 Vitamin D deficiency, unspecified: Secondary | ICD-10-CM | POA: Diagnosis not present

## 2023-09-07 DIAGNOSIS — D509 Iron deficiency anemia, unspecified: Secondary | ICD-10-CM | POA: Diagnosis not present

## 2023-09-07 DIAGNOSIS — F419 Anxiety disorder, unspecified: Secondary | ICD-10-CM | POA: Diagnosis not present

## 2023-09-07 DIAGNOSIS — Z7689 Persons encountering health services in other specified circumstances: Secondary | ICD-10-CM | POA: Diagnosis not present

## 2023-09-07 DIAGNOSIS — M129 Arthropathy, unspecified: Secondary | ICD-10-CM | POA: Diagnosis not present

## 2023-09-07 NOTE — Progress Notes (Signed)
Joan Joan, Joan Joan ([id number]) [id number] Page 1 of 8 Visit Report for 09/06/2023 Arrival Information Details Patient Name: Date of Service: Joan Joan, [place] RY Joan. 09/06/2023 8:00 Joan M Medical Record Number: [id number] Patient Account Number: [id number] Date of Birth/Sex: Treating RN: 12/09/1965 (57 y.o. F) Primary Care Joan Joan: PCP, [name]: Referring Joan Joan: Treating Joan Joan/Extender: Joan Joan in Treatment: 9 Visit Information History Since Last Visit Added or deleted any medications: No Patient Arrived: Cane Any new allergies or adverse reactions: No Arrival Time: 08:20 Had Joan fall or experienced change in No Accompanied By: self activities of daily living that may affect Transfer Assistance: None risk of falls: Patient Identification Verified: Yes Signs or symptoms of abuse/neglect since last visito No Secondary Verification Process Completed: Yes Hospitalized since last visit: No Patient Has Alerts: Yes Implantable device outside of the clinic excluding No Patient Alerts: Lasix cellular tissue based products placed in the center ABI R Joan Joan (07/02/23) since last visit: ABI L Joan Joan (07/02/23) Pain Present Now: No Electronic Signature(s) Signed: 09/06/2023 1:16:13 PM By: Joan Joan Entered By: Joan Joan on 09/06/2023 08:20:42 -------------------------------------------------------------------------------- Compression Therapy Details Patient Name: Date of Service: Joan Joan, Joan Joan. 09/06/2023 8:00 Joan M Medical Record Number: [id number] Patient Account Number: [id number] Date of Birth/Sex: Treating RN: [birth year] (57 y.o. Joan Joan Primary Care Joan Joan: PCP, [name]: Referring Joan Joan: Treating Joan Joan/Extender: Joan Joan in Treatment: 9 Compression Therapy Performed for Wound Assessment: Wound #6 Left,Lateral Lower Leg Performed By: Clinician Joan Pulling, RN Compression Type: Henriette Combs Post  Procedure Diagnosis Same as Pre-procedure Electronic Signature(s) Signed: 09/06/2023 4:57:37 PM By: Joan Pulling RN, BSN Entered By: Joan Joan on 09/06/2023 08:51:11 Compression Therapy Details -------------------------------------------------------------------------------- Joan Joan ([id number]) [id number] Page 2 of 8 Patient Name: Date of Service: Joan Joan, [place] R[place] Joan. 09/06/2023 8:00 Joan M Medical Record Number: [id number] Patient Account Number: [id number] Date of Birth/Sex: Treating RN: [birth year] (57 y.o. Joan Joan Primary Care Joan Joan: PCP, [name]: Referring Joan Joan: Treating Joan Joan/Extender: Joan Joan in Treatment: 9 Compression Therapy Performed for Wound Assessment: Wound #7 Left,Medial Lower Leg Performed By: Clinician Joan Pulling, RN Compression Type: Henriette Combs Post Procedure Diagnosis Same as Pre-procedure Electronic Signature(s) Signed: 09/06/2023 4:57:37 PM By: Joan Pulling RN, BSN Entered By: Joan Joan on 09/06/2023 08:51:11 -------------------------------------------------------------------------------- Encounter Discharge Information Details Patient Name: Date of Service: Joan Joan, Joan Joan. 09/06/2023 8:00 Joan M Medical Record Number: [id number] Patient Account Number: [id number] Date of Birth/Sex: Treating RN: 1966-09-06 (57 y.o. Joan Joan Primary Care Joan Joan: PCP, [name]: Referring Joan Joan: Treating Joan Joan/Extender: Joan Joan in Treatment: 9 Encounter Discharge Information Items Post Procedure Vitals Discharge Condition: Stable Temperature (F): 97.6 Ambulatory Status: Cane Pulse (bpm): 116 Discharge Destination: Home Respiratory Rate (breaths/min): 18 Transportation: Private Auto Blood Pressure (mmHg): 200/94 Accompanied By: self Schedule Follow-up Appointment: Yes Clinical Summary of Care: Patient Declined Electronic Signature(s) Signed:  09/06/2023 4:57:37 PM By: Joan Pulling RN, BSN Entered By: Joan Joan on 09/06/2023 09:22:42 -------------------------------------------------------------------------------- Lower Extremity Assessment Details Patient Name: Date of Service: Joan Joan, Joan Joan. 09/06/2023 8:00 Joan M Medical Record Number: [id number] Patient Account Number: [id number] Date of Birth/Sex: Treating RN: 1966/06/17 (57 y.o. Joan Joan Primary Care Joan Joan: PCP, [name]: Referring Yaniyah Koors: Treating Janayah Zavada/Extender: Joan Joan in Treatment: 9 Edema Assessment Assessed: [Left: No] [Right: No] Edema: [Left: Ye] [Right: s] Calf Left: Right: Point of Measurement: 32 cm From Medial  Instep 60 cm Joan Joan, Joan Joan ([id number]) [id number] Page 3 of 8 Ankle Left: Right: Point of Measurement: 10 cm From Medial Instep 29.5 cm Vascular Assessment Pulses: Dorsalis Pedis Palpable: [Left:Yes] Extremity colors, hair growth, and conditions: Extremity Color: [Left:Hyperpigmented] Hair Growth on Extremity: [Left:No] Temperature of Extremity: [Left:Warm] Capillary Refill: [Left:< 3 seconds] Dependent Rubor: [Left:No No] Electronic Signature(s) Signed: 09/06/2023 4:57:37 PM By: Joan Pulling RN, BSN Entered By: Joan Joan on 09/06/2023 08:33:22 -------------------------------------------------------------------------------- Multi-Disciplinary Care Plan Details Patient Name: Date of Service: Joan Joan, Joan Joan. 09/06/2023 8:00 Joan M Medical Record Number: [id number] Patient Account Number: [id number] Date of Birth/Sex: Treating RN: January 07, 1966 (57 y.o. Joan Joan Primary Care Junior Huezo: PCP, [name]: Referring Lorilyn Laitinen: Treating Ludmilla Mcgillis/Extender: Joan Joan in Treatment: 9 Multidisciplinary Care Plan reviewed with physician Active Inactive Venous Leg Ulcer Nursing Diagnoses: Actual venous Insuffiency (use after diagnosis is  confirmed) Knowledge deficit related to disease process and management Goals: Patient will maintain optimal edema control Date Initiated: 08/29/2023 Target Resolution Date: 09/26/2023 Goal Status: Active Interventions: Assess peripheral edema status every visit. Compression as ordered Provide education on venous insufficiency Treatment Activities: Therapeutic compression applied : 08/29/2023 Notes: Wound/Skin Impairment Nursing Diagnoses: Impaired tissue integrity Goals: Patient/caregiver will verbalize understanding of skin care regimen Date Initiated: 07/02/2023 Target Resolution Date: 09/25/2023 Goal Status: Active Interventions: KHALEIGH, GOTTFRIED ([id number]) [id number].pdf Page 4 of 8 Assess ulceration(s) every visit Treatment Activities: Skin care regimen initiated : 07/02/2023 Notes: Electronic Signature(s) Signed: 09/06/2023 4:57:37 PM By: Joan Pulling RN, BSN Entered By: Joan Joan on 09/06/2023 08:38:35 -------------------------------------------------------------------------------- Pain Assessment Details Patient Name: Date of Service: Joan Joan, Joan Joan. 09/06/2023 8:00 Joan M Medical Record Number: [id number] Patient Account Number: [id number] Date of Birth/Sex: Treating RN: 1966-08-18 (57 y.o. F) Primary Care Selinda Korzeniewski: PCP, [name]: Referring Marisah Laker: Treating Tedric Leeth/Extender: Joan Joan in Treatment: 9 Active Problems Location of Pain Severity and Description of Pain Patient Has Paino No Site Locations Pain Management and Medication Current Pain Management: Electronic Signature(s) Signed: 09/06/2023 1:16:13 PM By: Joan Joan Entered By: Joan Joan on 09/06/2023 08:21:36 -------------------------------------------------------------------------------- Patient/Caregiver Education Details Patient Name: Date of Service: Joan Joan, Joan Joan. 12/12/2024andnbsp8:00 Joan M Medical Record Number: [id number] Patient Account  Number: [id number] Date of Birth/Gender: Treating RN: 1965-12-12 (57 y.o. Joan Joan Primary Care Physician: PCP, [name]: Referring Physician: Treating Physician/Extender: Joan Joan in Treatment: [address] [place] Joan ([id number]) [id number] Page 5 of 8 Education Assessment Education Provided To: Patient Education Topics Provided Wound/Skin Impairment: Methods: Explain/Verbal Responses: State content correctly Electronic Signature(s) Signed: 09/06/2023 4:57:37 PM By: Joan Pulling RN, BSN Entered By: Joan Joan on 09/06/2023 08:40:24 -------------------------------------------------------------------------------- Wound Assessment Details Patient Name: Date of Service: Joan Joan, Joan Joan. 09/06/2023 8:00 Joan M Medical Record Number: [id number] Patient Account Number: [id number] Date of Birth/Sex: Treating RN: [birth year] (57 y.o. F) Primary Care Talishia Betzler: PCP, [name]: Referring Markos Theil: Treating Selma Mink/Extender: Joan Joan in Treatment: 9 Wound Status Wound Number: 6 Primary Lymphedema Etiology: Wound Location: Left, Lateral Lower Leg Wound Open Wounding Event: Gradually Appeared Status: Date Acquired: 08/09/2020 Comorbid Anemia, Asthma, Congestive Heart Failure, Deep Vein Weeks Of Treatment: 9 History: Thrombosis, Hypertension, Osteoarthritis Clustered Wound: No Photos Wound Measurements Length: (cm) 1.2 Width: (cm) 0.9 Depth: (cm) 0.2 Area: (cm) 0.848 Volume: (cm) 0.17 % Reduction in Area: 76.5% % Reduction in Volume: 84.3% Epithelialization: Small (1-33%) Tunneling: No Undermining: No Wound Description Classification: Full Thickness Without Exposed Support  Structures Wound Margin: Distinct, outline attached Exudate Amount: Medium Exudate Type: Serosanguineous Exudate Color: red, brown Foul Odor After Cleansing: No Slough/Fibrino Yes Wound Bed Granulation Amount: Large (67-100%)  Exposed Joan Joan, Joan Joan ([id number]) [id number] Page 6 of 8 Granulation Quality: Pink Fascia Exposed: No Necrotic Amount: Small (1-33%) Fat Layer (Subcutaneous Tissue) Exposed: Yes Necrotic Quality: Adherent Slough Tendon Exposed: No Muscle Exposed: No Joint Exposed: No Bone Exposed: No Periwound Skin Texture Texture Color No Abnormalities Noted: Yes No Abnormalities Noted: No Hemosiderin Staining: Yes Moisture No Abnormalities Noted: Yes Temperature / Pain Temperature: No Abnormality Treatment Notes Wound #6 (Lower Leg) Wound Laterality: Left, Lateral Cleanser Soap and Water Discharge Instruction: At Wound center-May shower and wash wound with dial antibacterial soap and water prior to dressing change. Peri-Wound Care Triamcinolone 15 (g) Discharge Instruction: Use triamcinolone 15 (g) as directed Sween Lotion (Moisturizing lotion) Discharge Instruction: Apply moisturizing lotion as directed Topical Gentamicin Discharge Instruction: As directed by physician Mupirocin Ointment Discharge Instruction: Apply Mupirocin (Bactroban) as instructed Primary Dressing ADAPTIC TOUCH 3x4.25 (in/in) Discharge Instruction: Apply to wound bed as instructed Hydrofera Blue Ready Transfer Foam, 4x5 (in/in) Discharge Instruction: Apply to wound bed as instructed Secondary Dressing Woven Gauze Sponge, Non-Sterile 4x4 in Discharge Instruction: Apply over primary dressing as directed. Secured With Compression Wrap Unnaboot w/Calamine, 4x10 (in/yd) Discharge Instruction: Apply Unnaboot as directed. Compression Stockings Jobst Farrow Wrap 4000 Quantity: 1 Left Leg Compression Amount: 30-40 mmHg Discharge Instruction: Apply Renee Pain daily as instructed. Apply first thing in the morning, remove at night before bed. Add-Ons Electronic Signature(s) Signed: 09/06/2023 4:57:37 PM By: Joan Pulling RN, BSN Entered By: Joan Joan on 09/06/2023  08:33:36 -------------------------------------------------------------------------------- Wound Assessment Details Patient Name: Date of Service: Joan Joan, Joan Joan. 09/06/2023 8:00 Joan Joan, Joan Joan ([id number]) [id number] Page 7 of 8 Medical Record Number: [id number] Patient Account Number: [id number] Date of Birth/Sex: Treating RN: [birth year] (57 y.o. Joan Joan Primary Care Paighton Godette: PCP, [name]: Referring Elvera Almario: Treating Tomeeka Plaugher/Extender: Joan Joan in Treatment: 9 Wound Status Wound Number: 7 Primary Venous Leg Ulcer Etiology: Wound Location: Left, Medial Lower Leg Wound Open Wounding Event: Gradually Appeared Status: Date Acquired: 09/06/2023 Comorbid Anemia, Asthma, Congestive Heart Failure, Deep Vein Weeks Of Treatment: 0 History: Thrombosis, Hypertension, Osteoarthritis Clustered Wound: No Photos Wound Measurements Length: (cm) 1 Width: (cm) 0.5 Depth: (cm) 0.1 Area: (cm) 0.393 Volume: (cm) 0.039 % Reduction in Area: % Reduction in Volume: Epithelialization: None Tunneling: No Undermining: No Wound Description Classification: Full Thickness Without Exposed Support Structures Exudate Amount: Medium Exudate Type: Serosanguineous Exudate Color: red, brown Foul Odor After Cleansing: No Slough/Fibrino Yes Wound Bed Granulation Amount: Large (67-100%) Exposed Structure Granulation Quality: Red Fascia Exposed: No Necrotic Amount: Small (1-33%) Fat Layer (Subcutaneous Tissue) Exposed: Yes Necrotic Quality: Adherent Slough Tendon Exposed: No Muscle Exposed: No Joint Exposed: No Bone Exposed: No Periwound Skin Texture Texture Color No Abnormalities Noted: No No Abnormalities Noted: No Moisture No Abnormalities Noted: No Treatment Notes Wound #7 (Lower Leg) Wound Laterality: Left, Medial Cleanser Soap and Water Discharge Instruction: May shower and wash wound with dial antibacterial soap and  water prior to dressing change. Vashe 5.8 (oz) Discharge Instruction: Cleanse the wound with Vashe prior to applying Joan clean dressing using gauze sponges, not tissue or cotton balls. Peri-Wound Care Sween Lotion (Moisturizing lotion) Discharge Instruction: Apply moisturizing lotion as directed Topical Primary Dressing Joan Joan, Joan Joan ([id number]) [id number] Page 8 of 8 Hydrofera Blue Ready Transfer Foam, 2.5x2.5 (in/in) Discharge  Instruction: Apply directly to wound bed as directed Secondary Dressing ABD Pad, 5x9 Discharge Instruction: Apply over primary dressing as directed. Secured With Compression Wrap Unnaboot w/Calamine, 4x10 (in/yd) Discharge Instruction: Apply Unnaboot as directed. Compression Stockings Add-Ons Electronic Signature(s) Signed: 09/06/2023 4:57:37 PM By: Joan Pulling RN, BSN Entered By: Joan Joan on 09/06/2023 08:33:41 -------------------------------------------------------------------------------- Vitals Details Patient Name: Date of Service: Joan Joan, Joan Joan. 09/06/2023 8:00 Joan M Medical Record Number: [id number] Patient Account Number: [id number] Date of Birth/Sex: Treating RN: 05-30-1966 (57 y.o. F) Primary Care Michaeljoseph Revolorio: PCP, [name]: Referring Lotus Santillo: Treating Jovi Zavadil/Extender: Joan Joan in Treatment: 9 Vital Signs Time Taken: 08:20 Temperature (F): 97.8 Height (in): 71 Pulse (bpm): 116 Weight (lbs): 352 Respiratory Rate (breaths/min): 18 Body Mass Index (BMI): 49.1 Blood Pressure (mmHg): 200/94 Reference Range: 80 - 120 mg / dl Electronic Signature(s) Signed: 09/06/2023 4:57:37 PM By: Joan Pulling RN, BSN Entered By: Joan Joan on 09/06/2023 09:21:01

## 2023-09-07 NOTE — Progress Notes (Signed)
Joan, [name] ([id number]) [id number] Page 1 of 7 Visit Report for 09/06/2023 Cellular or Tissue Based Product Details Patient Name: Date of Service: Joan [name], [place] RY [name]. 09/06/2023 8:00 [name] Medical Record Number: [id number] Patient Account Number: [id number] Date of Birth/Sex: Treating RN: 12-06-1965 (57 y.o. Joan [name] Primary Care Provider: PCP, [name]: Referring Provider: Treating Provider/Extender: Joan [name] in Treatment: 9 Cellular or Tissue Based Product Type Wound #6 Left,Lateral Lower Leg Applied to: Performed By: Physician Joan [name]., MD The following information was scribed by: Joan [name] The information was scribed for: Joan [name] Cellular or Tissue Based Product Type: Epifix Level of Consciousness (Pre-procedure): Awake and Alert Pre-procedure Verification/Time Out Yes - 09:45 Taken: Location: trunk / arms / legs Wound Size (sq cm): 1.08 Product Size (sq cm): 6 Waste Size (sq cm): 0 Amount of Product Applied (sq cm): 6 Instrument Used: Forceps Lot #: [id number] Expiration Date: 10/27/2027 Fenestrated: No Reconstituted: Yes Solution Type: normal saline Solution Amount: 3mL Lot #: [id number] KS Solution Expiration Date: 01/13/2025 Secured: Yes Secured With: Steri-Strips Dressing Applied: Yes Primary Dressing: hydrofera blue, adaptic, gauze Procedural Pain: 0 Post Procedural Pain: 0 Response to Treatment: Procedure was tolerated well Level of Consciousness (Post- Awake and Alert procedure): Post Procedure Diagnosis Same as Pre-procedure Electronic Signature(s) Signed: 09/06/2023 4:57:37 PM By: Joan Pulling RN, BSN Signed: 09/06/2023 5:17:16 PM By: Joan Najjar MD Entered By: Joan [name] on 09/06/2023 08:49:52 -------------------------------------------------------------------------------- HPI Details Patient Name: Date of Service: Joan Officer, [place] RY [name]. 09/06/2023 8:00 [name]  M Medical Record Number: [id number] Patient Account Number: [id number] Date of Birth/Sex: Treating RN: Feb 06, 1966 (57 y.o. F) Primary Care Provider: PCP, [name]: Referring Provider: Treating Provider/Extender: Joan [name] in Treatment: [address] [place] [name] ([id number]) [id number] Page 2 of 7 History of Present Illness HPI Description: ADMISSION 07/02/2023 ***ABIs non-compressible bilaterally; triphasic Doppler signals*** This is [name] 57 year old morbidly obese patient who is not [name] diabetic. She does have hypertension and CHF. Although she does not carry this diagnosis in her electronic medical record, she clearly has lymphedema. She has multiple wounds on her bilateral lower extremities, 1 of which has been present for 3 years. She has been treating her wounds with peroxide and bacitracin. Her provider has given her various courses of antibiotics, but the wounds have not closed. She was told not to wear compression stockings due to her history of DVT but this was well over [name] year ago. She has never worn lymphedema pumps. , 07/09/2023: Several of the smaller wounds have closed. Remaining wounds include the left proximal medial ulcer and the left lateral distal ulcer, as well as the paired wounds on her right lateral lower leg. There is slough accumulation at all sites. 07/16/2023: The left distal lateral leg ulcer is [name] little bit smaller and quite [name] bit cleaner. The more proximal medial left lower leg ulcer still has [name] fair amount of nonviable tissue present and this more tender. Her wrap did slide down and it appears that it came to rest right in the middle of this wound causing some pressure on the site. The paired right lateral lower leg ulcers are small and superficial with [name] little bit of slough and eschar present. 07/23/2023: The right lateral lower leg ulcers are nearly closed but remain just barely open underneath [name] layer of eschar. The proximal  medial left lower leg ulcer has less nonviable tissue present today and is smaller. The left distal lateral leg ulcer is  unchanged in size, but the surface is clean and the quality of the tissue is improving. 07/30/2023: Both right leg ulcers are healed. The proximal medial left leg ulcer is smaller and more superficial. The left distal lateral leg ulcer is still unchanged in size. There is more granulation tissue filling in, but the surface is still fairly fibrotic. Edema control is adequate. 08/07/2023: She unfortunately had to travel out of town urgently to go to [place] this past week and as [name] result, was on her feet and with her legs in [name] dependent position quite [name] bit. This has resulted in the medial leg ulcer getting [name] little bit larger and the lateral leg ulcer getting [name] little bit deeper. Her juxta lite stockings were delivered and she has them with her today. She has been approved for Epifix so we will proceed with application today. 08/13/2023: The medial leg ulcer is quite [name] bit smaller this week and the lateral leg ulcer also measured slightly smaller but the tissue surface is healthier- looking. She has been on her feet again [name] lot this past week so her edema control remains poor. 08/20/2023: The medial leg ulcer is nearly healed with just [name] tiny open area remaining underneath [name] layer of eschar. The lateral leg ulcer is unchanged and the surface remains [name] bit fibrotic. Edema control is better this week. 12/3; the medial ulcer is healed. On the left lateral we still have [name] punched-out area with roughly 0.4 cm in depth. The granulation tissue at the base of this looks healthy. EpiFix reapplied in the standard fashion. 12/12; the patient's medial left leg ulcer was healed however her Unna boot fell down this week and she has [name] new wound on the upper medial lower leg in the setting of uncontrolled swelling. The wound that we she had last week where we are putting epi fix is about the same  but the swelling is just too much. She did not come in with any stocking on the right leg Electronic Signature(s) Signed: 09/06/2023 5:17:16 PM By: Joan Najjar MD Entered By: Joan [name] on 09/06/2023 09:08:05 -------------------------------------------------------------------------------- Physical Exam Details Patient Name: Date of Service: Joan Officer, [place] R[place] [name]. 09/06/2023 8:00 [name] Medical Record Number: [id number] Patient Account Number: [id number] Date of Birth/Sex: Treating RN: Aug 14, 1966 (57 y.o. F) Primary Care Provider: PCP, [name]: Referring Provider: Treating Provider/Extender: Joan [name] in Treatment: 9 Constitutional Heart rate Slightly tachycardic. Temperature is normal and within the target range for the patient.. Cardiovascular Very poor edema control in the left leg nonpitting. Above the wrap there is [name] wide area of increased edema above where the Unna boot fell down. The wound is on the medial part of this area superficial. There is poor edema control in the left leg even around the wound on the left lateral leg.. Notes Wound exam; the medial left leg wound had healed last week but she has [name] new area on the medial left upper leg caused by wrap injury with the Unna boot falling down. Her area on the lateral left leg is about the same still about 0.4 0.5 cm in depth however with clean granulation tissue. The edema control here is not adequate. On the right leg she did not come in with any compression stockings. Pedal pulses are palpable on the left Electronic Signature(s) Joan [name], Joan [name] ([id number]) [id number] Page 3 of 7 Signed: 09/06/2023 5:17:16 PM By: Joan Najjar MD Entered By: Joan [name] on 09/06/2023 09:11:38 -------------------------------------------------------------------------------- Physician  Orders Details Patient Name: Date of Service: [name], [place] R[place] [name]. 09/06/2023 8:00 [name] Medical Record  Number: [id number] Patient Account Number: [id number] Date of Birth/Sex: Treating RN: 1966-05-07 (57 y.o. Joan [name] Primary Care Provider: PCP, [name]: Referring Provider: Treating Provider/Extender: Joan [name] in Treatment: 9 Verbal / Phone Orders: No Diagnosis Coding Follow-up Appointments ppointment in 1 week. - Dr. Leanord Hawking Return [name] Anesthetic (In clinic) Topical Lidocaine 4% applied to wound bed Cellular or Tissue Based Products Wound #6 Left,Lateral Lower Leg Cellular or Tissue Based Product Type: - Epifix #4 Cellular or Tissue Based Product applied to wound bed, secured with steri-strips, cover with Adaptic or Mepitel. (DO NOT REMOVE). Bathing/ Shower/ Hygiene May shower with protection but do not get wound dressing(s) wet. Protect dressing(s) with water repellant cover (for example, large plastic bag) or [name] cast cover and may then take shower. - Keep legs dry until next appointment. Use Cast Protectors if so desired. Other Bathing/Shower/Hygiene Orders/Instructions: - May want to sponge bath if legs cannot be kept dry until next appointment. Additional Orders / Instructions Follow Nutritious Diet - Try and increase Protein intake. The goal is 70g-100g per day. Examples are chicken, fish, meat, pork, Greek yogurt, eggs etc. Lymphedema Treatment Plan - Exercise, Compression and Elevation Exercise daily as tolerated. (Walking, ROM, Calf Pumps and Toe Taps) Compression Wraps as ordered Elevate legs 30 - 60 minutes at or above heart level at least 3 - 4 times daily as able/tolerated Avoid standing for long periods and elevate leg(s) parallel to the floor when sitting Wound Treatment Wound #6 - Lower Leg Wound Laterality: Left, Lateral Cleanser: Soap and Water 1 x Per Week/30 Days Discharge Instructions: At Wound center-May shower and wash wound with dial antibacterial soap and water prior to dressing change. Peri-Wound Care: Triamcinolone 15 (g) 1 x Per  Week/30 Days Discharge Instructions: Use triamcinolone 15 (g) as directed Peri-Wound Care: Sween Lotion (Moisturizing lotion) 1 x Per Week/30 Days Discharge Instructions: Apply moisturizing lotion as directed Topical: Gentamicin 1 x Per Week/30 Days Discharge Instructions: As directed by physician Topical: Mupirocin Ointment 1 x Per Week/30 Days Discharge Instructions: Apply Mupirocin (Bactroban) as instructed Prim Dressing: ADAPTIC TOUCH 3x4.25 (in/in) 1 x Per Week/30 Days ary Discharge Instructions: Apply to wound bed as instructed Prim Dressing: Hydrofera Blue Ready Transfer Foam, 4x5 (in/in) 1 x Per Week/30 Days ary Discharge Instructions: Apply to wound bed as instructed Secondary Dressing: Woven Gauze Sponge, Non-Sterile 4x4 in 1 x Per Week/30 Days Discharge Instructions: Apply over primary dressing as directed. Compression Wrap: Unnaboot w/Calamine, 4x10 (in/yd) 1 x Per Week/30 Days Discharge Instructions: Apply Unnaboot as directed. Joan [name], Joan [name] ([id number]) [id number] Page 4 of 7 Compression Stockings: Jobst Farrow Wrap 4000 Left Leg Compression Amount: 30-40 mmHG Discharge Instructions: Apply Renee Pain daily as instructed. Apply first thing in the morning, remove at night before bed. Wound #7 - Lower Leg Wound Laterality: Left, Medial Cleanser: Soap and Water 1 x Per Week/15 Days Discharge Instructions: May shower and wash wound with dial antibacterial soap and water prior to dressing change. Cleanser: Vashe 5.8 (oz) 1 x Per Week/15 Days Discharge Instructions: Cleanse the wound with Vashe prior to applying [name] clean dressing using gauze sponges, not tissue or cotton balls. Peri-Wound Care: Sween Lotion (Moisturizing lotion) 1 x Per Week/15 Days Discharge Instructions: Apply moisturizing lotion as directed Prim Dressing: Hydrofera Blue Ready Transfer Foam, 2.5x2.5 (in/in) 1 x Per Week/15 Days ary Discharge Instructions: Apply directly to  wound  bed as directed Secondary Dressing: ABD Pad, 5x9 1 x Per Week/15 Days Discharge Instructions: Apply over primary dressing as directed. Compression Wrap: Unnaboot w/Calamine, 4x10 (in/yd) 1 x Per Week/15 Days Discharge Instructions: Apply Unnaboot as directed. Electronic Signature(s) Signed: 09/06/2023 4:57:37 PM By: Joan Pulling RN, BSN Signed: 09/06/2023 5:17:16 PM By: Joan Najjar MD Entered By: Joan [name] on 09/06/2023 09:06:58 -------------------------------------------------------------------------------- Progress Note Details Patient Name: Date of Service: Joan Officer, [place] R[place] [name]. 09/06/2023 8:00 [name] Medical Record Number: [id number] Patient Account Number: [id number] Date of Birth/Sex: Treating RN: 1966/08/08 (57 y.o. F) Primary Care Provider: PCP, [name]: Referring Provider: Treating Provider/Extender: Joan [name] in Treatment: 9 Subjective History of Present Illness (HPI) ADMISSION 07/02/2023 ***ABIs non-compressible bilaterally; triphasic Doppler signals*** This is [name] 57 year old morbidly obese patient who is not [name] diabetic. She does have hypertension and CHF. Although she does not carry this diagnosis in her electronic medical record, she clearly has lymphedema. She has multiple wounds on her bilateral lower extremities, 1 of which has been present for 3 years. She has been treating her wounds with peroxide and bacitracin. Her provider has given her various courses of antibiotics, but the wounds have not closed. She was told not to wear compression stockings due to her history of DVT but this was well over [name] year ago. She has never worn lymphedema pumps. , 07/09/2023: Several of the smaller wounds have closed. Remaining wounds include the left proximal medial ulcer and the left lateral distal ulcer, as well as the paired wounds on her right lateral lower leg. There is slough accumulation at all sites. 07/16/2023: The left distal lateral leg ulcer is [name]  little bit smaller and quite [name] bit cleaner. The more proximal medial left lower leg ulcer still has [name] fair amount of nonviable tissue present and this more tender. Her wrap did slide down and it appears that it came to rest right in the middle of this wound causing some pressure on the site. The paired right lateral lower leg ulcers are small and superficial with [name] little bit of slough and eschar present. 07/23/2023: The right lateral lower leg ulcers are nearly closed but remain just barely open underneath [name] layer of eschar. The proximal medial left lower leg ulcer has less nonviable tissue present today and is smaller. The left distal lateral leg ulcer is unchanged in size, but the surface is clean and the quality of the tissue is improving. 07/30/2023: Both right leg ulcers are healed. The proximal medial left leg ulcer is smaller and more superficial. The left distal lateral leg ulcer is still unchanged in size. There is more granulation tissue filling in, but the surface is still fairly fibrotic. Edema control is adequate. 08/07/2023: She unfortunately had to travel out of town urgently to go to [place] this past week and as [name] result, was on her feet and with her legs in [name] dependent position quite [name] bit. This has resulted in the medial leg ulcer getting [name] little bit larger and the lateral leg ulcer getting [name] little bit deeper. Her juxta lite stockings were delivered and she has them with her today. She has been approved for Epifix so we will proceed with application today. 08/13/2023: The medial leg ulcer is quite [name] bit smaller this week and the lateral leg ulcer also measured slightly smaller but the tissue surface is healthier- looking. She has been on her feet again [name] lot this past week so her edema  control remains poor. Joan [name], Joan [name] ([id number]) [id number] Page 5 of 7 08/20/2023: The medial leg ulcer is nearly healed with just [name] tiny open area remaining  underneath [name] layer of eschar. The lateral leg ulcer is unchanged and the surface remains [name] bit fibrotic. Edema control is better this week. 12/3; the medial ulcer is healed. On the left lateral we still have [name] punched-out area with roughly 0.4 cm in depth. The granulation tissue at the base of this looks healthy. EpiFix reapplied in the standard fashion. 12/12; the patient's medial left leg ulcer was healed however her Unna boot fell down this week and she has [name] new wound on the upper medial lower leg in the setting of uncontrolled swelling. The wound that we she had last week where we are putting epi fix is about the same but the swelling is just too much. She did not come in with any stocking on the right leg Objective Constitutional Heart rate Slightly tachycardic. Temperature is normal and within the target range for the patient.. Vitals Time Taken: 8:20 AM, Height: 71 in, Weight: 352 lbs, BMI: 49.1, Temperature: 97.8 F, Pulse: 116 bpm, Respiratory Rate: 18 breaths/min. Cardiovascular Very poor edema control in the left leg nonpitting. Above the wrap there is [name] wide area of increased edema above where the Unna boot fell down. The wound is on the medial part of this area superficial. There is poor edema control in the left leg even around the wound on the left lateral leg.. General Notes: Wound exam; the medial left leg wound had healed last week but she has [name] new area on the medial left upper leg caused by wrap injury with the Unna boot falling down. Her area on the lateral left leg is about the same still about 0.4 0.5 cm in depth however with clean granulation tissue. The edema control here is not adequate. On the right leg she did not come in with any compression stockings. Pedal pulses are palpable on the left Integumentary (Hair, Skin) Wound #6 status is Open. Original cause of wound was Gradually Appeared. The date acquired was: 08/09/2020. The wound has been in treatment 9 weeks.  The wound is located on the Left,Lateral Lower Leg. The wound measures 1.2cm length x 0.9cm width x 0.2cm depth; 0.848cm^2 area and 0.17cm^3 volume. There is Fat Layer (Subcutaneous Tissue) exposed. There is no tunneling or undermining noted. There is [name] medium amount of serosanguineous drainage noted. The wound margin is distinct with the outline attached to the wound base. There is large (67-100%) pink granulation within the wound bed. There is [name] small (1-33%) amount of necrotic tissue within the wound bed including Adherent Slough. The periwound skin appearance had no abnormalities noted for texture. The periwound skin appearance had no abnormalities noted for moisture. The periwound skin appearance exhibited: Hemosiderin Staining. Periwound temperature was noted as No Abnormality. Wound #7 status is Open. Original cause of wound was Gradually Appeared. The date acquired was: 09/06/2023. The wound is located on the Left,Medial Lower Leg. The wound measures 1cm length x 0.5cm width x 0.1cm depth; 0.393cm^2 area and 0.039cm^3 volume. There is Fat Layer (Subcutaneous Tissue) exposed. There is no tunneling or undermining noted. There is [name] medium amount of serosanguineous drainage noted. There is large (67-100%) red granulation within the wound bed. There is [name] small (1-33%) amount of necrotic tissue within the wound bed including Adherent Slough. Procedures Wound #6 Pre-procedure diagnosis of Wound #6 is [name] Lymphedema located on the Left,Lateral  Lower Leg. [name] skin graft procedure using [name] bioengineered skin substitute/cellular or tissue based product was performed by Joan [name]., MD with the following instrument(s): Forceps. Epifix was applied and secured with Steri-Strips. 6 sq cm of product was utilized and 0 sq cm was wasted. Post Application, hydrofera blue, adaptic, gauze was applied. [name] Time Out was conducted at 09:45, prior to the start of the procedure. The procedure was tolerated well with [name]  pain level of 0 throughout and [name] pain level of 0 following the procedure. Post procedure Diagnosis Wound #6: Same as Pre-Procedure . Pre-procedure diagnosis of Wound #6 is [name] Lymphedema located on the Left,Lateral Lower Leg . There was [name] Radio broadcast assistant Compression Therapy Procedure by Joan Pulling, RN. 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 There was [name] Radio broadcast assistant Compression Therapy Procedure by Joan Pulling, RN. Post procedure Diagnosis Wound #7: Same as Pre-Procedure Plan Follow-up Appointments: Return Appointment in 1 week. - Dr. Leanord Hawking Anesthetic: (In clinic) Topical Lidocaine 4% applied to wound bed Cellular or Tissue Based Products: Wound #6 Left,Lateral Lower Leg: Cellular or Tissue Based Product Type: - Epifix #4 Joan [name], Joan [name] ([id number]) [id number] Page 6 of 7 Cellular or Tissue Based Product applied to wound bed, secured with steri-strips, cover with Adaptic or Mepitel. (DO NOT REMOVE). Bathing/ Shower/ Hygiene: May shower with protection but do not get wound dressing(s) wet. Protect dressing(s) with water repellant cover (for example, large plastic bag) or [name] cast cover and may then take shower. - Keep legs dry until next appointment. Use Cast Protectors if so desired. Other Bathing/Shower/Hygiene Orders/Instructions: - May want to sponge bath if legs cannot be kept dry until next appointment. Additional Orders / Instructions: Follow Nutritious Diet - Try and increase Protein intake. The goal is 70g-100g per day. Examples are chicken, fish, meat, pork, Greek yogurt, eggs etc. Lymphedema Treatment Plan - Exercise, Compression and Elevation: Exercise daily as tolerated. (Walking, ROM, Calf Pumps and T T oe aps) Compression Wraps as ordered Elevate legs 30 - 60 minutes at or above heart level at least 3 - 4 times daily as  able/tolerated Avoid standing for long periods and elevate leg(s) parallel to the floor when sitting WOUND #6: - Lower Leg Wound Laterality: Left, Lateral Cleanser: Soap and Water 1 x Per Week/30 Days Discharge Instructions: At Wound center-May shower and wash wound with dial antibacterial soap and water prior to dressing change. Peri-Wound Care: Triamcinolone 15 (g) 1 x Per Week/30 Days Discharge Instructions: Use triamcinolone 15 (g) as directed Peri-Wound Care: Sween Lotion (Moisturizing lotion) 1 x Per Week/30 Days Discharge Instructions: Apply moisturizing lotion as directed Topical: Gentamicin 1 x Per Week/30 Days Discharge Instructions: As directed by physician Topical: Mupirocin Ointment 1 x Per Week/30 Days Discharge Instructions: Apply Mupirocin (Bactroban) as instructed Prim Dressing: ADAPTIC TOUCH 3x4.25 (in/in) 1 x Per Week/30 Days ary Discharge Instructions: Apply to wound bed as instructed Prim Dressing: Hydrofera Blue Ready Transfer Foam, 4x5 (in/in) 1 x Per Week/30 Days ary Discharge Instructions: Apply to wound bed as instructed Secondary Dressing: Woven Gauze Sponge, Non-Sterile 4x4 in 1 x Per Week/30 Days Discharge Instructions: Apply over primary dressing as directed. Com pression Wrap: Unnaboot w/Calamine, 4x10 (in/yd) 1 x Per Week/30 Days Discharge Instructions: Apply Unnaboot as directed. Com pression Stockings: Jobst Farrow Wrap 4000 Compression Amount: 30-40 mmHg (left) Discharge Instructions: Apply Renee Pain daily as instructed.  Apply first thing in the morning, remove at night before bed. WOUND #7: - Lower Leg Wound Laterality: Left, Medial Cleanser: Soap and Water 1 x Per Week/15 Days Discharge Instructions: May shower and wash wound with dial antibacterial soap and water prior to dressing change. Cleanser: Vashe 5.8 (oz) 1 x Per Week/15 Days Discharge Instructions: Cleanse the wound with Vashe prior to applying [name] clean dressing using gauze sponges, not  tissue or cotton balls. Peri-Wound Care: Sween Lotion (Moisturizing lotion) 1 x Per Week/15 Days Discharge Instructions: Apply moisturizing lotion as directed Prim Dressing: Hydrofera Blue Ready Transfer Foam, 2.5x2.5 (in/in) 1 x Per Week/15 Days ary Discharge Instructions: Apply directly to wound bed as directed Secondary Dressing: ABD Pad, 5x9 1 x Per Week/15 Days Discharge Instructions: Apply over primary dressing as directed. Com pression Wrap: Unnaboot w/Calamine, 4x10 (in/yd) 1 x Per Week/15 Days Discharge Instructions: Apply Unnaboot as directed. 1. Poorly controlled lymphedema today. I replaced the EpiFix but if this is not better next week on the left lateral lower leg I will suspend its use 2. New wound on the left medial upper leg caused by wrap injury. I have asked her to call us if this wrap falls down again. 3. I had some thoughts about putting her in 4-layer compression on the left however her ABIs are noncompressible. Dr. America Brown note notes triphasic waveforms I am assuming that was auscultated in our clinic. She has not had formal arterial studies that I can see. 4. She comes in with no stocking on the right leg. I counseled her about this 5 EpiFix #5 however if we do not have adequate edema control next week I am going to discontinue this Electronic Signature(s) Signed: 09/06/2023 5:17:16 PM By: Joan Najjar MD Entered By: Joan [name] on 09/06/2023 09:14:27 -------------------------------------------------------------------------------- SuperBill Details Patient Name: Date of Service: Joan Officer, MA RY [name]. 09/06/2023 Medical Record Number: [id number] Patient Account Number: [id number] Date of Birth/Sex: Treating RN: 1965/10/14 (57 y.o. F) Primary Care Provider: PCP, [name]: Referring Provider: Treating Provider/Extender: Joan [name] in Treatment: 9 Diagnosis Coding ICD-10 Codes Joan [name], Joan [name] ([id number])  [id number] Page 7 of 7 Code Description [phone] 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osed I87.319 Chronic venous hypertension (idiopathic) with ulcer of unspecified lower extremity I89.0 Lymphedema, not elsewhere classified I50.32 Chronic diastolic (congestive) heart failure E66.01 Morbid (severe) obesity due to excess calories Facility Procedures : CPT4 Code: 06301601 Description: Q4186 Epifix 2cm x 3cm Modifier: Quantity: 6 : CPT4 Code: 09323557 Description: 15271 - SKIN SUB GRAFT TRNK/ARM/LEG ICD-10 Diagnosis Description L97.822 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os Modifier: ed Quantity: 1 Physician Procedures : CPT4 Code Description Modifier [id number] 15271 - WC PHYS SKIN SUB GRAFT TRNK/ARM/LEG ICD-10 Diagnosis Description L97.822 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osed Quantity: 1 Electronic Signature(s) Signed: 09/06/2023 5:17:16 PM By: Joan Najjar MD Entered By: Joan [name] on 09/06/2023 27:06:23

## 2023-09-14 ENCOUNTER — Encounter (HOSPITAL_BASED_OUTPATIENT_CLINIC_OR_DEPARTMENT_OTHER): Payer: Medicare HMO | Admitting: General Surgery

## 2023-09-14 DIAGNOSIS — I87319 Chronic venous hypertension (idiopathic) with ulcer of unspecified lower extremity: Secondary | ICD-10-CM | POA: Diagnosis not present

## 2023-09-15 NOTE — Progress Notes (Signed)
[name], GIRDLER Joan ([id number]) [id number] Page 1 of 9 Visit Report for 09/14/2023 Arrival Information Details Patient Name: Date of Service: [name], [place] RY Joan. 09/14/2023 8:00 Joan M Medical Record Number: [id number] Patient Account Number: [id number] Date of Birth/Sex: Treating RN: 1966-02-22 (57 y.o. [name] Blazing Primary Care Devani Odonnel: PCP, [name]: Referring Katlyn Muldrew: Treating Jayion Schneck/Extender: Sherryl Manges in Treatment: 10 Visit Information History Since Last Visit Added or deleted any medications: No Patient Arrived: Ambulatory Any new allergies or adverse reactions: No Arrival Time: 08:29 Had Joan fall or experienced change in No Accompanied By: self activities of daily living that may affect Transfer Assistance: None risk of falls: Patient Identification Verified: Yes Signs or symptoms of abuse/neglect since last visito No Patient Has Alerts: Yes Hospitalized since last visit: No Patient Alerts: Lasix Implantable device outside of the clinic excluding No ABI R [place] (07/02/23) cellular tissue based products placed in the center ABI L [place] (07/02/23) since last visit: Has Dressing in Place as Prescribed: Yes Has Compression in Place as Prescribed: Yes Pain Present Now: No Electronic Signature(s) Signed: 09/14/2023 10:02:27 AM By: Karie Schwalbe RN Entered By: Karie Schwalbe on 09/14/2023 05:31:56 -------------------------------------------------------------------------------- Compression Therapy Details Patient Name: Date of Service: [name] Officer, [name] 09/14/2023 8:00 Joan M Medical Record Number: [id number] Patient Account Number: [id number] Date of Birth/Sex: Treating RN: [birth year] (57 y.o. [name] Primary Care Navjot Loera: PCP, [name]: Referring Christo Hain: Treating Anoop Hemmer/Extender: Sherryl Manges in Treatment: 10 Compression Therapy Performed for Wound Assessment: Wound #6 Left,Lateral Lower  Leg Performed By: Clinician Zenaida Deed, RN Compression Type: Henriette Combs Post Procedure Diagnosis Same as Pre-procedure Electronic Signature(s) Signed: 09/14/2023 12:52:17 PM By: Zenaida Deed RN, BSN Entered By: Zenaida Deed on 09/14/2023 05:48:49 [name] ([id number]) [id number] Page 2 of 9 -------------------------------------------------------------------------------- Encounter Discharge Information Details Patient Name: Date of Service: [name], [place] RY Joan. 09/14/2023 8:00 Joan M Medical Record Number: [id number] Patient Account Number: [id number] Date of Birth/Sex: Treating RN: 1966/09/13 (57 y.o. [name] Blazing Primary Care [name]: PCP, [name]: Referring Brieanna Nau: Treating Azyiah [name]/Extender: Sherryl Manges in Treatment: 10 Encounter Discharge Information Items Post Procedure Vitals Discharge Condition: Stable Temperature (F): 97.3 Ambulatory Status: Ambulatory Pulse (bpm): 112 Discharge Destination: Home Respiratory Rate (breaths/min): 18 Transportation: Private Auto Blood Pressure (mmHg): 198/100 Accompanied By: self Schedule Follow-up Appointment: Yes Clinical Summary of Care: Patient Declined Electronic Signature(s) Signed: 09/14/2023 10:02:27 AM By: Karie Schwalbe RN Entered By: Karie Schwalbe on 09/14/2023 06:19:34 -------------------------------------------------------------------------------- Lower Extremity Assessment Details Patient Name: Date of Service: [name] Officer, [name] 09/14/2023 8:00 Joan M Medical Record Number: [id number] Patient Account Number: [id number] Date of Birth/Sex: Treating RN: [birth year] (57 y.o. [name] Blazing Primary Care Darneisha Windhorst: PCP, [name]: Referring [name]: Treating Jaleigh Mccroskey/Extender: Sherryl Manges in Treatment: 10 Edema Assessment Assessed: [Left: No] [Right: No] Edema: [Left: Ye] [Right: s] Calf Left: Right: Point of Measurement: 32 cm From  Medial Instep 52 cm Ankle Left: Right: Point of Measurement: 10 cm From Medial Instep 29.5 cm Vascular Assessment Pulses: Dorsalis Pedis Palpable: [Left:Yes] Extremity colors, hair growth, and conditions: Extremity Color: [Left:Hyperpigmented] Hair Growth on Extremity: [Left:No] Temperature of Extremity: [Left:Warm] Capillary Refill: [Left:< 3 seconds] Dependent Rubor: [Left:No No] Electronic Signature(s) Signed: 09/14/2023 10:02:27 AM By: Karie Schwalbe RN Entered By: Karie Schwalbe on 09/14/2023 05:32:28 Claude Manges Joan ([id number]) [id number] Page 3 of 9 -------------------------------------------------------------------------------- Multi Wound Chart Details Patient Name: Date of Service: Joan Joan N,  [name] 09/14/2023 8:00 Joan M Medical Record Number: [id number] Patient Account Number: [id number] Date of Birth/Sex: Treating RN: 04-Jun-1966 (57 y.o. F) Primary Care Aayansh Codispoti: PCP, [name]: Referring Mayleigh Tetrault: Treating [name]/Extender: Sherryl Manges in Treatment: 10 Vital Signs Height(in): 71 Pulse(bpm): 112 Weight(lbs): 352 Blood Pressure(mmHg): 198/100 Body Mass Index(BMI): 49.1 Temperature(F): 97.3 Respiratory Rate(breaths/min): 18 [6:Photos:] [[name]/Joan:[name]/Joan] Left, Lateral Lower Leg Left, Medial Lower Leg [name]/Joan Wound Location: Gradually Appeared Gradually Appeared [name]/Joan Wounding Event: Lymphedema Venous Leg Ulcer [name]/Joan Primary Etiology: Anemia, Asthma, Congestive Heart Anemia, Asthma, Congestive Heart [name]/Joan Comorbid History: Failure, Deep Vein Thrombosis, Failure, Deep Vein Thrombosis, Hypertension, Osteoarthritis Hypertension, Osteoarthritis 08/09/2020 09/06/2023 [name]/Joan Date Acquired: 10 1 [name]/Joan Weeks of Treatment: Open Open [name]/Joan Wound Status: No No [name]/Joan Wound Recurrence: 1.2x0.9x0.2 0.2x0.2x0.1 [name]/Joan Measurements L x W x D (cm) 0.848 0.031 [name]/Joan Joan (cm) : rea 0.17 0.003 [name]/Joan Volume (cm) : 76.50% 92.10% [name]/Joan % Reduction in Joan  rea: 84.30% 92.30% [name]/Joan % Reduction in Volume: Full Thickness Without Exposed Full Thickness Without Exposed [name]/Joan Classification: Support Structures Support Structures Medium Medium [name]/Joan Exudate Joan mount: Serous Serosanguineous [name]/Joan Exudate Type: amber red, brown [name]/Joan Exudate Color: Distinct, outline attached [name]/[name]/Joan Wound Margin: Large (67-100%) Large (67-100%) [name]/Joan Granulation Joan mount: Pink Red [name]/Joan Granulation Quality: Small (1-33%) Small (1-33%) [name]/Joan Necrotic Joan mount: Fat Layer (Subcutaneous Tissue): Yes Fat Layer (Subcutaneous Tissue): Yes [name]/Joan Exposed Structures: Fascia: No Fascia: No Tendon: No Tendon: No Muscle: No Muscle: No Joint: No Joint: No Bone: No Bone: No Small (1-33%) None [name]/Joan Epithelialization: Debridement - Excisional Debridement - Excisional [name]/Joan Debridement: Pre-procedure Verification/Time Out 08:40 08:40 [name]/Joan Taken: Lidocaine 4% Topical Solution Lidocaine 4% Topical Solution [name]/Joan Pain Control: Subcutaneous, Slough Subcutaneous, Slough [name]/Joan Tissue Debrided: Skin/Subcutaneous Tissue Skin/Subcutaneous Tissue [name]/Joan Level: 0.85 0.03 [name]/Joan Debridement Joan (sq cm): rea Curette Curette [name]/Joan Instrument: Minimum Minimum [name]/Joan Bleeding: Pressure Pressure [name]/Joan Hemostasis Joan chieved: 0 0 [name]/Joan Procedural Pain: 0 0 [name]/Joan Post Procedural Pain: Procedure was tolerated well Procedure was tolerated well [name]/Joan Debridement Treatment Response: 1.2x0.9x0.2 0.2x0.2x0.1 [name]/Joan Post Debridement Measurements L x W x D (cm) 0.17 0.003 [name]/Joan Post Debridement Volume: (cm) CHARLISHA, HAMBERG Joan ([id number]) [id number] Page 4 of 9 Scarring: Yes [name]/Joan Periwound Skin Texture: No Abnormalities Noted No Abnormalities Noted [name]/Joan Periwound Skin Moisture: Hemosiderin Staining: Yes No Abnormalities Noted [name]/Joan Periwound Skin Color: No Abnormality No Abnormality [name]/Joan Temperature: Compression Therapy Debridement [name]/Joan Procedures Performed: Debridement Treatment Notes Electronic  Signature(s) Signed: 09/14/2023 10:26:47 AM By: Duanne Guess MD FACS Entered By: Duanne Guess on 09/14/2023 05:52:59 -------------------------------------------------------------------------------- Multi-Disciplinary Care Plan Details Patient Name: Date of Service: [name] Officer, [name] 09/14/2023 8:00 Joan M Medical Record Number: [id number] Patient Account Number: [id number] Date of Birth/Sex: Treating RN: [birth year] (57 y.o. [name] Primary Care Demichael Traum: PCP, [name]: Referring Oleta Gunnoe: Treating Hagen Bohorquez/Extender: Sherryl Manges in Treatment: 10 Multidisciplinary Care Plan reviewed with physician Active Inactive Venous Leg Ulcer Nursing Diagnoses: Actual venous Insuffiency (use after diagnosis is confirmed) Knowledge deficit related to disease process and management Goals: Patient will maintain optimal edema control Date Initiated: 08/29/2023 Target Resolution Date: 09/26/2023 Goal Status: Active Interventions: Assess peripheral edema status every visit. Compression as ordered Provide education on venous insufficiency Treatment Activities: Therapeutic compression applied : 08/29/2023 Notes: Wound/Skin Impairment Nursing Diagnoses: Impaired tissue integrity Goals: Patient/caregiver will verbalize understanding of skin care regimen Date Initiated: 07/02/2023 Target Resolution Date: 09/25/2023 Goal Status: Active Interventions: Assess ulceration(s) every visit Treatment Activities: Skin care  regimen initiated : 07/02/2023 Notes: Electronic Signature(s) YOSHIKO, ZUNO Joan ([id number]) [id number] Page 5 of 9 Signed: 09/14/2023 12:52:17 PM By: Zenaida Deed RN, BSN Entered By: Zenaida Deed on 09/14/2023 05:34:54 -------------------------------------------------------------------------------- Pain Assessment Details Patient Name: Date of Service: [name] Officer, [name] 09/14/2023 8:00 Joan M Medical Record Number:  [id number] Patient Account Number: [id number] Date of Birth/Sex: Treating RN: 07/07/1966 (57 y.o. [name] Blazing Primary Care Yeiren Whitecotton: PCP, [name]: Referring Louisa Favaro: Treating Tysheem Accardo/Extender: Sherryl Manges in Treatment: 10 Active Problems Location of Pain Severity and Description of Pain Patient Has Paino No Site Locations Pain Management and Medication Current Pain Management: Electronic Signature(s) Signed: 09/14/2023 10:02:27 AM By: Karie Schwalbe RN Entered By: Karie Schwalbe on 09/14/2023 05:31:43 -------------------------------------------------------------------------------- Patient/Caregiver Education Details Patient Name: Date of Service: [name] Officer, [name] 12/20/2024andnbsp8:00 Joan M Medical Record Number: [id number] Patient Account Number: [id number] Date of Birth/Gender: Treating RN: 05/29/1966 (57 y.o. [name] Primary Care Physician: PCP, [name]: Referring Physician: Treating Physician/Extender: Sherryl Manges in Treatment: 10 Education Assessment Education Provided To: Patient AUSTYN, HAIRE ([id number]) [id number] Page 6 of 9 Education Topics Provided Venous: Methods: Explain/Verbal Responses: Reinforcements needed, State content correctly Wound/Skin Impairment: Methods: Explain/Verbal Responses: Reinforcements needed, State content correctly Electronic Signature(s) Signed: 09/14/2023 12:52:17 PM By: Zenaida Deed RN, BSN Entered By: Zenaida Deed on 09/14/2023 05:35:16 -------------------------------------------------------------------------------- Wound Assessment Details Patient Name: Date of Service: [name] Officer, [name] 09/14/2023 8:00 Joan M Medical Record Number: [id number] Patient Account Number: [id number] Date of Birth/Sex: Treating RN: Jul 20, 1966 (57 y.o. [name] Blazing Primary Care Syrena Burges: PCP, [name]: Referring Boden Stucky: Treating  Dean Wonder/Extender: Sherryl Manges in Treatment: 10 Wound Status Wound Number: 6 Primary Lymphedema Etiology: Wound Location: Left, Lateral Lower Leg Wound Open Wounding Event: Gradually Appeared Status: Date Acquired: 08/09/2020 Comorbid Anemia, Asthma, Congestive Heart Failure, Deep Vein Weeks Of Treatment: 10 History: Thrombosis, Hypertension, Osteoarthritis Clustered Wound: No Photos Wound Measurements Length: (cm) 1.2 Width: (cm) 0.9 Depth: (cm) 0.2 Area: (cm) 0.848 Volume: (cm) 0.17 % Reduction in Area: 76.5% % Reduction in Volume: 84.3% Epithelialization: Small (1-33%) Tunneling: No Undermining: No Wound Description Classification: Full Thickness Without Exposed Suppor Wound Margin: Distinct, outline attached Exudate Amount: Medium Exudate Type: Serous Exudate Color: amber t Structures Foul Odor After Cleansing: No Slough/Fibrino Yes Wound Bed Granulation Amount: Large (67-100%) Exposed Structure Granulation Quality: Pink Fascia Exposed: No Necrotic Amount: Small (1-33%) Fat Layer (Subcutaneous Tissue) Exposed: Yes Necrotic Quality: Adherent Slough Tendon Exposed: No Muscle Exposed: No ALAINE, OMORI Joan ([id number]) [id number] Page 7 of 9 Joint Exposed: No Bone Exposed: No Periwound Skin Texture Texture Color No Abnormalities Noted: Yes No Abnormalities Noted: No Hemosiderin Staining: Yes Moisture No Abnormalities Noted: Yes Temperature / Pain Temperature: No Abnormality Treatment Notes Wound #6 (Lower Leg) Wound Laterality: Left, Lateral Cleanser Peri-Wound Care Topical Primary Dressing Hydrofera Blue Ready Transfer Foam, 2.5x2.5 (in/in) Discharge Instruction: Apply directly to wound bed as directed Secondary Dressing Woven Gauze Sponge, Non-Sterile 4x4 in Discharge Instruction: Apply over primary dressing as directed. Secured With Compression Wrap Unnaboot w/Calamine, 4x10 (in/yd) Discharge Instruction: Apply  Unnaboot as directed. Compression Stockings Add-Ons Electronic Signature(s) Signed: 09/14/2023 10:02:27 AM By: Karie Schwalbe RN Entered By: Karie Schwalbe on 09/14/2023 05:37:51 -------------------------------------------------------------------------------- Wound Assessment Details Patient Name: Date of Service: [name] Officer, [name] 09/14/2023 8:00 Joan M Medical Record Number: [id number] Patient Account Number: [id number] Date of Birth/Sex: Treating RN: 26-Mar-1966 (57 y.o. F) [place], Randa Evens Primary  Care Sinead Hockman: PCP, [name]: Referring Betzaida Cremeens: Treating Nykole Matos/Extender: Sherryl Manges in Treatment: 10 Wound Status Wound Number: 7 Primary Venous Leg Ulcer Etiology: Wound Location: Left, Medial Lower Leg Wound Open Wounding Event: Gradually Appeared Status: Date Acquired: 09/06/2023 Comorbid Anemia, Asthma, Congestive Heart Failure, Deep Vein Weeks Of Treatment: 1 History: Thrombosis, Hypertension, Osteoarthritis Clustered Wound: No Photos MONICKA, ASLESON Joan ([id number]) [id number] Page 8 of 9 Wound Measurements Length: (cm) 0.2 Width: (cm) 0.2 Depth: (cm) 0.1 Area: (cm) 0.031 Volume: (cm) 0.003 % Reduction in Area: 92.1% % Reduction in Volume: 92.3% Epithelialization: None Tunneling: No Undermining: No Wound Description Classification: Full Thickness Without Exposed Support Structures Exudate Amount: Medium Exudate Type: Serosanguineous Exudate Color: red, brown Foul Odor After Cleansing: No Slough/Fibrino Yes Wound Bed Granulation Amount: Large (67-100%) Exposed Structure Granulation Quality: Red Fascia Exposed: No Necrotic Amount: Small (1-33%) Fat Layer (Subcutaneous Tissue) Exposed: Yes Necrotic Quality: Adherent Slough Tendon Exposed: No Muscle Exposed: No Joint Exposed: No Bone Exposed: No Periwound Skin Texture Texture Color No Abnormalities Noted: No No Abnormalities Noted: Yes Moisture Temperature /  Pain No Abnormalities Noted: Yes Temperature: No Abnormality Treatment Notes Wound #7 (Lower Leg) Wound Laterality: Left, Medial Cleanser Soap and Water Discharge Instruction: May shower and wash wound with dial antibacterial soap and water prior to dressing change. Vashe 5.8 (oz) Discharge Instruction: Cleanse the wound with Vashe prior to applying Joan clean dressing using gauze sponges, not tissue or cotton balls. Peri-Wound Care Sween Lotion (Moisturizing lotion) Discharge Instruction: Apply moisturizing lotion as directed Topical Primary Dressing Hydrofera Blue Ready Transfer Foam, 2.5x2.5 (in/in) Discharge Instruction: Apply directly to wound bed as directed Secondary Dressing ABD Pad, 5x9 Discharge Instruction: Apply over primary dressing as directed. Secured With Compression Wrap Unnaboot w/Calamine, 4x10 (in/yd) Discharge Instruction: Apply Unnaboot as directed. Compression Stockings Add-Ons KHAIA, SEATON Joan ([id number]) [id number] Page 9 of 9 Electronic Signature(s) Signed: 09/14/2023 10:02:27 AM By: Karie Schwalbe RN Entered By: Karie Schwalbe on 09/14/2023 05:34:15 -------------------------------------------------------------------------------- Vitals Details Patient Name: Date of Service: [name] Officer, [name] 09/14/2023 8:00 Joan M Medical Record Number: [id number] Patient Account Number: [id number] Date of Birth/Sex: Treating RN: 27-Feb-1966 (57 y.o. [name] Blazing Primary Care Nisha Dhami: PCP, [name]: Referring Aliani Caccavale: Treating Dehlia Kilner/Extender: Sherryl Manges in Treatment: 10 Vital Signs Time Taken: 08:29 Temperature (F): 97.3 Height (in): 71 Pulse (bpm): 112 Weight (lbs): 352 Respiratory Rate (breaths/min): 18 Body Mass Index (BMI): 49.1 Blood Pressure (mmHg): 198/100 Reference Range: 80 - 120 mg / dl Electronic Signature(s) Signed: 09/14/2023 10:02:27 AM By: Karie Schwalbe RN Entered By: Karie Schwalbe on  09/14/2023 05:31:35

## 2023-09-15 NOTE — Progress Notes (Signed)
SHAMINA, XIANG Joan ([id number]) [id number] Page 1 of 10 Visit Report for 09/14/2023 Chief Complaint Document Details Patient Name: Date of Service: [name], [place] RY Joan. 09/14/2023 8:00 Joan M Medical Record Number: [id number] Patient Account Number: [id number] Date of Birth/Sex: Treating RN: [birth year] (57 y.o. F) Primary Care Provider: PCP, [name]: Referring Provider: Treating Provider/Extender: Sherryl Manges in Treatment: 10 Information Obtained from: Patient Chief Complaint Patient presents for treatment of open ulcers due to lymphedema Electronic Signature(s) Signed: 09/14/2023 10:26:47 AM By: Duanne Guess MD FACS Entered By: Duanne Guess on 09/14/2023 05:53:22 -------------------------------------------------------------------------------- Cellular or Tissue Based Product Details Patient Name: Date of Service: [name], [name] 09/14/2023 8:00 Joan M Medical Record Number: [id number] Patient Account Number: [id number] Date of Birth/Sex: Treating RN: April 16, 1966 (57 y.o. [name] Primary Care Provider: PCP, [name]: Referring Provider: Treating Provider/Extender: Sherryl Manges in Treatment: 10 Cellular or Tissue Based Product Type Wound #6 Left,Lateral Lower Leg Applied to: Performed By: Physician Duanne Guess, MD The following information was scribed by: Zenaida Deed The information was scribed for: Duanne Guess Cellular or Tissue Based Product Type: Epifix Level of Consciousness (Pre-procedure): Awake and Alert Pre-procedure Verification/Time Out Yes - 08:45 Taken: Location: trunk / arms / legs Wound Size (sq cm): 1.08 Product Size (sq cm): 2 Waste Size (sq cm): 0 Amount of Product Applied (sq cm): 2 Instrument Used: Forceps Lot #: [id number] Order #: 6 Expiration Date: 02/24/2028 Fenestrated: No Reconstituted: Yes Solution Type: saline Solution Amount: 2 ml Lot #:  [id number] KS Solution Expiration Date: 01/13/2025 Secured: Yes Secured With: Steri-Strips Dressing Applied: Yes Primary Dressing: hydrofera blue ready, adaptic Procedural Pain: 0 Post Procedural Pain: 0 SHAINE, MERLE Joan ([id number]) [id number] Page 2 of 10 Response to Treatment: Procedure was tolerated well Level of Consciousness (Post- Awake and Alert procedure): Post Procedure Diagnosis Same as Pre-procedure Electronic Signature(s) Signed: 09/14/2023 10:26:47 AM By: Duanne Guess MD FACS Signed: 09/14/2023 12:52:17 PM By: Zenaida Deed RN, BSN Entered By: Zenaida Deed on 09/14/2023 05:53:17 -------------------------------------------------------------------------------- Debridement Details Patient Name: Date of Service: [name], [name] 09/14/2023 8:00 Joan M Medical Record Number: [id number] Patient Account Number: [id number] Date of Birth/Sex: Treating RN: 04/09/1966 (57 y.o. [name] Primary Care Provider: PCP, [name]: Referring Provider: Treating Provider/Extender: Sherryl Manges in Treatment: 10 Debridement Performed for Assessment: Wound #7 Left,Medial Lower Leg Performed By: Physician Duanne Guess, MD The following information was scribed by: Zenaida Deed The information was scribed for: Duanne Guess Debridement Type: Debridement Severity of Tissue Pre Debridement: Fat layer exposed Level of Consciousness (Pre-procedure): Awake and Alert Pre-procedure Verification/Time Out Yes - 08:40 Taken: Start Time: 08:43 Pain Control: Lidocaine 4% T opical Solution Percent of Wound Bed Debrided: 100% T Area Debrided (cm): otal 0.03 Tissue and other material debrided: Viable, Non-Viable, Slough, Subcutaneous, Slough Level: Skin/Subcutaneous Tissue Debridement Description: Excisional Instrument: Curette Bleeding: Minimum Hemostasis Achieved: Pressure Procedural Pain: 0 Post Procedural Pain: 0 Response to  Treatment: Procedure was tolerated well Level of Consciousness (Post- Awake and Alert procedure): Post Debridement Measurements of Total Wound Length: (cm) 0.2 Width: (cm) 0.2 Depth: (cm) 0.1 Volume: (cm) 0.003 Character of Wound/Ulcer Post Debridement: Improved Severity of Tissue Post Debridement: Fat layer exposed Post Procedure Diagnosis Same as Pre-procedure Electronic Signature(s) Signed: 09/14/2023 10:26:47 AM By: Duanne Guess MD FACS Signed: 09/14/2023 12:52:17 PM By: Zenaida Deed RN, BSN Entered By: Zenaida Deed on 09/14/2023 05:46:01 Claude Manges Joan ([id number]) [id number] Page 3 of  10 -------------------------------------------------------------------------------- Debridement Details Patient Name: Date of Service: [name], [place] R[place] Joan. 09/14/2023 8:00 Joan M Medical Record Number: [id number] Patient Account Number: [id number] Date of Birth/Sex: Treating RN: 01-06-1966 (57 y.o. [name] Primary Care Provider: PCP, [name]: Referring Provider: Treating Provider/Extender: Sherryl Manges in Treatment: 10 Debridement Performed for Assessment: Wound #6 Left,Lateral Lower Leg Performed By: Physician Duanne Guess, MD The following information was scribed by: Zenaida Deed The information was scribed for: Duanne Guess Debridement Type: Debridement Level of Consciousness (Pre-procedure): Awake and Alert Pre-procedure Verification/Time Out Yes - 08:40 Taken: Start Time: 08:43 Pain Control: Lidocaine 4% T opical Solution Percent of Wound Bed Debrided: 100% T Area Debrided (cm): otal 0.85 Tissue and other material debrided: Viable, Non-Viable, Slough, Subcutaneous, Slough Level: Skin/Subcutaneous Tissue Debridement Description: Excisional Instrument: Curette Bleeding: Minimum Hemostasis Achieved: Pressure Procedural Pain: 0 Post Procedural Pain: 0 Response to Treatment: Procedure was tolerated  well Level of Consciousness (Post- Awake and Alert procedure): Post Debridement Measurements of Total Wound Length: (cm) 1.2 Width: (cm) 0.9 Depth: (cm) 0.2 Volume: (cm) 0.17 Character of Wound/Ulcer Post Debridement: Improved Post Procedure Diagnosis Same as Pre-procedure Electronic Signature(s) Signed: 09/14/2023 10:26:47 AM By: Duanne Guess MD FACS Signed: 09/14/2023 12:52:17 PM By: Zenaida Deed RN, BSN Entered By: Zenaida Deed on 09/14/2023 05:46:35 -------------------------------------------------------------------------------- HPI Details Patient Name: Date of Service: [name], [name] 09/14/2023 8:00 Joan M Medical Record Number: [id number] Patient Account Number: [id number] Date of Birth/Sex: Treating RN: July 08, 1966 (57 y.o. F) Primary Care Provider: PCP, [name]: Referring Provider: Treating Provider/Extender: Sherryl Manges in Treatment: 10 History of Present Illness HPI Description: ADMISSION TOTIANNA, POLL Joan ([id number]) [id number] Page 4 of 10 07/02/2023 ***ABIs non-compressible bilaterally; triphasic Doppler signals*** This is Joan 57 year old morbidly obese patient who is not Joan diabetic. She does have hypertension and CHF. Although she does not carry this diagnosis in her electronic medical record, she clearly has lymphedema. She has multiple wounds on her bilateral lower extremities, 1 of which has been present for 3 years. She has been treating her wounds with peroxide and bacitracin. Her provider has given her various courses of antibiotics, but the wounds have not closed. She was told not to wear compression stockings due to her history of DVT but this was well over Joan year ago. She has never worn lymphedema pumps. , 07/09/2023: Several of the smaller wounds have closed. Remaining wounds include the left proximal medial ulcer and the left lateral distal ulcer, as well as the paired wounds on her right lateral lower  leg. There is slough accumulation at all sites. 07/16/2023: The left distal lateral leg ulcer is Joan little bit smaller and quite Joan bit cleaner. The more proximal medial left lower leg ulcer still has Joan fair amount of nonviable tissue present and this more tender. Her wrap did slide down and it appears that it came to rest right in the middle of this wound causing some pressure on the site. The paired right lateral lower leg ulcers are small and superficial with Joan little bit of slough and eschar present. 07/23/2023: The right lateral lower leg ulcers are nearly closed but remain just barely open underneath Joan layer of eschar. The proximal medial left lower leg ulcer has less nonviable tissue present today and is smaller. The left distal lateral leg ulcer is unchanged in size, but the surface is clean and the quality of the tissue is improving. 07/30/2023: Both right leg ulcers are healed.  The proximal medial left leg ulcer is smaller and more superficial. The left distal lateral leg ulcer is still unchanged in size. There is more granulation tissue filling in, but the surface is still fairly fibrotic. Edema control is adequate. 08/07/2023: She unfortunately had to travel out of town urgently to go to [place] this past week and as Joan result, was on her feet and with her legs in Joan dependent position quite Joan bit. This has resulted in the medial leg ulcer getting Joan little bit larger and the lateral leg ulcer getting Joan little bit deeper. Her juxta lite stockings were delivered and she has them with her today. She has been approved for Epifix so we will proceed with application today. 08/13/2023: The medial leg ulcer is quite Joan bit smaller this week and the lateral leg ulcer also measured slightly smaller but the tissue surface is healthier- looking. She has been on her feet again Joan lot this past week so her edema control remains poor. 08/20/2023: The medial leg ulcer is nearly healed with just Joan tiny open  area remaining underneath Joan layer of eschar. The lateral leg ulcer is unchanged and the surface remains Joan bit fibrotic. Edema control is better this week. 12/3; the medial ulcer is healed. On the left lateral we still have Joan punched-out area with roughly 0.4 cm in depth. The granulation tissue at the base of this looks healthy. EpiFix reapplied in the standard fashion. 12/12; the patient's medial left leg ulcer was healed however her Unna boot fell down this week and she has Joan new wound on the upper medial lower leg in the setting of uncontrolled swelling. The wound that we she had last week where we are putting epi fix is about the same but the swelling is just too much. She did not come in with any stocking on the right leg 09/14/2023: The medial leg ulcer is Joan little bit smaller today. The lateral leg ulcer continues to contract. There is still some depth to it and Joan thick layer of slough has accumulated. Electronic Signature(s) Signed: 09/14/2023 10:26:47 AM By: Duanne Guess MD FACS Entered By: Duanne Guess on 09/14/2023 05:54:39 -------------------------------------------------------------------------------- Physical Exam Details Patient Name: Date of Service: [name], [name] 09/14/2023 8:00 Joan M Medical Record Number: [id number] Patient Account Number: [id number] Date of Birth/Sex: Treating RN: [birth year] (57 y.o. F) Primary Care Provider: PCP, [name]: Referring Provider: Treating Provider/Extender: Sherryl Manges in Treatment: 10 Constitutional Hypertensive, asymptomatic. Tachycardic, asymptomatic. . . no acute distress. Respiratory Normal work of breathing on room air.. Notes 09/14/2023: The medial leg ulcer is Joan little bit smaller today. The lateral leg ulcer continues to contract. There is still some depth to it and Joan thick layer of slough has accumulated. Electronic Signature(s) Signed: 09/14/2023 10:26:47 AM By: Duanne Guess MD FACS Entered  By: Duanne Guess on 09/14/2023 05:55:22 Claude Manges Joan ([id number]) [id number] Page 5 of 10 -------------------------------------------------------------------------------- Physician Orders Details Patient Name: Date of Service: [name], [place] R[place] Joan. 09/14/2023 8:00 Joan M Medical Record Number: [id number] Patient Account Number: [id number] Date of Birth/Sex: Treating RN: 1965-12-15 (57 y.o. [name] Primary Care Provider: PCP, [name]: Referring Provider: Treating Provider/Extender: Sherryl Manges in Treatment: 10 The following information was scribed by: Zenaida Deed The information was scribed for: Duanne Guess Verbal / Phone Orders: No Diagnosis Coding ICD-10 Coding Code Description [id number] Non-pressure chronic ulcer of other part of left lower leg with fat  layer exposed I87.319 Chronic venous hypertension (idiopathic) with ulcer of unspecified lower extremity I89.0 Lymphedema, not elsewhere classified I50.32 Chronic diastolic (congestive) heart failure E66.01 Morbid (severe) obesity due to excess calories Follow-up Appointments ppointment in 1 week. - Dr. Lady Gary Return Joan Anesthetic (In clinic) Topical Lidocaine 4% applied to wound bed Cellular or Tissue Based Products Wound #6 Left,Lateral Lower Leg Cellular or Tissue Based Product Type: - Epifix #6 Cellular or Tissue Based Product applied to wound bed, secured with steri-strips, cover with Adaptic or Mepitel. (DO NOT REMOVE). Bathing/ Shower/ Hygiene May shower with protection but do not get wound dressing(s) wet. Protect dressing(s) with water repellant cover (for example, large plastic bag) or Joan cast cover and may then take shower. - Keep legs dry until next appointment. Use Cast Protectors if so desired. Other Bathing/Shower/Hygiene Orders/Instructions: - May want to sponge bath if legs cannot be kept dry until next appointment. Additional Orders /  Instructions Follow Nutritious Diet - Try and increase Protein intake. The goal is 70g-100g per day. Examples are chicken, fish, meat, pork, Greek yogurt, eggs etc. Lymphedema Treatment Plan - Exercise, Compression and Elevation Exercise daily as tolerated. (Walking, ROM, Calf Pumps and Toe Taps) Compression Wraps as ordered Elevate legs 30 - 60 minutes at or above heart level at least 3 - 4 times daily as able/tolerated Avoid standing for long periods and elevate leg(s) parallel to the floor when sitting Wound Treatment Wound #6 - Lower Leg Wound Laterality: Left, Lateral Prim Dressing: Hydrofera Blue Ready Transfer Foam, 2.5x2.5 (in/in) 1 x Per Week ary Discharge Instructions: Apply directly to wound bed as directed Secondary Dressing: Woven Gauze Sponge, Non-Sterile 4x4 in 1 x Per Week Discharge Instructions: Apply over primary dressing as directed. Compression Wrap: Unnaboot w/Calamine, 4x10 (in/yd) 1 x Per Week Discharge Instructions: Apply Unnaboot as directed. Wound #7 - Lower Leg Wound Laterality: Left, Medial Cleanser: Soap and Water 1 x Per Week/15 Days Discharge Instructions: May shower and wash wound with dial antibacterial soap and water prior to dressing change. Cleanser: Vashe 5.8 (oz) 1 x Per Week/15 Days Discharge Instructions: Cleanse the wound with Vashe prior to applying Joan clean dressing using gauze sponges, not tissue or cotton balls. Peri-Wound Care: Sween Lotion (Moisturizing lotion) 1 x Per Week/15 Days Discharge Instructions: Apply moisturizing lotion as directed MEI, RUTHENBERG Joan ([id number]) [id number] Page 6 of 10 Prim Dressing: Hydrofera Blue Ready Transfer Foam, 2.5x2.5 (in/in) 1 x Per Week/15 Days ary Discharge Instructions: Apply directly to wound bed as directed Secondary Dressing: ABD Pad, 5x9 1 x Per Week/15 Days Discharge Instructions: Apply over primary dressing as directed. Compression Wrap: Unnaboot w/Calamine, 4x10 (in/yd)  1 x Per Week/15 Days Discharge Instructions: Apply Unnaboot as directed. Electronic Signature(s) Signed: 09/14/2023 10:26:47 AM By: Duanne Guess MD FACS Entered By: Duanne Guess on 09/14/2023 05:56:57 -------------------------------------------------------------------------------- Problem List Details Patient Name: Date of Service: [name], [name] 09/14/2023 8:00 Joan M Medical Record Number: [id number] Patient Account Number: [id number] Date of Birth/Sex: Treating RN: 1966-03-13 (57 y.o. [name] Primary Care Provider: PCP, [name]: Referring Provider: Treating Provider/Extender: Sherryl Manges in Treatment: 10 Active Problems ICD-10 Encounter Code Description Active Date MDM Diagnosis L97.822 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osed10/03/2023 No Yes I87.319 Chronic venous hypertension (idiopathic) with ulcer of unspecified lower 07/02/2023 No Yes extremity I89.0 Lymphedema, not elsewhere classified 07/02/2023 No Yes I50.32 Chronic diastolic (congestive) heart failure 07/02/2023 No Yes E66.01 Morbid (severe)  obesity due to excess calories 07/02/2023 No Yes Inactive Problems Resolved Problems ICD-10 Code Description Active Date Resolved Date L97.812 Non-pressure chronic ulcer of other part of right lower leg with fat layer exposed 07/02/2023 07/02/2023 Electronic Signature(s) Signed: 09/14/2023 10:26:47 AM By: Duanne Guess MD FACS Entered By: Duanne Guess on 09/14/2023 05:52:45 Claude Manges Joan ([id number]) [id number] Page 7 of 10 -------------------------------------------------------------------------------- Progress Note Details Patient Name: Date of Service: [name], [place] R[place] Joan. 09/14/2023 8:00 Joan M Medical Record Number: [id number] Patient Account Number: [id number] Date of Birth/Sex: Treating RN: 13-Apr-1966 (57 y.o. F) Primary Care Provider: PCP, NO Other  Clinician: Referring Provider: Treating Provider/Extender: Sherryl Manges in Treatment: 10 Subjective Chief Complaint Information obtained from Patient Patient presents for treatment of open ulcers due to lymphedema History of Present Illness (HPI) ADMISSION 07/02/2023 ***ABIs non-compressible bilaterally; triphasic Doppler signals*** This is Joan 57 year old morbidly obese patient who is not Joan diabetic. She does have hypertension and CHF. Although she does not carry this diagnosis in her electronic medical record, she clearly has lymphedema. She has multiple wounds on her bilateral lower extremities, 1 of which has been present for 3 years. She has been treating her wounds with peroxide and bacitracin. Her provider has given her various courses of antibiotics, but the wounds have not closed. She was told not to wear compression stockings due to her history of DVT but this was well over Joan year ago. She has never worn lymphedema pumps. , 07/09/2023: Several of the smaller wounds have closed. Remaining wounds include the left proximal medial ulcer and the left lateral distal ulcer, as well as the paired wounds on her right lateral lower leg. There is slough accumulation at all sites. 07/16/2023: The left distal lateral leg ulcer is Joan little bit smaller and quite Joan bit cleaner. The more proximal medial left lower leg ulcer still has Joan fair amount of nonviable tissue present and this more tender. Her wrap did slide down and it appears that it came to rest right in the middle of this wound causing some pressure on the site. The paired right lateral lower leg ulcers are small and superficial with Joan little bit of slough and eschar present. 07/23/2023: The right lateral lower leg ulcers are nearly closed but remain just barely open underneath Joan layer of eschar. The proximal medial left lower leg ulcer has less nonviable tissue present today and is smaller. The left distal lateral leg ulcer is  unchanged in size, but the surface is clean and the quality of the tissue is improving. 07/30/2023: Both right leg ulcers are healed. The proximal medial left leg ulcer is smaller and more superficial. The left distal lateral leg ulcer is still unchanged in size. There is more granulation tissue filling in, but the surface is still fairly fibrotic. Edema control is adequate. 08/07/2023: She unfortunately had to travel out of town urgently to go to [place] this past week and as Joan result, was on her feet and with her legs in Joan dependent position quite Joan bit. This has resulted in the medial leg ulcer getting Joan little bit larger and the lateral leg ulcer getting Joan little bit deeper. Her juxta lite stockings were delivered and she has them with her today. She has been approved for Epifix so we will proceed with application today. 08/13/2023: The medial leg ulcer is quite Joan bit smaller this week and the lateral leg ulcer also measured slightly smaller but the tissue surface is healthier- looking. She  has been on her feet again Joan lot this past week so her edema control remains poor. 08/20/2023: The medial leg ulcer is nearly healed with just Joan tiny open area remaining underneath Joan layer of eschar. The lateral leg ulcer is unchanged and the surface remains Joan bit fibrotic. Edema control is better this week. 12/3; the medial ulcer is healed. On the left lateral we still have Joan punched-out area with roughly 0.4 cm in depth. The granulation tissue at the base of this looks healthy. EpiFix reapplied in the standard fashion. 12/12; the patient's medial left leg ulcer was healed however her Unna boot fell down this week and she has Joan new wound on the upper medial lower leg in the setting of uncontrolled swelling. The wound that we she had last week where we are putting epi fix is about the same but the swelling is just too much. She did not come in with any stocking on the right leg 09/14/2023: The medial leg  ulcer is Joan little bit smaller today. The lateral leg ulcer continues to contract. There is still some depth to it and Joan thick layer of slough has accumulated. Objective Constitutional Hypertensive, asymptomatic. Tachycardic, asymptomatic. no acute distress. Vitals Time Taken: 8:29 AM, Height: 71 in, Weight: 352 lbs, BMI: 49.1, Temperature: 97.3 F, Pulse: 112 bpm, Respiratory Rate: 18 breaths/min, Blood Pressure: 198/100 mmHg. Respiratory Normal work of breathing on room air.Raul Del, Lamar Blinks ([id number]) [id number] Page 8 of 10 General Notes: 09/14/2023: The medial leg ulcer is Joan little bit smaller today. The lateral leg ulcer continues to contract. There is still some depth to it and Joan thick layer of slough has accumulated. Integumentary (Hair, Skin) Wound #6 status is Open. Original cause of wound was Gradually Appeared. The date acquired was: 08/09/2020. The wound has been in treatment 10 weeks. The wound is located on the Left,Lateral Lower Leg. The wound measures 1.2cm length x 0.9cm width x 0.2cm depth; 0.848cm^2 area and 0.17cm^3 volume. There is Fat Layer (Subcutaneous Tissue) exposed. There is no tunneling or undermining noted. There is Joan medium amount of serous drainage noted. The wound margin is distinct with the outline attached to the wound base. There is large (67-100%) pink granulation within the wound bed. There is Joan small (1-33%) amount of necrotic tissue within the wound bed including Adherent Slough. The periwound skin appearance had no abnormalities noted for texture. The periwound skin appearance had no abnormalities noted for moisture. The periwound skin appearance exhibited: Hemosiderin Staining. Periwound temperature was noted as No Abnormality. Wound #7 status is Open. Original cause of wound was Gradually Appeared. The date acquired was: 09/06/2023. The wound has been in treatment 1 weeks. The wound is located on the Left,Medial Lower Leg. The  wound measures 0.2cm length x 0.2cm width x 0.1cm depth; 0.031cm^2 area and 0.003cm^3 volume. There is Fat Layer (Subcutaneous Tissue) exposed. There is no tunneling or undermining noted. There is Joan medium amount of serosanguineous drainage noted. There is large (67-100%) red granulation within the wound bed. There is Joan small (1-33%) amount of necrotic tissue within the wound bed including Adherent Slough. The periwound skin appearance had no abnormalities noted for moisture. The periwound skin appearance had no abnormalities noted for color. Periwound temperature was noted as No Abnormality. Assessment Active Problems ICD-10 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osed Chronic venous hypertension (idiopathic) with ulcer of unspecified lower extremity Lymphedema, not elsewhere classified Chronic diastolic (congestive) heart  failure Morbid (severe) obesity due to excess calories Procedures Wound #6 Pre-procedure diagnosis of Wound #6 is Joan Lymphedema located on the Left,Lateral Lower Leg . There was Joan Excisional Skin/Subcutaneous Tissue Debridement with Joan total area of 0.85 sq cm performed by Duanne Guess, MD. With the following instrument(s): Curette to remove Viable and Non-Viable tissue/material. Material removed includes Subcutaneous Tissue and Slough and after achieving pain control using Lidocaine 4% T opical Solution. No specimens were taken. Joan time out was conducted at 08:40, prior to the start of the procedure. Joan Minimum amount of bleeding was controlled with Pressure. The procedure was tolerated well with Joan pain level of 0 throughout and Joan pain level of 0 following the procedure. Post Debridement Measurements: 1.2cm length x 0.9cm width x 0.2cm depth; 0.17cm^3 volume. Character of Wound/Ulcer Post Debridement is improved. Post procedure Diagnosis Wound #6: Same as Pre-Procedure Pre-procedure diagnosis of Wound #6 is Joan Lymphedema located on the  Left,Lateral Lower Leg. Joan skin graft procedure using Joan bioengineered skin substitute/cellular or tissue based product was performed by Duanne Guess, MD with the following instrument(s): Forceps. Epifix was applied and secured with Steri-Strips. 2 sq cm of product was utilized and 0 sq cm was wasted. Post Application, hydrofera blue ready, adaptic was applied. Joan Time Out was conducted at 08:45, prior to the start of the procedure. The procedure was tolerated well with Joan pain level of 0 throughout and Joan pain level of 0 following the procedure. Post procedure Diagnosis Wound #6: Same as Pre-Procedure . Pre-procedure diagnosis of Wound #6 is Joan Lymphedema located on the Left,Lateral Lower Leg . There was Joan Radio broadcast assistant Compression Therapy Procedure by Zenaida Deed, RN. Post procedure Diagnosis Wound #6: Same as Pre-Procedure Wound #7 Pre-procedure diagnosis of Wound #7 is Joan Venous Leg Ulcer located on the Left,Medial Lower Leg .Severity of Tissue Pre Debridement is: Fat layer exposed. There was Joan Excisional Skin/Subcutaneous Tissue Debridement with Joan total area of 0.03 sq cm performed by Duanne Guess, MD. With the following instrument(s): Curette to remove Viable and Non-Viable tissue/material. Material removed includes Subcutaneous Tissue and Slough and after achieving pain control using Lidocaine 4% T opical Solution. No specimens were taken. Joan time out was conducted at 08:40, prior to the start of the procedure. Joan Minimum amount of bleeding was controlled with Pressure. The procedure was tolerated well with Joan pain level of 0 throughout and Joan pain level of 0 following the procedure. Post Debridement Measurements: 0.2cm length x 0.2cm width x 0.1cm depth; 0.003cm^3 volume. Character of Wound/Ulcer Post Debridement is improved. Severity of Tissue Post Debridement is: Fat layer exposed. Post procedure Diagnosis Wound #7: Same as Pre-Procedure Plan Follow-up Appointments: Return Appointment in  1 week. - Dr. Lady Gary Anesthetic: (In clinic) Topical Lidocaine 4% applied to wound bed Cellular or Tissue Based Products: Wound #6 Left,Lateral Lower Leg: AKIYA, KADRMAS Joan ([id number]) [id number] Page 9 of 10 Cellular or Tissue Based Product Type: - Epifix #6 Cellular or Tissue Based Product applied to wound bed, secured with steri-strips, cover with Adaptic or Mepitel. (DO NOT REMOVE). Bathing/ Shower/ Hygiene: May shower with protection but do not get wound dressing(s) wet. Protect dressing(s) with water repellant cover (for example, large plastic bag) or Joan cast cover and may then take shower. - Keep legs dry until next appointment. Use Cast Protectors if so desired. Other Bathing/Shower/Hygiene Orders/Instructions: - May want to sponge bath if legs cannot be kept dry until next appointment. Additional Orders / Instructions: Follow Nutritious Diet -  Try and increase Protein intake. The goal is 70g-100g per day. Examples are chicken, fish, meat, pork, Greek yogurt, eggs etc. Lymphedema Treatment Plan - Exercise, Compression and Elevation: Exercise daily as tolerated. (Walking, ROM, Calf Pumps and T T oe aps) Compression Wraps as ordered Elevate legs 30 - 60 minutes at or above heart level at least 3 - 4 times daily as able/tolerated Avoid standing for long periods and elevate leg(s) parallel to the floor when sitting WOUND #6: - Lower Leg Wound Laterality: Left, Lateral Prim Dressing: Hydrofera Blue Ready Transfer Foam, 2.5x2.5 (in/in) 1 x Per Week/ ary Discharge Instructions: Apply directly to wound bed as directed Secondary Dressing: Woven Gauze Sponge, Non-Sterile 4x4 in 1 x Per Week/ Discharge Instructions: Apply over primary dressing as directed. Com pression Wrap: Unnaboot w/Calamine, 4x10 (in/yd) 1 x Per Week/ Discharge Instructions: Apply Unnaboot as directed. WOUND #7: - Lower Leg Wound Laterality: Left, Medial Cleanser: Soap and Water 1 x Per Week/15  Days Discharge Instructions: May shower and wash wound with dial antibacterial soap and water prior to dressing change. Cleanser: Vashe 5.8 (oz) 1 x Per Week/15 Days Discharge Instructions: Cleanse the wound with Vashe prior to applying Joan clean dressing using gauze sponges, not tissue or cotton balls. Peri-Wound Care: Sween Lotion (Moisturizing lotion) 1 x Per Week/15 Days Discharge Instructions: Apply moisturizing lotion as directed Prim Dressing: Hydrofera Blue Ready Transfer Foam, 2.5x2.5 (in/in) 1 x Per Week/15 Days ary Discharge Instructions: Apply directly to wound bed as directed Secondary Dressing: ABD Pad, 5x9 1 x Per Week/15 Days Discharge Instructions: Apply over primary dressing as directed. Com pression Wrap: Unnaboot w/Calamine, 4x10 (in/yd) 1 x Per Week/15 Days Discharge Instructions: Apply Unnaboot as directed. 09/14/2023: The medial leg ulcer is Joan little bit smaller today. The lateral leg ulcer continues to contract. There is still some depth to it and Joan thick layer of slough has accumulated. I used Joan curette to debride slough and subcutaneous tissue from the medial leg ulcer. We will continue Hydrofera Blue in this location. I debrided slough and subcutaneous tissue from the lateral leg ulcer. EpiFix was then applied in standard fashion, moistened with saline and using Joan piece of Hydrofera Blue as Joan bolster to hold it down against the wound surface. It was secured in place with Adaptic and Steri-Strips. Unna boot applied for compression. Follow-up in 1 week. Electronic Signature(s) Signed: 09/14/2023 10:26:47 AM By: Duanne Guess MD FACS Entered By: Duanne Guess on 09/14/2023 05:58:16 -------------------------------------------------------------------------------- SuperBill Details Patient Name: Date of Service: [name], [name] 09/14/2023 Medical Record Number: [id number] Patient Account Number: [id number] Date of Birth/Sex: Treating RN: 24-Jul-1966 (57 y.o.  F) Primary Care Provider: PCP, [name]: Referring Provider: Treating Provider/Extender: Sherryl Manges in Treatment: 10 Diagnosis Coding ICD-10 Codes Code Description [phone] 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osed I87.319 Chronic venous hypertension (idiopathic) with ulcer of unspecified lower extremity I89.0 Lymphedema, not elsewhere classified I50.32 Chronic diastolic (congestive) heart failure E66.01 Morbid (severe) obesity due to excess calories Facility Procedures : AVERIL, NETZER CodeKerney Elbe ([id number]) [id number] Q4 Description: [id number] Epifix 18mm disk qty 3 Modifier: [phone].pdf Page 1 3 Quantity: 0 of 10 : CPT4 Code: [id number] IC L I I I Description: 271 - SKIN SUB GRAFT TRNK/ARM/LEG D-10 Diagnosis Description 97.822 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osed 89.0 Lymphedema, not elsewhere classified 87.319 Chronic venous hypertension (idiopathic)  with ulcer of unspecified lower extremity 50.32 Chronic diastolic (congestive) heart failure Modifier: 1 Quantity: : CPT4 Code: [id number] IC L I I I Description: 042 - DEB SUBQ TISSUE 20 SQ CM/< 59 D-10 Diagnosis Description 97.822 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osed 87.319 Chronic venous hypertension (idiopathic) with ulcer of unspecified lower extremity  89.0 Lymphedema, not elsewhere classified 50.32 Chronic diastolic (congestive) heart failure Modifier: 1 Quantity: Physician Procedures : CPT4 Code Description Modifier [id number] - WC PHYS SKIN SUB GRAFT TRNK/ARM/LEG ICD-10 Diagnosis Description L97.822 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osed I89.0 Lymphedema, not elsewhere classified I87.319  Chronic venous hypertension (idiopathic) with ulcer of unspecified lower extremity I50.32 Chronic diastolic (congestive)  heart failure Quantity: 1 : [id number] 11042 - WC PHYS SUBQ TISS 20 SQ CM 59 ICD-10 Diagnosis Description L97.822 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osed I87.319 Chronic venous hypertension (idiopathic) with ulcer of unspecified lower  extremity I89.0 Lymphedema, not elsewhere classified I50.32 Chronic diastolic (congestive) heart failure Quantity: 1 Electronic Signature(s) Signed: 09/14/2023 11:17:44 AM By: Duanne Guess MD FACS Signed: 09/14/2023 12:52:17 PM By: Zenaida Deed RN, BSN Previous Signature: 09/14/2023 10:26:47 AM Version By: Duanne Guess MD FACS Entered By: Zenaida Deed on 09/14/2023 08:05:58

## 2023-09-25 DIAGNOSIS — F419 Anxiety disorder, unspecified: Secondary | ICD-10-CM | POA: Diagnosis not present

## 2023-09-25 DIAGNOSIS — Z79899 Other long term (current) drug therapy: Secondary | ICD-10-CM | POA: Diagnosis not present

## 2023-09-25 DIAGNOSIS — I10 Essential (primary) hypertension: Secondary | ICD-10-CM | POA: Diagnosis not present

## 2023-09-25 DIAGNOSIS — E119 Type 2 diabetes mellitus without complications: Secondary | ICD-10-CM | POA: Diagnosis not present

## 2023-09-25 DIAGNOSIS — K219 Gastro-esophageal reflux disease without esophagitis: Secondary | ICD-10-CM | POA: Diagnosis not present

## 2023-09-25 DIAGNOSIS — M5137 Other intervertebral disc degeneration, lumbosacral region with discogenic back pain only: Secondary | ICD-10-CM | POA: Diagnosis not present

## 2023-09-25 DIAGNOSIS — F331 Major depressive disorder, recurrent, moderate: Secondary | ICD-10-CM | POA: Diagnosis not present

## 2023-09-25 DIAGNOSIS — E559 Vitamin D deficiency, unspecified: Secondary | ICD-10-CM | POA: Diagnosis not present

## 2023-09-25 DIAGNOSIS — R030 Elevated blood-pressure reading, without diagnosis of hypertension: Secondary | ICD-10-CM | POA: Diagnosis not present

## 2023-09-25 DIAGNOSIS — E538 Deficiency of other specified B group vitamins: Secondary | ICD-10-CM | POA: Diagnosis not present

## 2023-09-27 ENCOUNTER — Encounter (HOSPITAL_BASED_OUTPATIENT_CLINIC_OR_DEPARTMENT_OTHER): Payer: Medicare HMO | Attending: General Surgery | Admitting: General Surgery

## 2023-09-27 DIAGNOSIS — Z6841 Body Mass Index (BMI) 40.0 and over, adult: Secondary | ICD-10-CM | POA: Insufficient documentation

## 2023-09-27 DIAGNOSIS — I110 Hypertensive heart disease with heart failure: Secondary | ICD-10-CM | POA: Insufficient documentation

## 2023-09-27 DIAGNOSIS — I890 Lymphedema, not elsewhere classified: Secondary | ICD-10-CM | POA: Diagnosis not present

## 2023-09-27 DIAGNOSIS — I87312 Chronic venous hypertension (idiopathic) with ulcer of left lower extremity: Secondary | ICD-10-CM | POA: Insufficient documentation

## 2023-09-27 DIAGNOSIS — Z86718 Personal history of other venous thrombosis and embolism: Secondary | ICD-10-CM | POA: Insufficient documentation

## 2023-09-27 DIAGNOSIS — L97822 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osed: Secondary | ICD-10-CM | POA: Insufficient documentation

## 2023-09-27 DIAGNOSIS — I5032 Chronic diastolic (congestive) heart failure: Secondary | ICD-10-CM | POA: Insufficient documentation

## 2023-09-28 NOTE — Progress Notes (Signed)
 Joan, Joan Joan ([id number]) [id number] Page 1 of 9 Visit Report for 09/27/2023 Arrival Information Details Patient Name: Date of Service: Joan Joan, Joan Joan. 09/27/2023 7:30 Joan M Medical Record Number: [id number] Patient Account Number: [id number] Date of Birth/Sex: Treating RN: [birth year] (58 y.o. Joan Joan Primary Care Joan Joan: PCP, [name]: Referring Joan Joan: Treating Joan Joan/Extender: Joan Joan in Joan: 12 Visit Information History Since Last Visit Added or deleted any medications: No Patient Arrived: Ambulatory Any new allergies or adverse reactions: No Arrival Time: 07:44 Had Joan fall or experienced change in No Accompanied By: self activities of daily living that may affect Transfer Assistance: None risk of falls: Patient Identification Verified: Yes Signs or symptoms of abuse/neglect since last visito No Secondary Verification Process Completed: Yes Hospitalized since last visit: No Patient Requires Transmission-Based Precautions: No Implantable device outside of the clinic excluding No Patient Has Alerts: Yes cellular tissue based products placed in the center Patient Alerts: Lasix  since last visit: ABI R Zwolle (07/02/23) Has Dressing in Place as Prescribed: Yes ABI L Anthony (07/02/23) Has Compression in Place as Prescribed: Yes Pain Present Now: No Electronic Signature(s) Signed: 09/27/2023 4:36:15 PM By: Joan Handing RN, BSN Entered By: Joan Joan on 09/27/2023 07:44:43 -------------------------------------------------------------------------------- Compression Therapy Details Patient Name: Date of Service: Joan Joan Joan [name], Joan Joan. 09/27/2023 7:30 Joan M Medical Record Number: [id number] Patient Account Number: [id number] Date of Birth/Sex: Treating RN: 02/14/1966 (58 y.o. Joan Joan Primary Care Joan Joan: PCP, [name]: Referring Joan Joan: Treating Joan Joan/Extender: Joan Joan in  Joan: 12 Compression Therapy Performed for Wound Assessment: Wound #6 Left,Lateral Lower Leg Performed By: Clinician Joan Handing, RN Compression Type: Nonie Seaman Post Procedure Diagnosis Same as Pre-procedure Electronic Signature(s) Signed: 09/27/2023 4:36:15 PM By: Joan, Linda RN, BSN Entered By: Joan Joan on 09/27/2023 08:02:06 Joan Joan ([id number]) [id number] Page 2 of 9 -------------------------------------------------------------------------------- Encounter Discharge Information Details Patient Name: Date of Service: Joan Joan, Joan Joan. 09/27/2023 7:30 Joan M Medical Record Number: [id number] Patient Account Number: [id number] Date of Birth/Sex: Treating RN: [birth year] (57 y.o. Joan Joan Primary Care Sintia Mckissic: PCP, [name]: Referring Joan Joan: Treating Joan Joan/Extender: Joan Joan in Joan: 12 Encounter Discharge Information Items Post Procedure Vitals Discharge Condition: Stable Temperature (F): 98.1 Ambulatory Status: Ambulatory Pulse (bpm): 112 Discharge Destination: Home Respiratory Rate (breaths/min): 18 Transportation: Private Auto Blood Pressure (mmHg): 160/99 Accompanied By: self Schedule Follow-up Appointment: Yes Clinical Summary of Care: Patient Declined Electronic Signature(s) Signed: 09/27/2023 4:36:15 PM By: Joan Handing RN, BSN Entered By: Joan Joan on 09/27/2023 08:33:34 -------------------------------------------------------------------------------- Lower Extremity Assessment Details Patient Name: Date of Service: Joan Joan Joan [name], Joan Joan. 09/27/2023 7:30 Joan M Medical Record Number: [id number] Patient Account Number: [id number] Date of Birth/Sex: Treating RN: October 02, 1965 (58 y.o. Joan Joan Primary Care Danyl Deems: PCP, [name]: Referring Joan Joan: Treating Joan Joan/Extender: Joan Joan in Joan: 12 Edema Assessment Assessed: [Left: No] [Right:  No] Edema: [Left: Ye] [Right: s] Calf Left: Right: Point of Measurement: 32 cm From Medial Instep 43 cm Ankle Left: Right: Point of Measurement: 10 cm From Medial Instep 28.7 cm Knee To Floor Left: Right: From Medial Instep 39 cm Vascular Assessment Pulses: Dorsalis Pedis Palpable: [Left:Yes] Extremity colors, hair growth, and conditions: Extremity Color: [Left:Hyperpigmented] Hair Growth on Extremity: [Left:No] Temperature of Extremity: [Left:Warm] Capillary Refill: [Left:< 3 seconds] Dependent Rubor: [Left:No No] Electronic Signature(s) Signed: 09/27/2023 4:36:15 PM By: Joan Handing RN, BSN Joan Joan ([id number]) [phone].pdf Page  3 of 9 Entered By: Joan Joan on 09/27/2023 07:51:41 -------------------------------------------------------------------------------- Multi Wound Chart Details Patient Name: Date of Service: Joan Joan, Joan Joan. 09/27/2023 7:30 Joan M Medical Record Number: [id number] Patient Account Number: [id number] Date of Birth/Sex: Treating RN: 1965-10-07 (58 y.o. F) Primary Care Joan Joan: PCP, [name]: Referring Joan Joan: Treating Joan Joan/Extender: Joan Joan in Joan: 12 Vital Signs Height(in): 71 Pulse(bpm): 112 Weight(lbs): 352 Blood Pressure(mmHg): 160/99 Body Mass Index(BMI): 49.1 Temperature(F): 98.1 Respiratory Rate(breaths/min): 20 [6:Photos:] [N/Joan:N/Joan] Left, Lateral Lower Leg Left, Medial Lower Leg N/Joan Wound Location: Gradually Appeared Gradually Appeared N/Joan Wounding Event: Lymphedema Venous Leg Ulcer N/Joan Primary Etiology: Anemia, Asthma, Congestive Heart Anemia, Asthma, Congestive Heart N/Joan Comorbid History: Failure, Deep Vein Thrombosis, Failure, Deep Vein Thrombosis, Hypertension, Osteoarthritis Hypertension, Osteoarthritis 08/09/2020 09/06/2023 N/Joan Date Acquired: 12 3 N/Joan Weeks of Joan: Open Healed - Epithelialized N/Joan Wound Status: No No N/Joan Wound  Recurrence: 1.1x0.6x0.3 0x0x0 N/Joan Measurements L x W x D (cm) 0.518 0 N/Joan Joan (cm) : rea 0.156 0 N/Joan Volume (cm) : 85.70% 100.00% N/Joan % Reduction in Joan rea: 85.60% 100.00% N/Joan % Reduction in Volume: Full Thickness Without Exposed Full Thickness Without Exposed N/Joan Classification: Support Structures Support Structures Medium Small N/Joan Exudate Joan mount: Serous Serous N/Joan Exudate Type: media planner N/Joan Exudate Color: Distinct, outline attached N/Joan N/Joan Wound Margin: Large (67-100%) None Present (0%) N/Joan Granulation Joan mount: Red, Pink N/Joan N/Joan Granulation Quality: Small (1-33%) None Present (0%) N/Joan Necrotic Joan mount: Fat Layer (Subcutaneous Tissue): Yes Fascia: No N/Joan Exposed Structures: Fascia: No Fat Layer (Subcutaneous Tissue): No Tendon: No Tendon: No Muscle: No Muscle: No Joint: No Joint: No Bone: No Bone: No Small (1-33%) Large (67-100%) N/Joan Epithelialization: Debridement - Excisional N/Joan N/Joan Debridement: Pre-procedure Verification/Time Out 08:05 N/Joan N/Joan Taken: Lidocaine 4% Topical Solution N/Joan N/Joan Pain Control: Subcutaneous, Slough N/Joan N/Joan Tissue Debrided: Skin/Subcutaneous Tissue N/Joan N/Joan Level: 0.52 N/Joan N/Joan Debridement Joan (sq cm): rea Curette N/Joan N/Joan Instrument: Minimum N/Joan N/Joan Bleeding: Pressure N/Joan N/Joan Hemostasis Joan chieved: 0 N/Joan N/Joan Procedural Pain: 0 N/Joan N/Joan Post Procedural PainLATONYIA, LOPATA Joan ([id number]) [id number] Page 4 of 9 Procedure was tolerated well N/Joan N/Joan Debridement Joan Response: 1.1x0.6x0.3 N/Joan N/Joan Post Debridement Measurements L x W x D (cm) 0.156 N/Joan N/Joan Post Debridement Volume: (cm) Scarring: Yes No Abnormalities Noted N/Joan Periwound Skin Texture: No Abnormalities Noted No Abnormalities Noted N/Joan Periwound Skin Moisture: Hemosiderin Staining: Yes No Abnormalities Noted N/Joan Periwound Skin Color: No Abnormality No Abnormality N/Joan Temperature: Yes N/Joan N/Joan Tenderness on Palpation: Cellular  or Tissue Based Product N/Joan N/Joan Procedures Performed: Compression Therapy Debridement Joan Notes Electronic Signature(s) Signed: 09/27/2023 8:23:15 AM By: Joan Delon MD FACS Entered By: Joan Delon on 09/27/2023 08:23:15 -------------------------------------------------------------------------------- Multi-Disciplinary Care Plan Details Patient Name: Date of Service: Joan Joan Joan [name], Joan Joan. 09/27/2023 7:30 Joan M Medical Record Number: [id number] Patient Account Number: [id number] Date of Birth/Sex: Treating RN: 08-26-1966 (58 y.o. Joan Joan Primary Care Deron Poole: PCP, [name]: Referring Diallo Ponder: Treating Cieanna Stormes/Extender: Joan Joan in Joan: 12 Multidisciplinary Care Plan reviewed with physician Active Inactive Venous Leg Ulcer Nursing Diagnoses: Actual venous Insuffiency (use after diagnosis is confirmed) Knowledge deficit related to disease process and management Goals: Patient will maintain optimal edema control Date Initiated: 08/29/2023 Target Resolution Date: 10/25/2023 Goal Status: Active Interventions: Assess peripheral edema status every visit. Compression as ordered Provide education on venous insufficiency Joan Activities: Therapeutic compression applied : 08/29/2023 Notes: Wound/Skin Impairment Nursing  Diagnoses: Impaired tissue integrity Goals: Patient/caregiver will verbalize understanding of skin care regimen Date Initiated: 07/02/2023 Target Resolution Date: 10/25/2023 Goal Status: Active Interventions: Assess ulceration(s) every visit Joan Activities: Joan Joan, Joan Joan ([id number]) [phone].pdf Page 5 of 9 Skin care regimen initiated : 07/02/2023 Notes: Electronic Signature(s) Signed: 09/27/2023 4:36:15 PM By: Joan Handing RN, BSN Entered By: Joan Joan on 09/27/2023 08:01:06 -------------------------------------------------------------------------------- Pain Assessment  Details Patient Name: Date of Service: Joan Joan Joan [name], Joan Joan. 09/27/2023 7:30 Joan M Medical Record Number: [id number] Patient Account Number: [id number] Date of Birth/Sex: Treating RN: 05-02-1966 (58 y.o. Joan Joan Primary Care Chicquita Mendel: PCP, [name]: Referring Erwin Nishiyama: Treating Emoni Yang/Extender: Joan Joan in Joan: 12 Active Problems Location of Pain Severity and Description of Pain Patient Has Paino No Site Locations Rate the pain. Current Pain Level: 0 Pain Management and Medication Current Pain Management: Electronic Signature(s) Signed: 09/27/2023 4:36:15 PM By: Joan Handing RN, BSN Entered By: Joan Joan on 09/27/2023 07:47:39 -------------------------------------------------------------------------------- Patient/Caregiver Education Details Patient Name: Date of Service: Joan Joan Joan [name], Joan Joan. 1/2/2025andnbsp7:30 Joan M Medical Record Number: [id number] Patient Account Number: [id number] Date of Birth/Gender: Treating RN: 02-17-1966 (58 y.o. Joan Joan Primary Care Physician: PCP, [name]: Referring Physician: Treating Physician/Extender: Joan Joan in Joan: [address] Joan Joan, Joan Joan ([id number]) [id number] Page 6 of 9 Education Provided To: Patient Education Topics Provided Venous: Methods: Explain/Verbal Responses: Reinforcements needed, State content correctly Wound/Skin Impairment: Methods: Explain/Verbal Responses: Reinforcements needed, State content correctly Electronic Signature(s) Signed: 09/27/2023 4:36:15 PM By: Joan Handing RN, BSN Entered By: Joan Joan on 09/27/2023 08:01:37 -------------------------------------------------------------------------------- Wound Assessment Details Patient Name: Date of Service: Joan Joan Joan [name], Joan Joan. 09/27/2023 7:30 Joan M Medical Record Number: [id number] Patient Account Number: [id number] Date of Birth/Sex: Treating  RN: [birth year] (58 y.o. Joan Joan Primary Care Kebra Lowrimore: PCP, [name]: Referring Price Lachapelle: Treating Riverlyn Kizziah/Extender: Joan Joan in Joan: 12 Wound Status Wound Number: 6 Primary Lymphedema Etiology: Wound Location: Left, Lateral Lower Leg Wound Open Wounding Event: Gradually Appeared Status: Date Acquired: 08/09/2020 Comorbid Anemia, Asthma, Congestive Heart Failure, Deep Vein Weeks Of Joan: 12 History: Thrombosis, Hypertension, Osteoarthritis Clustered Wound: No Photos Wound Measurements Length: (cm) 1.1 Width: (cm) 0.6 Depth: (cm) 0.3 Area: (cm) 0.518 Volume: (cm) 0.156 % Reduction in Area: 85.7% % Reduction in Volume: 85.6% Epithelialization: Small (1-33%) Tunneling: No Undermining: No Wound Description Classification: Full Thickness Without Exposed Support Wound Margin: Distinct, outline attached Exudate Amount: Medium Exudate Type: Serous Exudate Color: amber Structures Foul Odor After Cleansing: No Slough/Fibrino Yes Wound Bed Joan Joan, Joan Joan ([id number]) [id number] Page 7 of 9 Granulation Amount: Large (67-100%) Exposed Structure Granulation Quality: Red, Pink Fascia Exposed: No Necrotic Amount: Small (1-33%) Fat Layer (Subcutaneous Tissue) Exposed: Yes Necrotic Quality: Adherent Slough Tendon Exposed: No Muscle Exposed: No Joint Exposed: No Bone Exposed: No Periwound Skin Texture Texture Color No Abnormalities Noted: Yes No Abnormalities Noted: No Hemosiderin Staining: Yes Moisture No Abnormalities Noted: Yes Temperature / Pain Temperature: No Abnormality Tenderness on Palpation: Yes Joan Notes Wound #6 (Lower Leg) Wound Laterality: Left, Lateral Cleanser Peri-Wound Care Sween Lotion (Moisturizing lotion) Discharge Instruction: Apply moisturizing lotion as directed Topical Primary Dressing ADAPTIC TOUCH 3x4.25 (in/in) Discharge Instruction: Apply to wound bed as  instructed Hydrofera Blue Ready Transfer Foam, 2.5x2.5 (in/in) Discharge Instruction: Apply directly to wound bed as directed Epifix Secondary Dressing Woven Gauze Sponge, Non-Sterile 4x4 in Discharge Instruction: Apply over primary dressing as directed. Secured With Compression Wrap Unnaboot  w/Calamine, 4x10 (in/yd) Discharge Instruction: Apply Unnaboot as directed. Compression Stockings Add-Ons Electronic Signature(s) Signed: 09/27/2023 4:36:15 PM By: Joan, Linda RN, BSN Entered By: Joan Joan on 09/27/2023 07:59:34 -------------------------------------------------------------------------------- Wound Assessment Details Patient Name: Date of Service: Joan Joan Joan [name], Joan Joan. 09/27/2023 7:30 Joan M Medical Record Number: [id number] Patient Account Number: [id number] Date of Birth/Sex: Treating RN: 1966-07-27 (58 y.o. Joan Joan Primary Care Chey Rachels: PCP, [name]: Referring Lorma Heater: Treating Jurnee Nakayama/Extender: Joan Joan in Joan: 12 Wound Status Wound Number: 7 Primary Venous Leg Ulcer Etiology: Wound Location: Left, Medial Lower Leg Wound Healed - Epithelialized Wounding Event: Gradually Appeared Status: Date Acquired: 09/06/2023 Comorbid Anemia, Asthma, Congestive Heart Failure, Deep Vein Weeks Of Joan: 3 MARLISS, BUTTACAVOLI Joan ([id number]) [id number] Page 8 of 9 Weeks Of Joan: 3 History: Thrombosis, Hypertension, Osteoarthritis Clustered Wound: No Photos Wound Measurements Length: (cm) Width: (cm) Depth: (cm) Area: (cm) Volume: (cm) 0 % Reduction in Area: 100% 0 % Reduction in Volume: 100% 0 Epithelialization: Large (67-100%) 0 Tunneling: No 0 Undermining: No Wound Description Classification: Full Thickness Without Exposed Support Structures Exudate Amount: Small Exudate Type: Serous Exudate Color: amber Foul Odor After Cleansing: No Slough/Fibrino No Wound Bed Granulation Amount: None Present  (0%) Exposed Structure Necrotic Amount: None Present (0%) Fascia Exposed: No Fat Layer (Subcutaneous Tissue) Exposed: No Tendon Exposed: No Muscle Exposed: No Joint Exposed: No Bone Exposed: No Periwound Skin Texture Texture Color No Abnormalities Noted: Yes No Abnormalities Noted: Yes Moisture Temperature / Pain No Abnormalities Noted: Yes Temperature: No Abnormality Electronic Signature(s) Signed: 09/27/2023 4:36:15 PM By: Joan Handing RN, BSN Entered By: Joan Joan on 09/27/2023 08:00:23 -------------------------------------------------------------------------------- Vitals Details Patient Name: Date of Service: Joan Joan Joan [name], Joan Joan. 09/27/2023 7:30 Joan M Medical Record Number: [id number] Patient Account Number: [id number] Date of Birth/Sex: Treating RN: 03-06-1966 (58 y.o. Joan Joan Primary Care Lanier Felty: PCP, [name]: Referring Loreda Silverio: Treating Violett Hobbs/Extender: Joan Joan in Joan: 12 Vital Signs Time Taken: 07:46 Temperature (F): 98.1 Height (in): 71 Pulse (bpm): 112 Weight (lbs): 352 Respiratory Rate (breaths/min): 20 Body Mass Index (BMI): 49.1 Blood Pressure (mmHg): 160/99 Reference Range: 80 - 120 mg / dl HEILY, CARLUCCI Joan ([id number]) [id number] Page 9 of 9 Electronic Signature(s) Signed: 09/27/2023 4:36:15 PM By: Joan Handing RN, BSN Entered By: Joan Joan on 09/27/2023 07:47:26

## 2023-09-28 NOTE — Progress Notes (Signed)
 [name], [name] ([id number]) [id number] Page 1 of 9 Visit Report for 09/27/2023 Chief Complaint Document Details Patient Name: Date of Service: [name] [name], [name] 09/27/2023 7:30 [name] Medical Record Number: [id number] Patient Account Number: [id number] Date of Birth/Sex: Treating RN: 02-24-1966 (58 y.o. F) Primary Care Provider: PCP, [name]: Referring Provider: Treating Provider/Extender: [name] Duos in Treatment: 12 Information Obtained from: Patient Chief Complaint Patient presents for treatment of open ulcers due to lymphedema Electronic Signature(s) Signed: 09/27/2023 8:23:37 AM By: Joan Delon MD FACS Entered By: [name] on 09/27/2023 08:23:37 -------------------------------------------------------------------------------- Cellular or Tissue Based Product Details Patient Name: Date of Service: Joan Joan [name], [name] 09/27/2023 7:30 [name] Medical Record Number: [id number] Patient Account Number: [id number] Date of Birth/Sex: Treating RN: Mar 06, 1966 (58 y.o. [name] Primary Care Provider: PCP, [name]: Referring Provider: Treating Provider/Extender: [name] Duos in Treatment: 12 Cellular or Tissue Based Product Type Wound #6 Left,Lateral Lower Leg Applied to: Performed By: Physician Joan Delon, MD The following information was scribed by: Merleen [name] The information was scribed for: [name] Cellular or Tissue Based Product Type: Epifix Level of Consciousness (Pre-procedure): Awake and Alert Pre-procedure Verification/Time Out Yes - 08:05 Taken: Location: trunk / arms / legs Wound Size (sq cm): 0.66 Product Size (sq cm): 3 Waste Size (sq cm): 0 Amount of Product Applied (sq cm): 3 Instrument Used: Forceps, Scissors Lot #: [id number] Order #: [id number] Expiration Date: 04/25/2028 Fenestrated: No Reconstituted: Yes Solution Type: saline Solution Amount: 1 ml Lot #:  [id number] Solution Expiration Date: 01/13/2025 Secured: Yes Secured With: Steri-Strips Dressing Applied: Yes Primary Dressing: adaptic, hydofera blue Procedural Pain: 0 Post Procedural Pain: 0 [name] [name] ([id number]) [id number] Page 2 of 9 Response to Treatment: Procedure was tolerated well Level of Consciousness (Post- Awake and Alert procedure): Post Procedure Diagnosis Same as Pre-procedure Electronic Signature(s) Signed: 09/27/2023 8:44:52 AM By: Joan Delon MD FACS Signed: 09/27/2023 4:36:15 PM By: Merleen Handing RN, BSN Entered By: Merleen [name] on 09/27/2023 08:14:42 -------------------------------------------------------------------------------- Debridement Details Patient Name: Date of Service: Joan Joan [name], [name] 09/27/2023 7:30 [name] Medical Record Number: [id number] Patient Account Number: [id number] Date of Birth/Sex: Treating RN: 04/28/1966 (58 y.o. [name] Primary Care Provider: PCP, [name]: Referring Provider: Treating Provider/Extender: [name] Duos in Treatment: 12 Debridement Performed for Assessment: Wound #6 Left,Lateral Lower Leg Performed By: Physician Joan Delon, MD The following information was scribed by: Merleen [name] The information was scribed for: [name] Debridement Type: Debridement Level of Consciousness (Pre-procedure): Awake and Alert Pre-procedure Verification/Time Out Yes - 08:05 Taken: Start Time: 08:07 Pain Control: Lidocaine 4% T opical Solution Percent of Wound Bed Debrided: 100% T Area Debrided (cm): otal 0.52 Tissue and other material debrided: Viable, Non-Viable, Slough, Subcutaneous, Slough Level: Skin/Subcutaneous Tissue Debridement Description: Excisional Instrument: Curette Bleeding: Minimum Hemostasis Achieved: Pressure Procedural Pain: 0 Post Procedural Pain: 0 Response to Treatment: Procedure was tolerated well Level of Consciousness (Post-  Awake and Alert procedure): Post Debridement Measurements of Total Wound Length: (cm) 1.1 Width: (cm) 0.6 Depth: (cm) 0.3 Volume: (cm) 0.156 Character of Wound/Ulcer Post Debridement: Improved Post Procedure Diagnosis Same as Pre-procedure Electronic Signature(s) Signed: 09/27/2023 8:44:52 AM By: Joan Delon MD FACS Signed: 09/27/2023 4:36:15 PM By: Merleen Handing RN, BSN Entered By: Merleen [name] on 09/27/2023 08:10:49 [name] ([id number]) [id number] Page 3 of 9 -------------------------------------------------------------------------------- HPI Details Patient Name: Date of Service: [name], [name] 09/27/2023  7:30 [name] Medical Record Number: [id number] Patient Account Number: [id number] Date of Birth/Sex: Treating RN: 09-13-1966 (58 y.o. F) Primary Care Provider: PCP, [name]: Referring Provider: Treating Provider/Extender: [name] Duos in Treatment: 12 History of Present Illness HPI Description: ADMISSION 07/02/2023 ***ABIs non-compressible bilaterally; triphasic Doppler signals*** This is [name] 58 year old morbidly obese patient who is not [name] diabetic. She does have hypertension and CHF. Although she does not carry this diagnosis in her electronic medical record, she clearly has lymphedema. She has multiple wounds on her bilateral lower extremities, 1 of which has been present for 3 years. She has been treating her wounds with peroxide and bacitracin. Her provider has given her various courses of antibiotics, but the wounds have not closed. She was told not to wear compression stockings due to her history of DVT but this was well over [name] year ago. She has never worn lymphedema pumps. , 07/09/2023: Several of the smaller wounds have closed. Remaining wounds include the left proximal medial ulcer and the left lateral distal ulcer, as well as the paired wounds on her right lateral lower leg. There is slough accumulation at all  sites. 07/16/2023: The left distal lateral leg ulcer is [name] little bit smaller and quite [name] bit cleaner. The more proximal medial left lower leg ulcer still has [name] fair amount of nonviable tissue present and this more tender. Her wrap did slide down and it appears that it came to rest right in the middle of this wound causing some pressure on the site. The paired right lateral lower leg ulcers are small and superficial with [name] little bit of slough and eschar present. 07/23/2023: The right lateral lower leg ulcers are nearly closed but remain just barely open underneath [name] layer of eschar. The proximal medial left lower leg ulcer has less nonviable tissue present today and is smaller. The left distal lateral leg ulcer is unchanged in size, but the surface is clean and the quality of the tissue is improving. 07/30/2023: Both right leg ulcers are healed. The proximal medial left leg ulcer is smaller and more superficial. The left distal lateral leg ulcer is still unchanged in size. There is more granulation tissue filling in, but the surface is still fairly fibrotic. Edema control is adequate. 08/07/2023: She unfortunately had to travel out of town urgently to go to [place] this past week and as [name] result, was on her feet and with her legs in [name] dependent position quite [name] bit. This has resulted in the medial leg ulcer getting [name] little bit larger and the lateral leg ulcer getting [name] little bit deeper. Her juxta lite stockings were delivered and she has them with her today. She has been approved for Epifix so we will proceed with application today. 08/13/2023: The medial leg ulcer is quite [name] bit smaller this week and the lateral leg ulcer also measured slightly smaller but the tissue surface is healthier- looking. She has been on her feet again [name] lot this past week so her edema control remains poor. 08/20/2023: The medial leg ulcer is nearly healed with just [name] tiny open area remaining underneath [name] layer of  eschar. The lateral leg ulcer is unchanged and the surface remains [name] bit fibrotic. Edema control is better this week. 12/3; the medial ulcer is healed. On the left lateral we still have [name] punched-out area with roughly 0.4 cm in depth. The granulation tissue at the base of this looks healthy. EpiFix reapplied in the standard fashion. 12/12;  the patient's medial left leg ulcer was healed however her Unna boot fell down this week and she has [name] new wound on the upper medial lower leg in the setting of uncontrolled swelling. The wound that we she had last week where we are putting epi fix is about the same but the swelling is just too much. She did not come in with any stocking on the right leg 09/14/2023: The medial leg ulcer is [name] little bit smaller today. The lateral leg ulcer continues to contract. There is still some depth to it and [name] thick layer of slough has accumulated. 09/27/2023: The medial leg ulcer is healed. The lateral leg ulcer is smaller today. There is still some depth to it but not much slough accumulation. Edema control is significantly improved. She is wearing her compression stocking on her right leg. Electronic Signature(s) Signed: 09/27/2023 8:24:20 AM By: Joan Nest MD FACS Entered By: Joan Nest on 09/27/2023 08:24:20 -------------------------------------------------------------------------------- Physical Exam Details Patient Name: Date of Service: Joan Joan [name], [name] 09/27/2023 7:30 [name] Medical Record Number: [id number] Patient Account Number: [id number] Date of Birth/Sex: Treating RN: 1965/10/03 (58 y.o. F) Primary Care Provider: PCP, [name]: Referring Provider: Treating Provider/Extender: [name], [name], [name] ([id number]) [id number] Page 4 of 9 Weeks in Treatment: 12 Constitutional Hypertensive, asymptomatic. Tachycardic, asymptomatic. . . no acute distress. Respiratory Normal work of breathing on room  air.. Notes 09/27/2023: The medial leg ulcer is healed. The lateral leg ulcer is smaller today. There is still some depth to it but not much slough accumulation. Edema control is significantly improved. Electronic Signature(s) Signed: 09/27/2023 8:25:18 AM By: Joan Nest MD FACS Entered By: Joan Nest on 09/27/2023 08:25:18 -------------------------------------------------------------------------------- Physician Orders Details Patient Name: Date of Service: Joan Joan [name], [name] 09/27/2023 7:30 [name] Medical Record Number: [id number] Patient Account Number: [id number] Date of Birth/Sex: Treating RN: 05-25-1966 (58 y.o. [name] Primary Care Provider: PCP, [name]: Referring Provider: Treating Provider/Extender: Joan Nest Duos in Treatment: 12 The following information was scribed by: Merleen [name] The information was scribed for: Joan Nest Verbal / Phone Orders: No Diagnosis Coding ICD-10 Coding Code Description [phone] 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osed I87.319 Chronic venous hypertension (idiopathic) with ulcer of unspecified lower extremity I89.0 Lymphedema, not elsewhere classified I50.32 Chronic diastolic (congestive) heart failure E66.01 Morbid (severe) obesity due to excess calories Follow-up Appointments ppointment in 1 week. - Dr. Marolyn Return [name] 1/8 @ 09:30 am Anesthetic (In clinic) Topical Lidocaine 4% applied to wound bed Cellular or Tissue Based Products Wound #6 Left,Lateral Lower Leg Cellular or Tissue Based Product Type: - Epifix #7 Cellular or Tissue Based Product applied to wound bed, secured with steri-strips, cover with Adaptic or Mepitel. (DO NOT REMOVE). Bathing/ Shower/ Hygiene May shower with protection but do not get wound dressing(s) wet. Protect dressing(s) with water repellant cover (for example, large plastic bag) or [name] cast cover and may then take shower. - Keep  legs dry until next appointment. Use Cast Protectors if so desired. Other Bathing/Shower/Hygiene Orders/Instructions: - May want to sponge bath if legs cannot be kept dry until next appointment. Additional Orders / Instructions Follow Nutritious Diet - Try and increase Protein intake. The goal is 70g-100g per day. Examples are chicken, fish, meat, pork, Greek yogurt, eggs etc. Lymphedema Treatment Plan - Exercise, Compression and Elevation Exercise daily as tolerated. (Walking, ROM, Calf Pumps and Toe Taps) Compression Wraps  as ordered Elevate legs 30 - 60 minutes at or above heart level at least 3 - 4 times daily as able/tolerated Avoid standing for long periods and elevate leg(s) parallel to the floor when sitting Use Pneumatic Compression Device on leg(s) 2-3 times [name] day for 45-60 minutes - grade 2 lymphedema with skin changes and chronic edema [name], [name] ([id number]) [phone].pdf Page 5 of 9 Wound Treatment Wound #6 - Lower Leg Wound Laterality: Left, Lateral Peri-Wound Care: Sween Lotion (Moisturizing lotion) 1 x Per Week Discharge Instructions: Apply moisturizing lotion as directed Prim Dressing: ADAPTIC TOUCH 3x4.25 (in/in) 1 x Per Week ary Discharge Instructions: Apply to wound bed as instructed Prim Dressing: Hydrofera Blue Ready Transfer Foam, 2.5x2.5 (in/in) 1 x Per Week ary Discharge Instructions: Apply directly to wound bed as directed Prim Dressing: Epifix ary 1 x Per Week Secondary Dressing: Woven Gauze Sponge, Non-Sterile 4x4 in 1 x Per Week Discharge Instructions: Apply over primary dressing as directed. Compression Wrap: Unnaboot w/Calamine, 4x10 (in/yd) 1 x Per Week Discharge Instructions: Apply Unnaboot as directed. Electronic Signature(s) Signed: 09/27/2023 8:44:52 AM By: Joan Nest MD FACS Entered By: Joan Nest on 09/27/2023 08:25:44 -------------------------------------------------------------------------------- Problem  List Details Patient Name: Date of Service: Joan Joan [name], [name] 09/27/2023 7:30 [name] Medical Record Number: [id number] Patient Account Number: [id number] Date of Birth/Sex: Treating RN: 30-Jun-1966 (58 y.o. [name] Primary Care Provider: PCP, [name]: Referring Provider: Treating Provider/Extender: Joan Nest Duos in Treatment: 12 Active Problems ICD-10 Encounter Code Description Active Date MDM Diagnosis L97.822 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osed10/03/2023 No Yes I87.319 Chronic venous hypertension (idiopathic) with ulcer of unspecified lower 07/02/2023 No Yes extremity I89.0 Lymphedema, not elsewhere classified 07/02/2023 No Yes I50.32 Chronic diastolic (congestive) heart failure 07/02/2023 No Yes E66.01 Morbid (severe) obesity due to excess calories 07/02/2023 No Yes Inactive Problems Resolved Problems ICD-10 Code Description Active Date Resolved Date VANNAH, NADAL ([id number]) [phone].pdf Page 6 of 9 [phone] Non-pressure chronic ulcer of other part of right lower leg with fat layer exposed 07/02/2023 07/02/2023 Electronic Signature(s) Signed: 09/27/2023 8:23:05 AM By: Joan Nest MD FACS Entered By: Joan Nest on 09/27/2023 08:23:05 -------------------------------------------------------------------------------- Progress Note Details Patient Name: Date of Service: Joan Joan [name], [name] 09/27/2023 7:30 [name] Medical Record Number: [id number] Patient Account Number: [id number] Date of Birth/Sex: Treating RN: 1966/07/23 (58 y.o. F) Primary Care Provider: PCP, [name]: Referring Provider: Treating Provider/Extender: Joan Nest Duos in Treatment: 12 Subjective Chief Complaint Information obtained from Patient Patient presents for treatment of open ulcers due to lymphedema History of Present Illness (HPI) ADMISSION 07/02/2023 ***ABIs non-compressible bilaterally;  triphasic Doppler signals*** This is [name] 58 year old morbidly obese patient who is not [name] diabetic. She does have hypertension and CHF. Although she does not carry this diagnosis in her electronic medical record, she clearly has lymphedema. She has multiple wounds on her bilateral lower extremities, 1 of which has been present for 3 years. She has been treating her wounds with peroxide and bacitracin. Her provider has given her various courses of antibiotics, but the wounds have not closed. She was told not to wear compression stockings due to her history of DVT but this was well over [name] year ago. She has never worn lymphedema pumps. , 07/09/2023: Several of the smaller wounds have closed. Remaining wounds include the left proximal medial ulcer and the left lateral distal ulcer, as well as the paired wounds on her  right lateral lower leg. There is slough accumulation at all sites. 07/16/2023: The left distal lateral leg ulcer is [name] little bit smaller and quite [name] bit cleaner. The more proximal medial left lower leg ulcer still has [name] fair amount of nonviable tissue present and this more tender. Her wrap did slide down and it appears that it came to rest right in the middle of this wound causing some pressure on the site. The paired right lateral lower leg ulcers are small and superficial with [name] little bit of slough and eschar present. 07/23/2023: The right lateral lower leg ulcers are nearly closed but remain just barely open underneath [name] layer of eschar. The proximal medial left lower leg ulcer has less nonviable tissue present today and is smaller. The left distal lateral leg ulcer is unchanged in size, but the surface is clean and the quality of the tissue is improving. 07/30/2023: Both right leg ulcers are healed. The proximal medial left leg ulcer is smaller and more superficial. The left distal lateral leg ulcer is still unchanged in size. There is more granulation tissue filling in, but the surface is  still fairly fibrotic. Edema control is adequate. 08/07/2023: She unfortunately had to travel out of town urgently to go to [place] this past week and as [name] result, was on her feet and with her legs in [name] dependent position quite [name] bit. This has resulted in the medial leg ulcer getting [name] little bit larger and the lateral leg ulcer getting [name] little bit deeper. Her juxta lite stockings were delivered and she has them with her today. She has been approved for Epifix so we will proceed with application today. 08/13/2023: The medial leg ulcer is quite [name] bit smaller this week and the lateral leg ulcer also measured slightly smaller but the tissue surface is healthier- looking. She has been on her feet again [name] lot this past week so her edema control remains poor. 08/20/2023: The medial leg ulcer is nearly healed with just [name] tiny open area remaining underneath [name] layer of eschar. The lateral leg ulcer is unchanged and the surface remains [name] bit fibrotic. Edema control is better this week. 12/3; the medial ulcer is healed. On the left lateral we still have [name] punched-out area with roughly 0.4 cm in depth. The granulation tissue at the base of this looks healthy. EpiFix reapplied in the standard fashion. 12/12; the patient's medial left leg ulcer was healed however her Unna boot fell down this week and she has [name] new wound on the upper medial lower leg in the setting of uncontrolled swelling. The wound that we she had last week where we are putting epi fix is about the same but the swelling is just too much. She did not come in with any stocking on the right leg 09/14/2023: The medial leg ulcer is [name] little bit smaller today. The lateral leg ulcer continues to contract. There is still some depth to it and [name] thick layer of slough has accumulated. 09/27/2023: The medial leg ulcer is healed. The lateral leg ulcer is smaller today. There is still some depth to it but not much slough accumulation. Edema control is  significantly improved. She is wearing her compression stocking on her right leg. [name], [name] ([id number]) [id number] Page 7 of 9 Objective Constitutional Hypertensive, asymptomatic. Tachycardic, asymptomatic. no acute distress. Vitals Time Taken: 7:46 AM, Height: 71 in, Weight: 352 lbs, BMI: 49.1, Temperature: 98.1 F, Pulse: 112 bpm, Respiratory Rate: 20 breaths/min, Blood  Pressure: 160/99 mmHg. Respiratory Normal work of breathing on room air.. General Notes: 09/27/2023: The medial leg ulcer is healed. The lateral leg ulcer is smaller today. There is still some depth to it but not much slough accumulation. Edema control is significantly improved. Integumentary (Hair, Skin) Wound #6 status is Open. Original cause of wound was Gradually Appeared. The date acquired was: 08/09/2020. The wound has been in treatment 12 weeks. The wound is located on the Left,Lateral Lower Leg. The wound measures 1.1cm length x 0.6cm width x 0.3cm depth; 0.518cm^2 area and 0.156cm^3 volume. There is Fat Layer (Subcutaneous Tissue) exposed. There is no tunneling or undermining noted. There is [name] medium amount of serous drainage noted. The wound margin is distinct with the outline attached to the wound base. There is large (67-100%) red, pink granulation within the wound bed. There is [name] small (1- 33%) amount of necrotic tissue within the wound bed including Adherent Slough. The periwound skin appearance had no abnormalities noted for texture. The periwound skin appearance had no abnormalities noted for moisture. The periwound skin appearance exhibited: Hemosiderin Staining. Periwound temperature was noted as No Abnormality. The periwound has tenderness on palpation. Wound #7 status is Healed - Epithelialized. Original cause of wound was Gradually Appeared. The date acquired was: 09/06/2023. The wound has been in treatment 3 weeks. The wound is located on the Left,Medial Lower Leg. The wound  measures 0cm length x 0cm width x 0cm depth; 0cm^2 area and 0cm^3 volume. There is no tunneling or undermining noted. There is [name] small amount of serous drainage noted. There is no granulation within the wound bed. There is no necrotic tissue within the wound bed. The periwound skin appearance had no abnormalities noted for texture. The periwound skin appearance had no abnormalities noted for moisture. The periwound skin appearance had no abnormalities noted for color. Periwound temperature was noted as No Abnormality. Assessment Active Problems ICD-10 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osed Chronic venous hypertension (idiopathic) with ulcer of unspecified lower extremity Lymphedema, not elsewhere classified Chronic diastolic (congestive) heart failure Morbid (severe) obesity due to excess calories Procedures Wound #6 Pre-procedure diagnosis of Wound #6 is [name] Lymphedema located on the Left,Lateral Lower Leg . There was [name] Excisional Skin/Subcutaneous Tissue Debridement with [name] total area of 0.52 sq cm performed by Joan Nest, MD. With the following instrument(s): Curette to remove Viable and Non-Viable tissue/material. Material removed includes Subcutaneous Tissue and Slough and after achieving pain control using Lidocaine 4% T opical Solution. No specimens were taken. [name] time out was conducted at 08:05, prior to the start of the procedure. [name] Minimum amount of bleeding was controlled with Pressure. The procedure was tolerated well with [name] pain level of 0 throughout and [name] pain level of 0 following the procedure. Post Debridement Measurements: 1.1cm length x 0.6cm width x 0.3cm depth; 0.156cm^3 volume. Character of Wound/Ulcer Post Debridement is improved. Post procedure Diagnosis Wound #6: Same as Pre-Procedure Pre-procedure diagnosis of Wound #6 is [name] Lymphedema located on the Left,Lateral Lower Leg. [name] skin graft procedure using [name] bioengineered  skin substitute/cellular or tissue based product was performed by Joan Nest, MD with the following instrument(s): Forceps and Scissors. Epifix was applied and secured with Steri-Strips. 3 sq cm of product was utilized and 0 sq cm was wasted. Post Application, adaptic, hydofera blue was applied. [name] Time Out was conducted at 08:05, prior to the start of the procedure. The procedure was tolerated well with [name]  pain level of 0 throughout and [name] pain level of 0 following the procedure. Post procedure Diagnosis Wound #6: Same as Pre-Procedure . Pre-procedure diagnosis of Wound #6 is [name] Lymphedema located on the Left,Lateral Lower Leg . There was [name] Radio Broadcast Assistant Compression Therapy Procedure by Boehlein, Linda, RN. Post procedure Diagnosis Wound #6: Same as Pre-Procedure Plan Follow-up Appointments: Return Appointment in 1 week. - Dr. Marolyn 1/8 @ 09:30 am Anesthetic: (In clinic) Topical Lidocaine 4% applied to wound bed [name], [name] ([id number]) [id number].pdf Page 8 of 9 Cellular or Tissue Based Products: Wound #6 Left,Lateral Lower Leg: Cellular or Tissue Based Product Type: - Epifix #7 Cellular or Tissue Based Product applied to wound bed, secured with steri-strips, cover with Adaptic or Mepitel. (DO NOT REMOVE). Bathing/ Shower/ Hygiene: May shower with protection but do not get wound dressing(s) wet. Protect dressing(s) with water repellant cover (for example, large plastic bag) or [name] cast cover and may then take shower. - Keep legs dry until next appointment. Use Cast Protectors if so desired. Other Bathing/Shower/Hygiene Orders/Instructions: - May want to sponge bath if legs cannot be kept dry until next appointment. Additional Orders / Instructions: Follow Nutritious Diet - Try and increase Protein intake. The goal is 70g-100g per day. Examples are chicken, fish, meat, pork, Greek yogurt, eggs etc. Lymphedema Treatment Plan - Exercise, Compression and  Elevation: Exercise daily as tolerated. 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 WOUND #6: - Lower Leg Wound Laterality: Left, Lateral Peri-Wound Care: Sween Lotion (Moisturizing lotion) 1 x Per Week/ Discharge Instructions: Apply moisturizing lotion as directed Prim Dressing: ADAPTIC TOUCH 3x4.25 (in/in) 1 x Per Week/ ary Discharge Instructions: Apply to wound bed as instructed Prim Dressing: Hydrofera Blue Ready Transfer Foam, 2.5x2.5 (in/in) 1 x Per Week/ ary Discharge Instructions: Apply directly to wound bed as directed Prim Dressing: Epifix 1 x Per Week/ ary Secondary Dressing: Woven Gauze Sponge, Non-Sterile 4x4 in 1 x Per Week/ Discharge Instructions: Apply over primary dressing as directed. Com pression Wrap: Unnaboot w/Calamine, 4x10 (in/yd) 1 x Per Week/ Discharge Instructions: Apply Unnaboot as directed. 09/27/2023: The medial leg ulcer is healed. The lateral leg ulcer is smaller today. There is still some depth to it but not much slough accumulation. Edema control is significantly improved. She is wearing her compression stocking on her right leg. I used [name] curette to debride slough and subcutaneous tissue from the lateral leg ulcer. EpiFix was then applied in standard fashion using Hydrofera Blue as [name] bolster. It was secured in place with Adaptic and Steri-Strips. [name] 4-layer compression/equivalent wrap was then applied. Given this patient's stage II lymphedema and limited response to standard compression stockings, I think she would benefit from long-term use of lymphedema pumps. Her morbid obesity and other comorbidities make it extremely  difficult for her to exercise. We will start the process to obtain pumps for her going forward. Follow-up in 1 week. Electronic Signature(s) Signed: 09/27/2023 8:27:19 AM By: Joan Nest MD FACS Entered By: Joan Nest on 09/27/2023 08:27:19 -------------------------------------------------------------------------------- SuperBill Details Patient Name: Date of Service: Joan Joan [name], [name] 09/27/2023 Medical Record Number: [id number] Patient Account  Number: [id number] Date of Birth/Sex: Treating RN: April 16, 1966 (58 y.o. F) Primary Care Provider: PCP, [name]: Referring Provider: Treating Provider/Extender: [name] Duos in Treatment: 12 Diagnosis Coding ICD-10 Codes Code Description [phone] 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osed I87.319 Chronic venous hypertension (idiopathic) with ulcer of unspecified lower extremity I89.0 Lymphedema, not elsewhere classified I50.32 Chronic diastolic (congestive) heart failure E66.01 Morbid (severe) obesity due to excess calories Facility Procedures : [name], [name] Code: [id number] ARY [name] ([id number]) Description: [phone] Epifix 2cm x 3cm [id number] Modifier: [email or web address] Quantity: 3 f Page 9 of 9 : CPT4 Code: [id number] ICD L9 Description: 40 - SKIN SUB GRAFT TRNK/ARM/LEG -10 Diagnosis Description 7.822 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osed Modifier: 1 Quantity: Physician Procedures : CPT4 Code Description Modifier [id number] - WC PHYS LEVEL 3 - EST PT 25 ICD-10 Diagnosis Description L97.822 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osed I87.319 Chronic venous hypertension (idiopathic) with  ulcer of unspecified lower extremity I89.0 Lymphedema, not elsewhere classified I50.32 Chronic diastolic (congestive) heart failure Quantity: 1 : [id number] - WC PHYS SKIN SUB GRAFT TRNK/ARM/LEG  ICD-10 Diagnosis Description L97.822 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osed Quantity: 1 Electronic Signature(s) Signed: 09/27/2023 8:27:53 AM By: Joan Delon MD FACS Entered By: [name] on 09/27/2023 08:27:53

## 2023-10-03 ENCOUNTER — Encounter (HOSPITAL_BASED_OUTPATIENT_CLINIC_OR_DEPARTMENT_OTHER): Payer: Medicare HMO | Admitting: General Surgery

## 2023-10-03 DIAGNOSIS — I87312 Chronic venous hypertension (idiopathic) with ulcer of left lower extremity: Secondary | ICD-10-CM | POA: Diagnosis not present

## 2023-10-03 NOTE — Progress Notes (Addendum)
 Joan, [name] Joan ([id number]) [id number] Page 1 of 9 Visit Report for 10/03/2023 Chief Complaint Document Details Patient Name: Date of Service: [name], [name] Joan. 10/03/2023 9:30 [name] Medical Record Number: [id number] Patient Account Number: [id number] Date of Birth/Sex: Treating RN: [birth year] (58 y.o. F) Primary Care Provider: PCP, [name]: Referring Provider: Treating Provider/Extender: [name] in Treatment: 13 Information Obtained from: Patient Chief Complaint Patient presents for treatment of open ulcers due to lymphedema Electronic Signature(s) Signed: 10/03/2023 10:23:59 AM By: Joan Delon MD FACS Entered By: [name] on 10/03/2023 07:23:59 -------------------------------------------------------------------------------- Cellular or Tissue Based Product Details Patient Name: Date of Service: [name], [name]. 10/03/2023 9:30 [name] Medical Record Number: [id number] Patient Account Number: [id number] Date of Birth/Sex: Treating RN: 1965/12/25 (58 y.o. [name] Primary Care Provider: PCP, [name]: Referring Provider: Treating Provider/Extender: [name] in Treatment: 13 Cellular or Tissue Based Product Type Wound #6 Left,Lateral Lower Leg Applied to: Performed By: Physician Joan Delon, MD The following information was scribed by: [name] The information was scribed for: [name] Cellular or Tissue Based Product Type: Epifix Level of Consciousness (Pre-procedure): Awake and Alert Pre-procedure Verification/Time Out Yes - 10:15 Taken: Location: trunk / arms / legs Wound Size (sq cm): 0.56 Product Size (sq cm): 1.8 Waste Size (sq cm): 0 Amount of Product Applied (sq cm): 1.8 Instrument Used: Forceps Lot #: [id number] Expiration Date: 04/25/2028 Fenestrated: No Reconstituted: Yes Solution Type: NS Solution Amount: 1ml Lot #: [id number] Solution Expiration  Date: 01/13/2025 Secured: Yes Secured With: Steri-Strips Dressing Applied: Yes Primary Dressing: hydroferablue, adaptic, gauze Procedural Pain: 0 Post Procedural Pain: 0 Response to Treatment: Procedure was tolerated well [name] Joan ([id number]) [phone] Page 2 of 9 Level of Consciousness (Post- Awake and Alert procedure): Post Procedure Diagnosis Same as Pre-procedure Electronic Signature(s) Signed: 10/03/2023 12:32:37 PM By: Joan Delon MD FACS Signed: 10/03/2023 3:11:11 PM By: Joan Sailors RN, BSN Entered By: [name] on 10/03/2023 07:22:23 -------------------------------------------------------------------------------- Debridement Details Patient Name: Date of Service: [name], [name]. 10/03/2023 9:30 [name] Medical Record Number: [id number] Patient Account Number: [id number] Date of Birth/Sex: Treating RN: 04-Nov-1965 (58 y.o. [name] Primary Care Provider: PCP, [name]: Referring Provider: Treating Provider/Extender: [name] in Treatment: 13 Debridement Performed for Assessment: Wound #6 Left,Lateral Lower Leg Performed By: Physician Joan Delon, MD The following information was scribed by: [name] The information was scribed for: [name] Debridement Type: Debridement Level of Consciousness (Pre-procedure): Awake and Alert Pre-procedure Verification/Time Out Yes - 10:15 Taken: Start Time: 10:15 Pain Control: Lidocaine 4% T opical Solution Percent of Wound Bed Debrided: 100% T Area Debrided (cm): otal 0.44 Tissue and other material debrided: Non-Viable, Slough, Subcutaneous, Slough Level: Skin/Subcutaneous Tissue Debridement Description: Excisional Instrument: Curette Bleeding: Minimum Hemostasis Achieved: Pressure Response to Treatment: Procedure was tolerated well Level of Consciousness (Post- Awake and Alert procedure): Post Debridement Measurements of Total  Wound Length: (cm) 0.8 Width: (cm) 0.7 Depth: (cm) 0.2 Volume: (cm) 0.088 Character of Wound/Ulcer Post Debridement: Improved Post Procedure Diagnosis Same as Pre-procedure Electronic Signature(s) Signed: 10/03/2023 12:32:37 PM By: Joan Delon MD FACS Signed: 10/03/2023 3:11:11 PM By: Joan Sailors RN, BSN Entered By: [name] on 10/03/2023 07:19:03 HPI Details -------------------------------------------------------------------------------- [name] ([id number]) [id number] Page 3 of 9 Patient Name: Date of Service: [name], [name] Joan. 10/03/2023 9:30 [name] Medical Record Number: [id number] Patient Account Number: [id number] Date of  Birth/Sex: Treating RN: [birth year] (58 y.o. F) Primary Care Provider: PCP, [name]: Referring Provider: Treating Provider/Extender: [name] in Treatment: 13 History of Present Illness HPI Description: ADMISSION 07/02/2023 ***ABIs non-compressible bilaterally; triphasic Doppler signals*** This is Joan 58 year old morbidly obese patient who is not Joan diabetic. She does have hypertension and CHF. Although she does not carry this diagnosis in her electronic medical record, she clearly has lymphedema. She has multiple wounds on her bilateral lower extremities, 1 of which has been present for 3 years. She has been treating her wounds with peroxide and bacitracin. Her provider has given her various courses of antibiotics, but the wounds have not closed. She was told not to wear compression stockings due to her history of DVT but this was well over Joan year ago. She has never worn lymphedema pumps. , 07/09/2023: Several of the smaller wounds have closed. Remaining wounds include the left proximal medial ulcer and the left lateral distal ulcer, as well as the paired wounds on her right lateral lower leg. There is slough accumulation at all sites. 07/16/2023: The left distal lateral leg ulcer is Joan little bit  smaller and quite Joan bit cleaner. The more proximal medial left lower leg ulcer still has Joan fair amount of nonviable tissue present and this more tender. Her wrap did slide down and it appears that it came to rest right in the middle of this wound causing some pressure on the site. The paired right lateral lower leg ulcers are small and superficial with Joan little bit of slough and eschar present. 07/23/2023: The right lateral lower leg ulcers are nearly closed but remain just barely open underneath Joan layer of eschar. The proximal medial left lower leg ulcer has less nonviable tissue present today and is smaller. The left distal lateral leg ulcer is unchanged in size, but the surface is clean and the quality of the tissue is improving. 07/30/2023: Both right leg ulcers are healed. The proximal medial left leg ulcer is smaller and more superficial. The left distal lateral leg ulcer is still unchanged in size. There is more granulation tissue filling in, but the surface is still fairly fibrotic. Edema control is adequate. 08/07/2023: She unfortunately had to travel out of town urgently to go to [place] this past week and as Joan result, was on her feet and with her legs in Joan dependent position quite Joan bit. This has resulted in the medial leg ulcer getting Joan little bit larger and the lateral leg ulcer getting Joan little bit deeper. Her juxta lite stockings were delivered and she has them with her today. She has been approved for Epifix so we will proceed with application today. 08/13/2023: The medial leg ulcer is quite Joan bit smaller this week and the lateral leg ulcer also measured slightly smaller but the tissue surface is healthier- looking. She has been on her feet again Joan lot this past week so her edema control remains poor. 08/20/2023: The medial leg ulcer is nearly healed with just Joan tiny open area remaining underneath Joan layer of eschar. The lateral leg ulcer is unchanged and the surface remains Joan bit  fibrotic. Edema control is better this week. 12/3; the medial ulcer is healed. On the left lateral we still have Joan punched-out area with roughly 0.4 cm in depth. The granulation tissue at the base of this looks healthy. EpiFix reapplied in the standard fashion. 12/12; the patient's medial left leg ulcer was healed however her Unna boot fell  down this week and she has Joan new wound on the upper medial lower leg in the setting of uncontrolled swelling. The wound that we she had last week where we are putting epi fix is about the same but the swelling is just too much. She did not come in with any stocking on the right leg 09/14/2023: The medial leg ulcer is Joan little bit smaller today. The lateral leg ulcer continues to contract. There is still some depth to it and Joan thick layer of slough has accumulated. 09/27/2023: The medial leg ulcer is healed. The lateral leg ulcer is smaller today. There is still some depth to it but not much slough accumulation. Edema control is significantly improved. She is wearing her compression stocking on her right leg. 10/03/2023: The lateral leg ulcer continues to contract. There is Joan small amount of undermining at the more proximal aspect. Light slough on the surface. Edema control remains very good. She says that her lymphedema pumps are supposed to be delivered within the week. Electronic Signature(s) Signed: 10/03/2023 10:24:45 AM By: Joan Nest MD FACS Entered By: Joan Nest on 10/03/2023 07:24:45 -------------------------------------------------------------------------------- Physical Exam Details Patient Name: Date of Service: [name], [name]. 10/03/2023 9:30 [name] Medical Record Number: [id number] Patient Account Number: [id number] Date of Birth/Sex: Treating RN: Aug 06, 1966 (58 y.o. F) Primary Care Provider: PCP, [name]: Referring Provider: Treating Provider/Extender: Joan Nest [name] in Treatment: 13 Constitutional Hypertensive,  asymptomatic. Tachycardic, asymptomatic. . . no acute distress. AASIA, PEAVLER Joan ([id number]) [id number] Page 4 of 9 Respiratory Normal work of breathing on room air.. Notes 10/03/2023: The lateral leg ulcer continues to contract. There is Joan small amount of undermining at the more proximal aspect. Light slough on the surface. Edema control remains very good. Electronic Signature(s) Signed: 10/03/2023 10:25:25 AM By: Joan Nest MD FACS Entered By: Joan Nest on 10/03/2023 07:25:25 -------------------------------------------------------------------------------- Physician Orders Details Patient Name: Date of Service: [name], [name]. 10/03/2023 9:30 [name] Medical Record Number: [id number] Patient Account Number: [id number] Date of Birth/Sex: Treating RN: Nov 28, 1965 (58 y.o. [name] Primary Care Provider: PCP, [name]: Referring Provider: Treating Provider/Extender: Joan Nest [name] in Treatment: 13 Verbal / Phone Orders: No Diagnosis Coding ICD-10 Coding Code Description [phone] 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osed I87.319 Chronic venous hypertension (idiopathic) with ulcer of unspecified lower extremity I89.0 Lymphedema, not elsewhere classified I50.32 Chronic diastolic (congestive) heart failure E66.01 Morbid (severe) obesity due to excess calories Follow-up Appointments ppointment in 1 week. - Dr. Marolyn Return Joan 1/13 @ 09:30 am Anesthetic (In clinic) Topical Lidocaine 4% applied to wound bed Cellular or Tissue Based Products Wound #6 Left,Lateral Lower Leg Cellular or Tissue Based Product Type: - Epifix #8 applied 10/03/23 Epifix #7 Cellular or Tissue Based Product applied to wound bed, secured with steri-strips, cover with Adaptic or Mepitel. (DO NOT REMOVE). Bathing/ Shower/ Hygiene May shower with protection but do not get wound dressing(s) wet. Protect dressing(s) with  water repellant cover (for example, large plastic bag) or Joan cast cover and may then take shower. - Keep legs dry until next appointment. Use Cast Protectors if so desired. Other Bathing/Shower/Hygiene Orders/Instructions: - May want to sponge bath if legs cannot be kept dry until next appointment. Additional Orders / Instructions Follow Nutritious Diet - Try and increase Protein intake. The goal is 70g-100g per day. Examples are chicken, fish, meat, pork, Greek yogurt, eggs etc. Lymphedema Treatment  Plan - Exercise, Compression and Elevation Exercise daily as tolerated. (Walking, ROM, Calf Pumps and Toe Taps) Compression Wraps as ordered Elevate legs 30 - 60 minutes at or above heart level at least 3 - 4 times daily as able/tolerated Avoid standing for long periods and elevate leg(s) parallel to the floor when sitting Use Pneumatic Compression Device on leg(s) 2-3 times Joan day for 45-60 minutes - grade 2 lymphedema with skin changes and chronic edema Wound Treatment Wound #6 - Lower Leg Wound Laterality: Left, Lateral Peri-Wound Care: Sween Lotion (Moisturizing lotion) 1 x Per Week Discharge Instructions: Apply moisturizing lotion as directed Prim Dressing: ADAPTIC TOUCH 3x4.25 (in/in) 1 x Per Week ary Discharge Instructions: Apply to wound bed as instructed CLARETHA, TOWNSHEND Joan ([id number]) [phone].pdf Page 5 of 9 Prim Dressing: Hydrofera Blue Ready Transfer Foam, 2.5x2.5 (in/in) 1 x Per Week ary Discharge Instructions: Apply directly to wound bed as directed Prim Dressing: Epifix ary 1 x Per Week Secondary Dressing: Woven Gauze Sponge, Non-Sterile 4x4 in 1 x Per Week Discharge Instructions: Apply over primary dressing as directed. Compression Wrap: Unnaboot w/Calamine, 4x10 (in/yd) 1 x Per Week Discharge Instructions: Apply Unnaboot as directed. Electronic Signature(s) Signed: 10/03/2023 12:32:37 PM By: Joan Nest MD FACS Signed: 10/03/2023 3:11:11 PM By:  Joan Sailors RN, BSN Entered By: [name] on 10/03/2023 07:33:42 -------------------------------------------------------------------------------- Problem List Details Patient Name: Date of Service: [name], [name]. 10/03/2023 9:30 [name] Medical Record Number: [id number] Patient Account Number: [id number] Date of Birth/Sex: Treating RN: 1966/05/19 (58 y.o. F) Primary Care Provider: PCP, [name]: Referring Provider: Treating Provider/Extender: Joan Nest [name] in Treatment: 13 Active Problems ICD-10 Encounter Code Description Active Date MDM Diagnosis L97.822 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osed10/03/2023 No Yes I87.319 Chronic venous hypertension (idiopathic) with ulcer of unspecified lower 07/02/2023 No Yes extremity I89.0 Lymphedema, not elsewhere classified 07/02/2023 No Yes I50.32 Chronic diastolic (congestive) heart failure 07/02/2023 No Yes E66.01 Morbid (severe) obesity due to excess calories 07/02/2023 No Yes Inactive Problems Resolved Problems ICD-10 Code Description Active Date Resolved Date L97.812 Non-pressure chronic ulcer of other part of right lower leg with fat layer exposed 07/02/2023 07/02/2023 Electronic Signature(s) Joan SHUCK Joan ([id number]) [id number] Page 6 of 9 Signed: 10/03/2023 10:23:38 AM By: Joan Nest MD FACS Entered By: Joan Nest on 10/03/2023 07:23:38 -------------------------------------------------------------------------------- Progress Note Details Patient Name: Date of Service: [name], [name]. 10/03/2023 9:30 [name] Medical Record Number: [id number] Patient Account Number: [id number] Date of Birth/Sex: Treating RN: 01-15-1966 (57 y.o. F) Primary Care Provider: PCP, [name]: Referring Provider: Treating Provider/Extender: Joan Nest [name] in Treatment: 13 Subjective Chief Complaint Information obtained from Patient Patient  presents for treatment of open ulcers due to lymphedema History of Present Illness (HPI) ADMISSION 07/02/2023 ***ABIs non-compressible bilaterally; triphasic Doppler signals*** This is Joan 58 year old morbidly obese patient who is not Joan diabetic. She does have hypertension and CHF. Although she does not carry this diagnosis in her electronic medical record, she clearly has lymphedema. She has multiple wounds on her bilateral lower extremities, 1 of which has been present for 3 years. She has been treating her wounds with peroxide and bacitracin. Her provider has given her various courses of antibiotics, but the wounds have not closed. She was told not to wear compression stockings due to her history of DVT but this was well over Joan year ago. She has never worn lymphedema pumps. , 07/09/2023: Several of the  smaller wounds have closed. Remaining wounds include the left proximal medial ulcer and the left lateral distal ulcer, as well as the paired wounds on her right lateral lower leg. There is slough accumulation at all sites. 07/16/2023: The left distal lateral leg ulcer is Joan little bit smaller and quite Joan bit cleaner. The more proximal medial left lower leg ulcer still has Joan fair amount of nonviable tissue present and this more tender. Her wrap did slide down and it appears that it came to rest right in the middle of this wound causing some pressure on the site. The paired right lateral lower leg ulcers are small and superficial with Joan little bit of slough and eschar present. 07/23/2023: The right lateral lower leg ulcers are nearly closed but remain just barely open underneath Joan layer of eschar. The proximal medial left lower leg ulcer has less nonviable tissue present today and is smaller. The left distal lateral leg ulcer is unchanged in size, but the surface is clean and the quality of the tissue is improving. 07/30/2023: Both right leg ulcers are healed. The proximal medial left leg ulcer is  smaller and more superficial. The left distal lateral leg ulcer is still unchanged in size. There is more granulation tissue filling in, but the surface is still fairly fibrotic. Edema control is adequate. 08/07/2023: She unfortunately had to travel out of town urgently to go to [place] this past week and as Joan result, was on her feet and with her legs in Joan dependent position quite Joan bit. This has resulted in the medial leg ulcer getting Joan little bit larger and the lateral leg ulcer getting Joan little bit deeper. Her juxta lite stockings were delivered and she has them with her today. She has been approved for Epifix so we will proceed with application today. 08/13/2023: The medial leg ulcer is quite Joan bit smaller this week and the lateral leg ulcer also measured slightly smaller but the tissue surface is healthier- looking. She has been on her feet again Joan lot this past week so her edema control remains poor. 08/20/2023: The medial leg ulcer is nearly healed with just Joan tiny open area remaining underneath Joan layer of eschar. The lateral leg ulcer is unchanged and the surface remains Joan bit fibrotic. Edema control is better this week. 12/3; the medial ulcer is healed. On the left lateral we still have Joan punched-out area with roughly 0.4 cm in depth. The granulation tissue at the base of this looks healthy. EpiFix reapplied in the standard fashion. 12/12; the patient's medial left leg ulcer was healed however her Unna boot fell down this week and she has Joan new wound on the upper medial lower leg in the setting of uncontrolled swelling. The wound that we she had last week where we are putting epi fix is about the same but the swelling is just too much. She did not come in with any stocking on the right leg 09/14/2023: The medial leg ulcer is Joan little bit smaller today. The lateral leg ulcer continues to contract. There is still some depth to it and Joan thick layer of slough has accumulated. 09/27/2023: The  medial leg ulcer is healed. The lateral leg ulcer is smaller today. There is still some depth to it but not much slough accumulation. Edema control is significantly improved. She is wearing her compression stocking on her right leg. 10/03/2023: The lateral leg ulcer continues to contract. There is Joan small amount of undermining at  the more proximal aspect. Light slough on the surface. Edema control remains very good. She says that her lymphedema pumps are supposed to be delivered within the week. Objective Constitutional [name], [name] Joan ([id number]) [id number] Page 7 of 9 Hypertensive, asymptomatic. Tachycardic, asymptomatic. no acute distress. Vitals Time Taken: 9:51 AM, Height: 71 in, Weight: 352 lbs, BMI: 49.1, Temperature: 98.2 F, Pulse: 115 bpm, Respiratory Rate: 20 breaths/min, Blood Pressure: 161/85 mmHg. Respiratory Normal work of breathing on room air.. General Notes: 10/03/2023: The lateral leg ulcer continues to contract. There is Joan small amount of undermining at the more proximal aspect. Light slough on the surface. Edema control remains very good. Integumentary (Hair, Skin) Wound #6 status is Open. Original cause of wound was Gradually Appeared. The date acquired was: 08/09/2020. The wound has been in treatment 13 weeks. The wound is located on the Left,Lateral Lower Leg. The wound measures 0.8cm length x 0.7cm width x 0.2cm depth; 0.44cm^2 area and 0.088cm^3 volume. There is Fat Layer (Subcutaneous Tissue) exposed. There is Joan medium amount of serous drainage noted. The wound margin is distinct with the outline attached to the wound base. There is large (67-100%) red, pink granulation within the wound bed. There is Joan small (1-33%) amount of necrotic tissue within the wound bed. The periwound skin appearance had no abnormalities noted for texture. The periwound skin appearance had no abnormalities noted for moisture. The periwound skin appearance exhibited:  Hemosiderin Staining. Periwound temperature was noted as No Abnormality. The periwound has tenderness on palpation. Assessment Active Problems ICD-10 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osed Chronic venous hypertension (idiopathic) with ulcer of unspecified lower extremity Lymphedema, not elsewhere classified Chronic diastolic (congestive) heart failure Morbid (severe) obesity due to excess calories Procedures Wound #6 Pre-procedure diagnosis of Wound #6 is Joan Lymphedema located on the Left,Lateral Lower Leg . There was Joan Excisional Skin/Subcutaneous Tissue Debridement with Joan total area of 0.44 sq cm performed by Joan Nest, MD. With the following instrument(s): Curette to remove Non-Viable tissue/material. Material removed includes Subcutaneous Tissue and Slough and after achieving pain control using Lidocaine 4% T opical Solution. No specimens were taken. Joan time out was conducted at 10:15, prior to the start of the procedure. Joan Minimum amount of bleeding was controlled with Pressure. The procedure was tolerated well. Post Debridement Measurements: 0.8cm length x 0.7cm width x 0.2cm depth; 0.088cm^3 volume. Character of Wound/Ulcer Post Debridement is improved. Post procedure Diagnosis Wound #6: Same as Pre-Procedure Pre-procedure diagnosis of Wound #6 is Joan Lymphedema located on the Left,Lateral Lower Leg. Joan skin graft procedure using Joan bioengineered skin substitute/cellular or tissue based product was performed by Joan Nest, MD with the following instrument(s): Forceps. Epifix was applied and secured with Steri-Strips. 1.8 sq cm of product was utilized and 0 sq cm was wasted. Post Application, hydroferablue, adaptic, gauze was applied. Joan Time Out was conducted at 10:15, prior to the start of the procedure. The procedure was tolerated well with Joan pain level of 0 throughout and Joan pain level of 0 following the procedure. Post procedure  Diagnosis Wound #6: Same as Pre-Procedure . Pre-procedure diagnosis of Wound #6 is Joan Lymphedema located on the Left,Lateral Lower Leg . There was Joan Radio Broad[employer] Assistant Compression Therapy Procedure by Joan Sailors, RN. Post procedure Diagnosis Wound #6: Same as Pre-Procedure Plan Follow-up Appointments: Return Appointment in 1 week. - Dr. Marolyn 1/13 @ 09:30 am Anesthetic: (In clinic) Topical Lidocaine 4% applied to wound bed Cellular  or Tissue Based Products: Wound #6 Left,Lateral Lower Leg: Cellular or Tissue Based Product Type: - Epifix #8 applied 10/03/23 Epifix #7 Cellular or Tissue Based Product applied to wound bed, secured with steri-strips, cover with Adaptic or Mepitel. (DO NOT REMOVE). Bathing/ Shower/ Hygiene: May shower with protection but do not get wound dressing(s) wet. Protect dressing(s) with water repellant cover (for example, large plastic bag) or Joan cast cover and may then take shower. - Keep legs dry until next appointment. Use Cast Protectors if so desired. Other Bathing/Shower/Hygiene Orders/Instructions: - May want to sponge bath if legs cannot be kept dry until next appointment. Additional Orders / Instructions: Follow Nutritious Diet - Try and increase Protein intake. The goal is 70g-100g per day. Examples are chicken, fish, meat, pork, Greek yogurt, eggs etc. Lymphedema Treatment Plan - Exercise, Compression and Elevation: Exercise daily as tolerated. (Walking, ROM, Calf Pumps and T T oe aps) Compression Wraps as ordered [name], [name] Joan ([id number]) [id number] Page 8 of 9 Elevate legs 30 - 60 minutes at or above heart level at least 3 - 4 times daily as able/tolerated Avoid standing for long periods and elevate leg(s) parallel to the floor when sitting Use Pneumatic Compression Device on leg(s) 2-3 times Joan day for 45-60 minutes - grade 2 lymphedema with skin changes and chronic edema WOUND #6: - Lower Leg Wound Laterality: Left,  Lateral Peri-Wound Care: Sween Lotion (Moisturizing lotion) 1 x Per Week/ Discharge Instructions: Apply moisturizing lotion as directed Prim Dressing: ADAPTIC TOUCH 3x4.25 (in/in) 1 x Per Week/ ary Discharge Instructions: Apply to wound bed as instructed Prim Dressing: Hydrofera Blue Ready Transfer Foam, 2.5x2.5 (in/in) 1 x Per Week/ ary Discharge Instructions: Apply directly to wound bed as directed Prim Dressing: Epifix 1 x Per Week/ ary Secondary Dressing: Woven Gauze Sponge, Non-Sterile 4x4 in 1 x Per Week/ Discharge Instructions: Apply over primary dressing as directed. Com pression Wrap: Unnaboot w/Calamine, 4x10 (in/yd) 1 x Per Week/ Discharge Instructions: Apply Unnaboot as directed. 10/03/2023: The lateral leg ulcer continues to contract. There is Joan small amount of undermining at the more proximal aspect. Light slough on the surface. Edema control remains very good. She says that her lymphedema pumps are supposed to be delivered within the week. I used Joan curette to debride slough and subcutaneous tissue from the wound. EpiFix #8 was then prepared and applied in standard fashion, moistening it with saline and using Hydrofera Blue sponge as Joan bolster to hold it in place. The skin substitute was secured in place with Adaptic and Steri-Strips. Continue Unna boot compression. As soon as she receives her lymphedema pumps, she may start using them. Would recommend an hour per day. Follow-up in 1 week. Electronic Signature(s) Signed: 10/03/2023 5:28:22 PM By: Drury Nestle RN, BSN Signed: 10/04/2023 7:57:50 AM By: Joan Nest MD FACS Previous Signature: 10/03/2023 10:29:24 AM Version By: Joan Nest MD FACS Entered By: Drury Nestle on 10/03/2023 14:24:54 -------------------------------------------------------------------------------- SuperBill Details Patient Name: Date of Service: [name], [name]. 10/03/2023 Medical Record Number: [id number] Patient Account Number: [id number] Date  of Birth/Sex: Treating RN: Nov 11, 1965 (58 y.o. F) Primary Care Provider: PCP, [name]: Referring Provider: Treating Provider/Extender: Joan Nest [name] in Treatment: 13 Diagnosis Coding ICD-10 Codes Code Description [phone] 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osed I87.319 Chronic venous hypertension (idiopathic) with ulcer of unspecified lower extremity I89.0 Lymphedema, not elsewhere classified I50.32 Chronic diastolic (congestive) heart failure E66.01 Morbid (severe) obesity due to  excess calories Facility Procedures : CPT4 Code: [id number] Description: Q4186 Epifix 2cm x 3cm Modifier: Quantity: 1.8 : CPT4 Code: [id number] Description: 15271 - SKIN SUB GRAFT TRNK/ARM/LEG ICD-10 Diagnosis Description L97.822 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o I87.319 Chronic venous hypertension (idiopathic) with ulcer of unspecified lower extre  I89.0 Lymphedema, not elsewhere classified I50.32 Chronic diastolic (congestive) heart failure Modifier: sed mity Quantity: 1 Physician Procedures : CPT4 Code Description Modifier [id number] 15271 - WC PHYS SKIN SUB GRAFT TRNK/ARM/LEG [name], [name] ([id number]) [id number] ICD-10 Diagnosis Description [phone] Non-pressure chronic ulcer of other part of left lower leg with fat  layer exposed I87.319 Chronic venous hypertension (idiopathic) with ulcer of unspecified lower extremity I89.0 Lymphedema, not elsewhere classified I50.32 Chronic diastolic (congestive) heart failure Quantity: 1 df Page 9 of 9 Electronic Signature(s) Signed: 10/03/2023 10:29:43 AM By: Joan Nest MD FACS Entered By: Joan Nest on 10/03/2023 07:29:43

## 2023-10-03 NOTE — Progress Notes (Signed)
 HAJER, DWYER [name] ([id number]) [id number] Page 1 of 7 Visit Report for 10/03/2023 Arrival Information Details Patient Name: Date of Service: [name], [name]. 10/03/2023 9:30 [name] Medical Record Number: [id number] Patient Account Number: [id number] Date of Birth/Sex: Treating RN: [birth year] (58 y.o. F) Primary Care [name]: PCP, [name]: Referring [name]: Treating [name]: [name] in [name]: 13 Visit Information History Since Last Visit Added or deleted any medications: No Patient Arrived: Cane Any new allergies or adverse reactions: No Arrival Time: 09:51 Had [name] fall or experienced change in No Accompanied By: self activities of daily living that may affect Transfer Assistance: None risk of falls: Patient Identification Verified: Yes Signs or symptoms of abuse/neglect since last visito No Secondary Verification Process Completed: Yes Hospitalized since last visit: No Patient Requires Transmission-Based Precautions: No Implantable device outside of the clinic excluding No Patient Has Alerts: Yes cellular tissue based products placed in the center Patient Alerts: [name]  since last visit: ABI R [place] (07/02/23) Pain Present Now: No ABI L [place] (07/02/23) Electronic Signature(s) Signed: 10/03/2023 11:38:54 AM By: [name] Entered By: [name] on 10/03/2023 06:51:30 -------------------------------------------------------------------------------- Compression Therapy Details Patient Name: Date of Service: Joan [name]. 10/03/2023 9:30 [name] Medical Record Number: [id number] Patient Account Number: [id number] Date of Birth/Sex: Treating RN: 1966-03-21 (58 y.o. [name] Primary Care Dishawn Bhargava: PCP, [name]: Referring Cru Kritikos: Treating [name]/Extender: [name] in [name]: 13 Compression Therapy Performed for Wound Assessment: Wound #6 Left,Lateral Lower Leg Performed By: Clinician Joan Sailors, RN Compression Type: Nonie Seaman Post Procedure Diagnosis Same as Pre-procedure Electronic Signature(s) Signed: 10/03/2023 3:11:11 PM By: Joan Sailors RN, BSN Entered By: [name] on 10/03/2023 07:55:27 Encounter Discharge Information Details -------------------------------------------------------------------------------- [name] ([id number]) [id number] Page 2 of 7 Patient Name: Date of Service: [name], [name]. 10/03/2023 9:30 [name] Medical Record Number: [id number] Patient Account Number: [id number] Date of Birth/Sex: Treating RN: June 17, 1966 (58 y.o. [name] Primary Care Terin Cragle: PCP, [name]: Referring Latravia Southgate: Treating [name]: [name] in [name]: 13 Encounter Discharge Information Items Post Procedure Vitals Discharge Condition: Stable Temperature (F): 98.2 Ambulatory Status: Cane Pulse (bpm): 115 Discharge Destination: Home Respiratory Rate (breaths/min): 18 Transportation: Private Auto Blood Pressure (mmHg): 161/85 Accompanied By: self Schedule Follow-up Appointment: Yes Clinical Summary of Care: Patient Declined Electronic Signature(s) Signed: 10/03/2023 3:11:11 PM By: Joan Sailors RN, BSN Entered By: [name] on 10/03/2023 07:57:13 -------------------------------------------------------------------------------- Lower Extremity Assessment Details Patient Name: Date of Service: Joan [name]. 10/03/2023 9:30 [name] Medical Record Number: [id number] Patient Account Number: [id number] Date of Birth/Sex: Treating RN: Dec 25, 1965 (58 y.o. [name] Primary Care [name]: PCP, [name]: Referring [name]: Treating [name]: [name] in [name]: 13 Edema Assessment Assessed: [Left: No] [Right: No] Edema: [Left: Ye] [Right: s] Calf Left: Right: Point of Measurement: 32 cm From Medial Instep 43 cm Ankle Left: Right: Point of  Measurement: 10 cm From Medial Instep 28.5 cm Vascular Assessment Pulses: Dorsalis Pedis Palpable: [Left:Yes] Extremity colors, hair growth, and conditions: Extremity Color: [Left:Hyperpigmented] Hair Growth on Extremity: [Left:No] Temperature of Extremity: [Left:Warm] Capillary Refill: [Left:< 3 seconds] Dependent Rubor: [Left:No No] Electronic Signature(s) Signed: 10/03/2023 3:11:11 PM By: Joan Sailors RN, BSN Entered By: [name] on 10/03/2023 07:05:54 [name] ([id number]) [id number] Page 3 of 7 -------------------------------------------------------------------------------- Multi Wound Chart Details Patient Name: Date of Service: [name], [name] 10/03/2023 9:30  [name] Medical Record Number: [id number] Patient Account Number: [id number] Date of Birth/Sex: Treating RN: 19-Aug-1966 (58 y.o. F) Primary Care [name]: PCP, [name]: Referring [name]: Treating [name]: [name] in [name]: 13 Vital Signs Height(in): 71 Pulse(bpm): 115 Weight(lbs): 352 Blood Pressure(mmHg): 161/85 Body Mass Index(BMI): 49.1 Temperature(F): 98.2 Respiratory Rate(breaths/min): 20 [6:Photos:] [N/[name]] Left, Lateral Lower Leg N/[name] N/[name] Wound Location: Gradually Appeared N/[name] N/[name] Wounding Event: Lymphedema N/[name] N/[name] Primary Etiology: Anemia, Asthma, Congestive Heart N/[name] N/[name] Comorbid History: Failure, Deep Vein Thrombosis, Hypertension, Osteoarthritis 08/09/2020 N/[name] N/[name] Date Acquired: 13 N/[name] N/[name] Weeks of [name]: Open N/[name] N/[name] Wound Status: No N/[name] N/[name] Wound Recurrence: 0.8x0.7x0.2 N/[name] N/[name] Measurements L x W x D (cm) 0.44 N/[name] N/[name] [name] (cm) : rea 0.088 N/[name] N/[name] Volume (cm) : 87.80% N/[name] N/[name] % Reduction in [name] rea: 91.90% N/[name] N/[name] % Reduction in Volume: Full Thickness Without Exposed N/[name] N/[name] Classification: Support Structures Medium N/[name] N/[name] Exudate [name] mount: Serous N/[name] N/[name] Exudate Type: amber N/[name] N/[name] Exudate  Color: Distinct, outline attached N/[name] N/[name] Wound Margin: Large (67-100%) N/[name] N/[name] Granulation [name] mount: Red, Pink N/[name] N/[name] Granulation Quality: Small (1-33%) N/[name] N/[name] Necrotic [name] mount: Fat Layer (Subcutaneous Tissue): Yes N/[name] N/[name] Exposed Structures: Fascia: No Tendon: No Muscle: No Joint: No Bone: No Small (1-33%) N/[name] N/[name] Epithelialization: Debridement - Excisional N/[name] N/[name] Debridement: Pre-procedure Verification/Time Out 10:15 N/[name] N/[name] Taken: Lidocaine 4% Topical Solution N/[name] N/[name] Pain Control: Subcutaneous, Slough N/[name] N/[name] Tissue Debrided: Skin/Subcutaneous Tissue N/[name] N/[name] Level: 0.44 N/[name] N/[name] Debridement [name] (sq cm): rea Curette N/[name] N/[name] Instrument: Minimum N/[name] N/[name] Bleeding: Pressure N/[name] N/[name] Hemostasis [name] chieved: Procedure was tolerated well N/[name] N/[name] Debridement [name] Response: 0.8x0.7x0.2 N/[name] N/[name] Post Debridement Measurements L x W x D (cm) 0.088 N/[name] N/[name] Post Debridement Volume: (cm) Scarring: Yes N/[name] N/[name] Periwound Skin Texture: No Abnormalities Noted N/[name] N/[name] Periwound Skin MoistureDAZHA, [name] [name] ([id number]) [id number] Page 4 of 7 Hemosiderin Staining: Yes N/[name] N/[name] Periwound Skin Color: No Abnormality N/[name] N/[name] Temperature: Yes N/[name] N/[name] Tenderness on Palpation: Cellular or Tissue Based Product N/[name] N/[name] Procedures Performed: Debridement [name] Notes Electronic Signature(s) Signed: 10/03/2023 10:23:47 AM By: Joan Delon MD FACS Entered By: Joan Delon on 10/03/2023 07:23:47 -------------------------------------------------------------------------------- Multi-Disciplinary Care Plan Details Patient Name: Date of Service: Joan [name]. 10/03/2023 9:30 [name] Medical Record Number: [id number] Patient Account Number: [id number] Date of Birth/Sex: Treating RN: 27-Aug-1966 (58 y.o. [name] Primary Care Richel Millspaugh: PCP, [name]: Referring Tannisha Kennington: Treating [place] Carmack/Extender: [name] in  [name]: 13 Multidisciplinary Care Plan reviewed with physician Active Inactive Venous Leg Ulcer Nursing Diagnoses: Actual venous Insuffiency (use after diagnosis is confirmed) Knowledge deficit related to disease process and management Goals: Patient will maintain optimal edema control Date Initiated: 08/29/2023 Target Resolution Date: 10/25/2023 Goal Status: Active Interventions: Assess peripheral edema status every visit. Compression as ordered Provide education on venous insufficiency [name] Activities: Therapeutic compression applied : 08/29/2023 Notes: Wound/Skin Impairment Nursing Diagnoses: Impaired tissue integrity Goals: Patient/caregiver will verbalize understanding of skin care regimen Date Initiated: 07/02/2023 Target Resolution Date: 10/25/2023 Goal Status: Active Interventions: Assess ulceration(s) every visit [name] Activities: Skin care regimen initiated : 07/02/2023 Notes: Electronic Signature(s) Signed: 10/03/2023 3:11:11 PM By: Joan Sailors RN, BSN Joan SHUCK [name] ([id number]) PM By: Joan Sailors RN, BSN [phone].pdf Page 5 of 7 Signed: 10/03/2023 3:11:11 Entered By: [name] on 10/03/2023 07:09:23 -------------------------------------------------------------------------------- Pain Assessment Details Patient Name: Date of Service: [name], [name]. 10/03/2023  9:30 [name] Medical Record Number: [id number] Patient Account Number: [id number] Date of Birth/Sex: Treating RN: Aug 04, 1966 (58 y.o. F) Primary Care Emmanuel Gruenhagen: PCP, [name]: Referring Keaston Pile: Treating Meziah Blasingame/Extender: [name] in [name]: 13 Active Problems Location of Pain Severity and Description of Pain Patient Has Paino No Site Locations Pain Management and Medication Current Pain Management: Electronic Signature(s) Signed: 10/03/2023 11:38:54 AM By: [name] Entered By: [name] on 10/03/2023  06:52:04 -------------------------------------------------------------------------------- Patient/Caregiver Education Details Patient Name: Date of Service: Joan [name]. 1/8/2025andnbsp9:30 [name] Medical Record Number: [id number] Patient Account Number: [id number] Date of Birth/Gender: Treating RN: 07-04-1966 (58 y.o. [name] Primary Care Physician: PCP, [name]: Referring Physician: Treating Physician/Extender: [name] in [name]: 13 Education Assessment Education Provided To: Patient Education Topics Provided [name], [name] ([id number]) [id number] Page 6 of 7 Wound/Skin Impairment: Methods: Explain/Verbal Responses: State content correctly Electronic Signature(s) Signed: 10/03/2023 3:11:11 PM By: Joan Sailors RN, BSN Entered By: [name] on 10/03/2023 07:55:48 -------------------------------------------------------------------------------- Wound Assessment Details Patient Name: Date of Service: Joan [name]. 10/03/2023 9:30 [name] Medical Record Number: [id number] Patient Account Number: [id number] Date of Birth/Sex: Treating RN: 1966/07/03 (58 y.o. [name] Primary Care Nesiah Jump: PCP, [name]: Referring Kilian Schwartz: Treating Deaken Jurgens/Extender: [name] in [name]: 13 Wound Status Wound Number: 6 Primary Lymphedema Etiology: Wound Location: Left, Lateral Lower Leg Wound Open Wounding Event: Gradually Appeared Status: Date Acquired: 08/09/2020 Comorbid Anemia, Asthma, Congestive Heart Failure, Deep Vein Weeks Of [name]: 13 History: Thrombosis, Hypertension, Osteoarthritis Clustered Wound: No Photos Wound Measurements Length: (cm) 0.8 Width: (cm) 0.7 Depth: (cm) 0.2 Area: (cm) 0.44 Volume: (cm) 0.088 % Reduction in Area: 87.8% % Reduction in Volume: 91.9% Epithelialization: Small (1-33%) Wound Description Classification: Full Thickness Without Exposed  Support Structures Wound Margin: Distinct, outline attached Exudate Amount: Medium Exudate Type: Serous Exudate Color: amber Foul Odor After Cleansing: No Slough/Fibrino Yes Wound Bed Granulation Amount: Large (67-100%) Exposed Structure Granulation Quality: Red, Pink Fascia Exposed: No Necrotic Amount: Small (1-33%) Fat Layer (Subcutaneous Tissue) Exposed: Yes Tendon Exposed: No Muscle Exposed: No Joint Exposed: No Bone Exposed: No Periwound Skin Texture Texture Color [name], [name] ([id number]) [id number] Page 7 of 7 No Abnormalities Noted: Yes No Abnormalities Noted: No Hemosiderin Staining: Yes Moisture No Abnormalities Noted: Yes Temperature / Pain Temperature: No Abnormality Tenderness on Palpation: Yes [name] Notes Wound #6 (Lower Leg) Wound Laterality: Left, Lateral Cleanser Peri-Wound Care Sween Lotion (Moisturizing lotion) Discharge Instruction: Apply moisturizing lotion as directed Topical Primary Dressing ADAPTIC TOUCH 3x4.25 (in/in) Discharge Instruction: Apply to wound bed as instructed Hydrofera Blue Ready Transfer Foam, 2.5x2.5 (in/in) Discharge Instruction: Apply directly to wound bed as directed Epifix Secondary Dressing Woven Gauze Sponge, Non-Sterile 4x4 in Discharge Instruction: Apply over primary dressing as directed. Secured With Compression Wrap Unnaboot w/Calamine, 4x10 (in/yd) Discharge Instruction: Apply Unnaboot as directed. Compression Stockings Add-Ons Electronic Signature(s) Signed: 10/03/2023 3:11:11 PM By: Joan Sailors RN, BSN Entered By: [name] on 10/03/2023 07:07:43 -------------------------------------------------------------------------------- Vitals Details Patient Name: Date of Service: Joan [name]. 10/03/2023 9:30 [name] Medical Record Number: [id number] Patient Account Number: [id number] Date of Birth/Sex: Treating RN: Apr 01, 1966 (58 y.o. F) Primary Care Alassane Kalafut: PCP, NO Other  Clinician: Referring Annalisse Minkoff: Treating Ricka Westra/Extender: [name] in [name]: 13 Vital Signs Time Taken: 09:51 Temperature (F): 98.2 Height (in): 71 Pulse (bpm): 115 Weight (lbs): 352 Respiratory Rate (breaths/min): 20 Body Mass Index (BMI): 49.1 Blood Pressure (  mmHg): 161/85 Reference Range: 80 - 120 mg / dl Electronic Signature(s) Signed: 10/03/2023 11:38:54 AM By: [name] Entered By: [name] on 10/03/2023 06:51:58

## 2023-10-08 ENCOUNTER — Encounter (HOSPITAL_BASED_OUTPATIENT_CLINIC_OR_DEPARTMENT_OTHER): Payer: Medicare HMO | Admitting: General Surgery

## 2023-10-08 DIAGNOSIS — I87312 Chronic venous hypertension (idiopathic) with ulcer of left lower extremity: Secondary | ICD-10-CM | POA: Diagnosis not present

## 2023-10-10 NOTE — Progress Notes (Signed)
 [name], [name] ([id number]) [id number] Page 1 of 8 Visit Report for 10/08/2023 Chief Complaint Document Details Patient Name: Date of Service: A [name], [name] 10/08/2023 9:30 A M Medical Record Number: [id number] Patient Account Number: [id number] Date of Birth/Sex: Treating RN: 10-26-1965 (57 y.o. F) Primary Care Provider: PCP, [name]: Referring Provider: Treating Provider/Extender: Marolyn Delon Duos in Treatment: 14 Information Obtained from: Patient Chief Complaint Patient presents for treatment of open ulcers due to lymphedema Electronic Signature(s) Signed: 10/08/2023 10:08:41 AM By: Marolyn Delon MD FACS Entered By: Marolyn Delon on 10/08/2023 10:08:41 -------------------------------------------------------------------------------- Debridement Details Patient Name: Date of Service: DELENA GUILLERMO [name], [name] 10/08/2023 9:30 A M Medical Record Number: [id number] Patient Account Number: [id number] Date of Birth/Sex: Treating RN: 03/17/1966 (58 y.o. [name] Merleen Handing Primary Care Provider: PCP, [name]: Referring Provider: Treating Provider/Extender: Marolyn Delon Duos in Treatment: 14 Debridement Performed for Assessment: Wound #6 Left,Lateral Lower Leg Performed By: Physician Marolyn Delon, MD The following information was scribed by: Merleen Handing The information was scribed for: Marolyn Delon Debridement Type: Debridement Level of Consciousness (Pre-procedure): Awake and Alert Pre-procedure Verification/Time Out Yes - 10:05 Taken: Start Time: 10:06 Pain Control: Lidocaine 4% T opical Solution Percent of Wound Bed Debrided: 100% T Area Debrided (cm): otal 0.35 Tissue and other material debrided: Viable, Non-Viable, Eschar, Slough, Subcutaneous, Slough Level: Skin/Subcutaneous Tissue Debridement Description: Excisional Instrument: Curette Bleeding: Minimum Hemostasis Achieved: Pressure Procedural Pain:  0 Post Procedural Pain: 0 Response to Treatment: Procedure was tolerated well Level of Consciousness (Post- Awake and Alert procedure): Post Debridement Measurements of Total Wound Length: (cm) 0.9 Width: (cm) 0.5 Depth: (cm) 0.3 Volume: (cm) 0.106 Character of Wound/Ulcer Post Debridement: Improved [name], [name] ([id number]) [id number].pdf Page 2 of 8 Post Procedure Diagnosis Same as Pre-procedure Electronic Signature(s) Signed: 10/08/2023 10:42:11 AM By: Marolyn Delon MD FACS Signed: 10/08/2023 5:28:04 PM By: Merleen Handing RN, BSN Entered By: Merleen Handing on 10/08/2023 10:07:04 -------------------------------------------------------------------------------- HPI Details Patient Name: Date of Service: DELENA GUILLERMO [name], [name] 10/08/2023 9:30 A M Medical Record Number: [id number] Patient Account Number: [id number] Date of Birth/Sex: Treating RN: 21-Oct-1965 (58 y.o. F) Primary Care Provider: PCP, [name]: Referring Provider: Treating Provider/Extender: Marolyn Delon Duos in Treatment: 14 History of Present Illness HPI Description: ADMISSION 07/02/2023 ***ABIs non-compressible bilaterally; triphasic Doppler signals*** This is a 58 year old morbidly obese patient who is not a diabetic. She does have hypertension and CHF. Although she does not carry this diagnosis in her electronic medical record, she clearly has lymphedema. She has multiple wounds on her bilateral lower extremities, 1 of which has been present for 3 years. She has been treating her wounds with peroxide and bacitracin. Her provider has given her various courses of antibiotics, but the wounds have not closed. She was told not to wear compression stockings due to her history of DVT but this was well over a year ago. She has never worn lymphedema pumps. , 07/09/2023: Several of the smaller wounds have closed. Remaining wounds include the left proximal medial ulcer and the left lateral  distal ulcer, as well as the paired wounds on her right lateral lower leg. There is slough accumulation at all sites. 07/16/2023: The left distal lateral leg ulcer is a little bit smaller and quite a bit cleaner. The more proximal medial left lower leg ulcer still has a fair amount of nonviable tissue present and this more tender. Her wrap did slide down and it appears that it came to  rest right in the middle of this wound causing some pressure on the site. The paired right lateral lower leg ulcers are small and superficial with a little bit of slough and eschar present. 07/23/2023: The right lateral lower leg ulcers are nearly closed but remain just barely open underneath a layer of eschar. The proximal medial left lower leg ulcer has less nonviable tissue present today and is smaller. The left distal lateral leg ulcer is unchanged in size, but the surface is clean and the quality of the tissue is improving. 07/30/2023: Both right leg ulcers are healed. The proximal medial left leg ulcer is smaller and more superficial. The left distal lateral leg ulcer is still unchanged in size. There is more granulation tissue filling in, but the surface is still fairly fibrotic. Edema control is adequate. 08/07/2023: She unfortunately had to travel out of town urgently to go to [place] this past week and as a result, was on her feet and with her legs in a dependent position quite a bit. This has resulted in the medial leg ulcer getting a little bit larger and the lateral leg ulcer getting a little bit deeper. Her juxta lite stockings were delivered and she has them with her today. She has been approved for Epifix so we will proceed with application today. 08/13/2023: The medial leg ulcer is quite a bit smaller this week and the lateral leg ulcer also measured slightly smaller but the tissue surface is healthier- looking. She has been on her feet again a lot this past week so her edema control remains  poor. 08/20/2023: The medial leg ulcer is nearly healed with just a tiny open area remaining underneath a layer of eschar. The lateral leg ulcer is unchanged and the surface remains a bit fibrotic. Edema control is better this week. 12/3; the medial ulcer is healed. On the left lateral we still have a punched-out area with roughly 0.4 cm in depth. The granulation tissue at the base of this looks healthy. EpiFix reapplied in the standard fashion. 12/12; the patient's medial left leg ulcer was healed however her Unna boot fell down this week and she has a new wound on the upper medial lower leg in the setting of uncontrolled swelling. The wound that we she had last week where we are putting epi fix is about the same but the swelling is just too much. She did not come in with any stocking on the right leg 09/14/2023: The medial leg ulcer is a little bit smaller today. The lateral leg ulcer continues to contract. There is still some depth to it and a thick layer of slough has accumulated. 09/27/2023: The medial leg ulcer is healed. The lateral leg ulcer is smaller today. There is still some depth to it but not much slough accumulation. Edema control is significantly improved. She is wearing her compression stocking on her right leg. 10/03/2023: The lateral leg ulcer continues to contract. There is a small amount of undermining at the more proximal aspect. Light slough on the surface. Edema control remains very good. She says that her lymphedema pumps are supposed to be delivered within the week. 10/08/2023: The ulcer measured the same today, but to my eye, it appears both shallower and a little bit smaller. Minimal slough on the surface. Edema control remains good. She has her lymphedema pumps and somebody from the company is coming out to show her how to use them this week. Electronic Signature(s) Signed: 10/08/2023 10:09:27 AM By: Marolyn Nest  MD DOYAL MARYLEN SHUCK A ([id number])  [id number] Page 3 of 8 Entered By: Marolyn Nest on 10/08/2023 10:09:27 -------------------------------------------------------------------------------- Physical Exam Details Patient Name: Date of Service: DELENA [name], [name] 10/08/2023 9:30 A M Medical Record Number: [id number] Patient Account Number: [id number] Date of Birth/Sex: Treating RN: [birth year] (58 y.o. F) Primary Care Provider: PCP, [name]: Referring Provider: Treating Provider/Extender: Marolyn Nest Duos in Treatment: 14 Constitutional Hypertensive, asymptomatic. Tachycardic, asymptomatic. . . no acute distress. Respiratory Normal work of breathing on room air.. Notes 10/08/2023: The ulcer measured the same today, but to my eye, it appears both shallower and a little bit smaller. Minimal slough on the surface. Edema control remains good. Electronic Signature(s) Signed: 10/08/2023 10:15:10 AM By: Marolyn Nest MD FACS Entered By: Marolyn Nest on 10/08/2023 10:15:10 -------------------------------------------------------------------------------- Physician Orders Details Patient Name: Date of Service: DELENA GUILLERMO [name], [name] 10/08/2023 9:30 A M Medical Record Number: [id number] Patient Account Number: [id number] Date of Birth/Sex: Treating RN: [birth year] (58 y.o. [name] Merleen Handing Primary Care Provider: PCP, [name]: Referring Provider: Treating Provider/Extender: Marolyn Nest Duos in Treatment: 14 The following information was scribed by: Merleen Handing The information was scribed for: Marolyn Nest Verbal / Phone Orders: No Diagnosis Coding ICD-10 Coding Code Description 5306491313 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osed I87.319 Chronic venous hypertension (idiopathic) with ulcer of unspecified lower extremity I89.0 Lymphedema, not elsewhere classified I50.32 Chronic diastolic (congestive) heart failure E66.01  Morbid (severe) obesity due to excess calories Follow-up Appointments ppointment in 1 week. - Dr. Marolyn Return A 1/20 @ 09:30 am Anesthetic (In clinic) Topical Lidocaine 4% applied to wound bed Cellular or Tissue Based Products Wound #6 Left,Lateral Lower Leg Cellular or Tissue Based Product Type: - Epifix #8 applied 10/03/23 Cellular or Tissue Based Product applied to wound bed, secured with steri-strips, cover with Adaptic or Mepitel. (DO NOT REMOVE). BETTYANN, BIRCHLER A ([id number]) [id number] Page 4 of 8 Bathing/ Shower/ Hygiene May shower with protection but do not get wound dressing(s) wet. Protect dressing(s) with water repellant cover (for example, large plastic bag) or a cast cover and may then take shower. - Keep legs dry until next appointment. Use Cast Protectors if so desired. Additional Orders / Instructions Follow Nutritious Diet - Try to increase protein intake. The goal is 70g-100g per day. Examples are chicken, fish, meat, pork, Greek yogurt, eggs etc. Lymphedema Treatment Plan - Exercise, Compression and Elevation Exercise daily as tolerated. (Walking, ROM, Calf Pumps and Toe Taps) Compression Garments, Class II. Wear daily when out of bed. May remove at bedtime - right leg Compression Wraps as ordered Elevate legs 30 - 60 minutes at or above heart level at least 3 - 4 times daily as able/tolerated Avoid standing for long periods and elevate leg(s) parallel to the floor when sitting Use Pneumatic Compression Device on leg(s) 2-3 times a day for 45-60 minutes - grade 2 lymphedema with skin changes and chronic edema Wound Treatment Wound #6 - Lower Leg Wound Laterality: Left, Lateral Peri-Wound Care: Sween Lotion (Moisturizing lotion) 1 x Per Week Discharge Instructions: Apply moisturizing lotion as directed Prim Dressing: Promogran Prisma Matrix, 4.34 (sq in) (silver collagen) 1 x Per Week ary Discharge Instructions: Moisten collagen with saline  or hydrogel Prim Dressing: Epifix ary 1 x Per Week Secondary Dressing: Drawtex 4x4 in 1 x Per Week Discharge Instructions: Apply over primary dressing as directed. Secondary Dressing: Woven Gauze Sponge, Non-Sterile 4x4 in 1 x  Per Week Discharge Instructions: Apply over primary dressing as directed. Compression Wrap: Unnaboot w/Calamine, 4x10 (in/yd) 1 x Per Week Discharge Instructions: Apply Unnaboot as directed. Electronic Signature(s) Signed: 10/08/2023 10:42:11 AM By: Marolyn Nest MD FACS Entered By: Marolyn Nest on 10/08/2023 10:17:24 -------------------------------------------------------------------------------- Problem List Details Patient Name: Date of Service: DELENA GUILLERMO [name], [name] 10/08/2023 9:30 A M Medical Record Number: [id number] Patient Account Number: [id number] Date of Birth/Sex: Treating RN: Aug 10, 1966 (58 y.o. [name] Merleen Handing Primary Care Provider: PCP, [name]: Referring Provider: Treating Provider/Extender: Marolyn Nest Duos in Treatment: 14 Active Problems ICD-10 Encounter Code Description Active Date MDM Diagnosis L97.822 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osed10/03/2023 No Yes I87.319 Chronic venous hypertension (idiopathic) with ulcer of unspecified lower 07/02/2023 No Yes extremity I89.0 Lymphedema, not elsewhere classified 07/02/2023 No Yes DARYA, BIGLER A ([id number]) [phone].pdf Page 5 of 8 I50.32 Chronic diastolic (congestive) heart failure 07/02/2023 No Yes E66.01 Morbid (severe) obesity due to excess calories 07/02/2023 No Yes Inactive Problems Resolved Problems ICD-10 Code Description Active Date Resolved Date L97.812 Non-pressure chronic ulcer of other part of right lower leg with fat layer exposed 07/02/2023 07/02/2023 Electronic Signature(s) Signed: 10/08/2023 10:07:55 AM By: Marolyn Nest MD FACS Entered By: Marolyn Nest on 10/08/2023  10:07:54 -------------------------------------------------------------------------------- Progress Note Details Patient Name: Date of Service: DELENA GUILLERMO [name], [name] 10/08/2023 9:30 A M Medical Record Number: [id number] Patient Account Number: [id number] Date of Birth/Sex: Treating RN: 1966-06-08 (58 y.o. F) Primary Care Provider: PCP, [name]: Referring Provider: Treating Provider/Extender: Marolyn Nest Duos in Treatment: 14 Subjective Chief Complaint Information obtained from Patient Patient presents for treatment of open ulcers due to lymphedema History of Present Illness (HPI) ADMISSION 07/02/2023 ***ABIs non-compressible bilaterally; triphasic Doppler signals*** This is a 58 year old morbidly obese patient who is not a diabetic. She does have hypertension and CHF. Although she does not carry this diagnosis in her electronic medical record, she clearly has lymphedema. She has multiple wounds on her bilateral lower extremities, 1 of which has been present for 3 years. She has been treating her wounds with peroxide and bacitracin. Her provider has given her various courses of antibiotics, but the wounds have not closed. She was told not to wear compression stockings due to her history of DVT but this was well over a year ago. She has never worn lymphedema pumps. , 07/09/2023: Several of the smaller wounds have closed. Remaining wounds include the left proximal medial ulcer and the left lateral distal ulcer, as well as the paired wounds on her right lateral lower leg. There is slough accumulation at all sites. 07/16/2023: The left distal lateral leg ulcer is a little bit smaller and quite a bit cleaner. The more proximal medial left lower leg ulcer still has a fair amount of nonviable tissue present and this more tender. Her wrap did slide down and it appears that it came to rest right in the middle of this wound causing some pressure on the site. The paired right lateral lower  leg ulcers are small and superficial with a little bit of slough and eschar present. 07/23/2023: The right lateral lower leg ulcers are nearly closed but remain just barely open underneath a layer of eschar. The proximal medial left lower leg ulcer has less nonviable tissue present today and is smaller. The left distal lateral leg ulcer is unchanged in size, but the surface is clean and the quality of the tissue is improving. 07/30/2023: Both  right leg ulcers are healed. The proximal medial left leg ulcer is smaller and more superficial. The left distal lateral leg ulcer is still unchanged in size. There is more granulation tissue filling in, but the surface is still fairly fibrotic. Edema control is adequate. 08/07/2023: She unfortunately had to travel out of town urgently to go to [place] this past week and as a result, was on her feet and with her legs in a dependent position quite a bit. This has resulted in the medial leg ulcer getting a little bit larger and the lateral leg ulcer getting a little bit deeper. Her juxta lite stockings were delivered and she has them with her today. She has been approved for Epifix so we will proceed with application today. 08/13/2023: The medial leg ulcer is quite a bit smaller this week and the lateral leg ulcer also measured slightly smaller but the tissue surface is healthier- looking. She has been on her feet again a lot this past week so her edema control remains poor. 08/20/2023: The medial leg ulcer is nearly healed with just a tiny open area remaining underneath a layer of eschar. The lateral leg ulcer is unchanged and the surface remains a bit fibrotic. Edema control is better this week. EBELYN, BOHNET A ([id number]) [id number] Page 6 of 8 12/3; the medial ulcer is healed. On the left lateral we still have a punched-out area with roughly 0.4 cm in depth. The granulation tissue at the base of this looks healthy. EpiFix  reapplied in the standard fashion. 12/12; the patient's medial left leg ulcer was healed however her Unna boot fell down this week and she has a new wound on the upper medial lower leg in the setting of uncontrolled swelling. The wound that we she had last week where we are putting epi fix is about the same but the swelling is just too much. She did not come in with any stocking on the right leg 09/14/2023: The medial leg ulcer is a little bit smaller today. The lateral leg ulcer continues to contract. There is still some depth to it and a thick layer of slough has accumulated. 09/27/2023: The medial leg ulcer is healed. The lateral leg ulcer is smaller today. There is still some depth to it but not much slough accumulation. Edema control is significantly improved. She is wearing her compression stocking on her right leg. 10/03/2023: The lateral leg ulcer continues to contract. There is a small amount of undermining at the more proximal aspect. Light slough on the surface. Edema control remains very good. She says that her lymphedema pumps are supposed to be delivered within the week. 10/08/2023: The ulcer measured the same today, but to my eye, it appears both shallower and a little bit smaller. Minimal slough on the surface. Edema control remains good. She has her lymphedema pumps and somebody from the company is coming out to show her how to use them this week. Objective Constitutional Hypertensive, asymptomatic. Tachycardic, asymptomatic. no acute distress. Vitals Time Taken: 9:48 AM, Height: 71 in, Weight: 352 lbs, BMI: 49.1, Temperature: 98.4 F, Pulse: 121 bpm, Respiratory Rate: 20 breaths/min, Blood Pressure: 195/86 mmHg. Respiratory Normal work of breathing on room air.. General Notes: 10/08/2023: The ulcer measured the same today, but to my eye, it appears both shallower and a little bit smaller. Minimal slough on the surface. Edema control remains good. Integumentary (Hair, Skin) Wound #6  status is Open. Original cause of wound was Gradually Appeared. The date acquired was:  08/09/2020. The wound has been in treatment 14 weeks. The wound is located on the Left,Lateral Lower Leg. The wound measures 0.9cm length x 0.5cm width x 0.3cm depth; 0.353cm^2 area and 0.106cm^3 volume. There is Fat Layer (Subcutaneous Tissue) exposed. There is no tunneling or undermining noted. There is a medium amount of serosanguineous drainage noted. The wound margin is distinct with the outline attached to the wound base. There is large (67-100%) red granulation within the wound bed. There is a small (1-33%) amount of necrotic tissue within the wound bed including Adherent Slough. The periwound skin appearance had no abnormalities noted for texture. The periwound skin appearance had no abnormalities noted for moisture. The periwound skin appearance exhibited: Hemosiderin Staining. Periwound temperature was noted as No Abnormality. The periwound has tenderness on palpation. Assessment Active Problems ICD-10 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osed Chronic venous hypertension (idiopathic) with ulcer of unspecified lower extremity Lymphedema, not elsewhere classified Chronic diastolic (congestive) heart failure Morbid (severe) obesity due to excess calories Procedures Wound #6 Pre-procedure diagnosis of Wound #6 is a Lymphedema located on the Left,Lateral Lower Leg . There was a Excisional Skin/Subcutaneous Tissue Debridement with a total area of 0.35 sq cm performed by Marolyn Nest, MD. With the following instrument(s): Curette to remove Viable and Non-Viable tissue/material. Material removed includes Eschar, Subcutaneous Tissue, and Slough after achieving pain control using Lidocaine 4% T opical Solution. No specimens were taken. A time out was conducted at 10:05, prior to the start of the procedure. A Minimum amount of bleeding was controlled with Pressure. The  procedure was tolerated well with a pain level of 0 throughout and a pain level of 0 following the procedure. Post Debridement Measurements: 0.9cm length x 0.5cm width x 0.3cm depth; 0.106cm^3 volume. Character of Wound/Ulcer Post Debridement is improved. Post procedure Diagnosis Wound #6: Same as Pre-Procedure Pre-procedure diagnosis of Wound #6 is a Lymphedema located on the Left,Lateral Lower Leg . There was a Radio Broadcast Assistant Compression Therapy Procedure by Boehlein, Linda, RN. Post procedure Diagnosis Wound #6: Same as Pre-Procedure SHAQUASHA, GERSTEL A ([id number]) [id number] Page 7 of 8 Plan Follow-up Appointments: Return Appointment in 1 week. - Dr. Marolyn 1/20 @ 09:30 am Anesthetic: (In clinic) Topical Lidocaine 4% applied to wound bed Cellular or Tissue Based Products: Wound #6 Left,Lateral Lower Leg: Cellular or Tissue Based Product Type: - Epifix #8 applied 10/03/23 Cellular or Tissue Based Product applied to wound bed, secured with steri-strips, cover with Adaptic or Mepitel. (DO NOT REMOVE). Bathing/ Shower/ Hygiene: May shower with protection but do not get wound dressing(s) wet. Protect dressing(s) with water repellant cover (for example, large plastic bag) or a cast cover and may then take shower. - Keep legs dry until next appointment. Use Cast Protectors if so desired. Additional Orders / Instructions: Follow Nutritious Diet - Try to increase protein intake. The goal is 70g-100g per day. Examples are chicken, fish, meat, pork, Greek yogurt, eggs etc. Lymphedema Treatment Plan - Exercise, Compression and Elevation: Exercise daily as tolerated. (Walking, ROM, Calf Pumps and T T oe aps) Compression Garments, Class II. Wear daily when out of bed. May remove at bedtime - right leg Compression Wraps as ordered Elevate legs 30 - 60 minutes at or above heart level at least 3 - 4 times daily as able/tolerated Avoid standing for long periods and elevate leg(s)  parallel to the floor when sitting Use Pneumatic Compression Device on leg(s) 2-3 times a day for 45-60 minutes -  grade 2 lymphedema with skin changes and chronic edema WOUND #6: - Lower Leg Wound Laterality: Left, Lateral Peri-Wound Care: Sween Lotion (Moisturizing lotion) 1 x Per Week/ Discharge Instructions: Apply moisturizing lotion as directed Prim Dressing: Promogran Prisma Matrix, 4.34 (sq in) (silver collagen) 1 x Per Week/ ary Discharge Instructions: Moisten collagen with saline or hydrogel Prim Dressing: Epifix 1 x Per Week/ ary Secondary Dressing: Drawtex 4x4 in 1 x Per Week/ Discharge Instructions: Apply over primary dressing as directed. Secondary Dressing: Woven Gauze Sponge, Non-Sterile 4x4 in 1 x Per Week/ Discharge Instructions: Apply over primary dressing as directed. Com pression Wrap: Unnaboot w/Calamine, 4x10 (in/yd) 1 x Per Week/ Discharge Instructions: Apply Unnaboot as directed. 10/08/2023: The ulcer measured the same today, but to my eye, it appears both shallower and a little bit smaller. Minimal slough on the surface. Edema control remains good. She has her lymphedema pumps and somebody from the company is coming out to show her how to use them this week. I used a curette to debride slough and subcutaneous tissue from the wound. Unfortunately, her EpiFix was not ordered this week so we do not have it available for application. We will use some Prisma silver collagen with drawtex as a backing underneath and a calamine based Unna boot. Will make sure to have her skin substitute available at her visit next week. She was encouraged to use her lymphedema pumps for an hour per day once she has received her orientation. Follow-up in 1 week's time. Electronic Signature(s) Signed: 10/08/2023 10:18:44 AM By: Marolyn Nest MD FACS Entered By: Marolyn Nest on 10/08/2023 10:18:44 -------------------------------------------------------------------------------- SuperBill  Details Patient Name: Date of Service: DELENA GUILLERMO [name], [name] 10/08/2023 Medical Record Number: [id number] Patient Account Number: [id number] Date of Birth/Sex: Treating RN: 1966-05-27 (58 y.o. F) Primary Care Provider: PCP, [name]: Referring Provider: Treating Provider/Extender: Marolyn Nest Duos in Treatment: 14 Diagnosis Coding ICD-10 Codes Code Description [phone] 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osed I87.319 Chronic venous hypertension (idiopathic) with ulcer of unspecified lower extremity I89.0 Lymphedema, not elsewhere classified DARCEE, DEKKER A ([id number]) [id number].pdf Page 8 of 8 I50.32 Chronic diastolic (congestive) heart failure E66.01 Morbid (severe) obesity due to excess calories Facility Procedures : CPT4 Code: [id number] 1 Description: 1042 - DEB SUBQ TISSUE 20 SQ CM/< ICD-10 Diagnosis Description L97.822 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 I87.319 Chronic venous hypertension (idiopathic) with ulcer of unspecified lower extr I89.0  Lymphedema, not elsewhere classified I50.32 Chronic diastolic (congestive) heart failure Modifier: osed emity Quantity: 1 Physician Procedures : CPT4 Code Description Modifier [id number] 11042 - WC PHYS SUBQ TISS 20 SQ CM ICD-10 Diagnosis Description L97.822 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osed I87.319 Chronic venous hypertension (idiopathic) with ulcer  of unspecified lower extremity I89.0 Lymphedema, not elsewhere classified I50.32 Chronic diastolic (congestive) heart failure Quantity: 1 Electronic Signature(s) Signed: 10/08/2023 10:28:31 AM By: Marolyn Nest MD FACS Entered By: Marolyn Nest on 10/08/2023 10:28:31

## 2023-10-10 NOTE — Progress Notes (Signed)
 KEALOHILANI, MAIORINO A ([id number]) [id number] Page 1 of 7 Visit Report for 10/08/2023 Arrival Information Details Patient Name: Date of Service: [name], [name] 10/08/2023 9:30 A M Medical Record Number: [id number] Patient Account Number: [id number] Date of Birth/Sex: Treating RN: 1966-02-20 (58 y.o. F) Primary Care Joshawn Crissman: PCP, [name]: Referring Amos Micheals: Treating Yelena Metzer/Extender: Marolyn Delon Duos in Treatment: 14 Visit Information History Since Last Visit Added or deleted any medications: No Patient Arrived: Cane Any new allergies or adverse reactions: No Arrival Time: 09:48 Had a fall or experienced change in No Accompanied By: self activities of daily living that may affect Transfer Assistance: None risk of falls: Patient Identification Verified: Yes Signs or symptoms of abuse/neglect since last visito No Secondary Verification Process Completed: Yes Hospitalized since last visit: No Patient Requires Transmission-Based Precautions: No Implantable device outside of the clinic excluding No Patient Has Alerts: Yes cellular tissue based products placed in the center Patient Alerts: Lasix  since last visit: ABI R [place] (07/02/23) Has Dressing in Place as Prescribed: Yes ABI L [place] (07/02/23) Has Compression in Place as Prescribed: Yes Pain Present Now: No Electronic Signature(s) Signed: 10/08/2023 5:28:04 PM By: Merleen Handing RN, BSN Entered By: Boehlein, Linda on 10/08/2023 09:51:01 -------------------------------------------------------------------------------- Compression Therapy Details Patient Name: Date of Service: Joan GUILLERMO [name], [name] 10/08/2023 9:30 A M Medical Record Number: [id number] Patient Account Number: [id number] Date of Birth/Sex: Treating RN: August 17, 1966 (58 y.o. [name] Merleen Handing Primary Care Samyukta Cura: PCP, [name]: Referring Dmitriy Gair: Treating Sharif Rendell/Extender: Marolyn Delon Duos in Treatment:  14 Compression Therapy Performed for Wound Assessment: Wound #6 Left,Lateral Lower Leg Performed By: Clinician Merleen Handing, RN Compression Type: Nonie Seaman Post Procedure Diagnosis Same as Pre-procedure Electronic Signature(s) Signed: 10/08/2023 5:28:04 PM By: Merleen Handing RN, BSN Entered By: Boehlein, Linda on 10/08/2023 09:52:00 MARYLEN SHUCK Joan ([id number]) [id number] Page 2 of 7 -------------------------------------------------------------------------------- Encounter Discharge Information Details Patient Name: Date of Service: A [name], [name] 10/08/2023 9:30 A M Medical Record Number: [id number] Patient Account Number: [id number] Date of Birth/Sex: Treating RN: 1966/05/12 (58 y.o. [name] Merleen Handing Primary Care Tayjah Lobdell: PCP, [name]: Referring Jerae Izard: Treating Jannelle Notaro/Extender: Marolyn Delon Duos in Treatment: 14 Encounter Discharge Information Items Post Procedure Vitals Discharge Condition: Stable Temperature (F): 98.4 Ambulatory Status: Cane Pulse (bpm): 112 Discharge Destination: Home Respiratory Rate (breaths/min): 18 Transportation: Private Auto Blood Pressure (mmHg): 195/86 Accompanied By: self Schedule Follow-up Appointment: Yes Clinical Summary of Care: Patient Declined Electronic Signature(s) Signed: 10/08/2023 5:28:04 PM By: Merleen Handing RN, BSN Entered By: Merleen Handing on 10/08/2023 10:25:47 -------------------------------------------------------------------------------- Lower Extremity Assessment Details Patient Name: Date of Service: Joan GUILLERMO [name], [name] 10/08/2023 9:30 A M Medical Record Number: [id number] Patient Account Number: [id number] Date of Birth/Sex: Treating RN: 08-02-1966 (58 y.o. [name] Merleen Handing Primary Care Sadae Arrazola: PCP, [name]: Referring Heath Badon: Treating Thayer Embleton/Extender: Marolyn Delon Duos in Treatment: 14 Edema Assessment Assessed: [Left: No] [Right: No] Edema:  [Left: Ye] [Right: s] Calf Left: Right: Point of Measurement: 32 cm From Medial Instep 41.3 cm Ankle Left: Right: Point of Measurement: 10 cm From Medial Instep 29 cm Vascular Assessment Pulses: Dorsalis Pedis Palpable: [Left:Yes] Extremity colors, hair growth, and conditions: Extremity Color: [Left:Hyperpigmented] Hair Growth on Extremity: [Left:No] Temperature of Extremity: [Left:Warm] Capillary Refill: [Left:< 3 seconds] Dependent Rubor: [Left:No No] Electronic Signature(s) Signed: 10/08/2023 5:28:04 PM By: Merleen Handing RN, BSN Entered By: Merleen Handing on 10/08/2023 09:59:45 MARYLEN SHUCK Joan ([id number]) [id number] Page 3 of 7 -------------------------------------------------------------------------------- Multi  Wound Chart Details Patient Name: Date of Service: A [name], [name] 10/08/2023 9:30 A M Medical Record Number: [id number] Patient Account Number: [id number] Date of Birth/Sex: Treating RN: 27-May-1966 (58 y.o. F) Primary Care Lillyth Spong: PCP, [name]: Referring Zuriah Bordas: Treating Troye Hiemstra/Extender: Marolyn Delon Duos in Treatment: 14 Vital Signs Height(in): 71 Pulse(bpm): 121 Weight(lbs): 352 Blood Pressure(mmHg): 195/86 Body Mass Index(BMI): 49.1 Temperature(F): 98.4 Respiratory Rate(breaths/min): 20 [6:Photos:] [N/A:N/A] Left, Lateral Lower Leg N/A N/A Wound Location: Gradually Appeared N/A N/A Wounding Event: Lymphedema N/A N/A Primary Etiology: Anemia, Asthma, Congestive Heart N/A N/A Comorbid History: Failure, Deep Vein Thrombosis, Hypertension, Osteoarthritis 08/09/2020 N/A N/A Date Acquired: 14 N/A N/A Weeks of Treatment: Open N/A N/A Wound Status: No N/A N/A Wound Recurrence: 0.9x0.5x0.3 N/A N/A Measurements L x W x D (cm) 0.353 N/A N/A A (cm) : rea 0.106 N/A N/A Volume (cm) : 90.20% N/A N/A % Reduction in A rea: 90.20% N/A N/A % Reduction in Volume: Full Thickness Without Exposed N/A  N/A Classification: Support Structures Medium N/A N/A Exudate A mount: Serosanguineous N/A N/A Exudate Type: red, brown N/A N/A Exudate Color: Distinct, outline attached N/A N/A Wound Margin: Large (67-100%) N/A N/A Granulation A mount: Red N/A N/A Granulation Quality: Small (1-33%) N/A N/A Necrotic A mount: Fat Layer (Subcutaneous Tissue): Yes N/A N/A Exposed Structures: Fascia: No Tendon: No Muscle: No Joint: No Bone: No Small (1-33%) N/A N/A Epithelialization: Debridement - Excisional N/A N/A Debridement: Pre-procedure Verification/Time Out 10:05 N/A N/A Taken: Lidocaine 4% Topical Solution N/A N/A Pain Control: Necrotic/Eschar, Subcutaneous, N/A N/A Tissue Debrided: Slough Skin/Subcutaneous Tissue N/A N/A Level: 0.35 N/A N/A Debridement A (sq cm): rea Curette N/A N/A Instrument: Minimum N/A N/A Bleeding: Pressure N/A N/A Hemostasis Achieved: 0 N/A N/A Procedural Pain: 0 N/A N/A Post Procedural Pain: Debridement Treatment Response: Procedure was tolerated well N/A N/A Post Debridement Measurements L x 0.9x0.5x0.3 N/A N/A W x D (cm) RIANE, RUNG A ([id number]) [id number] Page 4 of 7 0.106 N/A N/A Post Debridement Volume: (cm) Scarring: Yes N/A N/A Periwound Skin Texture: No Abnormalities Noted N/A N/A Periwound Skin Moisture: Hemosiderin Staining: Yes N/A N/A Periwound Skin Color: No Abnormality N/A N/A Temperature: Yes N/A N/A Tenderness on Palpation: Compression Therapy N/A N/A Procedures Performed: Debridement Treatment Notes Electronic Signature(s) Signed: 10/08/2023 10:08:36 AM By: Marolyn Delon MD FACS Entered By: Marolyn Delon on 10/08/2023 10:08:36 -------------------------------------------------------------------------------- Multi-Disciplinary Care Plan Details Patient Name: Date of Service: Joan GUILLERMO [name], [name] 10/08/2023 9:30 A M Medical Record Number: [id number] Patient Account Number:  [id number] Date of Birth/Sex: Treating RN: [birth year] (58 y.o. [name] Merleen Handing Primary Care Davius Goudeau: PCP, [name]: Referring Dwayn Moravek: Treating Wladyslawa Disbro/Extender: Marolyn Delon Duos in Treatment: 14 Multidisciplinary Care Plan reviewed with physician Active Inactive Venous Leg Ulcer Nursing Diagnoses: Actual venous Insuffiency (use after diagnosis is confirmed) Knowledge deficit related to disease process and management Goals: Patient will maintain optimal edema control Date Initiated: 08/29/2023 Target Resolution Date: 10/25/2023 Goal Status: Active Interventions: Assess peripheral edema status every visit. Compression as ordered Provide education on venous insufficiency Treatment Activities: Therapeutic compression applied : 08/29/2023 Notes: Wound/Skin Impairment Nursing Diagnoses: Impaired tissue integrity Goals: Patient/caregiver will verbalize understanding of skin care regimen Date Initiated: 07/02/2023 Target Resolution Date: 10/25/2023 Goal Status: Active Interventions: Assess ulceration(s) every visit Treatment Activities: Skin care regimen initiated : 07/02/2023 Notes: NYLAN, NAKATANI ([id number]) [phone].pdf Page 5 of 7 Electronic Signature(s) Signed: 10/08/2023 5:28:04 PM By: Merleen Handing RN, BSN Entered By: Boehlein, Linda on 10/08/2023 09:51:22 --------------------------------------------------------------------------------  Pain Assessment Details Patient Name: Date of Service: A [name], [name] 10/08/2023 9:30 A M Medical Record Number: [id number] Patient Account Number: [id number] Date of Birth/Sex: Treating RN: 03-16-1966 (58 y.o. F) Primary Care Penny Frisbie: PCP, [name]: Referring Tianna Baus: Treating Zamyah Wiesman/Extender: Marolyn Delon Duos in Treatment: 14 Active Problems Location of Pain Severity and Description of Pain Patient Has Paino No Site Locations Rate the pain. Current Pain Level:  0 Pain Management and Medication Current Pain Management: Electronic Signature(s) Signed: 10/08/2023 5:28:04 PM By: Merleen Handing RN, BSN Entered By: Merleen Handing on 10/08/2023 09:54:34 -------------------------------------------------------------------------------- Patient/Caregiver Education Details Patient Name: Date of Service: Joan GUILLERMO [name], [name] 1/13/2025andnbsp9:30 A M Medical Record Number: [id number] Patient Account Number: [id number] Date of Birth/Gender: Treating RN: 12-29-1965 (58 y.o. [name] Merleen Handing Primary Care Physician: PCP, [name]: Referring Physician: Treating Physician/Extender: Marolyn Delon Duos in Treatment: 14 Education Assessment Education Provided To: Patient VIVIKA, POYTHRESS ([id number]) [id number] Page 6 of 7 Education Topics Provided Venous: Methods: Explain/Verbal Responses: Reinforcements needed, State content correctly Electronic Signature(s) Signed: 10/08/2023 5:28:04 PM By: Merleen Handing RN, BSN Entered By: Merleen Handing on 10/08/2023 09:51:35 -------------------------------------------------------------------------------- Wound Assessment Details Patient Name: Date of Service: Joan GUILLERMO [name], [name] 10/08/2023 9:30 A M Medical Record Number: [id number] Patient Account Number: [id number] Date of Birth/Sex: Treating RN: [birth year] (58 y.o. [name] Merleen Handing Primary Care Janay Canan: PCP, [name]: Referring Onyx Edgley: Treating Jourden Gilson/Extender: Marolyn Delon Duos in Treatment: 14 Wound Status Wound Number: 6 Primary Lymphedema Etiology: Wound Location: Left, Lateral Lower Leg Wound Open Wounding Event: Gradually Appeared Status: Date Acquired: 08/09/2020 Comorbid Anemia, Asthma, Congestive Heart Failure, Deep Vein Weeks Of Treatment: 14 History: Thrombosis, Hypertension, Osteoarthritis Clustered Wound: No Photos Wound Measurements Length: (cm) 0.9 Width: (cm) 0.5 Depth: (cm)  0.3 Area: (cm) 0.353 Volume: (cm) 0.106 % Reduction in Area: 90.2% % Reduction in Volume: 90.2% Epithelialization: Small (1-33%) Tunneling: No Undermining: No Wound Description Classification: Full Thickness Without Exposed Suppor Wound Margin: Distinct, outline attached Exudate Amount: Medium Exudate Type: Serosanguineous Exudate Color: red, brown t Structures Foul Odor After Cleansing: No Slough/Fibrino Yes Wound Bed Granulation Amount: Large (67-100%) Exposed Structure Granulation Quality: Red Fascia Exposed: No Necrotic Amount: Small (1-33%) Fat Layer (Subcutaneous Tissue) Exposed: Yes Necrotic Quality: Adherent Slough Tendon Exposed: No Muscle Exposed: No Joint Exposed: No Bone Exposed: No MIEKE, BRINLEY A ([id number]) [id number] Page 7 of 7 Periwound Skin Texture Texture Color No Abnormalities Noted: Yes No Abnormalities Noted: No Hemosiderin Staining: Yes Moisture No Abnormalities Noted: Yes Temperature / Pain Temperature: No Abnormality Tenderness on Palpation: Yes Treatment Notes Wound #6 (Lower Leg) Wound Laterality: Left, Lateral Cleanser Peri-Wound Care Sween Lotion (Moisturizing lotion) Discharge Instruction: Apply moisturizing lotion as directed Topical Primary Dressing Promogran Prisma Matrix, 4.34 (sq in) (silver collagen) Discharge Instruction: Moisten collagen with saline or hydrogel Epifix Secondary Dressing Drawtex 4x4 in Discharge Instruction: Apply over primary dressing as directed. Woven Gauze Sponge, Non-Sterile 4x4 in Discharge Instruction: Apply over primary dressing as directed. Secured With Compression Wrap Unnaboot w/Calamine, 4x10 (in/yd) Discharge Instruction: Apply Unnaboot as directed. Compression Stockings Add-Ons Electronic Signature(s) Signed: 10/08/2023 5:28:04 PM By: Merleen Handing RN, BSN Entered By: Boehlein, Linda on 10/08/2023  09:59:07 -------------------------------------------------------------------------------- Vitals Details Patient Name: Date of Service: Joan GUILLERMO [name], [name] 10/08/2023 9:30 A M Medical Record Number: [id number] Patient Account Number: [id number] Date of Birth/Sex: Treating RN: 10/17/1965 (58 y.o. F) Primary Care Jie Stickels: PCP, [name]: Referring Hafsa Lohn: Treating Arwin Bisceglia/Extender: Marolyn,  Delon Duos in Treatment: 14 Vital Signs Time Taken: 09:48 Temperature (F): 98.4 Height (in): 71 Pulse (bpm): 121 Weight (lbs): 352 Respiratory Rate (breaths/min): 20 Body Mass Index (BMI): 49.1 Blood Pressure (mmHg): 195/86 Reference Range: 80 - 120 mg / dl Electronic Signature(s) Signed: 10/08/2023 5:28:04 PM By: Merleen Handing RN, BSN Entered By: Boehlein, Linda on 10/08/2023 09:54:26

## 2023-10-15 ENCOUNTER — Encounter (HOSPITAL_BASED_OUTPATIENT_CLINIC_OR_DEPARTMENT_OTHER): Payer: Medicare HMO | Admitting: General Surgery

## 2023-10-15 DIAGNOSIS — I87312 Chronic venous hypertension (idiopathic) with ulcer of left lower extremity: Secondary | ICD-10-CM | POA: Diagnosis not present

## 2023-10-15 NOTE — Progress Notes (Signed)
[name], [name] ([id number]) [id number] Page 1 of 8 Visit Report for 10/15/2023 Arrival Information Details Patient Name: Date of Service: [name], [name] 10/15/2023 9:30 [name] Medical Record Number: [id number] Patient Account Number: [id number] Date of Birth/Sex: Treating RN: 03/14/1966 (58 y.o. F) Primary Care Colisha Redler: PCP, [name]: Referring Dameer Speiser: Treating Rashema Seawright/Extender: Sherryl Manges in Treatment: 15 Visit Information History Since Last Visit Added or deleted any medications: No Patient Arrived: Cane Any new allergies or adverse reactions: No Arrival Time: 09:50 Had [name] fall or experienced change in No Accompanied By: self activities of daily living that may affect Transfer Assistance: None risk of falls: Patient Identification Verified: Yes Signs or symptoms of abuse/neglect since last visito No Secondary Verification Process Completed: Yes Hospitalized since last visit: No Patient Requires Transmission-Based Precautions: No Implantable device outside of the clinic excluding No Patient Has Alerts: Yes cellular tissue based products placed in the center Patient Alerts: Lasix since last visit: ABI R [place] (07/02/23) Has Dressing in Place as Prescribed: Yes ABI L  (07/02/23) Has Compression in Place as Prescribed: Yes Pain Present Now: No Electronic Signature(s) Signed: 10/15/2023 5:16:53 PM By: Zenaida Deed RN, BSN Entered By: Zenaida Deed on 10/15/2023 09:58:31 -------------------------------------------------------------------------------- Compression Therapy Details Patient Name: Date of Service: Joan Officer, [name] 10/15/2023 9:30 [name] Medical Record Number: [id number] Patient Account Number: [id number] Date of Birth/Sex: Treating RN: [birth year] (58 y.o. [name] Primary Care Yasiel Goyne: PCP, [name]: Referring Aliz Meritt: Treating Era Parr/Extender: Sherryl Manges in Treatment:  15 Compression Therapy Performed for Wound Assessment: Wound #6 Left,Lateral Lower Leg Performed By: Clinician Zenaida Deed, RN Compression Type: Henriette Combs Post Procedure Diagnosis Same as Pre-procedure Electronic Signature(s) Signed: 10/15/2023 5:16:53 PM By: Zenaida Deed RN, BSN Entered By: Zenaida Deed on 10/15/2023 10:13:18 [name] ([id number]) [id number] Page 2 of 8 -------------------------------------------------------------------------------- Encounter Discharge Information Details Patient Name: Date of Service: [name], [place] AY [name]. 10/15/2023 9:30 [name] Medical Record Number: [id number] Patient Account Number: [id number] Date of Birth/Sex: Treating RN: [birth year] (58 y.o. [name] Primary Care Dameon Soltis: PCP, [name]: Referring Izaia Say: Treating Shawntavia Saunders/Extender: Sherryl Manges in Treatment: 15 Encounter Discharge Information Items Post Procedure Vitals Discharge Condition: Stable Temperature (F): 98.2 Ambulatory Status: Ambulatory Pulse (bpm): 116 Discharge Destination: Home Respiratory Rate (breaths/min): 18 Transportation: Private Auto Blood Pressure (mmHg): 172/105 Accompanied By: self Schedule Follow-up Appointment: Yes Clinical Summary of Care: Patient Declined Electronic Signature(s) Signed: 10/15/2023 5:16:53 PM By: Zenaida Deed RN, BSN Entered By: Zenaida Deed on 10/15/2023 10:28:20 -------------------------------------------------------------------------------- Lower Extremity Assessment Details Patient Name: Date of Service: Joan Officer, [name] 10/15/2023 9:30 [name] Medical Record Number: [id number] Patient Account Number: [id number] Date of Birth/Sex: Treating RN: [birth year] (58 y.o. [name] Primary Care Coleen Cardiff: PCP, [name]: Referring Cardin Nitschke: Treating Cabrina Shiroma/Extender: Sherryl Manges in Treatment: 15 Edema Assessment Assessed: [Left: No] [Right:  No] Edema: [Left: Ye] [Right: s] Calf Left: Right: Point of Measurement: 32 cm From Medial Instep 41.5 cm Ankle Left: Right: Point of Measurement: 10 cm From Medial Instep 29 cm Vascular Assessment Pulses: Dorsalis Pedis Palpable: [Left:Yes] Extremity colors, hair growth, and conditions: Extremity Color: [Left:Hyperpigmented] Hair Growth on Extremity: [Left:No] Temperature of Extremity: [Left:Warm] Capillary Refill: [Left:< 3 seconds] Dependent Rubor: [Left:No No] Toe Nail Assessment Left: Right: Thick: Yes Discolored: Yes Deformed: Yes Improper Length and Hygiene: LENDSEY, WEDEKING [name] ([id number]) [id number] Page 3 of 8 Electronic Signature(s) Signed: 10/15/2023  5:16:53 PM By: Zenaida Deed RN, BSN Entered By: Zenaida Deed on 10/15/2023 10:00:54 -------------------------------------------------------------------------------- Multi Wound Chart Details Patient Name: Date of Service: Joan Officer, [name] 10/15/2023 9:30 [name] Medical Record Number: [id number] Patient Account Number: [id number] Date of Birth/Sex: Treating RN: Jul 21, 1966 (58 y.o. F) Primary Care Bryndan Bilyk: PCP, [name]: Referring Ashliegh Parekh: Treating Jazzmyne Rasnick/Extender: Sherryl Manges in Treatment: 15 Vital Signs Height(in): 71 Pulse(bpm): 116 Weight(lbs): 352 Blood Pressure(mmHg): 172/105 Body Mass Index(BMI): 49.1 Temperature(F): 98.2 Respiratory Rate(breaths/min): 20 [6:Photos:] [N/[name]] Left, Lateral Lower Leg N/[name] N/[name] Wound Location: Gradually Appeared N/[name] N/[name] Wounding Event: Lymphedema N/[name] N/[name] Primary Etiology: Anemia, Asthma, Congestive Heart N/[name] N/[name] Comorbid History: Failure, Deep Vein Thrombosis, Hypertension, Osteoarthritis 08/09/2020 N/[name] N/[name] Date Acquired: 15 N/[name] N/[name] Weeks of Treatment: Open N/[name] N/[name] Wound Status: No N/[name] N/[name] Wound Recurrence: 0.7x0.5x0.2 N/[name] N/[name] Measurements L x W x D (cm) 0.275 N/[name] N/[name] [name] (cm) : rea 0.055 N/[name] N/[name] Volume  (cm) : 92.40% N/[name] N/[name] % Reduction in [name] rea: 94.90% N/[name] N/[name] % Reduction in Volume: Full Thickness Without Exposed N/[name] N/[name] Classification: Support Structures Small N/[name] N/[name] Exudate [name] mount: Serosanguineous N/[name] N/[name] Exudate Type: red, brown N/[name] N/[name] Exudate Color: Distinct, outline attached N/[name] N/[name] Wound Margin: Large (67-100%) N/[name] N/[name] Granulation [name] mount: Red N/[name] N/[name] Granulation Quality: Small (1-33%) N/[name] N/[name] Necrotic [name] mount: Fat Layer (Subcutaneous Tissue): Yes N/[name] N/[name] Exposed Structures: Fascia: No Tendon: No Muscle: No Joint: No Bone: No Medium (34-66%) N/[name] N/[name] Epithelialization: Debridement - Excisional N/[name] N/[name] Debridement: Pre-procedure Verification/Time Out 10:10 N/[name] N/[name] Taken: Lidocaine 4% Topical Solution N/[name] N/[name] Pain Control: Necrotic/Eschar, Subcutaneous, N/[name] N/[name] Tissue Debrided: Slough Skin/Subcutaneous Tissue N/[name] N/[name] Level: 0.27 N/[name] N/[name] Debridement [name] (sq cm): rea Curette N/[name] N/[name] [employer], [name] [name] ([id number]) [id number] Page 4 of 8 Minimum N/[name] N/[name] Bleeding: Pressure N/[name] N/[name] Hemostasis [name] chieved: 2 N/[name] N/[name] Procedural Pain: 1 N/[name] N/[name] Post Procedural Pain: Procedure was tolerated well N/[name] N/[name] Debridement Treatment Response: 0.7x0.5x0.2 N/[name] N/[name] Post Debridement Measurements L x W x D (cm) 0.055 N/[name] N/[name] Post Debridement Volume: (cm) Scarring: Yes N/[name] N/[name] Periwound Skin Texture: No Abnormalities Noted N/[name] N/[name] Periwound Skin Moisture: Hemosiderin Staining: Yes N/[name] N/[name] Periwound Skin Color: No Abnormality N/[name] N/[name] Temperature: Yes N/[name] N/[name] Tenderness on Palpation: Compression Therapy N/[name] N/[name] Procedures Performed: Debridement Treatment Notes Electronic Signature(s) Signed: 10/15/2023 10:16:37 AM By: Duanne Guess MD FACS Entered By: Duanne Guess on 10/15/2023 10:16:37 -------------------------------------------------------------------------------- Multi-Disciplinary Care Plan Details Patient Name:  Date of Service: Joan Officer, [name] 10/15/2023 9:30 [name] Medical Record Number: [id number] Patient Account Number: [id number] Date of Birth/Sex: Treating RN: 1966/04/21 (57 y.o. [name] Primary Care Ryver Poblete: PCP, [name]: Referring Arminda Foglio: Treating Arcola Freshour/Extender: Sherryl Manges in Treatment: 15 Multidisciplinary Care Plan reviewed with physician Active Inactive Venous Leg Ulcer Nursing Diagnoses: Actual venous Insuffiency (use after diagnosis is confirmed) Knowledge deficit related to disease process and management Goals: Patient will maintain optimal edema control Date Initiated: 08/29/2023 Target Resolution Date: 10/25/2023 Goal Status: Active Interventions: Assess peripheral edema status every visit. Compression as ordered Provide education on venous insufficiency Treatment Activities: Therapeutic compression applied : 08/29/2023 Notes: Wound/Skin Impairment Nursing Diagnoses: Impaired tissue integrity Goals: Patient/caregiver will verbalize understanding of skin care regimen Date Initiated: 07/02/2023 Target Resolution Date: 10/25/2023 Goal Status: Active Interventions: [name], [name] ([id number]) [phone].pdf Page 5 of 8 Assess ulceration(s) every visit Treatment Activities: Skin care regimen initiated : 07/02/2023 Notes: Electronic Signature(s)  Signed: 10/15/2023 5:16:53 PM By: Zenaida Deed RN, BSN Entered By: Zenaida Deed on 10/15/2023 10:03:58 -------------------------------------------------------------------------------- Pain Assessment Details Patient Name: Date of Service: Joan Officer, [name] 10/15/2023 9:30 [name] Medical Record Number: [id number] Patient Account Number: [id number] Date of Birth/Sex: Treating RN: 27-Nov-1965 (58 y.o. F) Primary Care Christee Mervine: PCP, [name]: Referring Judas Mohammad: Treating Saprina Chuong/Extender: Sherryl Manges in Treatment: 15 Active Problems Location of  Pain Severity and Description of Pain Patient Has Paino No Site Locations Rate the pain. Current Pain Level: 0 Pain Management and Medication Current Pain Management: Electronic Signature(s) Signed: 10/15/2023 5:16:53 PM By: Zenaida Deed RN, BSN Entered By: Zenaida Deed on 10/15/2023 09:58:53 -------------------------------------------------------------------------------- Patient/Caregiver Education Details Patient Name: Date of Service: Joan Officer, [name] 1/20/2025andnbsp9:30 [name] Medical Record Number: [id number] Patient Account Number: [id number] Date of Birth/Gender: Treating RN: 03-09-1966 (58 y.o. [name] Primary Care Physician: PCP, [name]: Referring Physician: Treating Physician/Extender: Sherryl Manges in Treatment: [address] [name] ([id number]) [id number] Page 6 of 8 Education Assessment Education Provided To: Patient Education Topics Provided Venous: Methods: Explain/Verbal Responses: Reinforcements needed, State content correctly Wound/Skin Impairment: Methods: Explain/Verbal Responses: Reinforcements needed, State content correctly Electronic Signature(s) Signed: 10/15/2023 5:16:53 PM By: Zenaida Deed RN, BSN Entered By: Zenaida Deed on 10/15/2023 10:04:19 -------------------------------------------------------------------------------- Wound Assessment Details Patient Name: Date of Service: Joan Officer, [name] 10/15/2023 9:30 [name] Medical Record Number: [id number] Patient Account Number: [id number] Date of Birth/Sex: Treating RN: 06-20-1966 (58 y.o. F) Primary Care Montague Corella: PCP, [name]: Referring Jennilee Demarco: Treating Sachit Gilman/Extender: Sherryl Manges in Treatment: 15 Wound Status Wound Number: 6 Primary Lymphedema Etiology: Wound Location: Left, Lateral Lower Leg Wound Open Wounding Event: Gradually Appeared Status: Date Acquired: 08/09/2020 Comorbid Anemia, Asthma,  Congestive Heart Failure, Deep Vein Weeks Of Treatment: 15 History: Thrombosis, Hypertension, Osteoarthritis Clustered Wound: No Photos Wound Measurements Length: (cm) 0.7 Width: (cm) 0.5 Depth: (cm) 0.2 Area: (cm) 0.275 Volume: (cm) 0.055 % Reduction in Area: 92.4% % Reduction in Volume: 94.9% Epithelialization: Medium (34-66%) Tunneling: No Undermining: No Wound Description Classification: Full Thickness Without Exposed Support Wound Margin: Distinct, outline attached Exudate Amount: Small Exudate Type: Serosanguineous Exudate Color: red, brown [name], [name] ([id number]) Structures Foul Odor After Cleansing: No Slough/Fibrino Yes [phone].pdf Page 7 of 8 Wound Bed Granulation Amount: Large (67-100%) Exposed Structure Granulation Quality: Red Fascia Exposed: No Necrotic Amount: Small (1-33%) Fat Layer (Subcutaneous Tissue) Exposed: Yes Necrotic Quality: Adherent Slough Tendon Exposed: No Muscle Exposed: No Joint Exposed: No Bone Exposed: No Periwound Skin Texture Texture Color No Abnormalities Noted: Yes No Abnormalities Noted: No Hemosiderin Staining: Yes Moisture No Abnormalities Noted: Yes Temperature / Pain Temperature: No Abnormality Tenderness on Palpation: Yes Treatment Notes Wound #6 (Lower Leg) Wound Laterality: Left, Lateral Cleanser Peri-Wound Care Sween Lotion (Moisturizing lotion) Discharge Instruction: Apply moisturizing lotion as directed Topical Primary Dressing Promogran Prisma Matrix, 4.34 (sq in) (silver collagen) Discharge Instruction: Moisten collagen with saline or hydrogel Secondary Dressing Drawtex 4x4 in Discharge Instruction: Apply over primary dressing as directed. Woven Gauze Sponge, Non-Sterile 4x4 in Discharge Instruction: Apply over primary dressing as directed. Secured With Compression Wrap Unnaboot w/Calamine, 4x10 (in/yd) Discharge Instruction: Apply Unnaboot as directed. Compression  Stockings Add-Ons Electronic Signature(s) Signed: 10/15/2023 5:16:53 PM By: Zenaida Deed RN, BSN Entered By: Zenaida Deed on 10/15/2023 10:03:35 -------------------------------------------------------------------------------- Vitals Details Patient Name: Date of Service: Joan Officer, [name] 10/15/2023 9:30 [name] Medical Record Number: [id number] Patient Account  Number: [id number] Date of Birth/Sex: Treating RN: June 16, 1966 (58 y.o. F) Primary Care Allona Gondek: PCP, [name]: Referring Aigner Horseman: Treating Zacari Radick/Extender: Sherryl Manges in Treatment: 15 Vital Signs Time Taken: 09:51 Temperature (F): 98.2 Height (in): 71 Pulse (bpm): 116 [name], [name] ([id number]) [id number].pdf Page 8 of 8 Weight (lbs): 352 Respiratory Rate (breaths/min): 20 Body Mass Index (BMI): 49.1 Blood Pressure (mmHg): 172/105 Reference Range: 80 - 120 mg / dl Electronic Signature(s) Signed: 10/15/2023 5:16:53 PM By: Zenaida Deed RN, BSN Entered By: Zenaida Deed on 10/15/2023 10:01:35

## 2023-10-15 NOTE — Progress Notes (Signed)
Joan, AUBUT [name] ([id number]) [id number] Page 1 of 8 Visit Report for 10/15/2023 Chief Complaint Document Details Patient Name: Date of Service: [name], [name] 10/15/2023 9:30 [name] Medical Record Number: [id number] Patient Account Number: [id number] Date of Birth/Sex: Treating RN: June 03, 1966 (58 y.o. F) Primary Care Provider: PCP, [name]: Referring Provider: Treating Provider/Extender: [name] in Treatment: 15 Information Obtained from: Patient Chief Complaint Patient presents for treatment of open ulcers due to lymphedema Electronic Signature(s) Signed: 10/15/2023 10:17:40 AM By: Joan Guess MD FACS Entered By: [name] on 10/15/2023 10:17:40 -------------------------------------------------------------------------------- Debridement Details Patient Name: Date of Service: Joan Officer, [name] 10/15/2023 9:30 [name] Medical Record Number: [id number] Patient Account Number: [id number] Date of Birth/Sex: Treating RN: 11-Dec-1965 (58 y.o. [name] Primary Care Provider: PCP, [name]: Referring Provider: Treating Provider/Extender: [name] in Treatment: 15 Debridement Performed for Assessment: Wound #6 Left,Lateral Lower Leg Performed By: Physician Joan Guess, MD The following information was scribed by: [name] The information was scribed for: [name] Debridement Type: Debridement Level of Consciousness (Pre-procedure): Awake and Alert Pre-procedure Verification/Time Out Yes - 10:10 Taken: Start Time: 10:11 Pain Control: Lidocaine 4% T opical Solution Percent of Wound Bed Debrided: 100% T Area Debrided (cm): otal 0.27 Tissue and other material debrided: Viable, Non-Viable, Eschar, Slough, Subcutaneous, Slough Level: Skin/Subcutaneous Tissue Debridement Description: Excisional Instrument: Curette Bleeding: Minimum Hemostasis Achieved: Pressure Procedural Pain:  2 Post Procedural Pain: 1 Response to Treatment: Procedure was tolerated well Level of Consciousness (Post- Awake and Alert procedure): Post Debridement Measurements of Total Wound Length: (cm) 0.7 Width: (cm) 0.5 Depth: (cm) 0.2 Volume: (cm) 0.055 Character of Wound/Ulcer Post Debridement: Improved [name], [name] ([id number]) [phone] Page 2 of 8 Post Procedure Diagnosis Same as Pre-procedure Electronic Signature(s) Signed: 10/15/2023 2:59:12 PM By: Joan Guess MD FACS Signed: 10/15/2023 5:16:53 PM By: Joan Deed RN, BSN Entered By: [name] on 10/15/2023 10:13:01 -------------------------------------------------------------------------------- HPI Details Patient Name: Date of Service: Joan Officer, [name] 10/15/2023 9:30 [name] Medical Record Number: [id number] Patient Account Number: [id number] Date of Birth/Sex: Treating RN: 12-Jul-1966 (58 y.o. F) Primary Care Provider: PCP, [name]: Referring Provider: Treating Provider/Extender: [name] in Treatment: 15 History of Present Illness HPI Description: ADMISSION 07/02/2023 ***ABIs non-compressible bilaterally; triphasic Doppler signals*** This is [name] 58 year old morbidly obese patient who is not [name] diabetic. She does have hypertension and CHF. Although she does not carry this diagnosis in her electronic medical record, she clearly has lymphedema. She has multiple wounds on her bilateral lower extremities, 1 of which has been present for 3 years. She has been treating her wounds with peroxide and bacitracin. Her provider has given her various courses of antibiotics, but the wounds have not closed. She was told not to wear compression stockings due to her history of DVT but this was well over [name] year ago. She has never worn lymphedema pumps. , 07/09/2023: Several of the smaller wounds have closed. Remaining wounds include the left proximal medial ulcer and the left lateral  distal ulcer, as well as the paired wounds on her right lateral lower leg. There is slough accumulation at all sites. 07/16/2023: The left distal lateral leg ulcer is [name] little bit smaller and quite [name] bit cleaner. The more proximal medial left lower leg ulcer still has [name] fair amount of nonviable tissue present and this more tender. Her wrap did slide down and it appears that it came to  rest right in the middle of this wound causing some pressure on the site. The paired right lateral lower leg ulcers are small and superficial with [name] little bit of slough and eschar present. 07/23/2023: The right lateral lower leg ulcers are nearly closed but remain just barely open underneath [name] layer of eschar. The proximal medial left lower leg ulcer has less nonviable tissue present today and is smaller. The left distal lateral leg ulcer is unchanged in size, but the surface is clean and the quality of the tissue is improving. 07/30/2023: Both right leg ulcers are healed. The proximal medial left leg ulcer is smaller and more superficial. The left distal lateral leg ulcer is still unchanged in size. There is more granulation tissue filling in, but the surface is still fairly fibrotic. Edema control is adequate. 08/07/2023: She unfortunately had to travel out of town urgently to go to [place] this past week and as [name] result, was on her feet and with her legs in [name] dependent position quite [name] bit. This has resulted in the medial leg ulcer getting [name] little bit larger and the lateral leg ulcer getting [name] little bit deeper. Her juxta lite stockings were delivered and she has them with her today. She has been approved for Epifix so we will proceed with application today. 08/13/2023: The medial leg ulcer is quite [name] bit smaller this week and the lateral leg ulcer also measured slightly smaller but the tissue surface is healthier- looking. She has been on her feet again [name] lot this past week so her edema control remains  poor. 08/20/2023: The medial leg ulcer is nearly healed with just [name] tiny open area remaining underneath [name] layer of eschar. The lateral leg ulcer is unchanged and the surface remains [name] bit fibrotic. Edema control is better this week. 12/3; the medial ulcer is healed. On the left lateral we still have [name] punched-out area with roughly 0.4 cm in depth. The granulation tissue at the base of this looks healthy. EpiFix reapplied in the [name] fashion. 12/12; the patient's medial left leg ulcer was healed however her Unna boot fell down this week and she has [name] new wound on the upper medial lower leg in the setting of uncontrolled swelling. The wound that we she had last week where we are putting epi fix is about the same but the swelling is just too much. She did not come in with any stocking on the right leg 09/14/2023: The medial leg ulcer is [name] little bit smaller today. The lateral leg ulcer continues to contract. There is still some depth to it and [name] thick layer of slough has accumulated. 09/27/2023: The medial leg ulcer is healed. The lateral leg ulcer is smaller today. There is still some depth to it but not much slough accumulation. Edema control is significantly improved. She is wearing her compression stocking on her right leg. 10/03/2023: The lateral leg ulcer continues to contract. There is [name] small amount of undermining at the more proximal aspect. Light slough on the surface. Edema control remains very good. She says that her lymphedema pumps are supposed to be delivered within the week. 10/08/2023: The ulcer measured the same today, but to my eye, it appears both shallower and [name] little bit smaller. Minimal slough on the surface. Edema control remains good. She has her lymphedema pumps and somebody from the company is coming out to show her how to use them this week. 10/15/2023: The ulcer is smaller today with good perimeter  epithelialization. There is [name] little bit of slough on the surface and some  eschar around the edges. Edema control is good. She has been using her lymphedema pumps at home. [name], [name] ([id number]) [id number] Page 3 of 8 Electronic Signature(s) Signed: 10/15/2023 10:18:18 AM By: Joan Guess MD FACS Entered By: [name] on 10/15/2023 10:18:18 -------------------------------------------------------------------------------- Physical Exam Details Patient Name: Date of Service: Joan Officer, [name] 10/15/2023 9:30 [name] Medical Record Number: [id number] Patient Account Number: [id number] Date of Birth/Sex: Treating RN: [birth year] (58 y.o. F) Primary Care Provider: PCP, [name]: Referring Provider: Treating Provider/Extender: [name] in Treatment: 15 Constitutional Hypertensive, asymptomatic. Tachycardic, asymptomatic. . . no acute distress. Respiratory Normal work of breathing on room air.. Notes 10/15/2023: The ulcer is smaller today with good perimeter epithelialization. There is [name] little bit of slough on the surface and some eschar around the edges. Edema control is good. . Electronic Signature(s) Signed: 10/15/2023 10:19:03 AM By: Joan Guess MD FACS Entered By: [name] on 10/15/2023 10:19:03 -------------------------------------------------------------------------------- Physician Orders Details Patient Name: Date of Service: Joan Officer, [name] 10/15/2023 9:30 [name] Medical Record Number: [id number] Patient Account Number: [id number] Date of Birth/Sex: Treating RN: 1966/05/09 (58 y.o. [name] Primary Care Provider: PCP, [name]: Referring Provider: Treating Provider/Extender: [name] in Treatment: 15 The following information was scribed by: [name] The information was scribed for: [name] Verbal / Phone Orders: No Diagnosis Coding ICD-10 Coding Code Description [phone] Non-pressure chronic ulcer of other part of left lower  leg with fat layer exposed I87.319 Chronic venous hypertension (idiopathic) with ulcer of unspecified lower extremity I89.0 Lymphedema, not elsewhere classified I50.32 Chronic diastolic (congestive) heart failure E66.01 Morbid (severe) obesity due to excess calories Follow-up Appointments ppointment in 1 week. - Dr. Lady Gary Return [name] 1/27 @ 09:15 am Anesthetic (In clinic) Topical Lidocaine 4% applied to wound bed Cellular or Tissue Based Products [name], [name] ([id number]) [id number] Page 4 of 8 Wound #6 Left,Lateral Lower Leg Cellular or Tissue Based Product Type: - Epifix #8 applied 10/03/23 Cellular or Tissue Based Product applied to wound bed, secured with steri-strips, cover with Adaptic or Mepitel. (DO NOT REMOVE). Bathing/ Shower/ Hygiene May shower with protection but do not get wound dressing(s) wet. Protect dressing(s) with water repellant cover (for example, large plastic bag) or [name] cast cover and may then take shower. - Keep legs dry until next appointment. Use Cast Protectors if so desired. Additional Orders / Instructions Follow Nutritious Diet - Try to increase protein intake. The goal is 70g-100g per day. Examples are chicken, fish, meat, pork, Greek yogurt, eggs etc. Lymphedema Treatment Plan - Exercise, Compression and Elevation Exercise daily as tolerated. (Walking, ROM, Calf Pumps and Toe Taps) Compression Garments, Class II. Wear daily when out of bed. May remove at bedtime - right leg Compression Wraps as ordered Elevate legs 30 - 60 minutes at or above heart level at least 3 - 4 times daily as able/tolerated Avoid standing for long periods and elevate leg(s) parallel to the floor when sitting Use Pneumatic Compression Device on leg(s) 2-3 times [name] day for 45-60 minutes - grade 2 lymphedema with skin changes and chronic edema Wound Treatment Wound #6 - Lower Leg Wound Laterality: Left, Lateral Peri-Wound Care: Sween Lotion (Moisturizing  lotion) 1 x Per Week Discharge Instructions: Apply moisturizing lotion as directed Prim Dressing: Promogran Prisma Matrix, 4.34 (sq in) (silver collagen) 1 x Per Week ary Discharge Instructions:  Moisten collagen with saline or hydrogel Secondary Dressing: Drawtex 4x4 in 1 x Per Week Discharge Instructions: Apply over primary dressing as directed. Secondary Dressing: Woven Gauze Sponge, Non-Sterile 4x4 in 1 x Per Week Discharge Instructions: Apply over primary dressing as directed. Compression Wrap: Unnaboot w/Calamine, 4x10 (in/yd) 1 x Per Week Discharge Instructions: Apply Unnaboot as directed. Electronic Signature(s) Signed: 10/15/2023 2:59:12 PM By: Joan Guess MD FACS Entered By: [name] on 10/15/2023 10:22:34 -------------------------------------------------------------------------------- Problem List Details Patient Name: Date of Service: Joan Officer, [name] 10/15/2023 9:30 [name] Medical Record Number: [id number] Patient Account Number: [id number] Date of Birth/Sex: Treating RN: Feb 18, 1966 (57 y.o. [name] Primary Care Provider: PCP, [name]: Referring Provider: Treating Provider/Extender: [name] in Treatment: 15 Active Problems ICD-10 Encounter Code Description Active Date MDM Diagnosis L97.822 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osed10/03/2023 No Yes I87.319 Chronic venous hypertension (idiopathic) with ulcer of unspecified lower 07/02/2023 No Yes extremity I89.0 Lymphedema, not elsewhere classified 07/02/2023 No Yes [name], [name] ([id number]) [id number] Page 5 of 8 I50.32 Chronic diastolic (congestive) heart failure 07/02/2023 No Yes E66.01 Morbid (severe) obesity due to excess calories 07/02/2023 No Yes Inactive Problems Resolved Problems ICD-10 Code Description Active Date Resolved Date L97.812 Non-pressure chronic ulcer of other part of right lower leg  with fat layer exposed 07/02/2023 07/02/2023 Electronic Signature(s) Signed: 10/15/2023 10:16:30 AM By: Joan Guess MD FACS Entered By: [name] on 10/15/2023 10:16:30 -------------------------------------------------------------------------------- Progress Note Details Patient Name: Date of Service: Joan Officer, [name] 10/15/2023 9:30 [name] Medical Record Number: [id number] Patient Account Number: [id number] Date of Birth/Sex: Treating RN: January 08, 1966 (58 y.o. F) Primary Care Provider: PCP, [name]: Referring Provider: Treating Provider/Extender: [name] in Treatment: 15 Subjective Chief Complaint Information obtained from Patient Patient presents for treatment of open ulcers due to lymphedema History of Present Illness (HPI) ADMISSION 07/02/2023 ***ABIs non-compressible bilaterally; triphasic Doppler signals*** This is [name] 58 year old morbidly obese patient who is not [name] diabetic. She does have hypertension and CHF. Although she does not carry this diagnosis in her electronic medical record, she clearly has lymphedema. She has multiple wounds on her bilateral lower extremities, 1 of which has been present for 3 years. She has been treating her wounds with peroxide and bacitracin. Her provider has given her various courses of antibiotics, but the wounds have not closed. She was told not to wear compression stockings due to her history of DVT but this was well over [name] year ago. She has never worn lymphedema pumps. , 07/09/2023: Several of the smaller wounds have closed. Remaining wounds include the left proximal medial ulcer and the left lateral distal ulcer, as well as the paired wounds on her right lateral lower leg. There is slough accumulation at all sites. 07/16/2023: The left distal lateral leg ulcer is [name] little bit smaller and quite [name] bit cleaner. The more proximal medial left lower leg ulcer still has [name] fair amount of nonviable tissue present and this  more tender. Her wrap did slide down and it appears that it came to rest right in the middle of this wound causing some pressure on the site. The paired right lateral lower leg ulcers are small and superficial with [name] little bit of slough and eschar present. 07/23/2023: The right lateral lower leg ulcers are nearly closed but remain just barely open underneath [name] layer of eschar. The proximal medial left lower leg ulcer has  less nonviable tissue present today and is smaller. The left distal lateral leg ulcer is unchanged in size, but the surface is clean and the quality of the tissue is improving. 07/30/2023: Both right leg ulcers are healed. The proximal medial left leg ulcer is smaller and more superficial. The left distal lateral leg ulcer is still unchanged in size. There is more granulation tissue filling in, but the surface is still fairly fibrotic. Edema control is adequate. 08/07/2023: She unfortunately had to travel out of town urgently to go to [place] this past week and as [name] result, was on her feet and with her legs in [name] dependent position quite [name] bit. This has resulted in the medial leg ulcer getting [name] little bit larger and the lateral leg ulcer getting [name] little bit deeper. Her juxta lite stockings were delivered and she has them with her today. She has been approved for Epifix so we will proceed with application today. 08/13/2023: The medial leg ulcer is quite [name] bit smaller this week and the lateral leg ulcer also measured slightly smaller but the tissue surface is healthier- looking. She has been on her feet again [name] lot this past week so her edema control remains poor. 08/20/2023: The medial leg ulcer is nearly healed with just [name] tiny open area remaining underneath [name] layer of eschar. The lateral leg ulcer is unchanged and the [name], [name] ([id number]) [id number] Page 6 of 8 surface remains [name] bit fibrotic. Edema control is better this week. 12/3; the medial  ulcer is healed. On the left lateral we still have [name] punched-out area with roughly 0.4 cm in depth. The granulation tissue at the base of this looks healthy. EpiFix reapplied in the [name] fashion. 12/12; the patient's medial left leg ulcer was healed however her Unna boot fell down this week and she has [name] new wound on the upper medial lower leg in the setting of uncontrolled swelling. The wound that we she had last week where we are putting epi fix is about the same but the swelling is just too much. She did not come in with any stocking on the right leg 09/14/2023: The medial leg ulcer is [name] little bit smaller today. The lateral leg ulcer continues to contract. There is still some depth to it and [name] thick layer of slough has accumulated. 09/27/2023: The medial leg ulcer is healed. The lateral leg ulcer is smaller today. There is still some depth to it but not much slough accumulation. Edema control is significantly improved. She is wearing her compression stocking on her right leg. 10/03/2023: The lateral leg ulcer continues to contract. There is [name] small amount of undermining at the more proximal aspect. Light slough on the surface. Edema control remains very good. She says that her lymphedema pumps are supposed to be delivered within the week. 10/08/2023: The ulcer measured the same today, but to my eye, it appears both shallower and [name] little bit smaller. Minimal slough on the surface. Edema control remains good. She has her lymphedema pumps and somebody from the company is coming out to show her how to use them this week. 10/15/2023: The ulcer is smaller today with good perimeter epithelialization. There is [name] little bit of slough on the surface and some eschar around the edges. Edema control is good. She has been using her lymphedema pumps at home. Objective Constitutional Hypertensive, asymptomatic. Tachycardic, asymptomatic. no acute distress. Vitals Time Taken: 9:51 AM, Height: 71 in, Weight: 352  lbs, BMI: 49.1,  Temperature: 98.2 F, Pulse: 116 bpm, Respiratory Rate: 20 breaths/min, Blood Pressure: 172/105 mmHg. Respiratory Normal work of breathing on room air.. General Notes: 10/15/2023: The ulcer is smaller today with good perimeter epithelialization. There is [name] little bit of slough on the surface and some eschar around the edges. Edema control is good. . Integumentary (Hair, Skin) Wound #6 status is Open. Original cause of wound was Gradually Appeared. The date acquired was: 08/09/2020. The wound has been in treatment 15 weeks. The wound is located on the Left,Lateral Lower Leg. The wound measures 0.7cm length x 0.5cm width x 0.2cm depth; 0.275cm^2 area and 0.055cm^3 volume. There is Fat Layer (Subcutaneous Tissue) exposed. There is no tunneling or undermining noted. There is [name] small amount of serosanguineous drainage noted. The wound margin is distinct with the outline attached to the wound base. There is large (67-100%) red granulation within the wound bed. There is [name] small (1-33%) amount of necrotic tissue within the wound bed including Adherent Slough. The periwound skin appearance had no abnormalities noted for texture. The periwound skin appearance had no abnormalities noted for moisture. The periwound skin appearance exhibited: Hemosiderin Staining. Periwound temperature was noted as No Abnormality. The periwound has tenderness on palpation. Assessment Active Problems ICD-10 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osed Chronic venous hypertension (idiopathic) with ulcer of unspecified lower extremity Lymphedema, not elsewhere classified Chronic diastolic (congestive) heart failure Morbid (severe) obesity due to excess calories Procedures Wound #6 Pre-procedure diagnosis of Wound #6 is [name] Lymphedema located on the Left,Lateral Lower Leg . There was [name] Excisional Skin/Subcutaneous Tissue Debridement with [name] total area of 0.27 sq cm performed by  Joan Guess, MD. With the following instrument(s): Curette to remove Viable and Non-Viable tissue/material. Material removed includes Eschar, Subcutaneous Tissue, and Slough after achieving pain control using Lidocaine 4% T opical Solution. No specimens were taken. [name] time out was conducted at 10:10, prior to the start of the procedure. [name] Minimum amount of bleeding was controlled with Pressure. The procedure was tolerated well with [name] pain level of 2 throughout and [name] pain level of 1 following the procedure. Post Debridement Measurements: 0.7cm length x 0.5cm width x 0.2cm depth; 0.055cm^3 volume. Character of Wound/Ulcer Post Debridement is improved. Post procedure Diagnosis Wound #6: Same as Pre-Procedure [name], [name] ([id number]) [id number] Page 7 of 8 Pre-procedure diagnosis of Wound #6 is [name] Lymphedema located on the Left,Lateral Lower Leg . There was [name] Radio broadcast assistant Compression Therapy Procedure by Joan Deed, RN. Post procedure Diagnosis Wound #6: Same as Pre-Procedure Plan Follow-up Appointments: Return Appointment in 1 week. - Dr. Lady Gary 1/27 @ 09:15 am Anesthetic: (In clinic) Topical Lidocaine 4% applied to wound bed Cellular or Tissue Based Products: Wound #6 Left,Lateral Lower Leg: Cellular or Tissue Based Product Type: - Epifix #8 applied 10/03/23 Cellular or Tissue Based Product applied to wound bed, secured with steri-strips, cover with Adaptic or Mepitel. (DO NOT REMOVE). Bathing/ Shower/ Hygiene: May shower with protection but do not get wound dressing(s) wet. Protect dressing(s) with water repellant cover (for example, large plastic bag) or [name] cast cover and may then take shower. - Keep legs dry until next appointment. Use Cast Protectors if so desired. Additional Orders / Instructions: Follow Nutritious Diet - Try to increase protein intake. The goal is 70g-100g per day. Examples are chicken, fish, meat, pork, Greek yogurt, eggs etc. Lymphedema  Treatment Plan - Exercise, Compression and Elevation: Exercise daily as tolerated. (Walking, ROM, Calf  Pumps and T T oe aps) Compression Garments, Class II. Wear daily when out of bed. May remove at bedtime - right leg Compression Wraps as ordered Elevate legs 30 - 60 minutes at or above heart level at least 3 - 4 times daily as able/tolerated Avoid standing for long periods and elevate leg(s) parallel to the floor when sitting Use Pneumatic Compression Device on leg(s) 2-3 times [name] day for 45-60 minutes - grade 2 lymphedema with skin changes and chronic edema WOUND #6: - Lower Leg Wound Laterality: Left, Lateral Peri-Wound Care: Sween Lotion (Moisturizing lotion) 1 x Per Week/ Discharge Instructions: Apply moisturizing lotion as directed Prim Dressing: Promogran Prisma Matrix, 4.34 (sq in) (silver collagen) 1 x Per Week/ ary Discharge Instructions: Moisten collagen with saline or hydrogel Secondary Dressing: Drawtex 4x4 in 1 x Per Week/ Discharge Instructions: Apply over primary dressing as directed. Secondary Dressing: Woven Gauze Sponge, Non-Sterile 4x4 in 1 x Per Week/ Discharge Instructions: Apply over primary dressing as directed. Com pression Wrap: Unnaboot w/Calamine, 4x10 (in/yd) 1 x Per Week/ Discharge Instructions: Apply Unnaboot as directed. 10/15/2023: The ulcer is smaller today with good perimeter epithelialization. There is [name] little bit of slough on the surface and some eschar around the edges. Edema control is good. She has been using her lymphedema pumps at home. I used [name] curette to debride eschar, slough, and subcutaneous tissue from the wound. Unfortunately, her EpiFix was not ordered (again). She did well with the Prisma silver collagen we use last week so we will go ahead and use this again. Continue calamine-based in the boot. She will continue to use her lymphedema pumps on [name] regular basis at home. Follow-up in 1 week. Electronic Signature(s) Signed: 10/15/2023 10:23:31  AM By: Joan Guess MD FACS Entered By: [name] on 10/15/2023 10:23:31 -------------------------------------------------------------------------------- SuperBill Details Patient Name: Date of Service: Joan Officer, [name] 10/15/2023 Medical Record Number: [id number] Patient Account Number: [id number] Date of Birth/Sex: Treating RN: 1966/04/02 (58 y.o. F) Primary Care Provider: PCP, [name]: Referring Provider: Treating Provider/Extender: [name] in Treatment: 15 Diagnosis Coding ICD-10 Codes Code Description [phone] 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osed [name], [name] ([id number]) [id number].pdf Page 8 of 8 I87.319 Chronic venous hypertension (idiopathic) with ulcer of unspecified lower extremity I89.0 Lymphedema, not elsewhere classified I50.32 Chronic diastolic (congestive) heart failure E66.01 Morbid (severe) obesity due to excess calories Facility Procedures : CPT4 Code: [id number] 1 Description: 1042 - DEB SUBQ TISSUE 20 SQ CM/< ICD-10 Diagnosis Description L97.822 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 I87.319 Chronic venous hypertension (idiopathic) with ulcer of unspecified lower extr I89.0  Lymphedema, not elsewhere classified I50.32 Chronic diastolic (congestive) heart failure Modifier: osed emity Quantity: 1 Physician Procedures : CPT4 Code Description Modifier [id number] 11042 - WC PHYS SUBQ TISS 20 SQ CM ICD-10 Diagnosis Description L97.822 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osed I87.319 Chronic venous hypertension (idiopathic) with ulcer  of unspecified lower extremity I89.0 Lymphedema, not elsewhere classified I50.32 Chronic diastolic (congestive) heart failure Quantity: 1 Electronic Signature(s) Signed: 10/15/2023 10:28:34 AM By: Joan Guess MD FACS Entered By: [name] on  10/15/2023 10:28:33

## 2023-10-22 ENCOUNTER — Encounter (HOSPITAL_BASED_OUTPATIENT_CLINIC_OR_DEPARTMENT_OTHER): Payer: Medicare HMO | Admitting: General Surgery

## 2023-10-22 DIAGNOSIS — I87312 Chronic venous hypertension (idiopathic) with ulcer of left lower extremity: Secondary | ICD-10-CM | POA: Diagnosis not present

## 2023-10-29 ENCOUNTER — Ambulatory Visit (HOSPITAL_BASED_OUTPATIENT_CLINIC_OR_DEPARTMENT_OTHER): Payer: Medicare HMO | Admitting: General Surgery

## 2023-11-05 ENCOUNTER — Ambulatory Visit (HOSPITAL_BASED_OUTPATIENT_CLINIC_OR_DEPARTMENT_OTHER): Payer: Medicare HMO | Admitting: General Surgery

## 2023-11-12 ENCOUNTER — Ambulatory Visit (HOSPITAL_BASED_OUTPATIENT_CLINIC_OR_DEPARTMENT_OTHER): Payer: Medicare HMO | Admitting: General Surgery

## 2023-11-19 ENCOUNTER — Ambulatory Visit (HOSPITAL_BASED_OUTPATIENT_CLINIC_OR_DEPARTMENT_OTHER): Payer: Medicare HMO | Admitting: General Surgery

## 2024-01-14 ENCOUNTER — Encounter: Payer: Self-pay | Admitting: Hematology

## 2024-01-14 ENCOUNTER — Other Ambulatory Visit (HOSPITAL_COMMUNITY): Payer: Self-pay

## 2024-01-14 MED ORDER — MOUNJARO 7.5 MG/0.5ML ~~LOC~~ SOAJ
7.5000 mg | SUBCUTANEOUS | 1 refills | Status: AC
  Filled 2024-01-14: qty 2, 28d supply, fill #0

## 2024-02-08 ENCOUNTER — Other Ambulatory Visit (HOSPITAL_COMMUNITY): Payer: Self-pay

## 2024-03-24 ENCOUNTER — Other Ambulatory Visit (HOSPITAL_COMMUNITY): Payer: Self-pay

## 2024-05-05 ENCOUNTER — Other Ambulatory Visit: Payer: Self-pay | Admitting: *Deleted

## 2024-08-07 ENCOUNTER — Other Ambulatory Visit: Payer: Self-pay | Admitting: Nurse Practitioner

## 2024-08-07 DIAGNOSIS — Z1231 Encounter for screening mammogram for malignant neoplasm of breast: Secondary | ICD-10-CM

## 2024-09-19 ENCOUNTER — Ambulatory Visit

## 2024-09-23 ENCOUNTER — Other Ambulatory Visit: Payer: Self-pay

## 2024-09-30 ENCOUNTER — Inpatient Hospital Stay
Admission: RE | Admit: 2024-09-30 | Discharge: 2024-09-30 | Attending: Nurse Practitioner | Admitting: Nurse Practitioner

## 2024-09-30 DIAGNOSIS — Z1231 Encounter for screening mammogram for malignant neoplasm of breast: Secondary | ICD-10-CM
